# Patient Record
Sex: Male | Born: 1965 | Race: White | Hispanic: No | Smoking: Never smoker
Health system: Southern US, Community
[De-identification: ages and names within clinical notes are randomized; demographics above are authoritative.]

## PROBLEM LIST (undated history)

## (undated) DIAGNOSIS — N189 Chronic kidney disease, unspecified: Secondary | ICD-10-CM

## (undated) DIAGNOSIS — J189 Pneumonia, unspecified organism: Secondary | ICD-10-CM

## (undated) DIAGNOSIS — E119 Type 2 diabetes mellitus without complications: Secondary | ICD-10-CM

## (undated) DIAGNOSIS — E785 Hyperlipidemia, unspecified: Secondary | ICD-10-CM

## (undated) DIAGNOSIS — I10 Essential (primary) hypertension: Secondary | ICD-10-CM

## (undated) DIAGNOSIS — H547 Unspecified visual loss: Secondary | ICD-10-CM

## (undated) DIAGNOSIS — I350 Nonrheumatic aortic (valve) stenosis: Secondary | ICD-10-CM

## (undated) HISTORY — DX: Chronic kidney disease, unspecified: N18.9

## (undated) HISTORY — PX: FRACTURE SURGERY: SHX138

---

## 1985-01-04 HISTORY — PX: ANKLE CLOSED REDUCTION: SHX880

## 2002-05-24 ENCOUNTER — Emergency Department (HOSPITAL_COMMUNITY): Admission: EM | Admit: 2002-05-24 | Discharge: 2002-05-24 | Payer: Self-pay | Admitting: Emergency Medicine

## 2002-05-25 ENCOUNTER — Emergency Department (HOSPITAL_COMMUNITY): Admission: EM | Admit: 2002-05-25 | Discharge: 2002-05-25 | Payer: Self-pay | Admitting: Emergency Medicine

## 2013-01-05 ENCOUNTER — Encounter: Payer: Self-pay | Admitting: Family Medicine

## 2013-01-05 ENCOUNTER — Ambulatory Visit (INDEPENDENT_AMBULATORY_CARE_PROVIDER_SITE_OTHER): Payer: 59 | Admitting: Family Medicine

## 2013-01-05 VITALS — BP 146/90 | Ht 68.5 in

## 2013-01-05 DIAGNOSIS — E785 Hyperlipidemia, unspecified: Secondary | ICD-10-CM | POA: Insufficient documentation

## 2013-01-05 DIAGNOSIS — E118 Type 2 diabetes mellitus with unspecified complications: Secondary | ICD-10-CM | POA: Insufficient documentation

## 2013-01-05 DIAGNOSIS — E782 Mixed hyperlipidemia: Secondary | ICD-10-CM | POA: Insufficient documentation

## 2013-01-05 DIAGNOSIS — IMO0002 Reserved for concepts with insufficient information to code with codable children: Secondary | ICD-10-CM

## 2013-01-05 DIAGNOSIS — IMO0001 Reserved for inherently not codable concepts without codable children: Secondary | ICD-10-CM

## 2013-01-05 DIAGNOSIS — Z0000 Encounter for general adult medical examination without abnormal findings: Secondary | ICD-10-CM

## 2013-01-05 DIAGNOSIS — E119 Type 2 diabetes mellitus without complications: Secondary | ICD-10-CM

## 2013-01-05 DIAGNOSIS — I10 Essential (primary) hypertension: Secondary | ICD-10-CM | POA: Insufficient documentation

## 2013-01-05 DIAGNOSIS — Z23 Encounter for immunization: Secondary | ICD-10-CM

## 2013-01-05 DIAGNOSIS — E1165 Type 2 diabetes mellitus with hyperglycemia: Secondary | ICD-10-CM | POA: Insufficient documentation

## 2013-01-05 MED ORDER — CITALOPRAM HYDROBROMIDE 20 MG PO TABS: 20.0000 mg | ORAL_TABLET | Freq: Every day | ORAL | Status: DC

## 2013-01-05 MED ORDER — LISINOPRIL-HYDROCHLOROTHIAZIDE 20-12.5 MG PO TABS: 1.0000 | ORAL_TABLET | Freq: Every day | ORAL | Status: DC

## 2013-01-05 MED ORDER — METFORMIN HCL 500 MG PO TABS: 500.0000 mg | ORAL_TABLET | Freq: Two times a day (BID) | ORAL | Status: DC

## 2013-01-05 MED ORDER — GLYBURIDE 5 MG PO TABS: 5.0000 mg | ORAL_TABLET | Freq: Two times a day (BID) | ORAL | Status: DC

## 2013-01-05 MED ORDER — CEPHALEXIN 500 MG PO CAPS: 500.0000 mg | ORAL_CAPSULE | Freq: Four times a day (QID) | ORAL | Status: AC

## 2013-01-05 NOTE — Patient Instructions (Signed)
Diabetes Meal Planning Guide The diabetes meal planning guide is a tool to help you plan your meals and snacks. It is important for people with diabetes to manage their blood glucose (sugar) levels. Choosing the right foods and the right amounts throughout your day will help control your blood glucose. Eating right can even help you improve your blood pressure and reach or maintain a healthy weight. CARBOHYDRATE COUNTING MADE EASY When you eat carbohydrates, they turn to sugar. This raises your blood glucose level. Counting carbohydrates can help you control this level so you feel better. When you plan your meals by counting carbohydrates, you can have more flexibility in what you eat and balance your medicine with your food intake. Carbohydrate counting simply means adding up the total amount of carbohydrate grams in your meals and snacks. Try to eat about the same amount at each meal. Foods with carbohydrates are listed below. Each portion below is 1 carbohydrate serving or 15 grams of carbohydrates. Ask your dietician how many grams of carbohydrates you should eat at each meal or snack. Grains and Starches  1 slice bread.   English muffin or hotdog/hamburger bun.   cup cold cereal (unsweetened).   cup cooked pasta or rice.   cup starchy vegetables (corn, potatoes, peas, beans, winter squash).  1 tortilla (6 inches).   bagel.  1 waffle or pancake (size of a CD).   cup cooked cereal.  4 to 6 small crackers. *Whole grain is recommended. Fruit  1 cup fresh unsweetened berries, melon, papaya, pineapple.  1 small fresh fruit.   banana or mango.   cup fruit juice (4 oz unsweetened).   cup canned fruit in natural juice or water.  2 tbs dried fruit.  12 to 15 grapes or cherries. Milk and Yogurt  1 cup fat-free or 1% milk.  1 cup soy milk.  6 oz light yogurt with sugar-free sweetener.  6 oz low-fat soy yogurt.  6 oz plain yogurt. Vegetables  1 cup raw or  cup  cooked is counted as 0 carbohydrates or a "free" food.  If you eat 3 or more servings at 1 meal, count them as 1 carbohydrate serving. Other Carbohydrates   oz chips or pretzels.   cup ice cream or frozen yogurt.   cup sherbet or sorbet.  2 inch square cake, no frosting.  1 tbs honey, sugar, jam, jelly, or syrup.  2 small cookies.  3 squares of graham crackers.  3 cups popcorn.  6 crackers.  1 cup broth-based soup.  Count 1 cup casserole or other mixed foods as 2 carbohydrate servings.  Foods with less than 20 calories in a serving may be counted as 0 carbohydrates or a "free" food. You may want to purchase a book or computer software that lists the carbohydrate gram counts of different foods. In addition, the nutrition facts panel on the labels of the foods you eat are a good source of this information. The label will tell you how big the serving size is and the total number of carbohydrate grams you will be eating per serving. Divide this number by 15 to obtain the number of carbohydrate servings in a portion. Remember, 1 carbohydrate serving equals 15 grams of carbohydrate. SERVING SIZES Measuring foods and serving sizes helps you make sure you are getting the right amount of food. The list below tells how big or small some common serving sizes are.  1 oz.........4 stacked dice.  3 oz.........Deck of cards.  1 tsp........Tip   of little finger.  1 tbs......Marland KitchenMarland KitchenThumb.  2 tbs.......Marland KitchenGolf ball.   cup......Marland KitchenHalf of a fist.  1 cup.......Marland KitchenA fist. SAMPLE DIABETES MEAL PLAN Below is a sample meal plan that includes foods from the grain and starches, dairy, vegetable, fruit, and meat groups. A dietician can individualize a meal plan to fit your calorie needs and tell you the number of servings needed from each food group. However, controlling the total amount of carbohydrates in your meal or snack is more important than making sure you include all of the food groups at every  meal. You may interchange carbohydrate containing foods (dairy, starches, and fruits). The meal plan below is an example of a 2000 calorie diet using carbohydrate counting. This meal plan has 17 carbohydrate servings. Breakfast  1 cup oatmeal (2 carb servings).   cup light yogurt (1 carb serving).  1 cup blueberries (1 carb serving).   cup almonds. Snack  1 large apple (2 carb servings).  1 low-fat string cheese stick. Lunch  Chicken breast salad.  1 cup spinach.   cup chopped tomatoes.  2 oz chicken breast, sliced.  2 tbs low-fat [place] dressing.  12 whole-wheat crackers (2 carb servings).  12 to 15 grapes (1 carb serving).  1 cup low-fat milk (1 carb serving). Snack  1 cup carrots.   cup hummus (1 carb serving). Dinner  3 oz broiled salmon.  1 cup brown rice (3 carb servings). Snack  1  cups steamed broccoli (1 carb serving) drizzled with 1 tsp olive oil and lemon juice.  1 cup light pudding (2 carb servings). DIABETES MEAL PLANNING WORKSHEET Your dietician can use this worksheet to help you decide how many servings of foods and what types of foods are right for you.  BREAKFAST Food Group and Servings / Carb Servings Grain/Starches __________________________________ Dairy __________________________________________ Vegetable ______________________________________ Fruit ___________________________________________ Meat __________________________________________ Fat ____________________________________________ LUNCH Food Group and Servings / Carb Servings Grain/Starches ___________________________________ Dairy ___________________________________________ Fruit ____________________________________________ Meat ___________________________________________ Fat _____________________________________________ Paul Gonzalez Food Group and Servings / Carb Servings Grain/Starches ___________________________________ Dairy  ___________________________________________ Fruit ____________________________________________ Meat ___________________________________________ Fat _____________________________________________ SNACKS Food Group and Servings / Carb Servings Grain/Starches ___________________________________ Dairy ___________________________________________ Vegetable _______________________________________ Fruit ____________________________________________ Meat ___________________________________________ Fat _____________________________________________ DAILY TOTALS Starches _________________________ Vegetable ________________________ Fruit ____________________________ Dairy ____________________________ Meat ____________________________ Fat ______________________________ Document Released: 09/17/2004 Document Revised: 03/15/2011 Document Reviewed: 07/29/2008 [employer] Patient Information 2014 [employer] Diets for Diabetes, Food Labeling Look at food labels to help you decide how much of a product you can eat. You will want to check the amount of total carbohydrate in a serving to see how the food fits into your meal plan. In the list of ingredients, the ingredient present in the largest amount by weight must be listed first, followed by the other ingredients in descending order. STANDARD OF IDENTITY Most products have a list of ingredients. However, foods that the Food and Drug Administration (FDA) has given a standard of identity do not need a list of ingredients. A standard of identity means that a food must contain certain ingredients if it is called a particular name. Examples are mayonnaise, peanut butter, ketchup, jelly, and cheese. LABELING TERMS There are many terms found on food labels. Some of these terms have specific definitions. Some terms are regulated by the FDA, and the FDA has clearly specified how they can be used. Others are not regulated or well-defined and can be misleading and  confusing. SPECIFICALLY DEFINED TERMS Nutritive Sweetener.  A sweetener that contains calories,such as table sugar or  honey. Nonnutritive Sweetener.  A sweetener with few or no calories,such as saccharin, aspartame, sucralose, and cyclamate. LABELING TERMS REGULATED BY THE FDA Free.  The product contains only a tiny or small amount of fat, cholesterol, sodium, sugar, or calories. For example, a "fat-free" product will contain less than 0.5 g of fat per serving. Low.  A food described as "low" in fat, saturated fat, cholesterol, sodium, or calories could be eaten fairly often without exceeding dietary guidelines. For example, "low in fat" means no more than 3 g of fat per serving. Lean.  "Lean" and "extra lean" are U.S. Department of Agriculture Scientist, research (physical sciences)) terms for use on meat and poultry products. "Lean" means the product contains less than 10 g of fat, 4 g of saturated fat, and 95 mg of cholesterol per serving. "Lean" is not as low in fat as a product labeled "low." Extra Lean.  "Extra lean" means the product contains less than 5 g of fat, 2 g of saturated fat, and 95 mg of cholesterol per serving. While "extra lean" has less fat than "lean," it is still higher in fat than a product labeled "low." Reduced, Less, Fewer.  A diet product that contains 25% less of a nutrient or calories than the regular version. For example, hot dogs might be labeled "25% less fat than our regular hot dogs." Light/Lite.  A diet product that contains  fewer calories or  the fat of the original. For example, "light in sodium" means a product with  the usual sodium. More.  One serving contains at least 10% more of the daily value of a vitamin, mineral, or fiber than usual. Good Source Of.  One serving contains 10% to 19% of the daily value for a particular vitamin, mineral, or fiber. Excellent Source Of.  One serving contains 20% or more of the daily value for a particular nutrient. Other terms used might  be "high in" or "rich in." Enriched or Fortified.  The product contains added vitamins, minerals, or protein. Nutrition labeling must be used on enriched or fortified foods. Imitation.  The product has been altered so that it is lower in protein, vitamins, or minerals than the usual food,such as imitation peanut butter. Total Fat.  The number listed is the total of all fat found in a serving of the product. Under total fat, food labels must list saturated fat and trans fat, which are associated with raising bad cholesterol and an increased risk of heart blood vessel disease. Saturated Fat.  Mainly fats from animal-based sources. Some examples are red meat, cheese, cream, whole milk, and coconut oil. Trans Fat.  Found in some fried snack foods, packaged foods, and fried restaurant foods. It is recommended you eat as close to 0 g of trans fat as possible, since it raises bad cholesterol and lowers good cholesterol. Polyunsaturated and Monounsaturated Fats.  More healthful fats. These fats are from plant sources. Total Carbohydrate.  The number of carbohydrate grams in a serving of the product. Under total carbohydrate are listed the other carbohydrate sources, such as dietary fiber and sugars. Dietary Fiber.  A carbohydrate from plant sources. Sugars.  Sugars listed on the label contain all naturally occurring sugars as well as added sugars. LABELING TERMS NOT REGULATED BY THE FDA Sugarless.  Table sugar (sucrose) has not been added. However, the manufacturer may use another form of sugar in place of sucrose to sweeten the product. For example, sugar alcohols are used to sweeten foods. Sugar alcohols are a form  of sugar but are not table sugar. If a product contains sugar alcohols in place of sucrose, it can still be labeled "sugarless." Low Salt, Salt-Free, Unsalted, No Salt, No Salt Added, Without Added Salt.  Food that is usually processed with salt has been made without salt.  However, the food may contain sodium-containing additives, such as preservatives, leavening agents, or flavorings. Natural.  This term has no legal meaning. Organic.  Foods that are certified as organic have been inspected and approved by the USDA to ensure they are produced without pesticides, fertilizers containing synthetic ingredients, bioengineering, or ionizing radiation. Document Released: 12/24/2002 Document Revised: 03/15/2011 Document Reviewed: 07/11/2008 [facility] Patient Information 2014 [place], [place].

## 2013-01-05 NOTE — Progress Notes (Signed)
   Subjective:    Patient ID: Paul Gonzalez, male    DOB: 06-16-1965, 48 y.o.   MRN: [id number]  HPI Patient arrives for an annual physical. The patient comes in today for a wellness visit.  A review of their health history was completed.  A review of medications was also completed. Any necessary refills were discussed. Sensible healthy diet was discussed. Importance of minimizing excessive salt and carbohydrates was also discussed. Safety was stressed including driving, activities at work and at home where applicable. Importance of regular physical activity for overall health was discussed. Preventative measures appropriate for age were discussed. Time was spent with the patient discussing any concerns they have about their well-being. Patient also states he's been off his medicine for several months he relates that his sugars have been running high he does state that he would like to go ahead with getting back on his medicines. He states cost of doctor visits have impeded his ability to come. He denies any other particular troubles currently. He denies any chest tightness pressure pain shortness breath rectal bleeding hematuria.  Review of Systems  Constitutional: Negative for fever, activity change, appetite change and fatigue.  HENT: Negative for congestion and rhinorrhea.   Eyes: Negative for discharge.  Respiratory: Negative for cough and wheezing.   Cardiovascular: Negative for chest pain.  Gastrointestinal: Negative for vomiting, abdominal pain and blood in stool.  Endocrine: Positive for polydipsia. Negative for polyphagia.  Genitourinary: Negative for frequency and difficulty urinating.  Musculoskeletal: Negative for neck pain.  Skin: Negative for rash.  Allergic/Immunologic: Negative for environmental allergies and food allergies.  Neurological: Negative for weakness and headaches.  Psychiatric/Behavioral: Negative for confusion and agitation.       Objective:   Physical Exam  Constitutional: He appears well-developed and well-nourished.  HENT:  Head: Normocephalic and atraumatic.  Right Ear: External ear normal.  Left Ear: External ear normal.  Nose: Nose normal.  Mouth/Throat: Oropharynx is clear and moist.  Eyes: EOM are normal. Pupils are equal, round, and reactive to light.  Neck: Normal range of motion. Neck supple. No thyromegaly present.  Cardiovascular: Normal rate, regular rhythm and normal heart sounds.   No murmur heard. Pulmonary/Chest: Effort normal and breath sounds normal. No respiratory distress. He has no wheezes.  Abdominal: Soft. Bowel sounds are normal. He exhibits no distension and no mass. There is no tenderness.  Genitourinary: Penis normal.  Musculoskeletal: Normal range of motion. He exhibits no edema.  Lymphadenopathy:    He has no cervical adenopathy.  Neurological: He is alert. He exhibits normal muscle tone.  Skin: Skin is warm and dry. No erythema.  Psychiatric: He has a normal mood and affect. His behavior is normal. Judgment normal.          Assessment & Plan:  Wellness-safety measures dietary measures all discussed. The importance of following up for regular health checkups for his diabetes was discussed proper lab work was ordered he was also encouraged to get annual visit with the eye doctor. He was also told that lack of following medications or recommendations plus also lack of followup jeopardizes his long-term health and increases her risk of premature heart disease heart attacks strokes or death. He voiced understanding and agreed to followup in 3 months.

## 2013-01-08 ENCOUNTER — Telehealth: Payer: Self-pay | Admitting: Family Medicine

## 2013-01-08 NOTE — Telephone Encounter (Signed)
Patient needs Rx for glucose meters and strips (Accucheck)-Patient was supposed to have this at last visit.     [facility]

## 2013-01-08 NOTE — Telephone Encounter (Signed)
Med fax and patient notified

## 2013-01-09 ENCOUNTER — Other Ambulatory Visit: Payer: Self-pay

## 2013-01-09 ENCOUNTER — Observation Stay (HOSPITAL_COMMUNITY)
Admission: EM | Admit: 2013-01-09 | Discharge: 2013-01-10 | Disposition: A | Discharge status: Home / Self Care | Payer: 59 | Attending: Internal Medicine | Admitting: Internal Medicine

## 2013-01-09 ENCOUNTER — Encounter (HOSPITAL_COMMUNITY): Payer: Self-pay | Admitting: Emergency Medicine

## 2013-01-09 DIAGNOSIS — I10 Essential (primary) hypertension: Secondary | ICD-10-CM

## 2013-01-09 DIAGNOSIS — E119 Type 2 diabetes mellitus without complications: Secondary | ICD-10-CM

## 2013-01-09 DIAGNOSIS — N179 Acute kidney failure, unspecified: Secondary | ICD-10-CM

## 2013-01-09 DIAGNOSIS — N289 Disorder of kidney and ureter, unspecified: Secondary | ICD-10-CM

## 2013-01-09 DIAGNOSIS — E785 Hyperlipidemia, unspecified: Secondary | ICD-10-CM

## 2013-01-09 DIAGNOSIS — E162 Hypoglycemia, unspecified: Principal | ICD-10-CM

## 2013-01-09 HISTORY — DX: Hyperlipidemia, unspecified: E78.5

## 2013-01-09 HISTORY — DX: Type 2 diabetes mellitus without complications: E11.9

## 2013-01-09 HISTORY — DX: Essential (primary) hypertension: I10

## 2013-01-09 LAB — BASIC METABOLIC PANEL
BUN: 35 mg/dL — AB (ref 6–23)
CO2: 26 meq/L (ref 19–32)
Calcium: 9.4 mg/dL (ref 8.4–10.5)
Chloride: 94 mEq/L — ABNORMAL LOW (ref 96–112)
Creatinine, Ser: 3.02 mg/dL — ABNORMAL HIGH (ref 0.50–1.35)
GFR calc Af Amer: 27 mL/min — ABNORMAL LOW (ref >90)
GFR calc non Af Amer: 23 mL/min — ABNORMAL LOW (ref >90)
GLUCOSE: 91 mg/dL (ref 70–99)
POTASSIUM: 4.2 meq/L (ref 3.7–5.3)
SODIUM: 134 meq/L — AB (ref 137–147)

## 2013-01-09 LAB — CBC WITH DIFFERENTIAL/PLATELET
Basophils Absolute: 0 10*3/uL (ref 0.0–0.1)
Basophils Relative: 0 % (ref 0–1)
Eosinophils Absolute: 0.2 10*3/uL (ref 0.0–0.7)
Eosinophils Relative: 2 % (ref 0–5)
HCT: 31.4 % — ABNORMAL LOW (ref 39.0–52.0)
Hemoglobin: 10.8 g/dL — ABNORMAL LOW (ref 13.0–17.0)
LYMPHS PCT: 8 % — AB (ref 12–46)
Lymphs Abs: 0.7 10*3/uL (ref 0.7–4.0)
MCH: 28.4 pg (ref 26.0–34.0)
MCHC: 34.4 g/dL (ref 30.0–36.0)
MCV: 82.6 fL (ref 78.0–100.0)
MONOS PCT: 9 % (ref 3–12)
Monocytes Absolute: 0.7 10*3/uL (ref 0.1–1.0)
NEUTROS PCT: 81 % — AB (ref 43–77)
Neutro Abs: 6.5 10*3/uL (ref 1.7–7.7)
PLATELETS: 347 10*3/uL (ref 150–400)
RBC: 3.8 MIL/uL — ABNORMAL LOW (ref 4.22–5.81)
RDW: 12.8 % (ref 11.5–15.5)
WBC: 8 10*3/uL (ref 4.0–10.5)

## 2013-01-09 LAB — URINALYSIS W MICROSCOPIC + REFLEX CULTURE
BILIRUBIN URINE: NEGATIVE
Glucose, UA: 100 mg/dL — AB
KETONES UR: NEGATIVE mg/dL
Leukocytes, UA: NEGATIVE
Nitrite: NEGATIVE
PROTEIN: 100 mg/dL — AB
Specific Gravity, Urine: 1.01 (ref 1.005–1.030)
Urobilinogen, UA: 0.2 mg/dL (ref 0.0–1.0)
pH: 6 (ref 5.0–8.0)

## 2013-01-09 LAB — SODIUM, URINE, RANDOM: Sodium, Ur: 30 mEq/L

## 2013-01-09 LAB — GLUCOSE, CAPILLARY
Glucose-Capillary: 62 mg/dL — ABNORMAL LOW (ref 70–99)
Glucose-Capillary: 101 mg/dL — ABNORMAL HIGH (ref 70–99)
Glucose-Capillary: 74 mg/dL (ref 70–99)
Glucose-Capillary: 135 mg/dL — ABNORMAL HIGH (ref 70–99)
Glucose-Capillary: 148 mg/dL — ABNORMAL HIGH (ref 70–99)

## 2013-01-09 LAB — CREATININE, URINE, RANDOM: Creatinine, Urine: 23.19 mg/dL

## 2013-01-09 MED ORDER — ACETAMINOPHEN 325 MG PO TABS: 650.0000 mg | ORAL_TABLET | Freq: Four times a day (QID) | ORAL | Status: DC | PRN

## 2013-01-09 MED ORDER — ACETAMINOPHEN 650 MG RE SUPP: 650.0000 mg | Freq: Four times a day (QID) | RECTAL | Status: DC | PRN

## 2013-01-09 MED ORDER — PNEUMOCOCCAL VAC POLYVALENT 25 MCG/0.5ML IJ INJ
0.5000 mL | INJECTION | INTRAMUSCULAR | Status: AC
  Administered 2013-01-10: 0.5 mL via INTRAMUSCULAR
  Filled 2013-01-09: qty 0.5

## 2013-01-09 MED ORDER — AMLODIPINE BESYLATE 5 MG PO TABS
10.0000 mg | ORAL_TABLET | Freq: Every day | ORAL | Status: DC
  Administered 2013-01-09 – 2013-01-10 (×2): 10 mg via ORAL
  Filled 2013-01-09 (×4): qty 2

## 2013-01-09 MED ORDER — POLYETHYLENE GLYCOL 3350 17 G PO PACK: 17.0000 g | PACK | Freq: Every day | ORAL | Status: DC | PRN

## 2013-01-09 MED ORDER — CLONIDINE HCL 0.1 MG PO TABS
0.1000 mg | ORAL_TABLET | Freq: Four times a day (QID) | ORAL | Status: DC | PRN
  Administered 2013-01-10: 0.1 mg via ORAL
  Filled 2013-01-09: qty 1

## 2013-01-09 MED ORDER — ONDANSETRON HCL 4 MG PO TABS: 4.0000 mg | ORAL_TABLET | Freq: Four times a day (QID) | ORAL | Status: DC | PRN

## 2013-01-09 MED ORDER — INSULIN ASPART 100 UNIT/ML ~~LOC~~ SOLN: 0.0000 [IU] | Freq: Three times a day (TID) | SUBCUTANEOUS | Status: DC

## 2013-01-09 MED ORDER — HYDRALAZINE HCL 25 MG PO TABS
50.0000 mg | ORAL_TABLET | Freq: Three times a day (TID) | ORAL | Status: DC
  Administered 2013-01-09 – 2013-01-10 (×2): 50 mg via ORAL
  Filled 2013-01-09: qty 1
  Filled 2013-01-09 (×2): qty 2
  Filled 2013-01-09 (×5): qty 1

## 2013-01-09 MED ORDER — SODIUM CHLORIDE 0.9 % IV SOLN
INTRAVENOUS | Status: DC
  Administered 2013-01-09: 19:00:00 via INTRAVENOUS

## 2013-01-09 MED ORDER — ONDANSETRON HCL 4 MG/2ML IJ SOLN: 4.0000 mg | Freq: Four times a day (QID) | INTRAMUSCULAR | Status: DC | PRN

## 2013-01-09 MED ORDER — HEPARIN SODIUM (PORCINE) 5000 UNIT/ML IJ SOLN
5000.0000 [IU] | Freq: Three times a day (TID) | INTRAMUSCULAR | Status: DC
  Administered 2013-01-09 – 2013-01-10 (×2): 5000 [IU] via SUBCUTANEOUS
  Filled 2013-01-09 (×2): qty 1

## 2013-01-09 MED ORDER — ATORVASTATIN CALCIUM 10 MG PO TABS
10.0000 mg | ORAL_TABLET | Freq: Every day | ORAL | Status: DC
  Administered 2013-01-09: 10 mg via ORAL
  Filled 2013-01-09 (×3): qty 1

## 2013-01-09 MED ORDER — GUAIFENESIN-DM 100-10 MG/5ML PO SYRP
5.0000 mL | ORAL_SOLUTION | ORAL | Status: DC | PRN
Start: 2013-01-09 — End: 2013-01-10

## 2013-01-09 NOTE — H&P (Signed)
[facility]                                                                                    Patient Demographics  Paul Gonzalez, is a 48 y.o. male  MRN: [id number]   DOB - [birth year]  Admit Date - 01/09/2013  Outpatient Primary MD for the patient is Paul Battiest, MD   With History of -  Past Medical History  Diagnosis Date  . Hypertension   . Diabetes mellitus without complication   . Dyslipidemia 01/09/2013  . DM2 (diabetes mellitus, type 2) 01/09/2013  . HTN (hypertension) 01/09/2013      History reviewed. No pertinent past surgical history.  in for   Chief Complaint  Patient presents with  . Near Syncope  . Hypoglycemia     HPI  Paul Gonzalez  is a 48 y.o. male, with history of type 2 diabetes mellitus, hypertension, dyslipidemia we'll been running low blood sugars for the last few days and saw his PCP a few days ago comes in to the ER after he had an episode of lightheadedness this afternoon, when EMS arrived his sugars were 35, he was brought to the ER where after intervention his sugars improved and is back to his baseline completely symptom-free. He denies any fever chills headache nausea vomiting, no focal weakness, no chest pain palpitations cough phlegm or fever. No body aches. No diarrhea no blood in stool or urine. In the ER his workup was suggested of hypoglycemia initially, poorly controlled blood pressure and acute renal failure. No baseline labs are present in the system. I was called to admit the patient for hyperglycemia, renal insufficiency and poorly controlled blood pressure.    Review of Systems  currently negative review of systems  In addition to the HPI above,   No Fever-chills, No Headache, No changes with Vision or hearing, mild lightheadedness this morning No problems swallowing food or Liquids, No Chest pain, Cough or Shortness of Breath, No Abdominal pain, No Nausea or Vommitting, Bowel movements are regular, No Blood in stool or  Urine, No dysuria, No new skin rashes or bruises, No new joints pains-aches,  No new weakness, tingling, numbness in any extremity, No recent weight gain or loss, No polyuria, polydypsia or polyphagia, No significant Mental Stressors.  A full 10 point Review of Systems was done, except as stated above, all other Review of Systems were negative.   Social History History  Substance Use Topics  . Smoking status: Never Smoker   . Smokeless tobacco: Not on file  . Alcohol Use: No      Family History No history of CAD, possible diabetes mellitus and his parents he's not sure  Prior to Admission medications   Medication Sig Start Date End Date Taking? Authorizing Provider  citalopram (CELEXA) 20 MG tablet Take 20 mg by mouth daily.   Yes Historical Provider, MD  glyBURIDE (DIABETA) 5 MG tablet Take 5 mg by mouth 2 (two) times daily.   Yes Historical Provider, MD  lisinopril-hydrochlorothiazide (PRINZIDE,ZESTORETIC) 20-12.5 MG per tablet Take 1 tablet by mouth daily.   Yes Historical Provider, MD  metFORMIN (GLUCOPHAGE) 500 MG tablet Take by mouth  2 (two) times daily with a meal.   Yes Historical Provider, MD    No Known Allergies  Physical Exam  Vitals  Blood pressure 205/97, pulse 99, temperature 98.4 F (36.9 C), temperature source Oral, resp. rate 18, SpO2 100.00%.   1. General middle-aged white male lying in bed in NAD,    2. Normal affect and insight, Not Suicidal or Homicidal, Awake Alert, Oriented X 3.  3. No F.N deficits, ALL C.Nerves Intact, Strength 5/5 all 4 extremities, Sensation intact all 4 extremities, Plantars down going.  4. Ears and Eyes appear Normal, Conjunctivae clear, PERRLA. Moist Oral Mucosa.  5. Supple Neck, No JVD, No cervical lymphadenopathy appriciated, No Carotid Bruits.  6. Symmetrical Chest wall movement, Good air movement bilaterally, CTAB.  7. RRR, No Gallops, Rubs or Murmurs, No Parasternal Heave.  8. Positive Bowel Sounds, Abdomen  Soft, Non tender, No organomegaly appriciated,No rebound -guarding or rigidity.  9.  No Cyanosis, Normal Skin Turgor, No Skin Rash or Bruise.  10. Good muscle tone,  joints appear normal , no effusions, Normal ROM.  11. No Palpable Lymph Nodes in Neck or Axillae     Data Review  CBC  Recent Labs Lab 01/09/13 1609  WBC 8.0  HGB 10.8*  HCT 31.4*  PLT 347  MCV 82.6  MCH 28.4  MCHC 34.4  RDW 12.8  LYMPHSABS 0.7  MONOABS 0.7  EOSABS 0.2  BASOSABS 0.0   ------------------------------------------------------------------------------------------------------------------  Chemistries   Recent Labs Lab 01/09/13 1609  NA 134*  K 4.2  CL 94*  CO2 26  GLUCOSE 91  BUN 35*  CREATININE 3.02*  CALCIUM 9.4   ------------------------------------------------------------------------------------------------------------------ CrCl is unknown because there is no height on file for the current visit. ------------------------------------------------------------------------------------------------------------------ No results found for this basename: TSH, T4TOTAL, FREET3, T3FREE, THYROIDAB,  in the last 72 hours   Coagulation profile No results found for this basename: INR, PROTIME,  in the last 168 hours ------------------------------------------------------------------------------------------------------------------- No results found for this basename: DDIMER,  in the last 72 hours -------------------------------------------------------------------------------------------------------------------  Cardiac Enzymes No results found for this basename: CK, CKMB, TROPONINI, MYOGLOBIN,  in the last 168 hours ------------------------------------------------------------------------------------------------------------------ No components found with this basename: POCBNP,     ---------------------------------------------------------------------------------------------------------------  Urinalysis No results found for this basename: colorurine, appearanceur, labspec, phurine, glucoseu, hgbur, bilirubinur, ketonesur, proteinur, urobilinogen, nitrite, leukocytesur    ----------------------------------------------------------------------------------------------------------------  Imaging results:   No results found.  My personal review of EKG: Rhythm NSR,  no Acute ST changes    Assessment & Plan    1. Hypoglycemia induced syncopal episode. He takes oral hypoglycemic medications in combination of renal insufficiency likely is acute on chronic he's become hypoglycemic - his oral hypoglycemic medications will be held, will monitor CBGs closely, low-dose sliding scale insulin if needed with meals, check A1c and monitor CBGs.   2. Hypertension. In poor control, mild hypertensive urgency, he currently does not remember his home medications, he potentially could be on an ACE/ARB, will place him on Norvasc, hydralazine along with as needed Catapres and monitor blood pressures.   3. Renal insufficiency. Likely acute on chronic. No baseline labs in the system. Will check UA along with urine electrolytes, IV fluids for hydration repeat BMP in the morning thereafter outpatient followup with PCP. Try to get records from PCPs office.    4. His lipidemia place on Lipitor.     Have requested pharmacy to do med rec now     DVT Prophylaxis Heparin   AM Labs Ordered, also  please review Full Orders  Family Communication: Admission, patients condition and plan of care including tests being ordered have been discussed with the patient  who indicates understanding and agree with the plan and Code Status.  Code Status Full  Likely DC to  Home  Condition Fair  Time spent in minutes : 35    Azlee Monforte K M.D on 01/09/2013 at 6:03 PM  Between 7am to  7pm - Pager - [phone]  After 7pm go to [email or web address] - password [email or web address]  And look for the night coverage person covering me after hours  [facility] Office  [phone]

## 2013-01-09 NOTE — ED Notes (Signed)
Pt comes from work via EMS after near syncopal episode. Pt's blood glucose was 35 upon EMS arrival. Pt was given oral glucose and juice to drink. Pt's glucose was 62 afterward. Pt states he "remembers everything but passed out".

## 2013-01-09 NOTE — ED Notes (Signed)
Pt given grape and orange juice. Dr Rogene Houston notified on CBG.

## 2013-01-09 NOTE — ED Provider Notes (Addendum)
CSN: [id number]     Arrival date & time 01/09/13  1511 History  This chart was scribed for Paul Kung, MD by Donato Schultz, ED Scribe. This patient was seen in room APAH2/APAH2 and the patient's care was started at 3:44 PM.      Chief Complaint  Patient presents with  . Near Syncope  . Hypoglycemia    Patient is a 48 y.o. male presenting with near-syncope and hypoglycemia. The history is provided by the patient. No language interpreter was used.  Near Syncope This is a new problem. The current episode started 1 to 2 hours ago. Pertinent negatives include no chest pain, no abdominal pain, no headaches and no shortness of breath.  Hypoglycemia Duration:  6 hours Timing:  Constant Diabetic status:  Controlled with oral medications Context: not diet changes   Associated symptoms: no dizziness, no shortness of breath, no sweats, no syncope and no vomiting    HPI Comments: Paul Gonzalez is a 48 y.o. male with a history of DM who presents to the Emergency Department complaining of hypoglycemia that started today after the patient was leaving from work.  The patient states that he had a near syncopal episode due to his hypoglycemia which was characterized by a feeling of lightheadedness.  The patient states that he had breakfast and lunch today.  He denies being on any new diet but states that he is trying to modify his eating habits to avoid gaining weight.  The patient denies taking Insulin.  He states that he took his Glucophage yesterday morning because he noticed his blood sugar was low.  He denies taking any Glucophage last night or today.  He denies taking any new medication or OTC herbal supplements.    Past Medical History  Diagnosis Date  . Hypertension   . Diabetes mellitus without complication    History reviewed. No pertinent past surgical history. History reviewed. No pertinent family history. History  Substance Use Topics  . Smoking status: Never Smoker   . Smokeless  tobacco: Not on file  . Alcohol Use: No    Review of Systems  Constitutional: Negative for fever, chills, diaphoresis and fatigue.  HENT: Negative for rhinorrhea.   Eyes: Negative for visual disturbance.  Respiratory: Negative for cough and shortness of breath.   Cardiovascular: Positive for near-syncope. Negative for chest pain, leg swelling and syncope.  Gastrointestinal: Negative for nausea, vomiting, abdominal pain and diarrhea.  Genitourinary: Negative for dysuria and hematuria.  Musculoskeletal: Negative for back pain and neck pain.  Skin: Negative for rash.  Neurological: Positive for light-headedness. Negative for dizziness, syncope, weakness, numbness and headaches.  Hematological: Does not bruise/bleed easily.  Psychiatric/Behavioral: Negative for confusion.  All other systems reviewed and are negative.    Allergies  Review of patient's allergies indicates no known allergies.  Home Medications  No current outpatient prescriptions on file.  Triage Vitals: BP 205/97  Pulse 99  Temp(Src) 98.4 F (36.9 C) (Oral)  Resp 18  SpO2 100%  Physical Exam  Nursing note and vitals reviewed. Constitutional: He is oriented to person, place, and time. He appears well-developed and well-nourished.  HENT:  Head: Normocephalic and atraumatic.  Right Ear: External ear normal.  Left Ear: External ear normal.  Mouth/Throat: Oropharynx is clear and moist.  Eyes: Conjunctivae and EOM are normal. Pupils are equal, round, and reactive to light.  Neck: Normal range of motion and phonation normal. Neck supple.  Cardiovascular: Normal rate, regular rhythm, normal heart sounds and intact  distal pulses.   No murmur heard. Pulmonary/Chest: Effort normal and breath sounds normal. No respiratory distress. He has no wheezes. He has no rales. He exhibits no bony tenderness.  Abdominal: Soft. Normal appearance and bowel sounds are normal. There is no tenderness.  Musculoskeletal: Normal range of  motion. He exhibits no edema.  No pitting edema in ankles.   Neurological: He is alert and oriented to person, place, and time. No cranial nerve deficit or sensory deficit. He exhibits normal muscle tone. Coordination normal.  Skin: Skin is warm, dry and intact.  Psychiatric: He has a normal mood and affect. His behavior is normal. Judgment and thought content normal.    ED Course  Procedures (including critical care time)  DIAGNOSTIC STUDIES: Oxygen Saturation is 100% on room air, normal by my interpretation.    COORDINATION OF CARE: 3:48 PM- Discussed monitoring the patient's blood sugar in the ED.  Discussed possible admission if the patient's blood sugar levels are unchanging or worsening.  The patient agreed to the treatment plan.    Labs Review Labs Reviewed  GLUCOSE, CAPILLARY - Abnormal; Notable for the following:    Glucose-Capillary 62 (*)    All other components within normal limits  CBC WITH DIFFERENTIAL - Abnormal; Notable for the following:    RBC 3.80 (*)    Hemoglobin 10.8 (*)    HCT 31.4 (*)    Neutrophils Relative % 81 (*)    Lymphocytes Relative 8 (*)    All other components within normal limits  BASIC METABOLIC PANEL - Abnormal; Notable for the following:    Sodium 134 (*)    Chloride 94 (*)    BUN 35 (*)    Creatinine, Ser 3.02 (*)    GFR calc non Af Amer 23 (*)    GFR calc Af Amer 27 (*)    All other components within normal limits  GLUCOSE, CAPILLARY - Abnormal; Notable for the following:    Glucose-Capillary 101 (*)    All other components within normal limits  GLUCOSE, CAPILLARY - Abnormal; Notable for the following:    Glucose-Capillary 135 (*)    All other components within normal limits  GLUCOSE, CAPILLARY   Imaging Review No results found.  EKG Interpretation   None      Date: 01/09/2013  Rate: 99  Rhythm: normal sinus rhythm  QRS Axis: normal  Intervals: normal  ST/T Wave abnormalities: nonspecific ST/T changes  Conduction  Disutrbances:none  Narrative Interpretation:   Old EKG Reviewed: none available Supple left atrial enlargement cannot rule out anterior infarct age undetermined. No old EKG for comparison posterior is artifact throughout this EKG.   MDM   1. Hypoglycemia   2. Renal insufficiency    Patient noted that his blood sugars have been running low took his last dose of oral hypoglycemic that yesterday morning today at work got hypoglycemic down to 35 has been eating. Here with some snacks and additional food blood sugar up to 135 and holding in the 100s. Renal function is abnormal creatinine is 3.02 this may explain the prolonged half-life of his oral hypoglycemics and therefore the low blood sugar. Will discuss with the hospitalist whether this requires admission or not. Patient probably could hold his oral hypoglycemics.    Hospitalist team once ago with an observation period we'll also get a pharmacy tech to confirm his home meds.  I personally performed the services described in this documentation, which was scribed in my presence. The recorded information has been  reviewed and is accurate.     Paul Kung, MD 01/09/13 [place], MD 01/09/13 [phone]

## 2013-01-10 ENCOUNTER — Encounter: Payer: Self-pay | Admitting: Family Medicine

## 2013-01-10 LAB — CBC
HCT: 27 % — ABNORMAL LOW (ref 39.0–52.0)
Hemoglobin: 9.5 g/dL — ABNORMAL LOW (ref 13.0–17.0)
MCH: 29.3 pg (ref 26.0–34.0)
MCHC: 35.2 g/dL (ref 30.0–36.0)
MCV: 83.3 fL (ref 78.0–100.0)
PLATELETS: 262 10*3/uL (ref 150–400)
RBC: 3.24 MIL/uL — AB (ref 4.22–5.81)
RDW: 12.8 % (ref 11.5–15.5)
WBC: 6.5 10*3/uL (ref 4.0–10.5)

## 2013-01-10 LAB — GLUCOSE, CAPILLARY
GLUCOSE-CAPILLARY: 84 mg/dL (ref 70–99)
Glucose-Capillary: 88 mg/dL (ref 70–99)
Glucose-Capillary: 120 mg/dL — ABNORMAL HIGH (ref 70–99)

## 2013-01-10 LAB — BASIC METABOLIC PANEL
BUN: 35 mg/dL — ABNORMAL HIGH (ref 6–23)
CALCIUM: 8.8 mg/dL (ref 8.4–10.5)
CO2: 25 mEq/L (ref 19–32)
Chloride: 106 mEq/L (ref 96–112)
Creatinine, Ser: 3 mg/dL — ABNORMAL HIGH (ref 0.50–1.35)
GFR, EST AFRICAN AMERICAN: 27 mL/min — AB (ref >90)
GFR, EST NON AFRICAN AMERICAN: 23 mL/min — AB (ref >90)
GLUCOSE: 86 mg/dL (ref 70–99)
Potassium: 3.8 mEq/L (ref 3.7–5.3)
SODIUM: 142 meq/L (ref 137–147)

## 2013-01-10 LAB — HEMOGLOBIN A1C
Hgb A1c MFr Bld: 9.3 % — ABNORMAL HIGH (ref <5.7)
MEAN PLASMA GLUCOSE: 220 mg/dL — AB (ref <117)

## 2013-01-10 LAB — OSMOLALITY: Osmolality: 286 mOsm/kg (ref 275–300)

## 2013-01-10 LAB — OSMOLALITY, URINE: OSMOLALITY UR: 138 mosm/kg — AB (ref 390–1090)

## 2013-01-10 MED ORDER — CARVEDILOL 3.125 MG PO TABS
6.2500 mg | ORAL_TABLET | Freq: Two times a day (BID) | ORAL | Status: DC
  Administered 2013-01-10: 6.25 mg via ORAL
  Filled 2013-01-10: qty 2

## 2013-01-10 MED ORDER — AMLODIPINE BESYLATE 10 MG PO TABS: 10.0000 mg | ORAL_TABLET | Freq: Every day | ORAL | Status: DC

## 2013-01-10 MED ORDER — HYDRALAZINE HCL 50 MG PO TABS: 50.0000 mg | ORAL_TABLET | Freq: Three times a day (TID) | ORAL | Status: DC

## 2013-01-10 MED ORDER — CARVEDILOL 6.25 MG PO TABS: 6.2500 mg | ORAL_TABLET | Freq: Two times a day (BID) | ORAL | Status: DC

## 2013-01-10 NOTE — Discharge Instructions (Signed)
Follow with Primary MD [name], MD in 2 days   Get CBC, CMP, checked 2 days by Primary MD and again as instructed by your Primary MD.   Accuchecks 4 times/day, Once in AM empty stomach and then before each meal. Log in all results and show them to your Prim.MD in 2 days. If any glucose reading is under 80 or above 300 call your Prim MD immidiately. Follow Low glucose instructions for glucose under 80 as instructed.   Activity: As tolerated with Full fall precautions use walker/cane & assistance as needed   Disposition Home    Diet:  Heart healthy - low carb  Check your Weight same time everyday, if you gain over 2 pounds, or you develop in leg swelling, experience more shortness of breath or chest pain, call your Primary MD immediately. Follow Cardiac Low Salt Diet and 1.8 lit/day fluid restriction.   On your next visit with her primary care physician please Get Medicines reviewed and adjusted.  Please request your Prim.MD to go over all Hospital Tests and Procedure/Radiological results at the follow up, please get all Hospital records sent to your Prim MD by signing hospital release before you go home.   If you experience worsening of your admission symptoms, develop shortness of breath, life threatening emergency, suicidal or homicidal thoughts you must seek medical attention immediately by calling 911 or calling your MD immediately  if symptoms less severe.  You Must read complete instructions/literature along with all the possible adverse reactions/side effects for all the Medicines you take and that have been prescribed to you. Take any new Medicines after you have completely understood and accpet all the possible adverse reactions/side effects.   Do not drive and provide baby sitting services if your were admitted for syncope or siezures until you have seen by Primary MD or a Neurologist and advised to do so again.  Do not drive when taking Pain medications.    Do not take  more than prescribed Pain, Sleep and Anxiety Medications  Special Instructions: If you have smoked or chewed Tobacco  in the last 2 yrs please stop smoking, stop any regular Alcohol  and or any Recreational drug use.  Wear Seat belts while driving.   Please note  You were cared for by a hospitalist during your hospital stay. If you have any questions about your discharge medications or the care you received while you were in the hospital after you are discharged, you can call the unit and asked to speak with the hospitalist on call if the hospitalist that took care of you is not available. Once you are discharged, your primary care physician will handle any further medical issues. Please note that NO REFILLS for any discharge medications will be authorized once you are discharged, as it is imperative that you return to your primary care physician (or establish a relationship with a primary care physician if you do not have one) for your aftercare needs so that they can reassess your need for medications and monitor your lab values.

## 2013-01-10 NOTE — Discharge Summary (Signed)
[facility]                                                                                   Paul Gonzalez, is a 48 y.o. male  DOB [birth year]  MRN [id number].  Admission date:  01/09/2013  Admitting Physician  Thurnell Lose, MD  Discharge Date:  01/10/2013   Primary MD  [name], MD  Recommendations for primary care physician for things to follow:   Monitor blood pressure, glycemic control, BMP closely   Admission Diagnosis  HTN (hypertension) [401.9] ARF (acute renal failure) [584.9] Hypoglycemia [251.2] Renal insufficiency [593.9] DM2 (diabetes mellitus, type 2) [250.00]  Discharge Diagnosis  hypoglycemia, chronic kidney disease stage IV, diabetes mellitus type 2, poorly controlled blood pressure, noncompliance with medications and lab work at PCP office  Principal Problem:   Hypoglycemia Active Problems:   DM2 (diabetes mellitus, type 2)   ARF (acute renal failure)   HTN (hypertension)   Dyslipidemia      Past Medical History  Diagnosis Date  . Hypertension   . Diabetes mellitus without complication   . Dyslipidemia 01/09/2013  . DM2 (diabetes mellitus, type 2) 01/09/2013  . HTN (hypertension) 01/09/2013    History reviewed. No pertinent past surgical history.   Discharge Condition: Stable   Follow-up Information   Follow up with [name], MD. Schedule an appointment as soon as possible for a visit in 2 days.   Specialty:  Family Medicine   Contact information:   [address] [place] [address] [phone]       Follow up with [facility] S, MD. Schedule an appointment as soon as possible for a visit in 1 week.   Specialty:  Nephrology   Contact information:   [address] [place] [address] [phone]         Consults obtained -    Discharge Medications      Medication List    STOP taking these medications       glyBURIDE 5 MG tablet  Commonly known as:  DIABETA      lisinopril-hydrochlorothiazide 20-12.5 MG per tablet  Commonly known as:  PRINZIDE,ZESTORETIC     metFORMIN 500 MG tablet  Commonly known as:  GLUCOPHAGE      TAKE these medications       amLODipine 10 MG tablet  Commonly known as:  NORVASC  Take 1 tablet (10 mg total) by mouth daily.     carvedilol 6.25 MG tablet  Commonly known as:  COREG  Take 1 tablet (6.25 mg total) by mouth 2 (two) times daily with a meal.     citalopram 20 MG tablet  Commonly known as:  CELEXA  Take 20 mg by mouth daily.     hydrALAZINE 50 MG tablet  Commonly known as:  APRESOLINE  Take 1 tablet (50 mg total) by mouth every 8 (eight) hours.         Diet and Activity recommendation: See Discharge Instructions below   Discharge Instructions     Follow with Primary MD [name], MD in 2 days   Get CBC, CMP, checked 2 days by Primary MD and again  as instructed by your Primary MD.   Accuchecks 4 times/day, Once in AM empty stomach and then before each meal. Log in all results and show them to your Prim.MD in 2 days. If any glucose reading is under 80 or above 300 call your Prim MD immidiately. Follow Low glucose instructions for glucose under 80 as instructed.   Activity: As tolerated with Full fall precautions use walker/cane & assistance as needed   Disposition Home    Diet:  Heart healthy - low carb  Check your Weight same time everyday, if you gain over 2 pounds, or you develop in leg swelling, experience more shortness of breath or chest pain, call your Primary MD immediately. Follow Cardiac Low Salt Diet and 1.8 lit/day fluid restriction.   On your next visit with her primary care physician please Get Medicines reviewed and adjusted.  Please request your Prim.MD to go over all Hospital Tests and Procedure/Radiological results at the follow up, please get all Hospital records sent to your Prim MD by signing hospital release before you go home.   If you experience worsening of your  admission symptoms, develop shortness of breath, life threatening emergency, suicidal or homicidal thoughts you must seek medical attention immediately by calling 911 or calling your MD immediately  if symptoms less severe.  You Must read complete instructions/literature along with all the possible adverse reactions/side effects for all the Medicines you take and that have been prescribed to you. Take any new Medicines after you have completely understood and accpet all the possible adverse reactions/side effects.   Do not drive and provide baby sitting services if your were admitted for syncope or siezures until you have seen by Primary MD or a Neurologist and advised to do so again.  Do not drive when taking Pain medications.    Do not take more than prescribed Pain, Sleep and Anxiety Medications  Special Instructions: If you have smoked or chewed Tobacco  in the last 2 yrs please stop smoking, stop any regular Alcohol  and or any Recreational drug use.  Wear Seat belts while driving.   Please note  You were cared for by a hospitalist during your hospital stay. If you have any questions about your discharge medications or the care you received while you were in the hospital after you are discharged, you can call the unit and asked to speak with the hospitalist on call if the hospitalist that took care of you is not available. Once you are discharged, your primary care physician will handle any further medical issues. Please note that NO REFILLS for any discharge medications will be authorized once you are discharged, as it is imperative that you return to your primary care physician (or establish a relationship with a primary care physician if you do not have one) for your aftercare needs so that they can reassess your need for medications and monitor your lab values.     Major procedures and Radiology Reports - PLEASE review detailed and final reports for all details, in brief -        No results found.  Micro Results      No results found for this or any previous visit (from the past 240 hour(s)).   History of present illness and  Hospital Course:     Kindly see H&P for history of present illness and admission details, please review complete Labs, Consult reports and Test reports for all details in brief Kayston Burkart, is a 48 y.o. male,  patient with history of diabetes mellitus type 2, hypertension, chronic kidney disease now stage 4-5, persistently noncompliant with lab work and medications at PCP office this was discussed with his PCP by me in detail today.  Patient with above history was admitted to the hospital after a syncopal episode caused by severe hypoglycemia, off note he is diabetic and is on oral hypoglycemics, his last blood work at PCP office was about 4 years ago showing a creatinine of 1.5, since then according to his PCP he has been very noncompliant with lab work and never got any further lab work done, he was noncompliant with his medications an intermittent basis, he now came to the ER hypoglycemic with a creatinine of 3, I think he has developed gradual renal insufficiency and now with his present renal function his previous hypoglycemic agents caused hypoglycemia.   He was treated here with holding of his oral hypoglycemics with good results, he is normoglycemic now, A1c is pending, he will be discharged home on no diabetic medications, I have adjusted his blood pressure medications for better blood pressure control, I have discontinued his lisinopril HCTZ combination in the light of renal insufficiency, after 2 L of IV fluids for hydration his renal function stays at 3 which I think is probably his new baseline. I have discussed his care with his PCP personally who will see him in the next 1-2 days, he will also follow with nephrologist in the outpatient setting within a week. This was also discussed with patient in detail it was emphasized that he  should stay compliant as he is definitely heading towards dialysis have it does not stay compliant may cause death and disability.         Today   Subjective:   Paul Gonzalez today has no headache,no chest abdominal pain,no new weakness tingling or numbness, feels much better wants to go home today.    Objective:   Blood pressure 188/81, pulse 85, temperature 97.9 F (36.6 C), temperature source Oral, resp. rate 18, height 5\' 10"  (1.778 m), weight 78.971 kg (174 lb 1.6 oz), SpO2 99.00%.   Intake/Output Summary (Last 24 hours) at 01/10/13 0948 Last data filed at 01/10/13 0908  Gross per 24 hour  Intake    240 ml  Output   1100 ml  Net   -860 ml    Exam Awake Alert, Oriented *3, No new F.N deficits, Normal affect [place].AT,PERRAL Supple Neck,No JVD, No cervical lymphadenopathy appriciated.  Symmetrical Chest wall movement, Good air movement bilaterally, CTAB RRR,No Gallops,Rubs or new Murmurs, No Parasternal Heave +ve B.Sounds, Abd Soft, Non tender, No organomegaly appriciated, No rebound -guarding or rigidity. No Cyanosis, Clubbing or edema, No new Rash or bruise  Data Review   CBC w Diff: Lab Results  Component Value Date   WBC 6.5 01/10/2013   HGB 9.5* 01/10/2013   HCT 27.0* 01/10/2013   PLT 262 01/10/2013   LYMPHOPCT 8* 01/09/2013   MONOPCT 9 01/09/2013   EOSPCT 2 01/09/2013   BASOPCT 0 01/09/2013    CMP: Lab Results  Component Value Date   NA 142 01/10/2013   K 3.8 01/10/2013   CL 106 01/10/2013   CO2 25 01/10/2013   BUN 35* 01/10/2013   CREATININE 3.00* 01/10/2013  . No results found for this basename: HGBA1C     Total Time in preparing paper work, data evaluation and todays exam - 35 minutes  Thurnell Lose M.D on 01/10/2013 at 9:48 AM  [place]  [phone]    

## 2013-01-15 ENCOUNTER — Ambulatory Visit (INDEPENDENT_AMBULATORY_CARE_PROVIDER_SITE_OTHER): Payer: 59 | Admitting: Family Medicine

## 2013-01-15 ENCOUNTER — Encounter: Payer: Self-pay | Admitting: Family Medicine

## 2013-01-15 VITALS — BP 122/78 | Ht 68.5 in | Wt 179.8 lb

## 2013-01-15 DIAGNOSIS — IMO0001 Reserved for inherently not codable concepts without codable children: Secondary | ICD-10-CM

## 2013-01-15 DIAGNOSIS — E1165 Type 2 diabetes mellitus with hyperglycemia: Secondary | ICD-10-CM

## 2013-01-15 DIAGNOSIS — IMO0002 Reserved for concepts with insufficient information to code with codable children: Secondary | ICD-10-CM

## 2013-01-15 DIAGNOSIS — E785 Hyperlipidemia, unspecified: Secondary | ICD-10-CM

## 2013-01-15 DIAGNOSIS — N179 Acute kidney failure, unspecified: Secondary | ICD-10-CM

## 2013-01-15 MED ORDER — INSULIN NPH (HUMAN) (ISOPHANE) 100 UNIT/ML ~~LOC~~ SUSP: 10.0000 [IU] | Freq: Every day | SUBCUTANEOUS | Status: DC

## 2013-01-15 NOTE — Progress Notes (Signed)
   Subjective:    Patient ID: Paul Gonzalez, male    DOB: 1965/07/18, 48 y.o.   MRN: [id number]  HPI Patient arrives for a follow up from hospital for syncope related to hypoglycemia.  Patient patient has not checked his sugars since discharge from the hospital. He was advised to check them 4 times per day.   Patient admits to noncompliance with diet and exercise until recently.  Patient had gone quite a few months without taking any diabetes medicine at all.  Patient has a history of profound noncompliance, and has failed on numerous occasions to get blood work is requested since 2010.  Patient states he has no prescription for his machine.  Patient's family notes that that the  $ whatdeductible is 3000 and he cannot afford any other interventions  Review of Systems  no further loss of consciousness no chest pain no headache     Objective:   Physical Exam   alert no apparent distress. Vitals stable. Lungs clear. Heart regular in rhythm. H&T normal.      Assessment & Plan:   Impression #1 type 2 diabetes very poor control. Noncompliance has been tremendous down through the years. #2 renal insufficiency likely a chronic component. Followup blood work still showed a creatinine of 3. Patient needs a nephrologist. Patient was advised he is facing dialysis if the numbers continue to rise. #3 hypertension stable at this time plan initiate NPH insulin 10 units each evening for now. Add 5 units in a week if still poor control. Check sugars each morning. WSL

## 2013-01-15 NOTE — Patient Instructions (Signed)
You desperately need a kidney specialist and a diabetes specialist. Be sure to keep your appointments!!!!!!!!!!!!!!!!!!!!!!!!!!!!!!!!!!!!!!!!!!!!!!!!!  Start nph insulin ten units each eve  If after one week, your fasting sugars are still above 150, add five more units

## 2013-03-02 ENCOUNTER — Inpatient Hospital Stay (HOSPITAL_COMMUNITY)
Admission: EM | Admit: 2013-03-02 | Discharge: 2013-03-07 | DRG: 964 | Disposition: A | Discharge status: Home / Self Care | Payer: 59 | Attending: Surgery | Admitting: Surgery

## 2013-03-02 ENCOUNTER — Emergency Department (HOSPITAL_COMMUNITY): Payer: 59

## 2013-03-02 ENCOUNTER — Encounter (HOSPITAL_COMMUNITY): Payer: Self-pay | Admitting: Emergency Medicine

## 2013-03-02 DIAGNOSIS — D62 Acute posthemorrhagic anemia: Secondary | ICD-10-CM

## 2013-03-02 DIAGNOSIS — S36039A Unspecified laceration of spleen, initial encounter: Secondary | ICD-10-CM

## 2013-03-02 DIAGNOSIS — Z9119 Patient's noncompliance with other medical treatment and regimen: Secondary | ICD-10-CM

## 2013-03-02 DIAGNOSIS — I129 Hypertensive chronic kidney disease with stage 1 through stage 4 chronic kidney disease, or unspecified chronic kidney disease: Secondary | ICD-10-CM | POA: Diagnosis present

## 2013-03-02 DIAGNOSIS — S27329A Contusion of lung, unspecified, initial encounter: Principal | ICD-10-CM

## 2013-03-02 DIAGNOSIS — S2239XA Fracture of one rib, unspecified side, initial encounter for closed fracture: Secondary | ICD-10-CM | POA: Diagnosis present

## 2013-03-02 DIAGNOSIS — S3609XA Other injury of spleen, initial encounter: Secondary | ICD-10-CM | POA: Diagnosis present

## 2013-03-02 DIAGNOSIS — D638 Anemia in other chronic diseases classified elsewhere: Secondary | ICD-10-CM | POA: Diagnosis present

## 2013-03-02 DIAGNOSIS — R0902 Hypoxemia: Secondary | ICD-10-CM | POA: Diagnosis present

## 2013-03-02 DIAGNOSIS — S2220XA Unspecified fracture of sternum, initial encounter for closed fracture: Secondary | ICD-10-CM

## 2013-03-02 DIAGNOSIS — Z794 Long term (current) use of insulin: Secondary | ICD-10-CM

## 2013-03-02 DIAGNOSIS — E785 Hyperlipidemia, unspecified: Secondary | ICD-10-CM | POA: Diagnosis present

## 2013-03-02 DIAGNOSIS — Z79899 Other long term (current) drug therapy: Secondary | ICD-10-CM

## 2013-03-02 DIAGNOSIS — T07XXXA Unspecified multiple injuries, initial encounter: Secondary | ICD-10-CM | POA: Diagnosis present

## 2013-03-02 DIAGNOSIS — E119 Type 2 diabetes mellitus without complications: Secondary | ICD-10-CM | POA: Diagnosis present

## 2013-03-02 DIAGNOSIS — Z91199 Patient's noncompliance with other medical treatment and regimen due to unspecified reason: Secondary | ICD-10-CM

## 2013-03-02 DIAGNOSIS — N184 Chronic kidney disease, stage 4 (severe): Secondary | ICD-10-CM | POA: Diagnosis present

## 2013-03-02 DIAGNOSIS — J90 Pleural effusion, not elsewhere classified: Secondary | ICD-10-CM

## 2013-03-02 LAB — CBC
HEMATOCRIT: 27.8 % — AB (ref 39.0–52.0)
HEMOGLOBIN: 9.6 g/dL — AB (ref 13.0–17.0)
MCH: 28.5 pg (ref 26.0–34.0)
MCHC: 34.5 g/dL (ref 30.0–36.0)
MCV: 82.5 fL (ref 78.0–100.0)
PLATELETS: 272 10*3/uL (ref 150–400)
RBC: 3.37 MIL/uL — ABNORMAL LOW (ref 4.22–5.81)
RDW: 13.6 % (ref 11.5–15.5)
WBC: 10.3 10*3/uL (ref 4.0–10.5)

## 2013-03-02 LAB — COMPREHENSIVE METABOLIC PANEL
ALBUMIN: 3.5 g/dL (ref 3.5–5.2)
ALT: 37 U/L (ref 0–53)
AST: 35 U/L (ref 0–37)
Alkaline Phosphatase: 98 U/L (ref 39–117)
BILIRUBIN TOTAL: 0.3 mg/dL (ref 0.3–1.2)
BUN: 50 mg/dL — ABNORMAL HIGH (ref 6–23)
CHLORIDE: 104 meq/L (ref 96–112)
CO2: 23 mEq/L (ref 19–32)
Calcium: 9.2 mg/dL (ref 8.4–10.5)
Creatinine, Ser: 3.24 mg/dL — ABNORMAL HIGH (ref 0.50–1.35)
GFR calc Af Amer: 25 mL/min — ABNORMAL LOW (ref >90)
GFR calc non Af Amer: 21 mL/min — ABNORMAL LOW (ref >90)
Glucose, Bld: 99 mg/dL (ref 70–99)
Potassium: 4.4 mEq/L (ref 3.7–5.3)
Sodium: 141 mEq/L (ref 137–147)
Total Protein: 6.8 g/dL (ref 6.0–8.3)

## 2013-03-02 LAB — PROTIME-INR
INR: 1.16 (ref 0.00–1.49)
PROTHROMBIN TIME: 14.6 s (ref 11.6–15.2)

## 2013-03-02 LAB — LIPASE, BLOOD: Lipase: 19 U/L (ref 11–59)

## 2013-03-02 LAB — GLUCOSE, CAPILLARY: GLUCOSE-CAPILLARY: 93 mg/dL (ref 70–99)

## 2013-03-02 LAB — CBG MONITORING, ED
GLUCOSE-CAPILLARY: 89 mg/dL (ref 70–99)
GLUCOSE-CAPILLARY: 90 mg/dL (ref 70–99)
Glucose-Capillary: 89 mg/dL (ref 70–99)

## 2013-03-02 LAB — CDS SEROLOGY

## 2013-03-02 MED ORDER — FUROSEMIDE 40 MG PO TABS
40.0000 mg | ORAL_TABLET | Freq: Two times a day (BID) | ORAL | Status: DC
  Administered 2013-03-03 – 2013-03-07 (×9): 40 mg via ORAL
  Filled 2013-03-02 (×13): qty 1

## 2013-03-02 MED ORDER — HYDROMORPHONE HCL PF 1 MG/ML IJ SOLN
1.0000 mg | INTRAMUSCULAR | Status: DC | PRN
  Administered 2013-03-05 (×2): 1 mg via INTRAVENOUS
  Filled 2013-03-02 (×3): qty 1

## 2013-03-02 MED ORDER — INSULIN ASPART 100 UNIT/ML ~~LOC~~ SOLN
0.0000 [IU] | SUBCUTANEOUS | Status: DC
  Administered 2013-03-04: 1 [IU] via SUBCUTANEOUS
  Administered 2013-03-04: 2 [IU] via SUBCUTANEOUS
  Administered 2013-03-04: 1 [IU] via SUBCUTANEOUS
  Administered 2013-03-04: 2 [IU] via SUBCUTANEOUS
  Administered 2013-03-04 – 2013-03-05 (×3): 1 [IU] via SUBCUTANEOUS

## 2013-03-02 MED ORDER — MORPHINE SULFATE 2 MG/ML IJ SOLN: 1.0000 mg | INTRAMUSCULAR | Status: DC | PRN

## 2013-03-02 MED ORDER — CARVEDILOL 6.25 MG PO TABS
6.2500 mg | ORAL_TABLET | Freq: Two times a day (BID) | ORAL | Status: DC
  Administered 2013-03-03 – 2013-03-07 (×9): 6.25 mg via ORAL
  Filled 2013-03-02 (×13): qty 1

## 2013-03-02 MED ORDER — SODIUM CHLORIDE 0.9 % IV SOLN
INTRAVENOUS | Status: DC
  Administered 2013-03-03: 08:00:00 via INTRAVENOUS

## 2013-03-02 MED ORDER — ONDANSETRON HCL 4 MG PO TABS: 4.0000 mg | ORAL_TABLET | Freq: Four times a day (QID) | ORAL | Status: DC | PRN

## 2013-03-02 MED ORDER — ONDANSETRON HCL 4 MG/2ML IJ SOLN
4.0000 mg | Freq: Once | INTRAMUSCULAR | Status: AC
  Administered 2013-03-02: 4 mg via INTRAVENOUS
  Filled 2013-03-02: qty 2

## 2013-03-02 MED ORDER — CITALOPRAM HYDROBROMIDE 20 MG PO TABS
20.0000 mg | ORAL_TABLET | Freq: Every day | ORAL | Status: DC
  Administered 2013-03-03 – 2013-03-07 (×5): 20 mg via ORAL
  Filled 2013-03-02 (×5): qty 1

## 2013-03-02 MED ORDER — HYDROMORPHONE HCL PF 1 MG/ML IJ SOLN
1.0000 mg | INTRAMUSCULAR | Status: DC | PRN
  Administered 2013-03-02 – 2013-03-03 (×7): 1 mg via INTRAVENOUS
  Filled 2013-03-02 (×6): qty 1

## 2013-03-02 MED ORDER — MORPHINE SULFATE 4 MG/ML IJ SOLN: 4.0000 mg | INTRAMUSCULAR | Status: DC | PRN

## 2013-03-02 MED ORDER — PANTOPRAZOLE SODIUM 40 MG IV SOLR
40.0000 mg | Freq: Every day | INTRAVENOUS | Status: DC
  Filled 2013-03-02 (×4): qty 40

## 2013-03-02 MED ORDER — MORPHINE SULFATE 2 MG/ML IJ SOLN
2.0000 mg | INTRAMUSCULAR | Status: DC | PRN
  Administered 2013-03-05: 2 mg via INTRAVENOUS
  Filled 2013-03-02: qty 1

## 2013-03-02 MED ORDER — HYDRALAZINE HCL 50 MG PO TABS
50.0000 mg | ORAL_TABLET | Freq: Three times a day (TID) | ORAL | Status: DC
  Administered 2013-03-02 – 2013-03-07 (×15): 50 mg via ORAL
  Filled 2013-03-02 (×19): qty 1

## 2013-03-02 MED ORDER — SODIUM CHLORIDE 0.9 % IV BOLUS (SEPSIS)
1000.0000 mL | Freq: Once | INTRAVENOUS | Status: AC
  Administered 2013-03-02: 1000 mL via INTRAVENOUS

## 2013-03-02 MED ORDER — AMLODIPINE BESYLATE 10 MG PO TABS
10.0000 mg | ORAL_TABLET | Freq: Every day | ORAL | Status: DC
  Administered 2013-03-03 – 2013-03-07 (×5): 10 mg via ORAL
  Filled 2013-03-02 (×5): qty 1

## 2013-03-02 MED ORDER — PANTOPRAZOLE SODIUM 40 MG PO TBEC
40.0000 mg | DELAYED_RELEASE_TABLET | Freq: Every day | ORAL | Status: DC
  Administered 2013-03-03 – 2013-03-07 (×5): 40 mg via ORAL
  Filled 2013-03-02 (×5): qty 1

## 2013-03-02 MED ORDER — ONDANSETRON HCL 4 MG/2ML IJ SOLN: 4.0000 mg | Freq: Four times a day (QID) | INTRAMUSCULAR | Status: DC | PRN

## 2013-03-02 MED ORDER — MORPHINE SULFATE 4 MG/ML IJ SOLN: 4.0000 mg | Freq: Once | INTRAMUSCULAR | Status: DC
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
## 2013-03-02 NOTE — ED Notes (Signed)
Report given to rn  0n 3100

## 2013-03-02 NOTE — ED Notes (Signed)
Pt alert c/o chest pain still.  c-t scans not completed yet.  Back log there

## 2013-03-02 NOTE — ED Notes (Signed)
The pt just returned from c-t. CBG (last 3)   Recent Labs  03/02/13 1933 03/02/13 1952  GLUCAP 89 89    checked
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
## 2013-03-02 NOTE — ED Notes (Signed)
Pt to ct 

## 2013-03-02 NOTE — ED Notes (Signed)
The pts c-collar had been removed.  The pt reports that the doctor took it off.  The pt is still c/o chest pain
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
## 2013-03-02 NOTE — ED Notes (Signed)
Dr. Jeneen Rinks at bedside doing FAST exam
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
## 2013-03-02 NOTE — ED Notes (Signed)
The pt  Is alert still.  C/o pain

## 2013-03-03 ENCOUNTER — Inpatient Hospital Stay (HOSPITAL_COMMUNITY): Payer: 59

## 2013-03-03 LAB — CBC
HCT: 21.8 % — ABNORMAL LOW (ref 39.0–52.0)
HCT: 21.8 % — ABNORMAL LOW (ref 39.0–52.0)
HEMATOCRIT: 22.4 % — AB (ref 39.0–52.0)
HEMATOCRIT: 23.9 % — AB (ref 39.0–52.0)
HEMOGLOBIN: 7.6 g/dL — AB (ref 13.0–17.0)
HEMOGLOBIN: 7.3 g/dL — AB (ref 13.0–17.0)
Hemoglobin: 7.6 g/dL — ABNORMAL LOW (ref 13.0–17.0)
Hemoglobin: 8 g/dL — ABNORMAL LOW (ref 13.0–17.0)
MCH: 29.2 pg (ref 26.0–34.0)
MCH: 28.7 pg (ref 26.0–34.0)
MCH: 28.4 pg (ref 26.0–34.0)
MCH: 28.3 pg (ref 26.0–34.0)
MCHC: 34.9 g/dL (ref 30.0–36.0)
MCHC: 33.9 g/dL (ref 30.0–36.0)
MCHC: 33.5 g/dL (ref 30.0–36.0)
MCHC: 33.5 g/dL (ref 30.0–36.0)
MCV: 83.8 fL (ref 78.0–100.0)
MCV: 84.5 fL (ref 78.0–100.0)
MCV: 84.8 fL (ref 78.0–100.0)
MCV: 84.5 fL (ref 78.0–100.0)
PLATELETS: 216 10*3/uL (ref 150–400)
PLATELETS: 247 10*3/uL (ref 150–400)
PLATELETS: 210 10*3/uL (ref 150–400)
Platelets: 196 10*3/uL (ref 150–400)
RBC: 2.6 MIL/uL — ABNORMAL LOW (ref 4.22–5.81)
RBC: 2.65 MIL/uL — AB (ref 4.22–5.81)
RBC: 2.82 MIL/uL — ABNORMAL LOW (ref 4.22–5.81)
RBC: 2.58 MIL/uL — ABNORMAL LOW (ref 4.22–5.81)
RDW: 13.8 % (ref 11.5–15.5)
RDW: 13.8 % (ref 11.5–15.5)
RDW: 13.7 % (ref 11.5–15.5)
RDW: 13.7 % (ref 11.5–15.5)
WBC: 6.2 10*3/uL (ref 4.0–10.5)
WBC: 6.5 10*3/uL (ref 4.0–10.5)
WBC: 7.5 10*3/uL (ref 4.0–10.5)
WBC: 7 10*3/uL (ref 4.0–10.5)

## 2013-03-03 LAB — GLUCOSE, CAPILLARY
GLUCOSE-CAPILLARY: 104 mg/dL — AB (ref 70–99)
GLUCOSE-CAPILLARY: 97 mg/dL (ref 70–99)
Glucose-Capillary: 77 mg/dL (ref 70–99)
Glucose-Capillary: 68 mg/dL — ABNORMAL LOW (ref 70–99)
Glucose-Capillary: 93 mg/dL (ref 70–99)
Glucose-Capillary: 103 mg/dL — ABNORMAL HIGH (ref 70–99)

## 2013-03-03 LAB — URINALYSIS, ROUTINE W REFLEX MICROSCOPIC
BILIRUBIN URINE: NEGATIVE
GLUCOSE, UA: NEGATIVE mg/dL
Ketones, ur: 15 mg/dL — AB
Leukocytes, UA: NEGATIVE
Nitrite: NEGATIVE
PH: 5 (ref 5.0–8.0)
Protein, ur: >300 mg/dL — AB
SPECIFIC GRAVITY, URINE: 1.016 (ref 1.005–1.030)
Urobilinogen, UA: 0.2 mg/dL (ref 0.0–1.0)

## 2013-03-03 LAB — URINE MICROSCOPIC-ADD ON

## 2013-03-03 LAB — BASIC METABOLIC PANEL
BUN: 46 mg/dL — AB (ref 6–23)
CHLORIDE: 111 meq/L (ref 96–112)
CO2: 22 meq/L (ref 19–32)
Calcium: 8.4 mg/dL (ref 8.4–10.5)
Creatinine, Ser: 3.08 mg/dL — ABNORMAL HIGH (ref 0.50–1.35)
GFR calc Af Amer: 26 mL/min — ABNORMAL LOW (ref >90)
GFR calc non Af Amer: 23 mL/min — ABNORMAL LOW (ref >90)
GLUCOSE: 99 mg/dL (ref 70–99)
Potassium: 4.1 mEq/L (ref 3.7–5.3)
Sodium: 146 mEq/L (ref 137–147)

## 2013-03-03 LAB — MRSA PCR SCREENING: MRSA BY PCR: NEGATIVE

## 2013-03-03 MED ORDER — DEXTROSE 50 % IV SOLN
25.0000 mL | Freq: Once | INTRAVENOUS | Status: AC | PRN
  Administered 2013-03-03: 25 mL via INTRAVENOUS

## 2013-03-03 MED ORDER — DEXTROSE 50 % IV SOLN
INTRAVENOUS | Status: AC
  Administered 2013-03-03: 25 mL via INTRAVENOUS
  Filled 2013-03-03: qty 50

## 2013-03-03 MED ORDER — DEXTROSE-NACL 5-0.9 % IV SOLN
INTRAVENOUS | Status: DC
  Administered 2013-03-03: 1000 mL via INTRAVENOUS
  Administered 2013-03-03 – 2013-03-04 (×3): via INTRAVENOUS
  Administered 2013-03-05: 100 mL/h via INTRAVENOUS
  Filled 2013-03-03: qty 1000

## 2013-03-03 NOTE — Progress Notes (Signed)
Patient sats dropped down to 87% on 4L [place]. Lungs auscultated- clear bilaterally. Oxygen increased to 6L, humidity added. Coughing and deep breathing exercises, along with IS, done with patient. 750 achieved on IS. Pain medicine given at this time as well for sternal pain.  Will continue to monitor.
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
## 2013-03-03 NOTE — Progress Notes (Signed)
RN had assessed pt's need to void throughout the day. He stated it was normal for him to go extended length of time d/t CKD. Upon assessment, mbr's abdomen firm and distended. RN asked mbr to attempt voiding, he did without success. Pt states he does not feel urge to void. RN performed bladder scan, results >999 ml. Dr Georgette Dover notified, order received to place Foley Catheter and leave in place overnight. Placement uneventful w/ immediate urine return and emptied 1L urine within 20 minutes. VSS- RN will continue to monitor.

## 2013-03-03 NOTE — Progress Notes (Signed)
Pt hypoglycemic w/ CBG 68. Pt has been NPO and receiving NS @ 100 ml/hr. Per hypoglycemia protocol, RN administered 25 ml D50 IV. Pt also desaturating to 84%. O2 increased to 6L per [place]. Lungs auscultated, clear bilaterally. Pt c/o pain rated 4/10- RN administered pain medication- Dilaudid 1 mg IV. Dr Georgette Dover notified of change in condition, received orders to change IVF to D5NS @ current rate and to continue to monitor respiratory status. CBG recheck 93. RN will continue to monitor.

## 2013-03-04 LAB — GLUCOSE, CAPILLARY
Glucose-Capillary: 115 mg/dL — ABNORMAL HIGH (ref 70–99)
Glucose-Capillary: 116 mg/dL — ABNORMAL HIGH (ref 70–99)
Glucose-Capillary: 121 mg/dL — ABNORMAL HIGH (ref 70–99)
Glucose-Capillary: 128 mg/dL — ABNORMAL HIGH (ref 70–99)
Glucose-Capillary: 129 mg/dL — ABNORMAL HIGH (ref 70–99)
Glucose-Capillary: 137 mg/dL — ABNORMAL HIGH (ref 70–99)
Glucose-Capillary: 151 mg/dL — ABNORMAL HIGH (ref 70–99)
Glucose-Capillary: 163 mg/dL — ABNORMAL HIGH (ref 70–99)

## 2013-03-04 LAB — CBC
HCT: 21.1 % — ABNORMAL LOW (ref 39.0–52.0)
HCT: 20.4 % — ABNORMAL LOW (ref 39.0–52.0)
HEMOGLOBIN: 7.1 g/dL — AB (ref 13.0–17.0)
Hemoglobin: 7 g/dL — ABNORMAL LOW (ref 13.0–17.0)
MCH: 28.5 pg (ref 26.0–34.0)
MCH: 28.8 pg (ref 26.0–34.0)
MCHC: 33.6 g/dL (ref 30.0–36.0)
MCHC: 34.3 g/dL (ref 30.0–36.0)
MCV: 84.7 fL (ref 78.0–100.0)
MCV: 84 fL (ref 78.0–100.0)
PLATELETS: 178 10*3/uL (ref 150–400)
Platelets: 189 10*3/uL (ref 150–400)
RBC: 2.49 MIL/uL — ABNORMAL LOW (ref 4.22–5.81)
RBC: 2.43 MIL/uL — AB (ref 4.22–5.81)
RDW: 13.7 % (ref 11.5–15.5)
RDW: 13.7 % (ref 11.5–15.5)
WBC: 5.9 10*3/uL (ref 4.0–10.5)
WBC: 6.2 10*3/uL (ref 4.0–10.5)

## 2013-03-04 LAB — ABO/RH: ABO/RH(D): O POS

## 2013-03-04 LAB — TYPE AND SCREEN
ABO/RH(D): O POS
ANTIBODY SCREEN: NEGATIVE
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
## 2013-03-04 NOTE — Plan of Care (Signed)
Problem: Discharge Progression Outcomes Goal: Hemodynamically stable Continues to be hypertensive-receiving scheduled BP meds. No PRN meds ordered or given.
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
## 2013-03-05 ENCOUNTER — Inpatient Hospital Stay (HOSPITAL_COMMUNITY): Payer: 59

## 2013-03-05 LAB — GLUCOSE, CAPILLARY
Glucose-Capillary: 106 mg/dL — ABNORMAL HIGH (ref 70–99)
Glucose-Capillary: 135 mg/dL — ABNORMAL HIGH (ref 70–99)
Glucose-Capillary: 145 mg/dL — ABNORMAL HIGH (ref 70–99)
Glucose-Capillary: 142 mg/dL — ABNORMAL HIGH (ref 70–99)
Glucose-Capillary: 151 mg/dL — ABNORMAL HIGH (ref 70–99)

## 2013-03-05 LAB — BASIC METABOLIC PANEL
BUN: 36 mg/dL — ABNORMAL HIGH (ref 6–23)
CO2: 23 mEq/L (ref 19–32)
Calcium: 8.4 mg/dL (ref 8.4–10.5)
Chloride: 109 mEq/L (ref 96–112)
Creatinine, Ser: 2.7 mg/dL — ABNORMAL HIGH (ref 0.50–1.35)
GFR calc Af Amer: 31 mL/min — ABNORMAL LOW (ref >90)
GFR, EST NON AFRICAN AMERICAN: 26 mL/min — AB (ref >90)
Glucose, Bld: 103 mg/dL — ABNORMAL HIGH (ref 70–99)
POTASSIUM: 3.7 meq/L (ref 3.7–5.3)
SODIUM: 143 meq/L (ref 137–147)

## 2013-03-05 LAB — CBC
HCT: 22.8 % — ABNORMAL LOW (ref 39.0–52.0)
HEMATOCRIT: 22.1 % — AB (ref 39.0–52.0)
HEMOGLOBIN: 7.7 g/dL — AB (ref 13.0–17.0)
Hemoglobin: 7.5 g/dL — ABNORMAL LOW (ref 13.0–17.0)
MCH: 28.6 pg (ref 26.0–34.0)
MCH: 28.6 pg (ref 26.0–34.0)
MCHC: 33.8 g/dL (ref 30.0–36.0)
MCHC: 33.9 g/dL (ref 30.0–36.0)
MCV: 84.8 fL (ref 78.0–100.0)
MCV: 84.4 fL (ref 78.0–100.0)
PLATELETS: 209 10*3/uL (ref 150–400)
Platelets: 210 10*3/uL (ref 150–400)
RBC: 2.69 MIL/uL — ABNORMAL LOW (ref 4.22–5.81)
RBC: 2.62 MIL/uL — ABNORMAL LOW (ref 4.22–5.81)
RDW: 13.8 % (ref 11.5–15.5)
RDW: 13.8 % (ref 11.5–15.5)
WBC: 8.8 10*3/uL (ref 4.0–10.5)
WBC: 9.5 10*3/uL (ref 4.0–10.5)

## 2013-03-05 MED ORDER — HYDRALAZINE HCL 20 MG/ML IJ SOLN
10.0000 mg | INTRAMUSCULAR | Status: DC | PRN
  Administered 2013-03-06: 10 mg via INTRAVENOUS
  Filled 2013-03-05: qty 1

## 2013-03-05 MED ORDER — INSULIN ASPART 100 UNIT/ML ~~LOC~~ SOLN
0.0000 [IU] | Freq: Three times a day (TID) | SUBCUTANEOUS | Status: DC
  Administered 2013-03-06: 1 [IU] via SUBCUTANEOUS
  Administered 2013-03-06 – 2013-03-07 (×2): 2 [IU] via SUBCUTANEOUS
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
## 2013-03-05 NOTE — Progress Notes (Signed)
Pt arrived from 22M, VSS, no c/o pain or SOB, will continue to monitor.

## 2013-03-05 NOTE — Progress Notes (Signed)
Patient ID: Paul Gonzalez, male   DOB: [birth year], 48 y.o.   MRN: [id number] Sats dropped to low 80s on RA. Will ask IR to tap B pleural effusions. Georganna Skeans, MD, MPH, FACS Trauma: [phone] General Surgery: [phone]
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
## 2013-03-05 NOTE — Progress Notes (Signed)
UR completed.  Loana Salvaggio, RN BSN MHA CCM Trauma/Neuro ICU Case Manager [phone]  

## 2013-03-05 NOTE — Procedures (Signed)
Successful US guided right thoracentesis. Yielded 1.8L of blood tinged fluid. Pt tolerated procedure well. No immediate complications.  Specimen was not sent for labs. CXR ordered.  Ascencion Dike PA-C 03/05/2013 10:25 AM

## 2013-03-05 NOTE — Clinical Documentation Improvement (Signed)
  Possible Clinical Conditions?      Acute Respiratory Failure     Other Condition  Supporting Information:  Per the ED Physician, "O2 sats 80% on RA, placed on 4L [place] with improvement to 90%.  He has fluid in both hemithoraces on CXR.  The patient remains hypoxemic."   Per Dr. Grandville Silos on 03/05/2013, "Sats dropped to low 80s on RA. Will ask IR to tap B pleural effusions."   Treatment:  In the ED, the patient was placed on oxygen at 4L/[place] with increase to 6L/[place] as needed for low O2 sats.    Thank You,  Posey Pronto, RN, BSN, [place] Documentation Improvement Specialist HIM department--[place] Office [phone]

## 2013-03-06 ENCOUNTER — Inpatient Hospital Stay (HOSPITAL_COMMUNITY): Payer: 59

## 2013-03-06 LAB — CBC
HEMATOCRIT: 20.9 % — AB (ref 39.0–52.0)
HEMATOCRIT: 21.2 % — AB (ref 39.0–52.0)
Hemoglobin: 7.1 g/dL — ABNORMAL LOW (ref 13.0–17.0)
Hemoglobin: 7.3 g/dL — ABNORMAL LOW (ref 13.0–17.0)
MCH: 28.4 pg (ref 26.0–34.0)
MCH: 28.9 pg (ref 26.0–34.0)
MCHC: 34 g/dL (ref 30.0–36.0)
MCHC: 34.4 g/dL (ref 30.0–36.0)
MCV: 83.6 fL (ref 78.0–100.0)
MCV: 83.8 fL (ref 78.0–100.0)
Platelets: 190 10*3/uL (ref 150–400)
Platelets: 201 10*3/uL (ref 150–400)
RBC: 2.5 MIL/uL — ABNORMAL LOW (ref 4.22–5.81)
RBC: 2.53 MIL/uL — ABNORMAL LOW (ref 4.22–5.81)
RDW: 13.5 % (ref 11.5–15.5)
RDW: 13.6 % (ref 11.5–15.5)
WBC: 6.9 10*3/uL (ref 4.0–10.5)
WBC: 5.6 10*3/uL (ref 4.0–10.5)

## 2013-03-06 LAB — BASIC METABOLIC PANEL
BUN: 36 mg/dL — AB (ref 6–23)
CO2: 23 mEq/L (ref 19–32)
CREATININE: 2.89 mg/dL — AB (ref 0.50–1.35)
Calcium: 8.1 mg/dL — ABNORMAL LOW (ref 8.4–10.5)
Chloride: 105 mEq/L (ref 96–112)
GFR, EST AFRICAN AMERICAN: 28 mL/min — AB (ref >90)
GFR, EST NON AFRICAN AMERICAN: 24 mL/min — AB (ref >90)
Glucose, Bld: 123 mg/dL — ABNORMAL HIGH (ref 70–99)
Potassium: 3.7 mEq/L (ref 3.7–5.3)
Sodium: 141 mEq/L (ref 137–147)

## 2013-03-06 LAB — GLUCOSE, CAPILLARY
Glucose-Capillary: 142 mg/dL — ABNORMAL HIGH (ref 70–99)
Glucose-Capillary: 79 mg/dL (ref 70–99)
Glucose-Capillary: 159 mg/dL — ABNORMAL HIGH (ref 70–99)
Glucose-Capillary: 115 mg/dL — ABNORMAL HIGH (ref 70–99)

## 2013-03-06 MED ORDER — OXYCODONE HCL 5 MG PO TABS
5.0000 mg | ORAL_TABLET | ORAL | Status: DC | PRN
  Administered 2013-03-06 – 2013-03-07 (×3): 10 mg via ORAL
  Filled 2013-03-06 (×3): qty 2

## 2013-03-06 NOTE — Progress Notes (Signed)
Patient transferred to [facility] room 19. Oriented to room. Call bell in reach. Wife at bedside.
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
## 2013-03-06 NOTE — Procedures (Signed)
Successful US guided left thoracentesis. Yielded 1 liter of blood tinged fluid fluid. Pt tolerated procedure well. No immediate complications.  Specimen was not sent for labs. CXR ordered.  Tsosie Billing D PA-C 03/06/2013 11:28 AM

## 2013-03-06 NOTE — Progress Notes (Signed)
Patient returned from IR where he had a thorcentis done, tolerated well . Patient will transfer to [place] per MD order. Report called to Blanch Media, RN, will transfer in wheel chair. Alert and oriented, all VSS.
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
## 2013-03-07 ENCOUNTER — Other Ambulatory Visit (INDEPENDENT_AMBULATORY_CARE_PROVIDER_SITE_OTHER): Payer: Self-pay

## 2013-03-07 DIAGNOSIS — J90 Pleural effusion, not elsewhere classified: Secondary | ICD-10-CM

## 2013-03-07 DIAGNOSIS — S36039A Unspecified laceration of spleen, initial encounter: Secondary | ICD-10-CM

## 2013-03-07 DIAGNOSIS — D62 Acute posthemorrhagic anemia: Secondary | ICD-10-CM

## 2013-03-07 DIAGNOSIS — S2220XA Unspecified fracture of sternum, initial encounter for closed fracture: Secondary | ICD-10-CM

## 2013-03-07 DIAGNOSIS — S27329A Contusion of lung, unspecified, initial encounter: Secondary | ICD-10-CM

## 2013-03-07 LAB — BASIC METABOLIC PANEL
BUN: 39 mg/dL — AB (ref 6–23)
CO2: 25 mEq/L (ref 19–32)
Calcium: 8.3 mg/dL — ABNORMAL LOW (ref 8.4–10.5)
Chloride: 100 mEq/L (ref 96–112)
Creatinine, Ser: 2.97 mg/dL — ABNORMAL HIGH (ref 0.50–1.35)
GFR calc Af Amer: 27 mL/min — ABNORMAL LOW (ref >90)
GFR calc non Af Amer: 24 mL/min — ABNORMAL LOW (ref >90)
GLUCOSE: 170 mg/dL — AB (ref 70–99)
POTASSIUM: 4 meq/L (ref 3.7–5.3)
Sodium: 138 mEq/L (ref 137–147)

## 2013-03-07 LAB — CBC
HEMATOCRIT: 23.8 % — AB (ref 39.0–52.0)
Hemoglobin: 8.2 g/dL — ABNORMAL LOW (ref 13.0–17.0)
MCH: 28.7 pg (ref 26.0–34.0)
MCHC: 34.5 g/dL (ref 30.0–36.0)
MCV: 83.2 fL (ref 78.0–100.0)
Platelets: 242 10*3/uL (ref 150–400)
RBC: 2.86 MIL/uL — ABNORMAL LOW (ref 4.22–5.81)
RDW: 13.2 % (ref 11.5–15.5)
WBC: 6.8 10*3/uL (ref 4.0–10.5)

## 2013-03-07 LAB — GLUCOSE, CAPILLARY
Glucose-Capillary: 117 mg/dL — ABNORMAL HIGH (ref 70–99)
Glucose-Capillary: 167 mg/dL — ABNORMAL HIGH (ref 70–99)

## 2013-03-07 MED ORDER — IPRATROPIUM-ALBUTEROL 20-100 MCG/ACT IN AERS: 1.0000 | INHALATION_SPRAY | Freq: Four times a day (QID) | RESPIRATORY_TRACT | Status: DC

## 2013-03-07 MED ORDER — IPRATROPIUM-ALBUTEROL 0.5-2.5 (3) MG/3ML IN SOLN
3.0000 mL | RESPIRATORY_TRACT | Status: DC
  Administered 2013-03-07: 3 mL via RESPIRATORY_TRACT
  Filled 2013-03-07: qty 3

## 2013-03-07 MED ORDER — IPRATROPIUM-ALBUTEROL 0.5-2.5 (3) MG/3ML IN SOLN: 3.0000 mL | RESPIRATORY_TRACT | Status: DC | PRN

## 2013-03-07 MED ORDER — OXYCODONE HCL 5 MG PO TABS: 5.0000 mg | ORAL_TABLET | Freq: Four times a day (QID) | ORAL | Status: DC | PRN

## 2013-03-07 NOTE — Progress Notes (Signed)
Pt. Ambulated in hallway and O2 sats remained between 85-86%.  Pt. O2 sats at rest is between 93-94%.  PA ordered some breathing treatments and wants pulse ox to be checked while pt. Is ambulating.  Will continue to monitor. Syliva Overman
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
## 2013-03-07 NOTE — Progress Notes (Signed)
UR completed.  Adriahna Shearman, RN BSN M[facility] CCM Trauma/Neuro ICU Case Manager [phone]  

## 2013-03-07 NOTE — Discharge Instructions (Signed)
Pulmonary Contusion A pulmonary contusion is a deep bruise to the lung. The lungs bring oxygen into the bloodstream and remove carbon dioxide that the body cannot use. A pulmonary contusion causes the lung tissue to swell and bleed into the surrounding area. This interferes with the ability of the lungs to function. You may feel short of breath because you are not getting enough oxygen.  CAUSES  Pulmonary contusions usually happen when there is a chest injury, such as from:  Car crashes.  Severe falls, especially from a high height.  Being near an explosion.  Sports injuries.  Job injuries.  Crush injuries, such as from industrial or farming machinery. SYMPTOMS   Shortness of breath.  Fast breathing.  Fast heart rate.  Bruising of the chest.  Chest pain.  Coughing.  Spitting blood. When the pulmonary contusion is severe, the symptoms may get worse over the first 24 to 48 hours. More serious symptoms may include:  Blueness of the lips or nail beds.  Sweating.  Feeling faint or dizzy.  Feeling confused. DIAGNOSIS  A pulmonary contusion is suspected when there is any kind of severe blow to the chest, especially when it is followed by difficulty breathing. The caregiver usually observes your breathing and checks your chest for bruised or swollen areas. Other tests may include:  Imaging tests such as:  Chest X-rays. An X-ray might not show evidence of a pulmonary contusion for 1 to 2 days after the injury, so chest X-rays may be repeated several times.  A computed tomography (CT) scan.  Magnetic resonance imaging (MRI).  Pulse oximetry. For this test, a sensor is put on the finger to measure heart rate and the amount of oxygen in the bloodstream.  Arterial blood gases. This is a blood test that measures the amount of oxygen, carbon dioxide, and acid in the blood. TREATMENT  Treatment is often given in an emergency department. Most people with a pulmonary contusion  need to be carefully checked because there may be other injuries. Treatment may include:  Taking pain medicine. People with a pulmonary contusion usually have chest pain.  Fluids given through an intravenous line (IV).  Performing breathing exercises to help with deep breathing and to avoid pneumonia. You may be asked to use a device called an incentive spirometer. This can help with deep breathing exercises.  Oxygen if there is difficulty breathing and low blood oxygen. In severe cases, you may need to have a tube placed in your throat and a machine (ventilator) to help with breathing.  Surgery if a blood vessel continues to bleed uncontrollably or if the lung has been punctured. HOME CARE INSTRUCTIONS   Only take over-the-counter or prescription medicines for pain, discomfort, or fever as directed by your caregiver. Do not take aspirin for the first few days. This may increase bruising.  Continue to do deep breathing exercises. Use the incentive spirometer if your caregiver tells you to.  Return to normal activities only when your caregiver approves. This includes driving, work, school, and sports. SEEK IMMEDIATE MEDICAL CARE IF:   You have difficulty breathing and it is getting worse.  Your chest pain gets worse.  You cough up blood.  You start to wheeze.  Your lips or nail beds appear blue.  You have a fever.  You feel dizzy, confused, or like you will faint. MAKE SURE YOU:   Understand these instructions.  Will watch your condition.  Will get help right away if you are not doing well  or get worse. Document Released: 09/30/2007 Document Revised: 03/15/2011 Document Reviewed: 11/06/2010 [facility]  Patient Information 2014 [place], [place].  Sternal Fracture The sternum is the bone in the center of the front of your chest which your ribs attach to. It is also called the breastbone. The most common cause of a sternal fracture (break in the bone) is an injury. The most common  injury is from a motor vehicle accident. The fracture often comes from the seatbelt or hitting the chest on the steering wheel or being forcibly bent forward (shoulders towards your knees) during an accident. It is more common in females and the elderly. The fracture of the sternum is usually not a problem if there are no other injuries. Other injuries that may happen are to the ribs, heart, lungs, and abdominal organs. SYMPTOMS  Common complaints from a fracture of the sternum include:  Shortness of breath.  Pain with breathing or difficulty breathing.  Bruises about the chest.  Tenderness or a cracking sound at the breastbone. DIAGNOSIS  Your caregiver may be able to tell if the sternum is broken by examining you. Other times studies such as X-ray, CAT scan, ultrasound, and nuclear medicine are used to detect a fracture.  TREATMENT   Sternal fractures usually are not serious and if displacement is minimal, no treatment is necessary.  The main concern is with damage to the surrounding structures: ribs, heart, great vessels coming from the heart, and the back bone in the chest area.  Multiple rib fractures may cause breathing difficulties.  Injury to one of the large vessels in the chest may be a threat to life and require immediate surgery.  If injury to the heart or lungs is suspected it may be necessary to stay in the hospital and be monitored.  Other injuries will be treated as needed.  If the pieces of the breastbone are out of normal position, they may need to be reduced (put back in position) and then wired in place or fixed with a plate and screws during an operation. HOME CARE INSTRUCTIONS   Avoid strenuous activity. Be careful during activities and avoid bumping or re-injuring the injured sternum. Activities that cause pain pull on the fracture site(s) and are best avoided if possible.  Eat a normal, well-balanced diet. Drink plenty of fluids to avoid constipation, a common  side effect of pain medications.  Take deep breaths and cough several times a day, splinting the injured area with a pillow. This will help prevent pneumonia.  Do not wear a rib belt or binder for the chest unless instructed otherwise. These restrict breathing and can lead to pneumonia.  Only take over-the-counter or prescription medicines for pain, discomfort, or fever as directed by your caregiver. SEEK MEDICAL CARE IF:  You develop a continual cough, associated with thick or bloody mucus or phlegm (sputum). SEEK IMMEDIATE MEDICAL CARE IF:   You have a fever.  You have increasing difficulty breathing.  You feel sick to your stomach (nausea), vomit, or have abdominal pain.  You have worsening pain, not controlled with medications.  You develop pain in the tops of your shoulders (in the shoulder strap area).  You feel lightheaded or faint.  You develop chest pain or an abnormal heart beat (palpitations).  You develop pain radiating into the jaw, teeth or down the arms. Document Released: 08/05/2003 Document Revised: 03/15/2011 Document Reviewed: 03/25/2008 [facility] Patient Information 2014 [place], [place].  Splenic Injury A splenic injury is an injury to the spleen. The  spleen is an organ located in the upper left side of the abdomen, just under the ribs. The spleen filters and cleans the blood. It also stores blood cells and destroys cells that are worn out. The spleen is involved in fighting disease. However, when the spleen is removed, your body can still fight most diseases without it.  CAUSES  A blow to the abdomen can injure the spleen. In some cases, the spleen may only be bruised with some bleeding inside the covering and around the spleen. However, sometimes an injury to the spleen causes it to break (rupture). Illness can also cause the spleen to become enlarged and rupture. Because the spleen has a lot of blood going to it, a rupture is very serious and can be  life-threatening. SYMPTOMS  Often, a minor splenic injury causes no symptoms or only minor abdominal pain. When bleeding from the injury is severe, the patient's blood pressure may rapidly decrease. This may cause some of the following problems:  Dizziness or lightheadedness.  Rapid heart rate.  Abdominal tenderness and swelling.  Fainting.  Sweating with clammy skin. DIAGNOSIS  Your caregiver may immediately know what is wrong by taking a history and doing a physical exam. If there is time, the diagnosis is often confirmed by a CT scan. Other imaging tests may also be done, such as an ultrasound exam or sometimes an MRI scan. Lab tests may be done to check your blood and may be needed frequently for a couple days after the injury.  TREATMENT   If the blood pressure is too low, emergency surgery may be needed.  When injuries are less severe, your surgeon may decide to observe the injury, or to treat the injury with interventional radiology.Interventional radiology uses flexible tubes (catheters) to stop the bleeding from inside the blood vessel. It can only be used under certain circumstances. Your caregiver will tell you if this is an option.  One to several days in an intensive care unit (ICU) may be required. Fluid and blood levels will be monitored closely.Intravenous (IV) fluids and blood transfusions are sometimes needed. Follow-up scans may be used to check how the spleen is healing. The spleen may be able to heal itself. However, if the problems are getting worse, surgery may still be needed. HOME CARE INSTRUCTIONS  Keep all follow-up appointments as instructed by your caregiver. Even when the spleen appears to have healed well, your surgeon may want to continue following up on the injury.  Ask your caregiver if you need any immunizations. You may need certain immunizations depending on whether you had surgery, interventional radiology, or some other treatment for your spleen  injury. SEEK IMMEDIATE MEDICAL CARE IF:  You have a fever.  Your abdominal pain gets worse.  You develop signs of infection, such as chills and feeling unwell. MAKE SURE YOU:  Understand these instructions.  Will watch your condition.  Will get help right away if you are not doing well or get worse. Document Released: 10/12/2005 Document Revised: 03/15/2011 Document Reviewed: 06/26/2010 [facility] Patient Information 2014 [place], [place].

## 2013-03-07 NOTE — Progress Notes (Signed)
May be able to go home later today.  Much betted after large volume thoracentesis.  This patient has been seen and I agree with the findings and treatment plan.  Kathryne Eriksson. Dahlia Bailiff, MD, [place] [phone] (pager) [phone] (direct pager) Trauma Surgeon
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
## 2013-03-14 ENCOUNTER — Ambulatory Visit
Admission: RE | Admit: 2013-03-14 | Discharge: 2013-03-14 | Disposition: A | Discharge status: Home / Self Care | Payer: 59 | Source: Ambulatory Visit | Attending: General Surgery | Admitting: General Surgery

## 2013-03-14 ENCOUNTER — Ambulatory Visit (INDEPENDENT_AMBULATORY_CARE_PROVIDER_SITE_OTHER): Payer: 59 | Admitting: Orthopedic Surgery

## 2013-03-14 ENCOUNTER — Encounter (INDEPENDENT_AMBULATORY_CARE_PROVIDER_SITE_OTHER): Payer: Self-pay

## 2013-03-14 VITALS — BP 148/80 | HR 76 | Temp 99.0°F | Resp 14 | Ht 70.0 in | Wt 167.0 lb

## 2013-03-14 DIAGNOSIS — J90 Pleural effusion, not elsewhere classified: Secondary | ICD-10-CM

## 2013-03-14 DIAGNOSIS — S2220XA Unspecified fracture of sternum, initial encounter for closed fracture: Secondary | ICD-10-CM

## 2013-03-14 MED ORDER — OXYCODONE-ACETAMINOPHEN 10-325 MG PO TABS: 1.0000 | ORAL_TABLET | ORAL | Status: DC | PRN

## 2013-03-14 MED ORDER — NAPROXEN 500 MG PO TABS: 500.0000 mg | ORAL_TABLET | Freq: Two times a day (BID) | ORAL | Status: DC
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
## 2013-03-28 ENCOUNTER — Telehealth: Payer: Self-pay | Admitting: Family Medicine

## 2013-03-28 NOTE — Telephone Encounter (Signed)
Pt calling to see if he needs BW before his next appt?

## 2013-03-28 NOTE — Telephone Encounter (Signed)
Only A1C when he gets here

## 2013-03-28 NOTE — Telephone Encounter (Signed)
Discussed with patient

## 2013-03-28 NOTE — Telephone Encounter (Signed)
Last bloodwork 01/05/2013 lipid, liver, bmp, a1c, microalb urine.

## 2013-04-10 ENCOUNTER — Encounter: Payer: Self-pay | Admitting: Family Medicine

## 2013-04-10 ENCOUNTER — Ambulatory Visit (INDEPENDENT_AMBULATORY_CARE_PROVIDER_SITE_OTHER): Payer: PRIVATE HEALTH INSURANCE | Admitting: Family Medicine

## 2013-04-10 VITALS — BP 118/62 | Ht 68.5 in | Wt 155.6 lb

## 2013-04-10 DIAGNOSIS — E785 Hyperlipidemia, unspecified: Secondary | ICD-10-CM

## 2013-04-10 DIAGNOSIS — E119 Type 2 diabetes mellitus without complications: Secondary | ICD-10-CM

## 2013-04-10 DIAGNOSIS — E1149 Type 2 diabetes mellitus with other diabetic neurological complication: Secondary | ICD-10-CM

## 2013-04-10 DIAGNOSIS — I10 Essential (primary) hypertension: Secondary | ICD-10-CM

## 2013-04-10 DIAGNOSIS — N058 Unspecified nephritic syndrome with other morphologic changes: Secondary | ICD-10-CM

## 2013-04-10 DIAGNOSIS — E1121 Type 2 diabetes mellitus with diabetic nephropathy: Secondary | ICD-10-CM | POA: Insufficient documentation

## 2013-04-10 DIAGNOSIS — E1129 Type 2 diabetes mellitus with other diabetic kidney complication: Secondary | ICD-10-CM

## 2013-04-10 DIAGNOSIS — E1140 Type 2 diabetes mellitus with diabetic neuropathy, unspecified: Secondary | ICD-10-CM | POA: Insufficient documentation

## 2013-04-10 LAB — POCT GLYCOSYLATED HEMOGLOBIN (HGB A1C): Hemoglobin A1C: 6.1

## 2013-04-10 MED ORDER — HYDRALAZINE HCL 50 MG PO TABS: 50.0000 mg | ORAL_TABLET | Freq: Two times a day (BID) | ORAL | Status: DC

## 2013-04-10 MED ORDER — IPRATROPIUM-ALBUTEROL 20-100 MCG/ACT IN AERS: 1.0000 | INHALATION_SPRAY | Freq: Four times a day (QID) | RESPIRATORY_TRACT | Status: DC

## 2013-04-10 MED ORDER — INSULIN NPH (HUMAN) (ISOPHANE) 100 UNIT/ML ~~LOC~~ SUSP: 10.0000 [IU] | Freq: Every day | SUBCUTANEOUS | Status: DC

## 2013-04-10 MED ORDER — NAPROXEN 500 MG PO TABS: 500.0000 mg | ORAL_TABLET | Freq: Two times a day (BID) | ORAL | Status: DC

## 2013-04-10 MED ORDER — CITALOPRAM HYDROBROMIDE 20 MG PO TABS: 20.0000 mg | ORAL_TABLET | Freq: Every day | ORAL | Status: DC

## 2013-04-10 MED ORDER — AMLODIPINE BESYLATE 10 MG PO TABS: 10.0000 mg | ORAL_TABLET | Freq: Every day | ORAL | Status: DC

## 2013-04-10 MED ORDER — FUROSEMIDE 40 MG PO TABS: 40.0000 mg | ORAL_TABLET | Freq: Two times a day (BID) | ORAL | Status: DC
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
## 2013-05-11 DIAGNOSIS — Z0289 Encounter for other administrative examinations: Secondary | ICD-10-CM

## 2013-05-15 ENCOUNTER — Other Ambulatory Visit (HOSPITAL_COMMUNITY): Payer: Self-pay | Admitting: Nephrology

## 2013-05-15 DIAGNOSIS — N289 Disorder of kidney and ureter, unspecified: Secondary | ICD-10-CM

## 2013-06-15 ENCOUNTER — Ambulatory Visit (HOSPITAL_COMMUNITY)
Admission: RE | Admit: 2013-06-15 | Discharge: 2013-06-15 | Disposition: A | Discharge status: Home / Self Care | Payer: 59 | Source: Ambulatory Visit | Attending: Nephrology | Admitting: Nephrology

## 2013-06-15 DIAGNOSIS — I10 Essential (primary) hypertension: Secondary | ICD-10-CM | POA: Insufficient documentation

## 2013-06-15 DIAGNOSIS — N289 Disorder of kidney and ureter, unspecified: Secondary | ICD-10-CM | POA: Insufficient documentation

## 2013-06-15 DIAGNOSIS — E119 Type 2 diabetes mellitus without complications: Secondary | ICD-10-CM | POA: Insufficient documentation

## 2013-07-04 ENCOUNTER — Telehealth: Payer: Self-pay | Admitting: Family Medicine

## 2013-07-04 NOTE — Telephone Encounter (Signed)
Not surprised, profound noncompliance

## 2013-07-04 NOTE — Telephone Encounter (Signed)
FYI - pt was referred to Dr. Dorris Fetch (referral was faxed 02/22/13) Per Dr. Liliane Channel office, pt never returned their call to schedule his appointment, will cancel for now and process later if pt desires

## 2013-07-10 ENCOUNTER — Other Ambulatory Visit: Payer: Self-pay | Admitting: Family Medicine

## 2013-07-27 ENCOUNTER — Ambulatory Visit (INDEPENDENT_AMBULATORY_CARE_PROVIDER_SITE_OTHER): Payer: PRIVATE HEALTH INSURANCE | Admitting: Family Medicine

## 2013-07-27 ENCOUNTER — Encounter: Payer: Self-pay | Admitting: Family Medicine

## 2013-07-27 VITALS — BP 130/88 | Resp 20 | Ht 68.5 in | Wt 173.0 lb

## 2013-07-27 DIAGNOSIS — E1142 Type 2 diabetes mellitus with diabetic polyneuropathy: Secondary | ICD-10-CM

## 2013-07-27 DIAGNOSIS — I10 Essential (primary) hypertension: Secondary | ICD-10-CM

## 2013-07-27 DIAGNOSIS — E1140 Type 2 diabetes mellitus with diabetic neuropathy, unspecified: Secondary | ICD-10-CM

## 2013-07-27 DIAGNOSIS — E1149 Type 2 diabetes mellitus with other diabetic neurological complication: Secondary | ICD-10-CM

## 2013-07-27 DIAGNOSIS — E785 Hyperlipidemia, unspecified: Secondary | ICD-10-CM

## 2013-07-27 LAB — POCT GLYCOSYLATED HEMOGLOBIN (HGB A1C): HEMOGLOBIN A1C: 5.2

## 2013-07-27 MED ORDER — CITALOPRAM HYDROBROMIDE 20 MG PO TABS: ORAL_TABLET | ORAL | Status: DC

## 2013-07-27 MED ORDER — LORATADINE 10 MG PO TABS: 10.0000 mg | ORAL_TABLET | Freq: Every day | ORAL | Status: DC
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
## 2013-10-16 ENCOUNTER — Encounter (HOSPITAL_COMMUNITY): Payer: Self-pay | Admitting: Lab

## 2013-10-25 ENCOUNTER — Ambulatory Visit (HOSPITAL_COMMUNITY): Payer: 59

## 2013-10-26 NOTE — Progress Notes (Signed)
This encounter was created in error - please disregard.

## 2013-11-06 ENCOUNTER — Ambulatory Visit (HOSPITAL_COMMUNITY): Payer: 59

## 2013-11-07 NOTE — Progress Notes (Signed)
This encounter was created in error - please disregard.

## 2013-11-20 ENCOUNTER — Ambulatory Visit (HOSPITAL_COMMUNITY): Payer: 59

## 2013-11-21 NOTE — Progress Notes (Signed)
This encounter was created in error - please disregard.

## 2013-12-07 ENCOUNTER — Encounter (HOSPITAL_COMMUNITY): Payer: Self-pay | Admitting: *Deleted

## 2013-12-07 NOTE — Progress Notes (Signed)
Telephone call to patient and meds and health history reviewed with wife.

## 2013-12-10 ENCOUNTER — Encounter (HOSPITAL_COMMUNITY): Payer: Self-pay

## 2013-12-10 ENCOUNTER — Encounter (HOSPITAL_COMMUNITY): Payer: Managed Care, Other (non HMO) | Attending: Hematology and Oncology

## 2013-12-10 VITALS — BP 160/74 | HR 74 | Temp 97.9°F | Resp 20 | Ht 70.0 in | Wt 163.3 lb

## 2013-12-10 DIAGNOSIS — D631 Anemia in chronic kidney disease: Secondary | ICD-10-CM | POA: Diagnosis present

## 2013-12-10 DIAGNOSIS — I10 Essential (primary) hypertension: Secondary | ICD-10-CM

## 2013-12-10 DIAGNOSIS — Z794 Long term (current) use of insulin: Secondary | ICD-10-CM | POA: Insufficient documentation

## 2013-12-10 DIAGNOSIS — E119 Type 2 diabetes mellitus without complications: Secondary | ICD-10-CM | POA: Insufficient documentation

## 2013-12-10 DIAGNOSIS — E785 Hyperlipidemia, unspecified: Secondary | ICD-10-CM | POA: Insufficient documentation

## 2013-12-10 DIAGNOSIS — N184 Chronic kidney disease, stage 4 (severe): Secondary | ICD-10-CM | POA: Diagnosis not present

## 2013-12-10 DIAGNOSIS — D649 Anemia, unspecified: Secondary | ICD-10-CM

## 2013-12-10 DIAGNOSIS — I129 Hypertensive chronic kidney disease with stage 1 through stage 4 chronic kidney disease, or unspecified chronic kidney disease: Secondary | ICD-10-CM | POA: Diagnosis not present

## 2013-12-10 LAB — COMPREHENSIVE METABOLIC PANEL
ALT: 28 U/L (ref 0–53)
ANION GAP: 17 — AB (ref 5–15)
AST: 23 U/L (ref 0–37)
Albumin: 4 g/dL (ref 3.5–5.2)
Alkaline Phosphatase: 89 U/L (ref 39–117)
BILIRUBIN TOTAL: 0.2 mg/dL — AB (ref 0.3–1.2)
BUN: 95 mg/dL — ABNORMAL HIGH (ref 6–23)
CHLORIDE: 101 meq/L (ref 96–112)
CO2: 24 meq/L (ref 19–32)
CREATININE: 4.31 mg/dL — AB (ref 0.50–1.35)
Calcium: 9.3 mg/dL (ref 8.4–10.5)
GFR, EST AFRICAN AMERICAN: 17 mL/min — AB (ref >90)
GFR, EST NON AFRICAN AMERICAN: 15 mL/min — AB (ref >90)
GLUCOSE: 128 mg/dL — AB (ref 70–99)
Potassium: 4.1 mEq/L (ref 3.7–5.3)
Sodium: 142 mEq/L (ref 137–147)
Total Protein: 7.3 g/dL (ref 6.0–8.3)

## 2013-12-10 LAB — CBC WITH DIFFERENTIAL/PLATELET
BASOS PCT: 1 % (ref 0–1)
Basophils Absolute: 0 10*3/uL (ref 0.0–0.1)
Eosinophils Absolute: 0.3 10*3/uL (ref 0.0–0.7)
Eosinophils Relative: 7 % — ABNORMAL HIGH (ref 0–5)
HEMATOCRIT: 19.7 % — AB (ref 39.0–52.0)
HEMOGLOBIN: 6.8 g/dL — AB (ref 13.0–17.0)
Lymphocytes Relative: 19 % (ref 12–46)
Lymphs Abs: 0.9 10*3/uL (ref 0.7–4.0)
MCH: 30.1 pg (ref 26.0–34.0)
MCHC: 34.5 g/dL (ref 30.0–36.0)
MCV: 87.2 fL (ref 78.0–100.0)
Monocytes Absolute: 0.3 10*3/uL (ref 0.1–1.0)
Monocytes Relative: 6 % (ref 3–12)
NEUTROS ABS: 3.3 10*3/uL (ref 1.7–7.7)
Neutrophils Relative %: 67 % (ref 43–77)
Platelets: 202 10*3/uL (ref 150–400)
RBC: 2.26 MIL/uL — ABNORMAL LOW (ref 4.22–5.81)
RDW: 13 % (ref 11.5–15.5)
WBC: 4.8 10*3/uL (ref 4.0–10.5)

## 2013-12-10 LAB — RETICULOCYTES
RBC.: 2.26 MIL/uL — ABNORMAL LOW (ref 4.22–5.81)
Retic Count, Absolute: 15.8 10*3/uL — ABNORMAL LOW (ref 19.0–186.0)
Retic Ct Pct: 0.7 % (ref 0.4–3.1)

## 2013-12-10 LAB — LACTATE DEHYDROGENASE: LDH: 267 U/L — AB (ref 94–250)

## 2013-12-10 NOTE — Progress Notes (Signed)
CRITICAL VALUE ALERT Critical value received:  Hgb 6.9 Date of notification:  12/10/2013  Time of notification: [id number] Critical value read back:  Yes.   Nurse who received alert:  Jereld Presti, Hermenia Fiscal, RN MD notified (1st page):  T. Sheldon Silvan, PA-C
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
## 2013-12-10 NOTE — Progress Notes (Signed)
Paul Gonzalez presented for labwork. Labs per MD order drawn via Peripheral Line 23 gauge needle inserted in left antecubital.  Good blood return present. Procedure without incident.  Needle removed intact. Patient tolerated procedure well.

## 2013-12-10 NOTE — Patient Instructions (Signed)
[facility] Discharge Instructions  RECOMMENDATIONS MADE BY THE CONSULTANT AND ANY TEST RESULTS WILL BE SENT TO YOUR REFERRING PHYSICIAN.  EXAM FINDINGS BY THE PHYSICIAN TODAY AND SIGNS OR SYMPTOMS TO REPORT TO CLINIC OR PRIMARY PHYSICIAN: Exam and findings as discussed by Dr.Formanek.  MEDICATIONS PRESCRIBED:  Continue as prescribed.  INSTRUCTIONS/FOLLOW-UP: Lab work today. We will call you if there are any unexpected results. Return to clinic in 3 weeks for MD appointment. Report any issues/concerns to clinic as needed prior to appointment.  Thank you for choosing [facility] to provide your oncology and hematology care.  To afford each patient quality time with our providers, please arrive at least 15 minutes before your scheduled appointment time.  With your help, our goal is to use those 15 minutes to complete the necessary work-up to ensure our physicians have the information they need to help with your evaluation and healthcare recommendations.    Effective January 1st, 2014, we ask that you re-schedule your appointment with our physicians should you arrive 10 or more minutes late for your appointment.  We strive to give you quality time with our providers, and arriving late affects you and other patients whose appointments are after yours.    Again, thank you for choosing [facility].  Our hope is that these requests will decrease the amount of time that you wait before being seen by our physicians.       _____________________________________________________________  Should you have questions after your visit to [facility], please contact our office at [phone] between the hours of 8:30 a.m. and 4:30 p.m.  Voicemails left after 4:30 p.m. will not be returned until the following business day.  For prescription refill requests, have your pharmacy contact our office with your prescription refill request.     _______________________________________________________________  We hope that we have given you very good care.  You may receive a patient satisfaction survey in the mail, please complete it and return it as soon as possible.  We value your feedback!  _______________________________________________________________  Have you asked about our STAR program?  STAR stands for Survivorship Training and Rehabilitation, and this is a nationally recognized cancer care program that focuses on survivorship and rehabilitation.  Cancer and cancer treatments may cause problems, such as, pain, making you feel tired and keeping you from doing the things that you need or want to do. Cancer rehabilitation can help. Our goal is to reduce these troubling effects and help you have the best quality of life possible.  You may receive a survey from a nurse that asks questions about your current state of health.  Based on the survey results, all eligible patients will be referred to the [facility] program for an evaluation so we can better serve you!  A frequently asked questions sheet is available upon request.

## 2013-12-11 ENCOUNTER — Telehealth (HOSPITAL_COMMUNITY): Payer: Self-pay | Admitting: Hematology and Oncology

## 2013-12-11 ENCOUNTER — Other Ambulatory Visit: Payer: Self-pay | Admitting: Family Medicine

## 2013-12-11 DIAGNOSIS — N184 Chronic kidney disease, stage 4 (severe): Secondary | ICD-10-CM

## 2013-12-11 DIAGNOSIS — D631 Anemia in chronic kidney disease: Secondary | ICD-10-CM | POA: Insufficient documentation

## 2013-12-11 LAB — FERRITIN: FERRITIN: 75 ng/mL (ref 22–322)

## 2013-12-11 LAB — IRON AND TIBC
Iron: 61 ug/dL (ref 42–135)
Saturation Ratios: 25 % (ref 20–55)
TIBC: 242 ug/dL (ref 215–435)
UIBC: 181 ug/dL (ref 125–400)

## 2013-12-11 LAB — DIRECT ANTIGLOBULIN TEST (NOT AT ARMC)
DAT, IgG: NEGATIVE
DAT, complement: NEGATIVE

## 2013-12-11 LAB — FOLATE: Folate: >20 ng/mL

## 2013-12-11 LAB — VITAMIN B12: Vitamin B-12: 586 pg/mL (ref 211–911)

## 2013-12-11 NOTE — Telephone Encounter (Signed)
[employer] [phone] PER YELLE [id number] REQUIRES AN AUTH. PAYS @ 100% OF ALLOWABLE. NO DED/COIN/COPAY  MUST CONTACT PHARMACY DEPT FOR AUTH  [phone]  [employer] AUTH [phone] [place] CSR WILL FAX FORM TO MY ATTENTION

## 2013-12-12 ENCOUNTER — Other Ambulatory Visit (HOSPITAL_COMMUNITY): Payer: Self-pay | Admitting: Hematology and Oncology

## 2013-12-12 LAB — ERYTHROPOIETIN: Erythropoietin: 5.7 m[IU]/mL (ref 2.6–18.5)

## 2013-12-12 LAB — HEMOGLOBINOPATHY EVALUATION
HGB A2 QUANT: 2.1 % — AB (ref 2.2–3.2)
HGB A: 97.9 % — AB (ref 96.8–97.8)
HGB F QUANT: 0 % (ref 0.0–2.0)
HGB S QUANTITAION: 0 %
Hemoglobin Other: 0 %

## 2013-12-22 HISTORY — PX: EYE SURGERY: SHX253
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
## 2014-01-01 ENCOUNTER — Encounter (HOSPITAL_BASED_OUTPATIENT_CLINIC_OR_DEPARTMENT_OTHER): Payer: Managed Care, Other (non HMO) | Admitting: Hematology & Oncology

## 2014-01-01 ENCOUNTER — Other Ambulatory Visit (HOSPITAL_COMMUNITY): Payer: 59

## 2014-01-01 ENCOUNTER — Ambulatory Visit (HOSPITAL_COMMUNITY): Payer: 59 | Admitting: Hematology & Oncology

## 2014-01-01 ENCOUNTER — Encounter (HOSPITAL_COMMUNITY): Payer: Self-pay | Admitting: Hematology & Oncology

## 2014-01-01 ENCOUNTER — Encounter (HOSPITAL_BASED_OUTPATIENT_CLINIC_OR_DEPARTMENT_OTHER): Payer: Managed Care, Other (non HMO)

## 2014-01-01 VITALS — BP 116/62 | HR 71 | Temp 97.8°F | Resp 16 | Wt 150.8 lb

## 2014-01-01 DIAGNOSIS — D649 Anemia, unspecified: Secondary | ICD-10-CM

## 2014-01-01 DIAGNOSIS — D631 Anemia in chronic kidney disease: Secondary | ICD-10-CM

## 2014-01-01 DIAGNOSIS — E119 Type 2 diabetes mellitus without complications: Secondary | ICD-10-CM

## 2014-01-01 DIAGNOSIS — N184 Chronic kidney disease, stage 4 (severe): Secondary | ICD-10-CM

## 2014-01-01 LAB — CBC WITH DIFFERENTIAL/PLATELET
Basophils Absolute: 0.1 10*3/uL (ref 0.0–0.1)
Basophils Relative: 1 % (ref 0–1)
EOS PCT: 9 % — AB (ref 0–5)
Eosinophils Absolute: 0.6 10*3/uL (ref 0.0–0.7)
HCT: 25 % — ABNORMAL LOW (ref 39.0–52.0)
Hemoglobin: 8.5 g/dL — ABNORMAL LOW (ref 13.0–17.0)
LYMPHS ABS: 0.9 10*3/uL (ref 0.7–4.0)
LYMPHS PCT: 16 % (ref 12–46)
MCH: 29.9 pg (ref 26.0–34.0)
MCHC: 34 g/dL (ref 30.0–36.0)
MCV: 88 fL (ref 78.0–100.0)
Monocytes Absolute: 0.4 10*3/uL (ref 0.1–1.0)
Monocytes Relative: 7 % (ref 3–12)
NEUTROS PCT: 67 % (ref 43–77)
Neutro Abs: 4 10*3/uL (ref 1.7–7.7)
Platelets: 239 10*3/uL (ref 150–400)
RBC: 2.84 MIL/uL — ABNORMAL LOW (ref 4.22–5.81)
RDW: 12.8 % (ref 11.5–15.5)
WBC: 5.9 10*3/uL (ref 4.0–10.5)

## 2014-01-01 LAB — COMPREHENSIVE METABOLIC PANEL
ALK PHOS: 91 U/L (ref 39–117)
ALT: 26 U/L (ref 0–53)
AST: 17 U/L (ref 0–37)
Albumin: 4.1 g/dL (ref 3.5–5.2)
Anion gap: 10 (ref 5–15)
BILIRUBIN TOTAL: 0.3 mg/dL (ref 0.3–1.2)
BUN: 100 mg/dL — ABNORMAL HIGH (ref 6–23)
CO2: 24 mmol/L (ref 19–32)
CREATININE: 4.83 mg/dL — AB (ref 0.50–1.35)
Calcium: 9.3 mg/dL (ref 8.4–10.5)
Chloride: 107 mEq/L (ref 96–112)
GFR calc Af Amer: 15 mL/min — ABNORMAL LOW (ref >90)
GFR, EST NON AFRICAN AMERICAN: 13 mL/min — AB (ref >90)
Glucose, Bld: 91 mg/dL (ref 70–99)
POTASSIUM: 4.4 mmol/L (ref 3.5–5.1)
Sodium: 141 mmol/L (ref 135–145)
Total Protein: 7.2 g/dL (ref 6.0–8.3)

## 2014-01-01 NOTE — Progress Notes (Signed)
Paul Gonzalez presented for labwork. Labs per MD order drawn via Peripheral Line 23 gauge needle inserted in right AC  Good blood return present. Procedure without incident.  Needle removed intact. Patient tolerated procedure well.

## 2014-01-02 ENCOUNTER — Telehealth (HOSPITAL_COMMUNITY): Payer: Self-pay | Admitting: Hematology & Oncology

## 2014-01-02 LAB — IGG, IGA, IGM
IGA: 185 mg/dL (ref 68–379)
IgG (Immunoglobin G), Serum: 1080 mg/dL (ref 650–1600)
IgM, Serum: 25 mg/dL — ABNORMAL LOW (ref 41–251)

## 2014-01-02 NOTE — Telephone Encounter (Signed)
Called [employer] to correct dosage of [name] from 529mcg to 30 mcg. I  Also ?'ed if cpt [id number] required auth. It does not. Auth# is still [id number] 12/23-06/11/14 for [facility] [phone]

## 2014-01-03 ENCOUNTER — Encounter (HOSPITAL_COMMUNITY): Payer: Self-pay

## 2014-01-03 ENCOUNTER — Encounter (HOSPITAL_BASED_OUTPATIENT_CLINIC_OR_DEPARTMENT_OTHER): Payer: Managed Care, Other (non HMO)

## 2014-01-03 DIAGNOSIS — N184 Chronic kidney disease, stage 4 (severe): Secondary | ICD-10-CM

## 2014-01-03 DIAGNOSIS — D631 Anemia in chronic kidney disease: Secondary | ICD-10-CM

## 2014-01-03 LAB — PROTEIN ELECTROPHORESIS, SERUM
ALPHA-2-GLOBULIN: 10.6 % (ref 7.1–11.8)
Albumin ELP: 59 % (ref 55.8–66.1)
Alpha-1-Globulin: 4.6 % (ref 2.9–4.9)
Beta 2: 4.6 % (ref 3.2–6.5)
Beta Globulin: 5 % (ref 4.7–7.2)
GAMMA GLOBULIN: 16.2 % (ref 11.1–18.8)
M-SPIKE, %: NOT DETECTED g/dL
Total Protein ELP: 6.9 g/dL (ref 6.0–8.3)

## 2014-01-03 LAB — IMMUNOFIXATION ELECTROPHORESIS
IGM, SERUM: 25 mg/dL — AB (ref 41–251)
IgA: 189 mg/dL (ref 68–379)
IgG (Immunoglobin G), Serum: 1060 mg/dL (ref 650–1600)
TOTAL PROTEIN ELP: 6.8 g/dL (ref 6.0–8.3)

## 2014-01-03 LAB — HM DIABETES EYE EXAM

## 2014-01-03 MED ORDER — DARBEPOETIN ALFA 40 MCG/0.4ML IJ SOSY
30.0000 ug | PREFILLED_SYRINGE | Freq: Once | INTRAMUSCULAR | Status: AC
  Administered 2014-01-03: 30 ug via SUBCUTANEOUS

## 2014-01-03 MED ORDER — DARBEPOETIN ALFA 40 MCG/0.4ML IJ SOSY
PREFILLED_SYRINGE | INTRAMUSCULAR | Status: AC
  Filled 2014-01-03: qty 0.4

## 2014-01-03 MED ORDER — DARBEPOETIN ALFA 100 MCG/0.5ML IJ SOSY: 30.0000 ug | PREFILLED_SYRINGE | Freq: Once | INTRAMUSCULAR | Status: DC

## 2014-01-03 NOTE — Progress Notes (Signed)
Clare Charon presents today for injection per MD orders. [name] 53mcg administered SQ in left Abdomen. Administration without incident. Patient tolerated well.

## 2014-01-07 ENCOUNTER — Encounter (HOSPITAL_COMMUNITY): Payer: Managed Care, Other (non HMO) | Attending: Hematology & Oncology

## 2014-01-07 ENCOUNTER — Encounter (HOSPITAL_COMMUNITY): Payer: Self-pay

## 2014-01-07 DIAGNOSIS — D631 Anemia in chronic kidney disease: Secondary | ICD-10-CM | POA: Insufficient documentation

## 2014-01-07 DIAGNOSIS — I129 Hypertensive chronic kidney disease with stage 1 through stage 4 chronic kidney disease, or unspecified chronic kidney disease: Secondary | ICD-10-CM | POA: Insufficient documentation

## 2014-01-07 DIAGNOSIS — E785 Hyperlipidemia, unspecified: Secondary | ICD-10-CM | POA: Insufficient documentation

## 2014-01-07 DIAGNOSIS — Z794 Long term (current) use of insulin: Secondary | ICD-10-CM | POA: Insufficient documentation

## 2014-01-07 DIAGNOSIS — N184 Chronic kidney disease, stage 4 (severe): Secondary | ICD-10-CM | POA: Insufficient documentation

## 2014-01-07 DIAGNOSIS — D649 Anemia, unspecified: Secondary | ICD-10-CM

## 2014-01-07 DIAGNOSIS — E119 Type 2 diabetes mellitus without complications: Secondary | ICD-10-CM | POA: Insufficient documentation

## 2014-01-07 MED ORDER — SODIUM CHLORIDE 0.9 % IV SOLN
125.0000 mg | Freq: Once | INTRAVENOUS | Status: AC
  Administered 2014-01-07: 125 mg via INTRAVENOUS
  Filled 2014-01-07: qty 10

## 2014-01-07 MED ORDER — SODIUM CHLORIDE 0.9 % IV SOLN
INTRAVENOUS | Status: DC
  Administered 2014-01-07: 14:00:00 via INTRAVENOUS

## 2014-01-07 NOTE — Patient Instructions (Signed)
[facility] Discharge Instructions  RECOMMENDATIONS MADE BY THE CONSULTANT AND ANY TEST RESULTS WILL BE SENT TO YOUR REFERRING PHYSICIAN.  You received an iron transfusion today.  Please follow up as scheduled.  Please call the clinic if you have any questions or concerns  Thank you for choosing [facility] to provide your oncology and hematology care.  To afford each patient quality time with our providers, please arrive at least 15 minutes before your scheduled appointment time.  With your help, our goal is to use those 15 minutes to complete the necessary work-up to ensure our physicians have the information they need to help with your evaluation and healthcare recommendations.    Effective January 1st, 2014, we ask that you re-schedule your appointment with our physicians should you arrive 10 or more minutes late for your appointment.  We strive to give you quality time with our providers, and arriving late affects you and other patients whose appointments are after yours.    Again, thank you for choosing [facility].  Our hope is that these requests will decrease the amount of time that you wait before being seen by our physicians.       _____________________________________________________________  Should you have questions after your visit to [facility], please contact our office at [phone] between the hours of 8:30 a.m. and 5:00 p.m.  Voicemails left after 4:30 p.m. will not be returned until the following business day.  For prescription refill requests, have your pharmacy contact our office with your prescription refill request.

## 2014-01-07 NOTE — Progress Notes (Signed)
Paul Gonzalez Tolerated iron transfusion today

## 2014-01-13 NOTE — Assessment & Plan Note (Addendum)
Pleasant 49 year old male with stage IV chronic kidney disease. He has underlying severe anemia and additional peripheral evaluation appears to be consistent with anemia secondary to chronic kidney disease. He has not had a bone marrow biopsy to date. Currently I do not feel he needs a bone marrow. Based upon available laboratory studies he will need a dose of IV iron to ensure his ferritin levels maintained above 100. We will dose his [name] at renal dosing starting at 0.45 mcg/kg monthly. We will follow his counts moving forward and should he not have appropriate response to ESA therapy we will consider bone marrow biopsy at that time.  He has not had a GI evaluation secondary to being below the age for recommended screening currently I think that is lower on our list of evaluating his anemia. I do feel the most likely cause is his kidney disease. We will see him back again in the next month or 2 we will keep a close watch on his counts moving forward and iron levels.

## 2014-01-13 NOTE — Addendum Note (Signed)
Addended by: Patrici Ranks on: 01/13/2014 08:41 AM   Modules accepted: Level of Service

## 2014-01-14 ENCOUNTER — Other Ambulatory Visit: Payer: Self-pay | Admitting: Family Medicine

## 2014-01-21 ENCOUNTER — Encounter: Payer: Self-pay | Admitting: Family Medicine

## 2014-01-21 ENCOUNTER — Ambulatory Visit (INDEPENDENT_AMBULATORY_CARE_PROVIDER_SITE_OTHER): Payer: PRIVATE HEALTH INSURANCE | Admitting: Family Medicine

## 2014-01-21 VITALS — BP 100/60 | Ht 68.5 in | Wt 156.0 lb

## 2014-01-21 DIAGNOSIS — I10 Essential (primary) hypertension: Secondary | ICD-10-CM

## 2014-01-21 DIAGNOSIS — E1140 Type 2 diabetes mellitus with diabetic neuropathy, unspecified: Secondary | ICD-10-CM

## 2014-01-21 DIAGNOSIS — F329 Major depressive disorder, single episode, unspecified: Secondary | ICD-10-CM | POA: Insufficient documentation

## 2014-01-21 DIAGNOSIS — D631 Anemia in chronic kidney disease: Secondary | ICD-10-CM

## 2014-01-21 DIAGNOSIS — E785 Hyperlipidemia, unspecified: Secondary | ICD-10-CM

## 2014-01-21 DIAGNOSIS — E119 Type 2 diabetes mellitus without complications: Secondary | ICD-10-CM

## 2014-01-21 DIAGNOSIS — N184 Chronic kidney disease, stage 4 (severe): Secondary | ICD-10-CM

## 2014-01-21 DIAGNOSIS — F32A Depression, unspecified: Secondary | ICD-10-CM

## 2014-01-21 LAB — POCT GLYCOSYLATED HEMOGLOBIN (HGB A1C): Hemoglobin A1C: 5.7

## 2014-01-21 MED ORDER — CITALOPRAM HYDROBROMIDE 20 MG PO TABS: 20.0000 mg | ORAL_TABLET | Freq: Every day | ORAL | Status: DC

## 2014-01-21 MED ORDER — INSULIN NPH (HUMAN) (ISOPHANE) 100 UNIT/ML ~~LOC~~ SUSP: 10.0000 [IU] | Freq: Every day | SUBCUTANEOUS | Status: DC

## 2014-01-21 NOTE — Progress Notes (Signed)
   Subjective:    Patient ID: Paul Gonzalez, male    DOB: 1965/07/27, 49 y.o.   MRN: [id number]  Diabetes He presents for his follow-up diabetic visit. He has type 2 diabetes mellitus. His disease course has been stable. There are no hypoglycemic associated symptoms. There are no diabetic associated symptoms. There are no hypoglycemic complications. Symptoms are stable. There are no diabetic complications. There are no known risk factors for coronary artery disease. Current diabetic treatment includes insulin injections. He is compliant with treatment all of the time.   Patient states that he has been feeling dizzy and nauseated for about 2 weeks now. No other symptoms. Treatments tried: none. Every time he tries to do something feels wek and dizzy  History of progressive renal failure. Currently followed by Dr. Nelia Shi. Claims compliance with blood pressure medication.  Also experiencing substantial anemia. See hematologist note. Felt to be due to renal failure.  Recently had operation for diabetic retinopathy.  Feels down at times not suicidal  infusio of iron  Results for orders placed or performed in visit on 01/21/14  POCT glycosylated hemoglobin (Hb A1C)  Result Value Ref Range   Hemoglobin A1C 5.7   no low sugar spells .  Review of Systems no headache no chest pain no shortness of breath no abdominal pain no change in bowel habits no blood in stool    Objective:   Physical Exam   alert no acute distress. Vitals stable blood pressure good 1:30 to over 78 HEENT in T normal. Lungs clear. Heart regular in rhythm. Ankles without edema. Feet sensation intact today despite history of neuropathy      Assessment & Plan:   impression 1 type 2 diabetes good control #2 profound noncompliance history with many years of poor control #3 progressive renal failure stage IV #4 diabetic retinopathy discussed #5 and sensory neuropathy has calm down #6 hypertension good control #7 depression  present discussed. Fair control with current meds. #8 hyperlipidemia status uncertain. Patient has not gone and gotten blood work is requested. Plan diet discussed. Exercise discussed compliance discussed. Recheck as scheduled. WSL

## 2014-01-22 ENCOUNTER — Other Ambulatory Visit: Payer: Self-pay

## 2014-01-22 DIAGNOSIS — N185 Chronic kidney disease, stage 5: Secondary | ICD-10-CM

## 2014-01-22 DIAGNOSIS — Z01810 Encounter for preprocedural cardiovascular examination: Secondary | ICD-10-CM

## 2014-01-31 ENCOUNTER — Other Ambulatory Visit (HOSPITAL_COMMUNITY): Payer: Self-pay

## 2014-02-01 ENCOUNTER — Encounter (HOSPITAL_BASED_OUTPATIENT_CLINIC_OR_DEPARTMENT_OTHER): Payer: Managed Care, Other (non HMO)

## 2014-02-01 ENCOUNTER — Encounter (HOSPITAL_COMMUNITY): Payer: Managed Care, Other (non HMO)

## 2014-02-01 ENCOUNTER — Encounter (HOSPITAL_COMMUNITY): Payer: Self-pay | Admitting: Hematology & Oncology

## 2014-02-01 ENCOUNTER — Encounter (HOSPITAL_BASED_OUTPATIENT_CLINIC_OR_DEPARTMENT_OTHER): Payer: Managed Care, Other (non HMO) | Admitting: Hematology & Oncology

## 2014-02-01 DIAGNOSIS — D649 Anemia, unspecified: Secondary | ICD-10-CM

## 2014-02-01 DIAGNOSIS — N184 Chronic kidney disease, stage 4 (severe): Secondary | ICD-10-CM

## 2014-02-01 DIAGNOSIS — I129 Hypertensive chronic kidney disease with stage 1 through stage 4 chronic kidney disease, or unspecified chronic kidney disease: Secondary | ICD-10-CM | POA: Diagnosis not present

## 2014-02-01 DIAGNOSIS — D631 Anemia in chronic kidney disease: Secondary | ICD-10-CM | POA: Diagnosis present

## 2014-02-01 DIAGNOSIS — E119 Type 2 diabetes mellitus without complications: Secondary | ICD-10-CM

## 2014-02-01 DIAGNOSIS — Z794 Long term (current) use of insulin: Secondary | ICD-10-CM | POA: Diagnosis not present

## 2014-02-01 DIAGNOSIS — E785 Hyperlipidemia, unspecified: Secondary | ICD-10-CM | POA: Diagnosis not present

## 2014-02-01 DIAGNOSIS — I10 Essential (primary) hypertension: Secondary | ICD-10-CM

## 2014-02-01 LAB — CBC WITH DIFFERENTIAL/PLATELET
Basophils Absolute: 0.1 10*3/uL (ref 0.0–0.1)
Basophils Relative: 1 % (ref 0–1)
Eosinophils Absolute: 0.5 10*3/uL (ref 0.0–0.7)
Eosinophils Relative: 8 % — ABNORMAL HIGH (ref 0–5)
HEMATOCRIT: 24.6 % — AB (ref 39.0–52.0)
HEMOGLOBIN: 8.3 g/dL — AB (ref 13.0–17.0)
LYMPHS ABS: 0.8 10*3/uL (ref 0.7–4.0)
LYMPHS PCT: 12 % (ref 12–46)
MCH: 30.5 pg (ref 26.0–34.0)
MCHC: 33.7 g/dL (ref 30.0–36.0)
MCV: 90.4 fL (ref 78.0–100.0)
MONO ABS: 0.4 10*3/uL (ref 0.1–1.0)
Monocytes Relative: 6 % (ref 3–12)
NEUTROS PCT: 73 % (ref 43–77)
Neutro Abs: 4.9 10*3/uL (ref 1.7–7.7)
Platelets: 225 10*3/uL (ref 150–400)
RBC: 2.72 MIL/uL — ABNORMAL LOW (ref 4.22–5.81)
RDW: 12.5 % (ref 11.5–15.5)
WBC: 6.7 10*3/uL (ref 4.0–10.5)

## 2014-02-01 MED ORDER — DARBEPOETIN ALFA 40 MCG/0.4ML IJ SOSY
PREFILLED_SYRINGE | INTRAMUSCULAR | Status: AC
  Filled 2014-02-01: qty 0.4

## 2014-02-01 MED ORDER — DARBEPOETIN ALFA 40 MCG/0.4ML IJ SOSY
30.0000 ug | PREFILLED_SYRINGE | Freq: Once | INTRAMUSCULAR | Status: AC
  Administered 2014-02-01: 30 ug via SUBCUTANEOUS

## 2014-02-01 NOTE — Progress Notes (Signed)
LABS FOR CBCD 

## 2014-02-01 NOTE — Progress Notes (Signed)
Paul Gonzalez's reason for visit today is for an injection and labs as scheduled per MD orders.  Labs were drawn prior to administration of ordered medication.   Paul Gonzalez also received aranesp 30 mcg per MD orders; see [facility] for administration details.  Paul Gonzalez tolerated all procedures well and without incident; questions were answered and patient was discharged.
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
## 2014-02-01 NOTE — Progress Notes (Signed)
Please see doctors encounter for more information 

## 2014-02-01 NOTE — Patient Instructions (Signed)
[place] at [facility]  Discharge Instructions:  Please call with any problems prior to your next visit WE will continue your shots as ordered I will see you back in 3 months but we will continue monthly lab and aranesp. We will replace your iron as needed to keep your ferritin greater than 100 _______________________________________________________________  Thank you for choosing [place] at [facility] to provide your oncology and hematology care.  To afford each patient quality time with our providers, please arrive at least 15 minutes before your scheduled appointment.  You need to re-schedule your appointment if you arrive 10 or more minutes late.  We strive to give you quality time with our providers, and arriving late affects you and other patients whose appointments are after yours.  Also, if you no show three or more times for appointments you may be dismissed from the clinic.  Again, thank you for choosing [place] [facility] hope is that these requests will allow you access to exceptional care and in a timely manner. _______________________________________________________________  If you have questions after your visit, please contact our office at [phone] between the hours of 8:30 a.m. and 5:00 p.m. Voicemails left after 4:30 p.m. will not be returned until the following business day. _______________________________________________________________  For prescription refill requests, have your pharmacy contact our office. _______________________________________________________________  Recommendations made by the consultant and any test results will be sent to your referring physician. _______________________________________________________________

## 2014-02-08 ENCOUNTER — Encounter: Payer: Self-pay | Admitting: Surgery

## 2014-02-11 ENCOUNTER — Encounter: Payer: Self-pay | Admitting: Surgery

## 2014-02-11 ENCOUNTER — Ambulatory Visit (HOSPITAL_COMMUNITY)
Admission: RE | Admit: 2014-02-11 | Discharge: 2014-02-11 | Disposition: A | Discharge status: Home / Self Care | Payer: Managed Care, Other (non HMO) | Source: Ambulatory Visit | Attending: Surgery | Admitting: Surgery

## 2014-02-11 ENCOUNTER — Ambulatory Visit (INDEPENDENT_AMBULATORY_CARE_PROVIDER_SITE_OTHER): Payer: Managed Care, Other (non HMO) | Admitting: Surgery

## 2014-02-11 ENCOUNTER — Ambulatory Visit (INDEPENDENT_AMBULATORY_CARE_PROVIDER_SITE_OTHER)
Admission: RE | Admit: 2014-02-11 | Discharge: 2014-02-11 | Disposition: A | Discharge status: Home / Self Care | Payer: Managed Care, Other (non HMO) | Source: Ambulatory Visit | Attending: Surgery | Admitting: Surgery

## 2014-02-11 VITALS — BP 112/96 | HR 63 | Resp 16 | Ht 70.0 in | Wt 160.0 lb

## 2014-02-11 DIAGNOSIS — N185 Chronic kidney disease, stage 5: Secondary | ICD-10-CM

## 2014-02-11 DIAGNOSIS — Z01810 Encounter for preprocedural cardiovascular examination: Secondary | ICD-10-CM | POA: Diagnosis not present
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
## 2014-02-14 LAB — LIPID PANEL
CHOL/HDL RATIO: 3.8 ratio
Cholesterol: 161 mg/dL (ref 0–200)
HDL: 42 mg/dL (ref >39)
LDL Cholesterol: 102 mg/dL — ABNORMAL HIGH (ref 0–99)
Triglycerides: 84 mg/dL (ref <150)
VLDL: 17 mg/dL (ref 0–40)

## 2014-02-16 NOTE — Assessment & Plan Note (Signed)
This is a pleasant 49 year old male with stage IV chronic kidney disease and severe anemia. We have just started him on [name] in accordance with renal dosing. I discussed with him the guidelines for following his blood counts and making changes in his [name] dosing. I will continue to follow his counts closely and if we do not see an improvement in his hemoglobin by at least 1 g/dL at his next visit I will increase his [name] accordingly. We will continue with ferritin levels on a 6 week basis and replace him with IV iron to keep his ferritin level greater than or equal to 100. Advised him to let us know if he develops any problems prior to his next established appointment with me but otherwise we will continue forward with the plan as outlined.

## 2014-02-18 ENCOUNTER — Other Ambulatory Visit: Payer: Self-pay

## 2014-02-18 ENCOUNTER — Encounter: Payer: Self-pay | Admitting: Family Medicine

## 2014-02-22 ENCOUNTER — Encounter: Payer: Self-pay | Admitting: Nephrology

## 2014-02-27 ENCOUNTER — Encounter (HOSPITAL_COMMUNITY): Payer: Self-pay | Admitting: *Deleted

## 2014-02-27 MED ORDER — CHLORHEXIDINE GLUCONATE CLOTH 2 % EX PADS: 6.0000 | MEDICATED_PAD | Freq: Once | CUTANEOUS | Status: DC

## 2014-02-27 MED ORDER — SODIUM CHLORIDE 0.9 % IV SOLN
INTRAVENOUS | Status: DC
  Administered 2014-02-28: 07:00:00 via INTRAVENOUS

## 2014-02-27 MED ORDER — DEXTROSE 5 % IV SOLN
1.5000 g | INTRAVENOUS | Status: AC
  Administered 2014-02-28: 1.5 g via INTRAVENOUS
  Filled 2014-02-27: qty 1.5

## 2014-02-28 ENCOUNTER — Other Ambulatory Visit: Payer: Self-pay | Admitting: *Deleted

## 2014-02-28 ENCOUNTER — Telehealth: Payer: Self-pay | Admitting: Surgery

## 2014-02-28 ENCOUNTER — Ambulatory Visit (HOSPITAL_COMMUNITY)
Admission: RE | Admit: 2014-02-28 | Discharge: 2014-02-28 | Disposition: A | Discharge status: Home / Self Care | Payer: Managed Care, Other (non HMO) | Source: Ambulatory Visit | Attending: Surgery | Admitting: Surgery

## 2014-02-28 ENCOUNTER — Encounter (HOSPITAL_COMMUNITY)
Admission: RE | Disposition: A | Discharge status: Home / Self Care | Payer: Self-pay | Source: Ambulatory Visit | Attending: Surgery

## 2014-02-28 ENCOUNTER — Ambulatory Visit (HOSPITAL_COMMUNITY): Payer: Managed Care, Other (non HMO) | Admitting: Vascular Surgery

## 2014-02-28 ENCOUNTER — Encounter (HOSPITAL_COMMUNITY): Payer: Self-pay | Admitting: *Deleted

## 2014-02-28 DIAGNOSIS — I70208 Unspecified atherosclerosis of native arteries of extremities, other extremity: Secondary | ICD-10-CM | POA: Diagnosis not present

## 2014-02-28 DIAGNOSIS — N186 End stage renal disease: Secondary | ICD-10-CM

## 2014-02-28 DIAGNOSIS — I120 Hypertensive chronic kidney disease with stage 5 chronic kidney disease or end stage renal disease: Secondary | ICD-10-CM | POA: Insufficient documentation

## 2014-02-28 DIAGNOSIS — Z4931 Encounter for adequacy testing for hemodialysis: Secondary | ICD-10-CM

## 2014-02-28 DIAGNOSIS — E1122 Type 2 diabetes mellitus with diabetic chronic kidney disease: Secondary | ICD-10-CM | POA: Insufficient documentation

## 2014-02-28 DIAGNOSIS — N185 Chronic kidney disease, stage 5: Secondary | ICD-10-CM

## 2014-02-28 DIAGNOSIS — Z79899 Other long term (current) drug therapy: Secondary | ICD-10-CM | POA: Insufficient documentation

## 2014-02-28 DIAGNOSIS — E785 Hyperlipidemia, unspecified: Secondary | ICD-10-CM | POA: Insufficient documentation

## 2014-02-28 DIAGNOSIS — Z794 Long term (current) use of insulin: Secondary | ICD-10-CM | POA: Diagnosis not present

## 2014-02-28 DIAGNOSIS — F329 Major depressive disorder, single episode, unspecified: Secondary | ICD-10-CM | POA: Diagnosis not present

## 2014-02-28 HISTORY — DX: Pneumonia, unspecified organism: J18.9

## 2014-02-28 HISTORY — PX: AV FISTULA PLACEMENT: SHX1204

## 2014-02-28 LAB — POCT I-STAT 4, (NA,K, GLUC, HGB,HCT)
GLUCOSE: 71 mg/dL (ref 70–99)
HEMATOCRIT: 26 % — AB (ref 39.0–52.0)
HEMOGLOBIN: 8.8 g/dL — AB (ref 13.0–17.0)
POTASSIUM: 4.6 mmol/L (ref 3.5–5.1)
SODIUM: 140 mmol/L (ref 135–145)

## 2014-02-28 LAB — GLUCOSE, CAPILLARY
GLUCOSE-CAPILLARY: 83 mg/dL (ref 70–99)
Glucose-Capillary: 85 mg/dL (ref 70–99)
Glucose-Capillary: 88 mg/dL (ref 70–99)

## 2014-02-28 SURGERY — ARTERIOVENOUS (AV) FISTULA CREATION
Anesthesia: Monitor Anesthesia Care | Site: Arm Lower | Laterality: Left

## 2014-02-28 MED ORDER — FENTANYL CITRATE 0.05 MG/ML IJ SOLN
INTRAMUSCULAR | Status: DC | PRN
  Administered 2014-02-28 (×2): 25 ug via INTRAVENOUS
  Administered 2014-02-28: 50 ug via INTRAVENOUS
  Administered 2014-02-28: 25 ug via INTRAVENOUS

## 2014-02-28 MED ORDER — ONDANSETRON HCL 4 MG/2ML IJ SOLN
INTRAMUSCULAR | Status: DC | PRN
Start: 2014-02-28 — End: 2014-02-28
  Administered 2014-02-28: 4 mg via INTRAVENOUS

## 2014-02-28 MED ORDER — PROTAMINE SULFATE 10 MG/ML IV SOLN
INTRAVENOUS | Status: DC | PRN
  Administered 2014-02-28: 20 mg via INTRAVENOUS
  Administered 2014-02-28: 5 mg via INTRAVENOUS

## 2014-02-28 MED ORDER — PROTAMINE SULFATE 10 MG/ML IV SOLN
INTRAVENOUS | Status: AC
  Filled 2014-02-28: qty 5

## 2014-02-28 MED ORDER — LIDOCAINE-EPINEPHRINE (PF) 1 %-1:200000 IJ SOLN
INTRAMUSCULAR | Status: AC
  Filled 2014-02-28: qty 10

## 2014-02-28 MED ORDER — LIDOCAINE-EPINEPHRINE (PF) 1 %-1:200000 IJ SOLN
INTRAMUSCULAR | Status: DC | PRN
Start: 2014-02-28 — End: 2014-02-28
  Administered 2014-02-28: 8 mL

## 2014-02-28 MED ORDER — 0.9 % SODIUM CHLORIDE (POUR BTL) OPTIME
TOPICAL | Status: DC | PRN
  Administered 2014-02-28: 1000 mL

## 2014-02-28 MED ORDER — HEPARIN SODIUM (PORCINE) 1000 UNIT/ML IJ SOLN
INTRAMUSCULAR | Status: AC
  Filled 2014-02-28: qty 1

## 2014-02-28 MED ORDER — MIDAZOLAM HCL 2 MG/2ML IJ SOLN
INTRAMUSCULAR | Status: AC
  Filled 2014-02-28: qty 2

## 2014-02-28 MED ORDER — MIDAZOLAM HCL 5 MG/5ML IJ SOLN
INTRAMUSCULAR | Status: DC | PRN
  Administered 2014-02-28: 2 mg via INTRAVENOUS

## 2014-02-28 MED ORDER — PROPOFOL 10 MG/ML IV BOLUS
INTRAVENOUS | Status: DC | PRN
  Administered 2014-02-28: 20 mg via INTRAVENOUS

## 2014-02-28 MED ORDER — LIDOCAINE HCL (CARDIAC) 20 MG/ML IV SOLN
INTRAVENOUS | Status: DC | PRN
  Administered 2014-02-28: 20 mg via INTRAVENOUS

## 2014-02-28 MED ORDER — PROPOFOL INFUSION 10 MG/ML OPTIME
INTRAVENOUS | Status: DC | PRN
  Administered 2014-02-28: 50 ug/kg/min via INTRAVENOUS

## 2014-02-28 MED ORDER — OXYCODONE HCL 5 MG PO TABS: 5.0000 mg | ORAL_TABLET | Freq: Four times a day (QID) | ORAL | Status: DC | PRN

## 2014-02-28 MED ORDER — HEPARIN SODIUM (PORCINE) 5000 UNIT/ML IJ SOLN
INTRAMUSCULAR | Status: DC | PRN
  Administered 2014-02-28: 500 mL

## 2014-02-28 MED ORDER — FENTANYL CITRATE 0.05 MG/ML IJ SOLN
INTRAMUSCULAR | Status: AC
  Filled 2014-02-28: qty 5

## 2014-02-28 MED ORDER — HEPARIN SODIUM (PORCINE) 1000 UNIT/ML IJ SOLN
INTRAMUSCULAR | Status: DC | PRN
  Administered 2014-02-28: 3000 [IU] via INTRAVENOUS

## 2014-02-28 SURGICAL SUPPLY — 35 items
ARMBAND PINK RESTRICT EXTREMIT (MISCELLANEOUS) ×3 IMPLANT
BLADE SURG 10 STRL SS (BLADE) IMPLANT
CANISTER SUCTION 2500CC (MISCELLANEOUS) ×3 IMPLANT
CLIP TI MEDIUM 6 (CLIP) ×3 IMPLANT
CLIP TI WIDE RED SMALL 6 (CLIP) ×3 IMPLANT
COVER PROBE W GEL 5X96 (DRAPES) ×3 IMPLANT
COVER SURGICAL LIGHT HANDLE (MISCELLANEOUS) IMPLANT
ELECT REM PT RETURN 9FT ADLT (ELECTROSURGICAL) ×3
ELECTRODE REM PT RTRN 9FT ADLT (ELECTROSURGICAL) ×1 IMPLANT
GLOVE BIO SURGEON STRL SZ 6.5 (GLOVE) ×4 IMPLANT
GLOVE BIO SURGEONS STRL SZ 6.5 (GLOVE) ×2
GLOVE BIOGEL PI IND STRL 6.5 (GLOVE) ×2 IMPLANT
GLOVE BIOGEL PI IND STRL 7.5 (GLOVE) ×1 IMPLANT
GLOVE BIOGEL PI INDICATOR 6.5 (GLOVE) ×4
GLOVE BIOGEL PI INDICATOR 7.5 (GLOVE) ×2
GLOVE ECLIPSE 7.0 STRL STRAW (GLOVE) ×6 IMPLANT
GLOVE SURG SS PI 7.5 STRL IVOR (GLOVE) ×3 IMPLANT
GOWN STRL REUS W/ TWL LRG LVL3 (GOWN DISPOSABLE) ×1 IMPLANT
GOWN STRL REUS W/ TWL XL LVL3 (GOWN DISPOSABLE) ×3 IMPLANT
GOWN STRL REUS W/TWL LRG LVL3 (GOWN DISPOSABLE) ×2
GOWN STRL REUS W/TWL XL LVL3 (GOWN DISPOSABLE) ×6
HEMOSTAT SNOW SURGICEL 2X4 (HEMOSTASIS) IMPLANT
KIT BASIN OR (CUSTOM PROCEDURE TRAY) ×3 IMPLANT
KIT ROOM TURNOVER OR (KITS) ×3 IMPLANT
LIQUID BAND (GAUZE/BANDAGES/DRESSINGS) ×3 IMPLANT
NS IRRIG 1000ML POUR BTL (IV SOLUTION) ×3 IMPLANT
PACK CV ACCESS (CUSTOM PROCEDURE TRAY) ×3 IMPLANT
PAD ARMBOARD 7.5X6 YLW CONV (MISCELLANEOUS) ×6 IMPLANT
PROBE PENCIL 8 MHZ STRL DISP (MISCELLANEOUS) ×3 IMPLANT
SUT PROLENE 6 0 CC (SUTURE) ×6 IMPLANT
SUT VIC AB 3-0 SH 27 (SUTURE) ×2
SUT VIC AB 3-0 SH 27X BRD (SUTURE) ×1 IMPLANT
SUT VICRYL 4-0 PS2 18IN ABS (SUTURE) ×3 IMPLANT
UNDERPAD 30X30 INCONTINENT (UNDERPADS AND DIAPERS) ×3 IMPLANT
WATER STERILE IRR 1000ML POUR (IV SOLUTION) ×3 IMPLANT

## 2014-02-28 NOTE — Telephone Encounter (Signed)
LM for pt re appt, dpm °

## 2014-02-28 NOTE — Progress Notes (Signed)
Report given to angel britt rn

## 2014-02-28 NOTE — Op Note (Signed)
    Patient name: Paul Gonzalez MRN: [id number] DOB: Oct 05, 1965 Sex: male  02/28/2014 Pre-operative Diagnosis: End stage renal disease Post-operative diagnosis:  Same Surgeon:  Eldridge Abrahams Assistants:  Leontine Locket Procedure:   Left radiocephalic fistula Anesthesia:  Mac Blood Loss:  See anesthesia record Specimens:  None   Findings:  calcified 2 mm radial artery.  Excellent vein, dilating to 4 mm  Indications:  The patient is here for dialysis access.  He is not yet on dialysis.  Preoperative vein mapping showed an adequate left cephalic vein  Procedure:  The patient was identified in the holding area and taken to [place] 12  The patient was then placed supine on the table. MAC anesthesia was administered.  The patient was prepped and draped in the usual sterile fashion.  A time out was called and antibiotics were administered.  Ultrasound was used to evaluate the left cephalic vein.  It appeared to be adequate.  One percent lidocaine was used for local anesthesia.  A longitudinal incision was made in the left wrist area.  I sharply dissected out the cephalic vein.  This was a healthy appearing 3 mm vein.  It was fully mobilized and marked with an ink pen for orientation.  Next I dissected out the radial artery.  This was moderately calcified and approximately 2 mm.  3000 units of heparin were given.  The vein was then ligated distally.  It distended nicely with heparinized saline to approximate 4 mm.  Next, the radial artery was occluded with vascular clamps and #11 blade was used to make an arteriotomy which was extended with Potts scissors.  A running end-to-side anastomosis was created with 6-0 Prolene.  Prior to completion, the appropriate flushing maneuvers were performed and the anastomosis was completed.  I inspected the course of the vein to make sure there.  There was a palpable thrill within the fistula.  Multiphasic signals in the radial and ulnar artery that did not  change significantly with fistula compression.  25 mg of protamine was administered.  After hemostasis was satisfactory the incision was closed with 2 layers of Vicryl followed by Dermabond.   Disposition:  To PACU in stable condition.   Theotis Burrow, M.D. [facility] Office: [phone] Pager:  [phone]

## 2014-02-28 NOTE — Anesthesia Postprocedure Evaluation (Signed)
Anesthesia Post Note  Patient: Paul Gonzalez  Procedure(s) Performed: Procedure(s) (LRB): CREATION LEFT RADIO-CEPHALIC ARTERIOVENOUS (AV) FISTULA  (Left)  Anesthesia type: MAC  Patient location: PACU  Post pain: Pain level controlled  Post assessment: Patient's Cardiovascular Status Stable  Last Vitals:  Filed Vitals:   02/28/14 1242  BP:   Pulse: 73  Temp:   Resp:     Post vital signs: Reviewed and stable  Level of consciousness: sedated  Complications: No apparent anesthesia complications

## 2014-02-28 NOTE — Anesthesia Preprocedure Evaluation (Addendum)
Anesthesia Evaluation  Patient identified by MRN, date of birth, ID band Patient awake    Reviewed: Allergy & Precautions, NPO status , Patient's Chart, lab work & pertinent test results  History of Anesthesia Complications Negative for: history of anesthetic complications  Airway Mallampati: II  TM Distance: >3 FB Neck ROM: Full    Dental  (+) Poor Dentition, Dental Advisory Given   Pulmonary neg pulmonary ROS,    Pulmonary exam normal       Cardiovascular hypertension, Pt. on medications     Neuro/Psych PSYCHIATRIC DISORDERS Depression negative neurological ROS     GI/Hepatic negative GI ROS, Neg liver ROS,   Endo/Other  diabetes, Insulin Dependent  Renal/GU ESRFRenal disease     Musculoskeletal   Abdominal   Peds  Hematology   Anesthesia Other Findings   Reproductive/Obstetrics                            Anesthesia Physical Anesthesia Plan  ASA: III  Anesthesia Plan: MAC   Post-op Pain Management:    Induction: Intravenous  Airway Management Planned: Simple Face Mask  Additional Equipment:   Intra-op Plan:   Post-operative Plan: Extubation in OR  Informed Consent: I have reviewed the patients History and Physical, chart, labs and discussed the procedure including the risks, benefits and alternatives for the proposed anesthesia with the patient or authorized representative who has indicated his/her understanding and acceptance.   Dental advisory given  Plan Discussed with: CRNA, Anesthesiologist and Surgeon  Anesthesia Plan Comments:        Anesthesia Quick Evaluation
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
## 2014-02-28 NOTE — Interval H&P Note (Signed)
History and Physical Interval Note:  02/28/2014 9:38 AM  Paul Gonzalez  has presented today for surgery, with the diagnosis of End Stage Renal Disease N18.6  The various methods of treatment have been discussed with the patient and family. After consideration of risks, benefits and other options for treatment, the patient has consented to  Procedure(s): ARTERIOVENOUS (AV) FISTULA CREATION (Left) as a surgical intervention .  The patient's history has been reviewed, patient examined, no change in status, stable for surgery.  I have reviewed the patient's chart and labs.  Questions were answered to the patient's satisfaction.     [name] IV, V. WELLS

## 2014-02-28 NOTE — Transfer of Care (Signed)
Immediate Anesthesia Transfer of Care Note  Patient: Paul Gonzalez  Procedure(s) Performed: Procedure(s): CREATION LEFT RADIO-CEPHALIC ARTERIOVENOUS (AV) FISTULA  (Left)  Patient Location: PACU  Anesthesia Type:MAC  Level of Consciousness: awake, alert  and oriented  Airway & Oxygen Therapy: Patient Spontanous Breathing  Post-op Assessment: Report given to RN and Post -op Vital signs reviewed and stable  Post vital signs: Reviewed and stable  Last Vitals:  Filed Vitals:   02/28/14 0717  BP: 118/92  Pulse: 62  Temp: 36.4 C  Resp: 18    Complications: No apparent anesthesia complications

## 2014-02-28 NOTE — Telephone Encounter (Signed)
-----   Message from Gabriel Earing, [place] sent at 02/28/2014 11:08 AM EST ----- S/p left RC AVF 02/28/14.  F/u with Dr. Trula Slade in 6 weeks with duplex.  Thanks, Aldona Bar

## 2014-02-28 NOTE — Discharge Instructions (Signed)
02/28/2014 Paul Gonzalez [id number] [birth year]  Surgeon(s): Serafina Mitchell, MD  Procedure(s): CREATION LEFT RADIO-CEPHALIC ARTERIOVENOUS (AV) FISTULA    Do not stick fistula for 12 weeks   General Anesthesia, Adult, Care After  Refer to this sheet in the next few weeks. These instructions provide you with information on caring for yourself after your procedure. Your health care provider may also give you more specific instructions. Your treatment has been planned according to current medical practices, but problems sometimes occur. Call your health care provider if you have any problems or questions after your procedure.  WHAT TO EXPECT AFTER THE PROCEDURE  After the procedure, it is typical to experience:  Sleepiness.  Nausea and vomiting. HOME CARE INSTRUCTIONS  For the first 24 hours after general anesthesia:  Have a responsible person with you.  Do not drive a car. If you are alone, do not take public transportation.  Do not drink alcohol.  Do not take medicine that has not been prescribed by your health care provider.  Do not sign important papers or make important decisions.  You may resume a normal diet and activities as directed by your health care provider.  Change bandages (dressings) as directed.  If you have questions or problems that seem related to general anesthesia, call the hospital and ask for the anesthetist or anesthesiologist on call. SEEK MEDICAL CARE IF:  You have nausea and vomiting that continue the day after anesthesia.  You develop a rash. SEEK IMMEDIATE MEDICAL CARE IF:  You have difficulty breathing.  You have chest pain.  You have any allergic problems. Document Released: 03/29/2000 Document Revised: 08/23/2012 Document Reviewed: 07/06/2012  [facility] Patient Information 2014 [place], [place].   Laceration Care, Adult    A laceration is a cut that goes through all layers of the skin. The cut goes into the tissue beneath the skin.  HOME CARE   For stitches (sutures) or staples:  Keep the cut clean and dry.  If you have a bandage (dressing), change it at least once a day. Change the bandage if it gets wet or dirty, or as told by your doctor.  Wash the cut with soap and water 2 times a day. Rinse the cut with water. Pat it dry with a clean towel.  Put a thin layer of medicated cream on the cut as told by your doctor.  You may shower after the first 24 hours. Do not soak the cut in water until the stitches are removed.  Only take medicines as told by your doctor.  Have your stitches or staples removed as told by your doctor. For skin adhesive strips:  Keep the cut clean and dry.  Do not get the strips wet. You may take a bath, but be careful to keep the cut dry.  If the cut gets wet, pat it dry with a clean towel.  The strips will fall off on their own. Do not remove the strips that are still stuck to the cut. For wound glue:  You may shower or take baths. Do not soak or scrub the cut. Do not swim. Avoid heavy sweating until the glue falls off on its own. After a shower or bath, pat the cut dry with a clean towel.  Do not put medicine on your cut until the glue falls off.  If you have a bandage, do not put tape over the glue.  Avoid lots of sunlight or tanning lamps until the glue falls off. Put sunscreen  on the cut for the first year to reduce your scar.  The glue will fall off on its own. Do not pick at the glue. You may need a tetanus shot if:  You cannot remember when you had your last tetanus shot.  You have never had a tetanus shot. If you need a tetanus shot and you choose not to have one, you may get tetanus. Sickness from tetanus can be serious.  GET HELP RIGHT AWAY IF:  Your pain does not get better with medicine.  Your arm, hand, leg, or foot loses feeling (numbness) or changes color.  Your cut is bleeding.  Your joint feels weak, or you cannot use your joint.  You have painful lumps on your body.  Your cut is red,  puffy (swollen), or painful.  You have a red line on the skin near the cut.  You have yellowish-white fluid (pus) coming from the cut.  You have a fever.  You have a bad smell coming from the cut or bandage.  Your cut breaks open before or after stitches are removed.  You notice something coming out of the cut, such as wood or glass.  You cannot move a finger or toe. MAKE SURE YOU:  Understand these instructions.  Will watch your condition.  Will get help right away if you are not doing well or get worse. Document Released: 06/09/2007 Document Revised: 03/15/2011 Document Reviewed: 06/16/2010  [facility] Patient Information 2014 [place].

## 2014-02-28 NOTE — Progress Notes (Signed)
Called Dr. Tobias Alexander for sign out

## 2014-03-01 ENCOUNTER — Encounter (HOSPITAL_COMMUNITY): Payer: Managed Care, Other (non HMO)

## 2014-03-01 ENCOUNTER — Other Ambulatory Visit (HOSPITAL_COMMUNITY): Payer: Managed Care, Other (non HMO)

## 2014-03-01 ENCOUNTER — Encounter (HOSPITAL_COMMUNITY): Payer: Self-pay | Admitting: Surgery

## 2014-03-04 ENCOUNTER — Other Ambulatory Visit (HOSPITAL_COMMUNITY): Payer: Self-pay

## 2014-03-04 DIAGNOSIS — D631 Anemia in chronic kidney disease: Secondary | ICD-10-CM

## 2014-03-04 DIAGNOSIS — N184 Chronic kidney disease, stage 4 (severe): Principal | ICD-10-CM

## 2014-03-05 ENCOUNTER — Encounter (HOSPITAL_COMMUNITY): Payer: Managed Care, Other (non HMO) | Attending: Hematology & Oncology

## 2014-03-05 ENCOUNTER — Encounter (HOSPITAL_COMMUNITY): Payer: Self-pay

## 2014-03-05 ENCOUNTER — Encounter (HOSPITAL_COMMUNITY): Payer: Managed Care, Other (non HMO)

## 2014-03-05 VITALS — BP 97/48 | HR 70 | Temp 98.1°F | Resp 18

## 2014-03-05 DIAGNOSIS — N184 Chronic kidney disease, stage 4 (severe): Secondary | ICD-10-CM | POA: Insufficient documentation

## 2014-03-05 DIAGNOSIS — D62 Acute posthemorrhagic anemia: Secondary | ICD-10-CM

## 2014-03-05 DIAGNOSIS — I129 Hypertensive chronic kidney disease with stage 1 through stage 4 chronic kidney disease, or unspecified chronic kidney disease: Secondary | ICD-10-CM | POA: Diagnosis not present

## 2014-03-05 DIAGNOSIS — Z794 Long term (current) use of insulin: Secondary | ICD-10-CM | POA: Diagnosis not present

## 2014-03-05 DIAGNOSIS — D631 Anemia in chronic kidney disease: Secondary | ICD-10-CM | POA: Diagnosis not present

## 2014-03-05 DIAGNOSIS — E785 Hyperlipidemia, unspecified: Secondary | ICD-10-CM | POA: Insufficient documentation

## 2014-03-05 DIAGNOSIS — E119 Type 2 diabetes mellitus without complications: Secondary | ICD-10-CM | POA: Insufficient documentation

## 2014-03-05 LAB — CBC WITH DIFFERENTIAL/PLATELET
Basophils Absolute: 0.1 10*3/uL (ref 0.0–0.1)
Basophils Relative: 1 % (ref 0–1)
EOS ABS: 0.3 10*3/uL (ref 0.0–0.7)
Eosinophils Relative: 5 % (ref 0–5)
HEMATOCRIT: 23.3 % — AB (ref 39.0–52.0)
HEMOGLOBIN: 7.7 g/dL — AB (ref 13.0–17.0)
Lymphocytes Relative: 15 % (ref 12–46)
Lymphs Abs: 0.8 10*3/uL (ref 0.7–4.0)
MCH: 30 pg (ref 26.0–34.0)
MCHC: 33 g/dL (ref 30.0–36.0)
MCV: 90.7 fL (ref 78.0–100.0)
MONO ABS: 0.5 10*3/uL (ref 0.1–1.0)
MONOS PCT: 9 % (ref 3–12)
Neutro Abs: 3.8 10*3/uL (ref 1.7–7.7)
Neutrophils Relative %: 70 % (ref 43–77)
Platelets: 211 10*3/uL (ref 150–400)
RBC: 2.57 MIL/uL — ABNORMAL LOW (ref 4.22–5.81)
RDW: 11.5 % (ref 11.5–15.5)
WBC: 5.4 10*3/uL (ref 4.0–10.5)

## 2014-03-05 LAB — ABO/RH: ABO/RH(D): O POS

## 2014-03-05 LAB — PREPARE RBC (CROSSMATCH)

## 2014-03-05 MED ORDER — DARBEPOETIN ALFA 40 MCG/0.4ML IJ SOSY
PREFILLED_SYRINGE | INTRAMUSCULAR | Status: AC
  Filled 2014-03-05: qty 0.4

## 2014-03-05 MED ORDER — DARBEPOETIN ALFA 40 MCG/0.4ML IJ SOSY
30.0000 ug | PREFILLED_SYRINGE | Freq: Once | INTRAMUSCULAR | Status: AC
  Administered 2014-03-05: 30 ug via SUBCUTANEOUS

## 2014-03-05 NOTE — Patient Instructions (Signed)
[place] at [facility]  Discharge Instructions:  [name] 30 mcg today.  Return Friday for 1 unit of blood at 9:15am.  Hemoglobin 7.7.  Please call the clinic if you have any questions or concerns _______________________________________________________________  Thank you for choosing [place] at [facility] to provide your oncology and hematology care.  To afford each patient quality time with our providers, please arrive at least 15 minutes before your scheduled appointment.  You need to re-schedule your appointment if you arrive 10 or more minutes late.  We strive to give you quality time with our providers, and arriving late affects you and other patients whose appointments are after yours.  Also, if you no show three or more times for appointments you may be dismissed from the clinic.  Again, thank you for choosing [place] at [place] hope is that these requests will allow you access to exceptional care and in a timely manner. _______________________________________________________________  If you have questions after your visit, please contact our office at [phone] between the hours of 8:30 a.m. and 5:00 p.m. Voicemails left after 4:30 p.m. will not be returned until the following business day. _______________________________________________________________  For prescription refill requests, have your pharmacy contact our office. _______________________________________________________________  Recommendations made by the consultant and any test results will be sent to your referring physician. _______________________________________________________________

## 2014-03-05 NOTE — Progress Notes (Signed)
Paul Gonzalez's reason for visit today is for an injection and labs as scheduled per MD orders.  Labs were drawn prior to administration of ordered medication.Paul Gonzalez also received aranesp 49mcg in right abdomen sq per MD orders; see [facility] for administration details.  Paul Gonzalez tolerated all procedures well and without incident; questions were answered and patient was discharged.  Paul Gonzalez's reason for visit today is for labs as scheduled per MD orders.  Venipuncture performed with a 23 gauge butterfly needle to R Antecubital.  Paul Gonzalez tolerated procedure well and without incident; questions were answered and patient was discharged.   Blood transfusion set up for Friday March 07, 2014 at 9:15 am.  Type and screen drawn today and blood bracelet applied to pt.

## 2014-03-06 LAB — FERRITIN: FERRITIN: 112 ng/mL (ref 22–322)

## 2014-03-06 NOTE — Progress Notes (Signed)
Labs for ferr,cbcd 

## 2014-03-07 ENCOUNTER — Other Ambulatory Visit (HOSPITAL_COMMUNITY): Payer: Self-pay | Admitting: Hematology & Oncology

## 2014-03-07 DIAGNOSIS — N184 Chronic kidney disease, stage 4 (severe): Principal | ICD-10-CM

## 2014-03-07 DIAGNOSIS — D631 Anemia in chronic kidney disease: Secondary | ICD-10-CM

## 2014-03-08 ENCOUNTER — Encounter (HOSPITAL_BASED_OUTPATIENT_CLINIC_OR_DEPARTMENT_OTHER): Payer: Managed Care, Other (non HMO)

## 2014-03-08 ENCOUNTER — Encounter (HOSPITAL_COMMUNITY): Payer: Self-pay

## 2014-03-08 DIAGNOSIS — D62 Acute posthemorrhagic anemia: Secondary | ICD-10-CM

## 2014-03-08 DIAGNOSIS — D631 Anemia in chronic kidney disease: Secondary | ICD-10-CM

## 2014-03-08 DIAGNOSIS — N184 Chronic kidney disease, stage 4 (severe): Secondary | ICD-10-CM

## 2014-03-08 MED ORDER — DIPHENHYDRAMINE HCL 25 MG PO CAPS
25.0000 mg | ORAL_CAPSULE | Freq: Once | ORAL | Status: AC
  Administered 2014-03-08: 25 mg via ORAL
  Filled 2014-03-08: qty 1

## 2014-03-08 MED ORDER — SODIUM CHLORIDE 0.9 % IV SOLN
250.0000 mL | Freq: Once | INTRAVENOUS | Status: AC
  Administered 2014-03-08: 250 mL via INTRAVENOUS

## 2014-03-08 MED ORDER — SODIUM CHLORIDE 0.9 % IJ SOLN: 10.0000 mL | INTRAMUSCULAR | Status: DC | PRN

## 2014-03-08 MED ORDER — ACETAMINOPHEN 325 MG PO TABS
650.0000 mg | ORAL_TABLET | Freq: Once | ORAL | Status: AC
  Administered 2014-03-08: 650 mg via ORAL
  Filled 2014-03-08: qty 2

## 2014-03-08 NOTE — Progress Notes (Signed)
Clare Charon Tolerated 1 unit of blood today.  Discharged ambulatory

## 2014-03-08 NOTE — Patient Instructions (Addendum)
[place] at [facility] Discharge Instructions  RECOMMENDATIONS MADE BY THE CONSULTANT AND ANY TEST RESULTS WILL BE SENT TO YOUR REFERRING PHYSICIAN.  You received one unit of blood today. Call if you have any questions.  Get your stool samples, and bring back when you can.    Thank you for choosing [place] at [facility] to provide your oncology and hematology care.  To afford each patient quality time with our provider, please arrive at least 15 minutes before your scheduled appointment time.    You need to re-schedule your appointment should you arrive 10 or more minutes late.  We strive to give you quality time with our providers, and arriving late affects you and other patients whose appointments are after yours.  Also, if you no show three or more times for appointments you may be dismissed from the clinic at the providers discretion.     Again, thank you for choosing [facility].  Our hope is that these requests will decrease the amount of time that you wait before being seen by our physicians.       _____________________________________________________________  Should you have questions after your visit to [facility], please contact our office at [phone] between the hours of 8:30 a.m. and 4:30 p.m.  Voicemails left after 4:30 p.m. will not be returned until the following business day.  For prescription refill requests, have your pharmacy contact our office.

## 2014-03-09 LAB — TYPE AND SCREEN
ABO/RH(D): O POS
Antibody Screen: NEGATIVE
Unit division: 0

## 2014-03-28 ENCOUNTER — Encounter (HOSPITAL_BASED_OUTPATIENT_CLINIC_OR_DEPARTMENT_OTHER): Payer: Managed Care, Other (non HMO)

## 2014-03-28 ENCOUNTER — Encounter (HOSPITAL_COMMUNITY): Payer: Self-pay

## 2014-03-28 ENCOUNTER — Other Ambulatory Visit (HOSPITAL_COMMUNITY): Payer: Self-pay | Admitting: Hematology & Oncology

## 2014-03-28 DIAGNOSIS — D631 Anemia in chronic kidney disease: Secondary | ICD-10-CM

## 2014-03-28 DIAGNOSIS — N184 Chronic kidney disease, stage 4 (severe): Secondary | ICD-10-CM

## 2014-03-28 LAB — CBC WITH DIFFERENTIAL/PLATELET
Basophils Absolute: 0.1 10*3/uL (ref 0.0–0.1)
Basophils Relative: 1 % (ref 0–1)
Eosinophils Absolute: 0.2 10*3/uL (ref 0.0–0.7)
Eosinophils Relative: 5 % (ref 0–5)
HCT: 25.2 % — ABNORMAL LOW (ref 39.0–52.0)
Hemoglobin: 8.4 g/dL — ABNORMAL LOW (ref 13.0–17.0)
Lymphocytes Relative: 18 % (ref 12–46)
Lymphs Abs: 0.9 10*3/uL (ref 0.7–4.0)
MCH: 30.1 pg (ref 26.0–34.0)
MCHC: 33.3 g/dL (ref 30.0–36.0)
MCV: 90.3 fL (ref 78.0–100.0)
MONO ABS: 0.4 10*3/uL (ref 0.1–1.0)
Monocytes Relative: 9 % (ref 3–12)
Neutro Abs: 3.3 10*3/uL (ref 1.7–7.7)
Neutrophils Relative %: 67 % (ref 43–77)
PLATELETS: 190 10*3/uL (ref 150–400)
RBC: 2.79 MIL/uL — AB (ref 4.22–5.81)
RDW: 12.5 % (ref 11.5–15.5)
WBC: 4.9 10*3/uL (ref 4.0–10.5)

## 2014-03-28 LAB — OCCULT BLOOD X 1 CARD TO LAB, STOOL
FECAL OCCULT BLD: NEGATIVE
Fecal Occult Bld: NEGATIVE
Fecal Occult Bld: NEGATIVE

## 2014-03-28 MED ORDER — DARBEPOETIN ALFA 40 MCG/0.4ML IJ SOSY
40.0000 ug | PREFILLED_SYRINGE | Freq: Once | INTRAMUSCULAR | Status: AC
  Administered 2014-03-28: 40 ug via SUBCUTANEOUS

## 2014-03-28 MED ORDER — DARBEPOETIN ALFA 40 MCG/0.4ML IJ SOSY
PREFILLED_SYRINGE | INTRAMUSCULAR | Status: AC
  Filled 2014-03-28: qty 0.4

## 2014-03-28 NOTE — Progress Notes (Signed)
LABS DRAWN, OCCULT CARDS DROPPED OFF

## 2014-03-28 NOTE — Progress Notes (Signed)
..  Paul Gonzalez presents today for injection per the provider's orders.  aranesp administration without incident; see MAR for injection details.  Patient tolerated procedure well and without incident.  No questions or complaints noted at this time.

## 2014-04-11 ENCOUNTER — Encounter (HOSPITAL_COMMUNITY): Payer: Managed Care, Other (non HMO) | Attending: Hematology & Oncology

## 2014-04-11 ENCOUNTER — Encounter (HOSPITAL_COMMUNITY): Payer: Managed Care, Other (non HMO)

## 2014-04-11 ENCOUNTER — Other Ambulatory Visit: Payer: Self-pay | Admitting: Family Medicine

## 2014-04-11 DIAGNOSIS — Z794 Long term (current) use of insulin: Secondary | ICD-10-CM | POA: Insufficient documentation

## 2014-04-11 DIAGNOSIS — D631 Anemia in chronic kidney disease: Secondary | ICD-10-CM

## 2014-04-11 DIAGNOSIS — N184 Chronic kidney disease, stage 4 (severe): Secondary | ICD-10-CM | POA: Diagnosis not present

## 2014-04-11 DIAGNOSIS — E119 Type 2 diabetes mellitus without complications: Secondary | ICD-10-CM | POA: Insufficient documentation

## 2014-04-11 DIAGNOSIS — I129 Hypertensive chronic kidney disease with stage 1 through stage 4 chronic kidney disease, or unspecified chronic kidney disease: Secondary | ICD-10-CM | POA: Insufficient documentation

## 2014-04-11 DIAGNOSIS — E785 Hyperlipidemia, unspecified: Secondary | ICD-10-CM | POA: Insufficient documentation

## 2014-04-11 LAB — CBC WITH DIFFERENTIAL/PLATELET
Basophils Absolute: 0.1 10*3/uL (ref 0.0–0.1)
Basophils Relative: 1 % (ref 0–1)
Eosinophils Absolute: 0.3 10*3/uL (ref 0.0–0.7)
Eosinophils Relative: 5 % (ref 0–5)
HCT: 25.2 % — ABNORMAL LOW (ref 39.0–52.0)
Hemoglobin: 8.6 g/dL — ABNORMAL LOW (ref 13.0–17.0)
LYMPHS ABS: 0.8 10*3/uL (ref 0.7–4.0)
Lymphocytes Relative: 13 % (ref 12–46)
MCH: 30.5 pg (ref 26.0–34.0)
MCHC: 34.1 g/dL (ref 30.0–36.0)
MCV: 89.4 fL (ref 78.0–100.0)
MONOS PCT: 9 % (ref 3–12)
Monocytes Absolute: 0.5 10*3/uL (ref 0.1–1.0)
NEUTROS PCT: 72 % (ref 43–77)
Neutro Abs: 4.2 10*3/uL (ref 1.7–7.7)
Platelets: 206 10*3/uL (ref 150–400)
RBC: 2.82 MIL/uL — ABNORMAL LOW (ref 4.22–5.81)
RDW: 12.7 % (ref 11.5–15.5)
WBC: 5.8 10*3/uL (ref 4.0–10.5)

## 2014-04-11 MED ORDER — DARBEPOETIN ALFA 40 MCG/0.4ML IJ SOSY
PREFILLED_SYRINGE | INTRAMUSCULAR | Status: AC
  Filled 2014-04-11: qty 0.4

## 2014-04-11 MED ORDER — DARBEPOETIN ALFA 40 MCG/0.4ML IJ SOSY
40.0000 ug | PREFILLED_SYRINGE | Freq: Once | INTRAMUSCULAR | Status: AC
  Administered 2014-04-11: 40 ug via SUBCUTANEOUS

## 2014-04-11 NOTE — Progress Notes (Signed)
Clare Charon presents today for injection per MD orders. [name] 40 mcg administered SQ in right Abdomen. Administration without incident. Patient tolerated well.

## 2014-04-11 NOTE — Patient Instructions (Signed)
[place] at [facility] Discharge Instructions  RECOMMENDATIONS MADE BY THE CONSULTANT AND ANY TEST RESULTS WILL BE SENT TO YOUR REFERRING PHYSICIAN.  Hemoglobin 8.6 today. [name] 40 mcg injection as ordered. Continue lab work and aranesp  Injection every 2 weeks. Return as scheduled.  Thank you for choosing [place] at [facility] to provide your oncology and hematology care.  To afford each patient quality time with our provider, please arrive at least 15 minutes before your scheduled appointment time.    You need to re-schedule your appointment should you arrive 10 or more minutes late.  We strive to give you quality time with our providers, and arriving late affects you and other patients whose appointments are after yours.  Also, if you no show three or more times for appointments you may be dismissed from the clinic at the providers discretion.     Again, thank you for choosing [facility].  Our hope is that these requests will decrease the amount of time that you wait before being seen by our physicians.       _____________________________________________________________  Should you have questions after your visit to [facility], please contact our office at [phone] between the hours of 8:30 a.m. and 4:30 p.m.  Voicemails left after 4:30 p.m. will not be returned until the following business day.  For prescription refill requests, have your pharmacy contact our office.

## 2014-04-12 ENCOUNTER — Encounter: Payer: Self-pay | Admitting: Surgery

## 2014-04-15 ENCOUNTER — Telehealth: Payer: Self-pay | Admitting: *Deleted

## 2014-04-15 ENCOUNTER — Encounter: Payer: Self-pay | Admitting: Surgery

## 2014-04-15 ENCOUNTER — Ambulatory Visit (HOSPITAL_COMMUNITY)
Admit: 2014-04-15 | Discharge: 2014-04-15 | Disposition: A | Discharge status: Home / Self Care | Payer: Managed Care, Other (non HMO) | Source: Ambulatory Visit | Attending: Surgery | Admitting: Surgery

## 2014-04-15 ENCOUNTER — Other Ambulatory Visit: Payer: Self-pay

## 2014-04-15 ENCOUNTER — Ambulatory Visit (INDEPENDENT_AMBULATORY_CARE_PROVIDER_SITE_OTHER): Payer: Self-pay | Admitting: Surgery

## 2014-04-15 VITALS — BP 137/57 | HR 65 | Ht 70.0 in | Wt 162.8 lb

## 2014-04-15 DIAGNOSIS — N186 End stage renal disease: Secondary | ICD-10-CM | POA: Diagnosis not present

## 2014-04-15 DIAGNOSIS — N185 Chronic kidney disease, stage 5: Secondary | ICD-10-CM

## 2014-04-15 DIAGNOSIS — Z4931 Encounter for adequacy testing for hemodialysis: Secondary | ICD-10-CM | POA: Diagnosis not present
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
## 2014-04-15 NOTE — Telephone Encounter (Signed)
No precert reqd

## 2014-04-16 ENCOUNTER — Other Ambulatory Visit: Payer: Managed Care, Other (non HMO)

## 2014-04-16 ENCOUNTER — Encounter (HOSPITAL_COMMUNITY)
Admission: RE | Disposition: A | Discharge status: Home / Self Care | Payer: Self-pay | Source: Ambulatory Visit | Attending: Surgery

## 2014-04-16 ENCOUNTER — Encounter (HOSPITAL_COMMUNITY): Payer: Self-pay | Admitting: Anesthesiology

## 2014-04-16 ENCOUNTER — Ambulatory Visit (HOSPITAL_COMMUNITY): Payer: PRIVATE HEALTH INSURANCE | Admitting: Anesthesiology

## 2014-04-16 ENCOUNTER — Ambulatory Visit (HOSPITAL_COMMUNITY)
Admission: RE | Admit: 2014-04-16 | Discharge: 2014-04-16 | Disposition: A | Discharge status: Home / Self Care | Payer: PRIVATE HEALTH INSURANCE | Source: Ambulatory Visit | Attending: Surgery | Admitting: Surgery

## 2014-04-16 DIAGNOSIS — Z7952 Long term (current) use of systemic steroids: Secondary | ICD-10-CM | POA: Diagnosis not present

## 2014-04-16 DIAGNOSIS — Z79899 Other long term (current) drug therapy: Secondary | ICD-10-CM | POA: Diagnosis not present

## 2014-04-16 DIAGNOSIS — D649 Anemia, unspecified: Secondary | ICD-10-CM | POA: Diagnosis not present

## 2014-04-16 DIAGNOSIS — Z794 Long term (current) use of insulin: Secondary | ICD-10-CM | POA: Insufficient documentation

## 2014-04-16 DIAGNOSIS — F329 Major depressive disorder, single episode, unspecified: Secondary | ICD-10-CM | POA: Diagnosis not present

## 2014-04-16 DIAGNOSIS — I120 Hypertensive chronic kidney disease with stage 5 chronic kidney disease or end stage renal disease: Secondary | ICD-10-CM | POA: Insufficient documentation

## 2014-04-16 DIAGNOSIS — I739 Peripheral vascular disease, unspecified: Secondary | ICD-10-CM | POA: Insufficient documentation

## 2014-04-16 DIAGNOSIS — N179 Acute kidney failure, unspecified: Secondary | ICD-10-CM | POA: Diagnosis present

## 2014-04-16 DIAGNOSIS — E119 Type 2 diabetes mellitus without complications: Secondary | ICD-10-CM | POA: Diagnosis not present

## 2014-04-16 DIAGNOSIS — N186 End stage renal disease: Secondary | ICD-10-CM

## 2014-04-16 DIAGNOSIS — Y929 Unspecified place or not applicable: Secondary | ICD-10-CM | POA: Diagnosis not present

## 2014-04-16 DIAGNOSIS — Y832 Surgical operation with anastomosis, bypass or graft as the cause of abnormal reaction of the patient, or of later complication, without mention of misadventure at the time of the procedure: Secondary | ICD-10-CM | POA: Diagnosis not present

## 2014-04-16 DIAGNOSIS — Z48812 Encounter for surgical aftercare following surgery on the circulatory system: Secondary | ICD-10-CM

## 2014-04-16 DIAGNOSIS — T82590A Other mechanical complication of surgically created arteriovenous fistula, initial encounter: Secondary | ICD-10-CM | POA: Diagnosis not present

## 2014-04-16 DIAGNOSIS — Z79891 Long term (current) use of opiate analgesic: Secondary | ICD-10-CM | POA: Insufficient documentation

## 2014-04-16 DIAGNOSIS — Z792 Long term (current) use of antibiotics: Secondary | ICD-10-CM | POA: Diagnosis not present

## 2014-04-16 DIAGNOSIS — T82898A Other specified complication of vascular prosthetic devices, implants and grafts, initial encounter: Secondary | ICD-10-CM | POA: Diagnosis not present

## 2014-04-16 HISTORY — PX: LIGATION OF ARTERIOVENOUS  FISTULA: SHX5948

## 2014-04-16 HISTORY — PX: SHUNTOGRAM: SHX5491

## 2014-04-16 LAB — POCT I-STAT, CHEM 8
BUN: 90 mg/dL — ABNORMAL HIGH (ref 6–23)
CHLORIDE: 111 mmol/L (ref 96–112)
Calcium, Ion: 1.19 mmol/L (ref 1.12–1.23)
Creatinine, Ser: 4.6 mg/dL — ABNORMAL HIGH (ref 0.50–1.35)
Glucose, Bld: 99 mg/dL (ref 70–99)
HEMATOCRIT: 26 % — AB (ref 39.0–52.0)
Hemoglobin: 8.8 g/dL — ABNORMAL LOW (ref 13.0–17.0)
POTASSIUM: 4.5 mmol/L (ref 3.5–5.1)
Sodium: 144 mmol/L (ref 135–145)
TCO2: 21 mmol/L (ref 0–100)

## 2014-04-16 LAB — GLUCOSE, CAPILLARY
GLUCOSE-CAPILLARY: 84 mg/dL (ref 70–99)
Glucose-Capillary: 87 mg/dL (ref 70–99)
Glucose-Capillary: 82 mg/dL (ref 70–99)

## 2014-04-16 SURGERY — ASSESSMENT, SHUNT FUNCTION, WITH CONTRAST RADIOGRAPHIC STUDY
Anesthesia: LOCAL

## 2014-04-16 SURGERY — LIGATION OF ARTERIOVENOUS  FISTULA
Anesthesia: General | Site: Arm Lower | Laterality: Left

## 2014-04-16 MED ORDER — OXYCODONE-ACETAMINOPHEN 5-325 MG PO TABS: 1.0000 | ORAL_TABLET | ORAL | Status: DC | PRN

## 2014-04-16 MED ORDER — HEPARIN SODIUM (PORCINE) 1000 UNIT/ML IJ SOLN
INTRAMUSCULAR | Status: AC
  Filled 2014-04-16: qty 1

## 2014-04-16 MED ORDER — FENTANYL CITRATE 0.05 MG/ML IJ SOLN
INTRAMUSCULAR | Status: AC
  Filled 2014-04-16: qty 5

## 2014-04-16 MED ORDER — HEPARIN (PORCINE) IN NACL 2-0.9 UNIT/ML-% IJ SOLN
INTRAMUSCULAR | Status: AC
  Filled 2014-04-16: qty 500

## 2014-04-16 MED ORDER — LIDOCAINE HCL (CARDIAC) 20 MG/ML IV SOLN
INTRAVENOUS | Status: AC
  Filled 2014-04-16: qty 5

## 2014-04-16 MED ORDER — SODIUM CHLORIDE 0.9 % IV SOLN
INTRAVENOUS | Status: DC
  Administered 2014-04-16: 13:00:00 via INTRAVENOUS

## 2014-04-16 MED ORDER — LIDOCAINE-EPINEPHRINE (PF) 1 %-1:200000 IJ SOLN
INTRAMUSCULAR | Status: DC | PRN
  Administered 2014-04-16: 2 mL via INTRADERMAL

## 2014-04-16 MED ORDER — LIDOCAINE HCL (PF) 1 % IJ SOLN
INTRAMUSCULAR | Status: AC
  Filled 2014-04-16: qty 30

## 2014-04-16 MED ORDER — PROTAMINE SULFATE 10 MG/ML IV SOLN
INTRAVENOUS | Status: AC
  Filled 2014-04-16: qty 5

## 2014-04-16 MED ORDER — FENTANYL CITRATE 0.05 MG/ML IJ SOLN
INTRAMUSCULAR | Status: DC | PRN
Start: 2014-04-16 — End: 2014-04-16
  Administered 2014-04-16: 100 ug via INTRAVENOUS

## 2014-04-16 MED ORDER — LIDOCAINE HCL (CARDIAC) 20 MG/ML IV SOLN
INTRAVENOUS | Status: DC | PRN
  Administered 2014-04-16: 50 mg via INTRAVENOUS

## 2014-04-16 MED ORDER — PROPOFOL 10 MG/ML IV BOLUS
INTRAVENOUS | Status: AC
  Filled 2014-04-16: qty 20

## 2014-04-16 MED ORDER — PROPOFOL 10 MG/ML IV BOLUS
INTRAVENOUS | Status: DC | PRN
  Administered 2014-04-16 (×2): 20 mg via INTRAVENOUS

## 2014-04-16 MED ORDER — 0.9 % SODIUM CHLORIDE (POUR BTL) OPTIME
TOPICAL | Status: DC | PRN
  Administered 2014-04-16: 1000 mL

## 2014-04-16 SURGICAL SUPPLY — 27 items
CANISTER SUCTION 2500CC (MISCELLANEOUS) ×3 IMPLANT
ELECT REM PT RETURN 9FT ADLT (ELECTROSURGICAL) ×3
ELECTRODE REM PT RTRN 9FT ADLT (ELECTROSURGICAL) ×1 IMPLANT
GAUZE SPONGE 4X4 12PLY STRL (GAUZE/BANDAGES/DRESSINGS) ×3 IMPLANT
GEL ULTRASOUND 20GR AQUASONIC (MISCELLANEOUS) IMPLANT
GLOVE BIO SURGEON STRL SZ7.5 (GLOVE) ×3 IMPLANT
GLOVE BIOGEL PI IND STRL 8 (GLOVE) ×1 IMPLANT
GLOVE BIOGEL PI INDICATOR 8 (GLOVE) ×2
GOWN STRL REUS W/ TWL LRG LVL3 (GOWN DISPOSABLE) ×2 IMPLANT
GOWN STRL REUS W/TWL LRG LVL3 (GOWN DISPOSABLE) ×4
KIT BASIN OR (CUSTOM PROCEDURE TRAY) ×3 IMPLANT
KIT ROOM TURNOVER OR (KITS) ×3 IMPLANT
LIQUID BAND (GAUZE/BANDAGES/DRESSINGS) ×3 IMPLANT
NS IRRIG 1000ML POUR BTL (IV SOLUTION) ×3 IMPLANT
PACK CV ACCESS (CUSTOM PROCEDURE TRAY) ×3 IMPLANT
PAD ARMBOARD 7.5X6 YLW CONV (MISCELLANEOUS) ×6 IMPLANT
SPONGE SURGIFOAM ABS GEL 100 (HEMOSTASIS) IMPLANT
SUT ETHILON 3 0 PS 1 (SUTURE) IMPLANT
SUT PROLENE 6 0 BV (SUTURE) ×3 IMPLANT
SUT SILK 0 TIES 10X30 (SUTURE) ×3 IMPLANT
SUT VIC AB 3-0 SH 27 (SUTURE) ×2
SUT VIC AB 3-0 SH 27X BRD (SUTURE) ×1 IMPLANT
SUT VICRYL 4-0 PS2 18IN ABS (SUTURE) ×3 IMPLANT
SWAB COLLECTION DEVICE MRSA (MISCELLANEOUS) IMPLANT
TUBE ANAEROBIC SPECIMEN COL (MISCELLANEOUS) IMPLANT
UNDERPAD 30X30 INCONTINENT (UNDERPADS AND DIAPERS) ×3 IMPLANT
WATER STERILE IRR 1000ML POUR (IV SOLUTION) IMPLANT

## 2014-04-16 NOTE — Anesthesia Postprocedure Evaluation (Signed)
  Anesthesia Post-op Note  Patient: Paul Gonzalez  Procedure(s) Performed: Procedure(s): BRANCH LIGATION LEFT ARM FISTULA (Left)  Patient Location: PACU  Anesthesia Type:MAC  Level of Consciousness: awake, alert , oriented and patient cooperative  Airway and Oxygen Therapy: Patient Spontanous Breathing  Post-op Pain: mild  Post-op Assessment: Post-op Vital signs reviewed, Patient's Cardiovascular Status Stable, Respiratory Function Stable, Patent Airway, No signs of Nausea or vomiting and Pain level controlled  Post-op Vital Signs: stable  Last Vitals:  Filed Vitals:   04/16/14 1419  BP: 180/66  Pulse: 75  Temp: 36.7 C  Resp: 14    Complications: No apparent anesthesia complications

## 2014-04-16 NOTE — Progress Notes (Signed)
Anesthesia here. Patient going to OR.

## 2014-04-16 NOTE — Interval H&P Note (Signed)
History and Physical Interval Note:  04/16/2014 12:43 PM  Paul Gonzalez  has presented today for surgery, with the diagnosis of ESRD  The various methods of treatment have been discussed with the patient and family. After consideration of risks, benefits and other options for treatment, the patient has consented to  Procedure(s): BRANCH LIGATION LEFT ARM FISTULA (Left) as a surgical intervention .  The patient's history has been reviewed, patient examined, no change in status, stable for surgery.  I have reviewed the patient's chart and labs.  Questions were answered to the patient's satisfaction.     Lamekia Nolden S

## 2014-04-16 NOTE — Progress Notes (Signed)
Pt waiting in [facility] for OR for ligation of vessel that is stealing flow from the lt AFV.  Pt resting comfortably

## 2014-04-16 NOTE — Op Note (Signed)
    NAME: Paul Gonzalez   MRN: [id number] DOB: Jan 22, 1965    DATE OF OPERATION: 04/16/2014  PREOP DIAGNOSIS: competing branch of left radial cephalic AV fistula  POSTOP DIAGNOSIS: same  PROCEDURE: ligation of competing branch of left radial cephalic AV fistula  SURGEON: Judeth Cornfield. Scot Dock, MD, FACS  ASSIST: none  ANESTHESIA: local with sedation   EBL: minimal  INDICATIONS: Paul Gonzalez is a 49 y.o. male underwent a fistulogram today which showed no significant problems with the fistula except for a competing branch in the mid forearm. He presents for ligation.  FINDINGS: Competing branch successfully identified and ligated.  TECHNIQUE: The patient was taken to the operating room and sedated by anesthesia. Left upper extremity was prepped and draped in the usual sterile fashion. The fistula was identified with the duplex scanner. After the skin was anesthetized. Lidocaine, a small transverse incision was made over the branch. The branch was a defined and ligated with a 2-0 silk tie. There was an excellent thrill in the fistula. The incision was closed with a 4-0 Vicryl. Dermabond was applied. The patient tolerated the procedure well and transferred to the recovery room in stable condition. All needle and sponge counts were correct.  Deitra Mayo, MD, FACS [facility]  DATE OF DICTATION:   04/16/2014

## 2014-04-16 NOTE — Anesthesia Procedure Notes (Signed)
Date/Time: 04/16/2014 1:00 PM Performed by: Eligha Bridegroom Patient Re-evaluated:Patient Re-evaluated prior to inductionOxygen Delivery Method: Nasal cannula Preoxygenation: Pre-oxygenation with 100% oxygen

## 2014-04-16 NOTE — Transfer of Care (Signed)
Immediate Anesthesia Transfer of Care Note  Patient: Paul Gonzalez  Procedure(s) Performed: Procedure(s): BRANCH LIGATION LEFT ARM FISTULA (Left)  Patient Location: PACU  Anesthesia Type:MAC  Level of Consciousness: awake, alert  and oriented  Airway & Oxygen Therapy: Patient Spontanous Breathing and Patient connected to nasal cannula oxygen  Post-op Assessment: Report given to RN and Post -op Vital signs reviewed and stable  Post vital signs: Reviewed and stable  Last Vitals:  Filed Vitals:   04/16/14 1305  BP: 205/78  Pulse: 79  Temp:   Resp: 22    Complications: No apparent anesthesia complications

## 2014-04-16 NOTE — Interval H&P Note (Signed)
History and Physical Interval Note:  04/16/2014 9:28 AM  Paul Gonzalez  has presented today for surgery, with the diagnosis of PVD  The various methods of treatment have been discussed with the patient and family. After consideration of risks, benefits and other options for treatment, the patient has consented to  Procedure(s): SHUNTOGRAM (N/A) as a surgical intervention .  The patient's history has been reviewed, patient examined, no change in status, stable for surgery.  I have reviewed the patient's chart and labs.  Questions were answered to the patient's satisfaction.     BRABHAM IV, V. WELLS

## 2014-04-16 NOTE — Op Note (Signed)
    Patient name: Issacc Talk MRN: [id number] DOB: 1965/03/25 Sex: male  04/16/2014 Pre-operative Diagnosis: Chronic renal insufficiency Post-operative diagnosis:  Same Surgeon:  Eldridge Abrahams Procedure Performed:  1.  Ultrasound-guided access, left cephalic vein  2.  Fistulogram   Indications:  The patient is not yet on dialysis.  Fistula ultrasound revealed possible anastomotic stenosis.  He is here for further evaluation  Procedure:  The patient was identified in the holding area and taken to room 8.  The patient was then placed supine on the table and prepped and draped in the usual sterile fashion.  A time out was called.  Ultrasound was used to evaluate the fistula.  The vein was patent and compressible.  A digital ultrasound image was acquired.  The fistula was then accessed under ultrasound guidance using a micropuncture needle.  An 018 wire was then asvanced without resistance and a micropuncture sheath was placed.  Contrast injections were then performed through the sheath.  Findings:  A total of 10 cc of contrast was utilized.  The arterial venous anastomosis is widely patent.  The fistula appears widely patent in the forearm.  There is one branch   Intervention:  None  Impression:  #1  widely patent fistula within the forearm.  #2  no arterial venous anastomotic stenosis identified  #3  single branch in the forearm  V. Annamarie Major, M.D. [facility] [place] Office: [phone] Pager:  [phone]

## 2014-04-16 NOTE — Anesthesia Preprocedure Evaluation (Addendum)
Anesthesia Evaluation  Patient identified by MRN, date of birth, ID band Patient awake    Reviewed: Allergy & Precautions, NPO status , Patient's Chart, lab work & pertinent test results  Airway Mallampati: I  TM Distance: >3 FB     Dental   Pulmonary    Pulmonary exam normal       Cardiovascular hypertension, + Peripheral Vascular Disease Rhythm:Regular Rate:Normal     Neuro/Psych Depression    GI/Hepatic   Endo/Other  diabetes, Type 2, Insulin Dependent, Oral Hypoglycemic Agents  Renal/GU ARF and Renal InsufficiencyRenal disease     Musculoskeletal   Abdominal   Peds  Hematology  (+) anemia ,   Anesthesia Other Findings   Reproductive/Obstetrics                            Anesthesia Physical Anesthesia Plan  ASA: III  Anesthesia Plan: General   Post-op Pain Management:    Induction: Intravenous  Airway Management Planned: Oral ETT and LMA  Additional Equipment:   Intra-op Plan:   Post-operative Plan: Extubation in OR  Informed Consent: I have reviewed the patients History and Physical, chart, labs and discussed the procedure including the risks, benefits and alternatives for the proposed anesthesia with the patient or authorized representative who has indicated his/her understanding and acceptance.     Plan Discussed with: CRNA, Anesthesiologist and Surgeon  Anesthesia Plan Comments:         Anesthesia Quick Evaluation
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
## 2014-04-17 ENCOUNTER — Telehealth: Payer: Self-pay | Admitting: Surgery

## 2014-04-17 NOTE — Telephone Encounter (Signed)
-----   Message from Denman George, RN sent at 04/16/2014  5:28 PM EDT ----- Regarding: Zigmund Daniel log; also needs 3-4 wk f/u with duplex of left arm AVF and OV w/ VWB   ----- Message -----    From: Angelia Mould, MD    Sent: 04/16/2014   2:14 PM      To: [facility] Charge Pool Subject: charge and f/u                                 PROCEDURE: ligation of competing branch of left radial cephalic AV fistula  SURGEON: Judeth Cornfield. Scot Dock, MD, FACS  ASSIST: none  He will need a follow up visit in 3-4 weeks with Dr. Trula Slade with a duplex of the fistula at that time. Thank you. CD

## 2014-04-17 NOTE — Telephone Encounter (Signed)
LM for pt to call, dpm

## 2014-04-18 ENCOUNTER — Telehealth (HOSPITAL_COMMUNITY): Payer: Self-pay | Admitting: Hematology & Oncology

## 2014-04-18 ENCOUNTER — Encounter (HOSPITAL_COMMUNITY): Payer: Self-pay | Admitting: Vascular Surgery

## 2014-04-18 NOTE — Telephone Encounter (Signed)
[place] # [phone]

## 2014-04-24 ENCOUNTER — Encounter (HOSPITAL_COMMUNITY): Payer: Self-pay | Admitting: Hematology & Oncology

## 2014-04-24 ENCOUNTER — Encounter (HOSPITAL_BASED_OUTPATIENT_CLINIC_OR_DEPARTMENT_OTHER): Payer: Managed Care, Other (non HMO) | Admitting: Hematology & Oncology

## 2014-04-24 ENCOUNTER — Encounter (HOSPITAL_COMMUNITY): Payer: Managed Care, Other (non HMO) | Attending: Hematology & Oncology

## 2014-04-24 ENCOUNTER — Ambulatory Visit (INDEPENDENT_AMBULATORY_CARE_PROVIDER_SITE_OTHER): Payer: PRIVATE HEALTH INSURANCE | Admitting: *Deleted

## 2014-04-24 ENCOUNTER — Ambulatory Visit (HOSPITAL_COMMUNITY): Payer: Managed Care, Other (non HMO) | Admitting: Hematology & Oncology

## 2014-04-24 ENCOUNTER — Encounter (HOSPITAL_BASED_OUTPATIENT_CLINIC_OR_DEPARTMENT_OTHER): Payer: Managed Care, Other (non HMO)

## 2014-04-24 VITALS — BP 165/70 | HR 61 | Temp 97.9°F | Resp 16 | Wt 161.6 lb

## 2014-04-24 DIAGNOSIS — I10 Essential (primary) hypertension: Secondary | ICD-10-CM

## 2014-04-24 DIAGNOSIS — E119 Type 2 diabetes mellitus without complications: Secondary | ICD-10-CM

## 2014-04-24 DIAGNOSIS — N184 Chronic kidney disease, stage 4 (severe): Secondary | ICD-10-CM

## 2014-04-24 DIAGNOSIS — D631 Anemia in chronic kidney disease: Secondary | ICD-10-CM | POA: Diagnosis not present

## 2014-04-24 DIAGNOSIS — Z111 Encounter for screening for respiratory tuberculosis: Secondary | ICD-10-CM | POA: Diagnosis not present

## 2014-04-24 LAB — CBC WITH DIFFERENTIAL/PLATELET
Basophils Absolute: 0.1 10*3/uL (ref 0.0–0.1)
Basophils Relative: 1 % (ref 0–1)
EOS ABS: 0.3 10*3/uL (ref 0.0–0.7)
Eosinophils Relative: 5 % (ref 0–5)
HEMATOCRIT: 26.7 % — AB (ref 39.0–52.0)
Hemoglobin: 9 g/dL — ABNORMAL LOW (ref 13.0–17.0)
Lymphocytes Relative: 15 % (ref 12–46)
Lymphs Abs: 0.9 10*3/uL (ref 0.7–4.0)
MCH: 30.7 pg (ref 26.0–34.0)
MCHC: 33.7 g/dL (ref 30.0–36.0)
MCV: 91.1 fL (ref 78.0–100.0)
MONO ABS: 0.4 10*3/uL (ref 0.1–1.0)
MONOS PCT: 6 % (ref 3–12)
NEUTROS ABS: 4.4 10*3/uL (ref 1.7–7.7)
Neutrophils Relative %: 73 % (ref 43–77)
Platelets: 186 10*3/uL (ref 150–400)
RBC: 2.93 MIL/uL — ABNORMAL LOW (ref 4.22–5.81)
RDW: 13.5 % (ref 11.5–15.5)
WBC: 6.1 10*3/uL (ref 4.0–10.5)

## 2014-04-24 MED ORDER — DARBEPOETIN ALFA 40 MCG/0.4ML IJ SOSY
40.0000 ug | PREFILLED_SYRINGE | Freq: Once | INTRAMUSCULAR | Status: AC
  Administered 2014-04-24: 40 ug via SUBCUTANEOUS
  Filled 2014-04-24: qty 0.4

## 2014-04-24 NOTE — Progress Notes (Signed)
Please see doctors encounter for more information 

## 2014-04-24 NOTE — Progress Notes (Signed)
Paul Gonzalez presents today for injection per MD orders. [name] 40 mcg administered SQ in right Abdomen. Administration without incident. Patient tolerated well.

## 2014-04-24 NOTE — Patient Instructions (Signed)
[place] at [facility] Discharge Instructions  RECOMMENDATIONS MADE BY THE CONSULTANT AND ANY TEST RESULTS WILL BE SENT TO YOUR REFERRING PHYSICIAN.  Exam and discussion by Dr. Whitney Muse. Will give you [name] today and will see you back as needed.  Thank you for choosing [place] at [facility] to provide your oncology and hematology care.  To afford each patient quality time with our provider, please arrive at least 15 minutes before your scheduled appointment time.    You need to re-schedule your appointment should you arrive 10 or more minutes late.  We strive to give you quality time with our providers, and arriving late affects you and other patients whose appointments are after yours.  Also, if you no show three or more times for appointments you may be dismissed from the clinic at the providers discretion.     Again, thank you for choosing [facility].  Our hope is that these requests will decrease the amount of time that you wait before being seen by our physicians.       _____________________________________________________________  Should you have questions after your visit to [facility], please contact our office at [phone] between the hours of 8:30 a.m. and 4:30 p.m.  Voicemails left after 4:30 p.m. will not be returned until the following business day.  For prescription refill requests, have your pharmacy contact our office.
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
## 2014-04-25 NOTE — Progress Notes (Signed)
Labs drawn

## 2014-04-26 LAB — TB SKIN TEST
Induration: 0 mm
TB Skin Test: NEGATIVE

## 2014-05-13 ENCOUNTER — Encounter: Payer: Managed Care, Other (non HMO) | Admitting: Surgery

## 2014-05-15 ENCOUNTER — Encounter (HOSPITAL_COMMUNITY): Payer: Managed Care, Other (non HMO)

## 2014-05-17 ENCOUNTER — Encounter: Payer: Self-pay | Admitting: Surgery

## 2014-05-20 ENCOUNTER — Encounter: Payer: Self-pay | Admitting: Surgery

## 2014-05-20 ENCOUNTER — Ambulatory Visit (HOSPITAL_COMMUNITY)
Admission: RE | Admit: 2014-05-20 | Discharge: 2014-05-20 | Disposition: A | Discharge status: Home / Self Care | Payer: Managed Care, Other (non HMO) | Source: Ambulatory Visit | Attending: Surgery | Admitting: Surgery

## 2014-05-20 ENCOUNTER — Ambulatory Visit (INDEPENDENT_AMBULATORY_CARE_PROVIDER_SITE_OTHER): Payer: Self-pay | Admitting: Surgery

## 2014-05-20 VITALS — BP 132/60 | HR 66 | Ht 70.0 in | Wt 171.0 lb

## 2014-05-20 DIAGNOSIS — Z48812 Encounter for surgical aftercare following surgery on the circulatory system: Secondary | ICD-10-CM | POA: Diagnosis not present

## 2014-05-20 DIAGNOSIS — N186 End stage renal disease: Secondary | ICD-10-CM

## 2014-05-20 DIAGNOSIS — N185 Chronic kidney disease, stage 5: Secondary | ICD-10-CM
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
## 2014-05-27 DIAGNOSIS — Z0279 Encounter for issue of other medical certificate: Secondary | ICD-10-CM | POA: Diagnosis not present

## 2014-06-23 DIAGNOSIS — N189 Chronic kidney disease, unspecified: Secondary | ICD-10-CM | POA: Insufficient documentation

## 2014-07-01 DIAGNOSIS — Z029 Encounter for administrative examinations, unspecified: Secondary | ICD-10-CM

## 2014-07-22 ENCOUNTER — Ambulatory Visit: Payer: PRIVATE HEALTH INSURANCE | Admitting: Family Medicine

## 2014-08-27 ENCOUNTER — Other Ambulatory Visit: Payer: Self-pay | Admitting: Family Medicine

## 2014-09-18 ENCOUNTER — Other Ambulatory Visit: Payer: Self-pay

## 2014-09-23 ENCOUNTER — Encounter (HOSPITAL_COMMUNITY)
Admission: RE | Disposition: A | Discharge status: Home / Self Care | Payer: Self-pay | Source: Ambulatory Visit | Attending: Vascular Surgery

## 2014-09-23 ENCOUNTER — Ambulatory Visit (HOSPITAL_COMMUNITY)
Admission: RE | Admit: 2014-09-23 | Discharge: 2014-09-23 | Disposition: A | Discharge status: Home / Self Care | Payer: PRIVATE HEALTH INSURANCE | Source: Ambulatory Visit | Attending: Vascular Surgery | Admitting: Vascular Surgery

## 2014-09-23 DIAGNOSIS — T82510A Breakdown (mechanical) of surgically created arteriovenous fistula, initial encounter: Secondary | ICD-10-CM | POA: Insufficient documentation

## 2014-09-23 DIAGNOSIS — I120 Hypertensive chronic kidney disease with stage 5 chronic kidney disease or end stage renal disease: Secondary | ICD-10-CM | POA: Diagnosis not present

## 2014-09-23 DIAGNOSIS — E1122 Type 2 diabetes mellitus with diabetic chronic kidney disease: Secondary | ICD-10-CM | POA: Insufficient documentation

## 2014-09-23 DIAGNOSIS — T82898A Other specified complication of vascular prosthetic devices, implants and grafts, initial encounter: Secondary | ICD-10-CM | POA: Diagnosis not present

## 2014-09-23 DIAGNOSIS — N179 Acute kidney failure, unspecified: Secondary | ICD-10-CM | POA: Diagnosis present

## 2014-09-23 DIAGNOSIS — Z992 Dependence on renal dialysis: Secondary | ICD-10-CM | POA: Diagnosis not present

## 2014-09-23 DIAGNOSIS — N186 End stage renal disease: Secondary | ICD-10-CM | POA: Insufficient documentation

## 2014-09-23 DIAGNOSIS — E785 Hyperlipidemia, unspecified: Secondary | ICD-10-CM | POA: Insufficient documentation

## 2014-09-23 DIAGNOSIS — Z794 Long term (current) use of insulin: Secondary | ICD-10-CM | POA: Diagnosis not present

## 2014-09-23 HISTORY — PX: PERIPHERAL VASCULAR CATHETERIZATION: SHX172C

## 2014-09-23 LAB — GLUCOSE, CAPILLARY
Glucose-Capillary: 70 mg/dL (ref 65–99)
Glucose-Capillary: 67 mg/dL (ref 65–99)

## 2014-09-23 LAB — POCT I-STAT, CHEM 8
BUN: 56 mg/dL — AB (ref 6–20)
CALCIUM ION: 1.13 mmol/L (ref 1.12–1.23)
CREATININE: 5.8 mg/dL — AB (ref 0.61–1.24)
Chloride: 103 mmol/L (ref 101–111)
Glucose, Bld: 71 mg/dL (ref 65–99)
HEMATOCRIT: 37 % — AB (ref 39.0–52.0)
Hemoglobin: 12.6 g/dL — ABNORMAL LOW (ref 13.0–17.0)
Potassium: 4.3 mmol/L (ref 3.5–5.1)
SODIUM: 141 mmol/L (ref 135–145)
TCO2: 25 mmol/L (ref 0–100)

## 2014-09-23 SURGERY — A/V SHUNTOGRAM/FISTULAGRAM

## 2014-09-23 MED ORDER — HEPARIN (PORCINE) IN NACL 2-0.9 UNIT/ML-% IJ SOLN
INTRAMUSCULAR | Status: AC
  Filled 2014-09-23: qty 500

## 2014-09-23 MED ORDER — DEXTROSE 50 % IV SOLN: 25.0000 mL | Freq: Once | INTRAVENOUS | Status: DC

## 2014-09-23 MED ORDER — SODIUM CHLORIDE 0.9 % IJ SOLN
3.0000 mL | Freq: Two times a day (BID) | INTRAMUSCULAR | Status: DC
Start: 2014-09-23 — End: 2014-09-23

## 2014-09-23 MED ORDER — SODIUM CHLORIDE 0.9 % IV SOLN: 250.0000 mL | INTRAVENOUS | Status: DC | PRN

## 2014-09-23 MED ORDER — HYDRALAZINE HCL 20 MG/ML IJ SOLN
INTRAMUSCULAR | Status: AC
  Filled 2014-09-23: qty 1

## 2014-09-23 MED ORDER — HYDRALAZINE HCL 20 MG/ML IJ SOLN
INTRAMUSCULAR | Status: DC | PRN
  Administered 2014-09-23 (×2): 10 mg via INTRAVENOUS

## 2014-09-23 MED ORDER — LIDOCAINE HCL (PF) 1 % IJ SOLN
INTRAMUSCULAR | Status: AC
  Filled 2014-09-23: qty 30

## 2014-09-23 MED ORDER — HYDRALAZINE HCL 20 MG/ML IJ SOLN: 10.0000 mg | INTRAMUSCULAR | Status: DC | PRN

## 2014-09-23 MED ORDER — ACETAMINOPHEN 325 MG PO TABS: 650.0000 mg | ORAL_TABLET | ORAL | Status: DC | PRN

## 2014-09-23 MED ORDER — SODIUM CHLORIDE 0.9 % IJ SOLN: 3.0000 mL | INTRAMUSCULAR | Status: DC | PRN

## 2014-09-23 SURGICAL SUPPLY — 10 items
BAG SNAP BAND KOVER 36X36 (MISCELLANEOUS) ×3 IMPLANT
COVER DOME SNAP 22 D (MISCELLANEOUS) ×6 IMPLANT
COVER PRB 48X5XTLSCP FOLD TPE (BAG) ×2 IMPLANT
COVER PROBE 5X48 (BAG) ×4
KIT MICROINTRODUCER STIFF 5F (SHEATH) ×3 IMPLANT
PROTECTION STATION PRESSURIZED (MISCELLANEOUS) ×3
STATION PROTECTION PRESSURIZED (MISCELLANEOUS) ×1 IMPLANT
STOPCOCK MORSE 400PSI 3WAY (MISCELLANEOUS) ×3 IMPLANT
TRAY PV CATH (CUSTOM PROCEDURE TRAY) ×3 IMPLANT
TUBING CIL FLEX 10 FLL-RA (TUBING) ×3 IMPLANT
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
## 2014-09-23 NOTE — Discharge Instructions (Signed)
Fistulogram, Care After °Refer to this sheet in the next few weeks. These instructions provide you with information on caring for yourself after your procedure. Your health care provider may also give you more specific instructions. Your treatment has been planned according to current medical practices, but problems sometimes occur. Call your health care provider if you have any problems or questions after your procedure. °WHAT TO EXPECT AFTER THE PROCEDURE °After your procedure, it is typical to have the following: °· A small amount of discomfort in the area where the catheters were placed. °· A small amount of bruising around the fistula. °· Sleepiness and fatigue. °HOME CARE INSTRUCTIONS °· Rest at home for the day following your procedure. °· Do not drive or operate heavy machinery while taking pain medicine. °· Take medicines only as directed by your health care provider. °· Do not take baths, swim, or use a hot tub until your health care provider approves. You may shower 24 hours after the procedure or as directed by your health care provider. °· There are many different ways to close and cover an incision, including stitches, skin glue, and adhesive strips. Follow your health care provider's instructions on: °¨ Incision care. °¨ Bandage (dressing) changes and removal. °¨ Incision closure removal. °· Monitor your dialysis fistula carefully. °SEEK MEDICAL CARE IF: °· You have drainage, redness, swelling, or pain at your catheter site. °· You have a fever. °· You have chills. °SEEK IMMEDIATE MEDICAL CARE IF: °· You feel weak. °· You have trouble balancing. °· You have trouble moving your arms or legs. °· You have problems with your speech or vision. °· You can no longer feel a vibration or buzz when you put your fingers over your dialysis fistula. °· The limb that was used for the procedure: °¨ Swells. °¨ Is painful. °¨ Is cold. °¨ Is discolored, such as blue or pale white. °Document Released: 05/07/2013  Document Reviewed: 02/09/2013 °ExitCare® Patient Information ©2015 [employer]. This information is not intended to replace advice given to you by your health care provider. Make sure you discuss any questions you have with your health care provider. ° °

## 2014-09-24 ENCOUNTER — Encounter (HOSPITAL_COMMUNITY): Payer: Self-pay | Admitting: Vascular Surgery

## 2014-09-24 MED ORDER — LIDOCAINE HCL (PF) 1 % IJ SOLN
INTRAMUSCULAR | Status: DC | PRN
  Administered 2014-09-23: 16:00:00

## 2014-10-15 LAB — HEMOGLOBIN A1C: HEMOGLOBIN A1C: 5.2 % (ref 4.0–6.0)

## 2014-11-04 ENCOUNTER — Other Ambulatory Visit: Payer: Self-pay | Admitting: *Deleted

## 2014-11-04 ENCOUNTER — Telehealth: Payer: Self-pay | Admitting: Family Medicine

## 2014-11-04 MED ORDER — CITALOPRAM HYDROBROMIDE 20 MG PO TABS: 20.0000 mg | ORAL_TABLET | Freq: Every day | ORAL | Status: DC

## 2014-11-04 NOTE — Telephone Encounter (Signed)
30 day supply sent to pharm. Pt needs ov. Transferred to front to schedule ov.

## 2014-11-04 NOTE — Telephone Encounter (Signed)
Pt is needing his celexa sent into [facility] [place] pt took his last one today

## 2014-11-05 ENCOUNTER — Other Ambulatory Visit: Payer: Self-pay | Admitting: *Deleted

## 2014-11-05 MED ORDER — INSULIN NPH (HUMAN) (ISOPHANE) 100 UNIT/ML ~~LOC~~ SUSP: SUBCUTANEOUS | Status: DC

## 2014-11-18 LAB — HM DIABETES EYE EXAM

## 2014-11-19 DIAGNOSIS — M9080 Osteopathy in diseases classified elsewhere, unspecified site: Secondary | ICD-10-CM | POA: Insufficient documentation

## 2014-11-19 DIAGNOSIS — E889 Metabolic disorder, unspecified: Secondary | ICD-10-CM | POA: Insufficient documentation

## 2014-11-22 ENCOUNTER — Ambulatory Visit (INDEPENDENT_AMBULATORY_CARE_PROVIDER_SITE_OTHER): Payer: PRIVATE HEALTH INSURANCE | Admitting: Family Medicine

## 2014-11-22 ENCOUNTER — Encounter: Payer: Self-pay | Admitting: Family Medicine

## 2014-11-22 ENCOUNTER — Other Ambulatory Visit: Payer: Self-pay | Admitting: *Deleted

## 2014-11-22 VITALS — BP 124/76 | Ht 70.0 in | Wt 167.0 lb

## 2014-11-22 DIAGNOSIS — I10 Essential (primary) hypertension: Secondary | ICD-10-CM

## 2014-11-22 DIAGNOSIS — Z794 Long term (current) use of insulin: Secondary | ICD-10-CM

## 2014-11-22 DIAGNOSIS — E1140 Type 2 diabetes mellitus with diabetic neuropathy, unspecified: Secondary | ICD-10-CM | POA: Diagnosis not present

## 2014-11-22 DIAGNOSIS — E1121 Type 2 diabetes mellitus with diabetic nephropathy: Secondary | ICD-10-CM | POA: Diagnosis not present

## 2014-11-22 MED ORDER — CITALOPRAM HYDROBROMIDE 20 MG PO TABS: 20.0000 mg | ORAL_TABLET | Freq: Every day | ORAL | Status: DC

## 2014-11-22 MED ORDER — INSULIN NPH (HUMAN) (ISOPHANE) 100 UNIT/ML ~~LOC~~ SUSP: SUBCUTANEOUS | Status: DC

## 2014-11-22 MED ORDER — LORATADINE 10 MG PO TABS: 10.0000 mg | ORAL_TABLET | Freq: Every day | ORAL | Status: DC

## 2014-11-22 NOTE — Progress Notes (Signed)
   Subjective:    Patient ID: Paul Gonzalez, male    DOB: 1965/08/14, 48 y.o.   MRN: [id number]  Diabetes He presents for his follow-up diabetic visit. He has type 2 diabetes mellitus. Current diabetic treatment includes insulin injections. He is compliant with treatment all of the time. He is following a diabetic diet. He participates in exercise daily. His breakfast blood glucose range is generally 140-180 mg/dl. He does not see a podiatrist.Eye exam is current.  A1C on bw on oct 11. 5.2  Compliant with diet. No low sugar spells. Patient's spouse helps with insulin administration.  Patient currently on dialysis. Handling it reasonably well.  Patient reports depression has improved considerably on Celexa. Medications reviewed today. No obvious side effects. Once to stay on it.   Patient requests refill for allergies. Medications reviewed today. Compliant with medications. No obvious side effects.  Does have complications of diabetes including retinopathy. Sees an eye doctor on a regular basis.   No concerns or problems. Needs refill on celexa, insulin and loratadine.  Morn sugars good control  utd on vaccines, got a flu shot and a pneum shot   Pt has allergies worse int the winter and spring     Exercise regular,   celexa definitely helping ,    Review of Systems No headache no chest pain no back pain no abdominal pain ROS otherwise negative    Objective:   Physical Exam  Alert vitals stable HEENT normal mild nasal congestion lungs clear heart regular in rhythm ankles without edema      Assessment & Plan:  Impression 1 depression clinically stable compliant with medications meds reviewed to maintain same #2 type 2 diabetes excellent A1c no low sugar spells will maintain same dose because of this #3 dialysis discussed #4 diabetic retinopathy discussed #5 chronic allergies ongoing meds reviewed will continue same plan all medications refilled. Diet exercise discussed.  Vaccines given elsewhere. Follow-up in 6 months. WSL

## 2015-02-20 ENCOUNTER — Other Ambulatory Visit: Payer: Self-pay | Admitting: Family Medicine

## 2015-02-27 ENCOUNTER — Other Ambulatory Visit: Payer: Self-pay | Admitting: Family Medicine

## 2015-05-21 ENCOUNTER — Ambulatory Visit: Payer: Self-pay | Admitting: Family Medicine

## 2015-05-22 ENCOUNTER — Ambulatory Visit (INDEPENDENT_AMBULATORY_CARE_PROVIDER_SITE_OTHER): Payer: Managed Care, Other (non HMO) | Admitting: Family Medicine

## 2015-05-22 ENCOUNTER — Encounter: Payer: Self-pay | Admitting: Family Medicine

## 2015-05-22 VITALS — BP 112/70 | Ht 70.0 in | Wt 176.5 lb

## 2015-05-22 DIAGNOSIS — J301 Allergic rhinitis due to pollen: Secondary | ICD-10-CM

## 2015-05-22 DIAGNOSIS — F329 Major depressive disorder, single episode, unspecified: Secondary | ICD-10-CM | POA: Diagnosis not present

## 2015-05-22 DIAGNOSIS — I10 Essential (primary) hypertension: Secondary | ICD-10-CM | POA: Diagnosis not present

## 2015-05-22 DIAGNOSIS — Z794 Long term (current) use of insulin: Secondary | ICD-10-CM

## 2015-05-22 DIAGNOSIS — F32A Depression, unspecified: Secondary | ICD-10-CM

## 2015-05-22 DIAGNOSIS — E119 Type 2 diabetes mellitus without complications: Secondary | ICD-10-CM

## 2015-05-22 DIAGNOSIS — J309 Allergic rhinitis, unspecified: Secondary | ICD-10-CM | POA: Insufficient documentation

## 2015-05-22 MED ORDER — INSULIN NPH (HUMAN) (ISOPHANE) 100 UNIT/ML ~~LOC~~ SUSP: SUBCUTANEOUS | Status: DC

## 2015-05-22 MED ORDER — CITALOPRAM HYDROBROMIDE 20 MG PO TABS: ORAL_TABLET | ORAL | Status: DC

## 2015-05-22 NOTE — Progress Notes (Signed)
   Subjective:    Patient ID: Unnamed Paul Gonzalez, male    DOB: 03-Jun-1965, 50 y.o.   MRN: [id number] Patient arrives office with multiple concerns Diabetes He presents for his follow-up diabetic visit. He has type 2 diabetes mellitus. There are no hypoglycemic associated symptoms. There are no diabetic associated symptoms. There are no hypoglycemic complications. There are no diabetic complications. There are no known risk factors for coronary artery disease. Current diabetic treatment includes insulin injections. He is compliant with treatment all of the time.   Last A1C was 6.2 and was done on 04/14/15.   Patient needs his handicap form completed.    Staying active with exercise  Pos retinopathy sees reg  States sugars generally good when he checks it. Claims complete compliance with his insulin.  Notes overall his mood is stable. Continues to take the antidepressant. No major symptoms with anxiety or depression.  Having fairly rough time with allergies this spring. Claims compliance of Claritin. Not interested in further prescription medications.  Continues to receive dialysis without any significant concerns in this regard  Review of Systems No headache, no major weight loss or weight gain, no chest pain no back pain abdominal pain no change in bowel habits complete ROS otherwise negative     Objective:   Physical Exam  Alert vitals stable. HEENT mild nasal congestion pharynx normal neck supple. Lungs clear. Heart rare rhythm. Ankles without edema.      Assessment & Plan:  Impression 1 type 2 diabetes control now good #2 chronic renal failure discussed #3 depression/anxiety clinically stable discussed #4 retinopathy discussed last visit 6 months ago patient receiving eye interventions via the ophthalmologist #5 allergic rhinitis flare. Advised add over-the-counter steroid nasal spray when necessary. Plan all meds refilled diet exercise discussed further recommendations as noted  above WSL

## 2015-11-06 ENCOUNTER — Other Ambulatory Visit: Payer: Self-pay | Admitting: Family Medicine

## 2015-11-06 NOTE — Telephone Encounter (Signed)
Ok times one 

## 2015-11-18 LAB — HM DIABETES EYE EXAM

## 2015-11-25 ENCOUNTER — Encounter: Payer: Self-pay | Admitting: Family Medicine

## 2015-11-25 ENCOUNTER — Ambulatory Visit (INDEPENDENT_AMBULATORY_CARE_PROVIDER_SITE_OTHER): Payer: Managed Care, Other (non HMO) | Admitting: Family Medicine

## 2015-11-25 VITALS — BP 112/80 | Ht 70.0 in | Wt 180.0 lb

## 2015-11-25 DIAGNOSIS — F321 Major depressive disorder, single episode, moderate: Secondary | ICD-10-CM

## 2015-11-25 DIAGNOSIS — E119 Type 2 diabetes mellitus without complications: Secondary | ICD-10-CM | POA: Diagnosis not present

## 2015-11-25 DIAGNOSIS — E1140 Type 2 diabetes mellitus with diabetic neuropathy, unspecified: Secondary | ICD-10-CM

## 2015-11-25 DIAGNOSIS — E1121 Type 2 diabetes mellitus with diabetic nephropathy: Secondary | ICD-10-CM | POA: Diagnosis not present

## 2015-11-25 DIAGNOSIS — Z794 Long term (current) use of insulin: Secondary | ICD-10-CM

## 2015-11-25 DIAGNOSIS — I10 Essential (primary) hypertension: Secondary | ICD-10-CM

## 2015-11-25 MED ORDER — CITALOPRAM HYDROBROMIDE 20 MG PO TABS: ORAL_TABLET | ORAL | 1 refills | Status: DC

## 2015-11-25 MED ORDER — INSULIN NPH (HUMAN) (ISOPHANE) 100 UNIT/ML ~~LOC~~ SUSP: SUBCUTANEOUS | 1 refills | Status: DC

## 2015-11-25 NOTE — Progress Notes (Signed)
   Subjective:    Patient ID: Paul Gonzalez, male    DOB: [birth year], 50 y.o.   MRN: [id number]  Diabetes  He presents for his follow-up diabetic visit. He has type 2 diabetes mellitus. He is compliant with treatment all of the time. He is following a diabetic diet. He participates in exercise three times a week. He sees a podiatrist.Eye exam is current.   Pt had A1C done on 10/13/15 6.4.  140 ans 160  Three days er wk and styay  Active    Pt needs PA done on Novolin N.Or wonders if he can use a Humulin N. Next  History of hypertension. On no medications currently for. Trying to watch his salt intake. Exercising some.  Most morning sugars running in the low 100s.  History of diabetic neuropathy. Now sees a podiatrist for regular care.  Results for orders placed or performed in visit on 11/25/15  HM DIABETES EYE EXAM  Result Value Ref Range   HM Diabetic Eye Exam Retinopathy (A) No Retinopathy      Review of Systems No headache, no major weight loss or weight gain, no chest pain no back pain abdominal pain no change in bowel habits complete ROS otherwise negative     Objective:   Physical Exam  Alert vitals stable, NAD. Blood pressure good on repeat. HEENT normal. Lungs clear. Heart regular rate and rhythm.   Bilateral feet exam pulses good sensation diminished distally. Associated with callus formation plantar surface.      Assessment & Plan:  Impression 1 type 2 diabetes decent control long discussion held will change to with a Humulin n insulin. #2 hypertension controlled by diet and exercise blood pressure decent at this time. #3 sensory neuropathy discussed #4 depression element of anxiety stable on meds would like to stay on will maintain. Flu shot [place] given. Medications refilled. Diet exercise discussed. Recheck in 6 months for chronic visit plus wellness BUS

## 2016-04-22 ENCOUNTER — Other Ambulatory Visit: Payer: Self-pay | Admitting: Family Medicine

## 2016-05-25 ENCOUNTER — Ambulatory Visit (INDEPENDENT_AMBULATORY_CARE_PROVIDER_SITE_OTHER): Payer: Managed Care, Other (non HMO) | Admitting: Family Medicine

## 2016-05-25 ENCOUNTER — Ambulatory Visit (HOSPITAL_COMMUNITY)
Admission: RE | Admit: 2016-05-25 | Discharge: 2016-05-25 | Disposition: A | Discharge status: Home / Self Care | Payer: Managed Care, Other (non HMO) | Source: Ambulatory Visit | Attending: Family Medicine | Admitting: Family Medicine

## 2016-05-25 ENCOUNTER — Encounter: Payer: Self-pay | Admitting: Family Medicine

## 2016-05-25 VITALS — BP 112/70 | Ht 68.1 in | Wt 178.4 lb

## 2016-05-25 DIAGNOSIS — Z125 Encounter for screening for malignant neoplasm of prostate: Secondary | ICD-10-CM

## 2016-05-25 DIAGNOSIS — I10 Essential (primary) hypertension: Secondary | ICD-10-CM

## 2016-05-25 DIAGNOSIS — E119 Type 2 diabetes mellitus without complications: Secondary | ICD-10-CM | POA: Diagnosis not present

## 2016-05-25 DIAGNOSIS — Z0000 Encounter for general adult medical examination without abnormal findings: Secondary | ICD-10-CM | POA: Diagnosis not present

## 2016-05-25 DIAGNOSIS — L97519 Non-pressure chronic ulcer of other part of right foot with unspecified severity: Secondary | ICD-10-CM | POA: Diagnosis not present

## 2016-05-25 DIAGNOSIS — E785 Hyperlipidemia, unspecified: Secondary | ICD-10-CM | POA: Diagnosis not present

## 2016-05-25 DIAGNOSIS — Z794 Long term (current) use of insulin: Secondary | ICD-10-CM | POA: Diagnosis not present

## 2016-05-25 DIAGNOSIS — F321 Major depressive disorder, single episode, moderate: Secondary | ICD-10-CM | POA: Diagnosis not present

## 2016-05-25 DIAGNOSIS — Z1211 Encounter for screening for malignant neoplasm of colon: Secondary | ICD-10-CM

## 2016-05-25 DIAGNOSIS — E1140 Type 2 diabetes mellitus with diabetic neuropathy, unspecified: Secondary | ICD-10-CM | POA: Diagnosis not present

## 2016-05-25 MED ORDER — CIPROFLOXACIN HCL 500 MG PO TABS: ORAL_TABLET | ORAL | 0 refills | Status: DC

## 2016-05-25 MED ORDER — INSULIN NPH (HUMAN) (ISOPHANE) 100 UNIT/ML ~~LOC~~ SUSP: SUBCUTANEOUS | 5 refills | Status: DC

## 2016-05-25 MED ORDER — CITALOPRAM HYDROBROMIDE 20 MG PO TABS: ORAL_TABLET | ORAL | 1 refills | Status: DC

## 2016-05-25 MED ORDER — AMOXICILLIN-POT CLAVULANATE 875-125 MG PO TABS: ORAL_TABLET | ORAL | 0 refills | Status: DC

## 2016-05-25 NOTE — Progress Notes (Signed)
Subjective:    Patient ID: Paul Gonzalez, male    DOB: 1965-07-04, 51 y.o.   MRN: [id number]  HPI The patient comes in today for a wellness visit.    A review of their health history was completed.  A review of medications was also completed.  Any needed refills; None   Eating habits: Patient states eating habits are good  Falls/  MVA accidents in past few months: None   Regular exercise: Patient states walks daily.   Specialist pt sees on regular basis: Nephrologist  Preventative health issues were discussed.   Additional concerns:  Has concerns of sore to right foot. Patient claims this came up just within the last week. He wore sandals while working in garden. Developed a subsequent ulcer. It's been draining somewhat some pain. Notes diminished sensation in his feet  Blood pressure medicine and blood pressure levels reviewed today with patient. Compliant with blood pressure medicine. States does not miss a dose. No obvious side effects. Blood pressure generally good when checked elsewhere. Watching salt intake.  Morn numbers 140  6.9% A1c  >htn  Patient claims compliance with diabetes medication. No obvious side effects. Reports no substantial low sugar spells. Most numbers are generally in good range when checked fasting. Generally does not miss a dose of medication. Watching diabetic diet closely  No fam of colon ca or prostate ca  Sore blister on the foot,  No fevr       Review of Systems  Constitutional: Negative for activity change, appetite change and fever.  HENT: Negative for congestion and rhinorrhea.   Eyes: Negative for discharge.  Respiratory: Negative for cough and wheezing.   Cardiovascular: Negative for chest pain.  Gastrointestinal: Negative for abdominal pain, blood in stool and vomiting.  Genitourinary: Negative for difficulty urinating and frequency.  Musculoskeletal: Negative for neck pain.  Skin: Negative for rash.    Allergic/Immunologic: Negative for environmental allergies and food allergies.  Neurological: Negative for weakness and headaches.  Psychiatric/Behavioral: Negative for agitation.       Objective:   Physical Exam  Constitutional: He appears well-developed and well-nourished.  HENT:  Head: Normocephalic and atraumatic.  Right Ear: External ear normal.  Left Ear: External ear normal.  Nose: Nose normal.  Mouth/Throat: Oropharynx is clear and moist.  Eyes: EOM are normal. Pupils are equal, round, and reactive to light.  Neck: Normal range of motion. Neck supple. No thyromegaly present.  Cardiovascular: Normal rate, regular rhythm and normal heart sounds.   No murmur heard. Pulmonary/Chest: Effort normal and breath sounds normal. No respiratory distress. He has no wheezes.  Abdominal: Soft. Bowel sounds are normal. He exhibits no distension and no mass. There is no tenderness.  Genitourinary: Penis normal.  Musculoskeletal: Normal range of motion. He exhibits no edema.  Lymphadenopathy:    He has no cervical adenopathy.  Neurological: He is alert. He exhibits normal muscle tone.  Skin: Skin is warm and dry. No erythema.  Right lateral foot ulceration evident with surrounding erythema tender some active drainage  Psychiatric: He has a normal mood and affect. His behavior is normal. Judgment normal.  Vitals reviewed.         Assessment & Plan:  Impression 1 wellness exam. Patient needs colonoscopy. Diet exercise discussed anticipatory guidance given #2 type 2 diabetes discussed A1c good recently at nephrologist. 6.9%. #3 hypertension good control discussed compliance discussed maintain same meds #4 diabetic foot ulcer new problem. Concerning discussed at length. Initiate antibiotics. X-ray foot. [place]  [place]. Close follow-up in one week

## 2016-06-01 ENCOUNTER — Encounter: Payer: Self-pay | Admitting: Family Medicine

## 2016-06-01 ENCOUNTER — Ambulatory Visit (INDEPENDENT_AMBULATORY_CARE_PROVIDER_SITE_OTHER): Payer: Managed Care, Other (non HMO) | Admitting: Family Medicine

## 2016-06-01 VITALS — BP 128/82 | Ht 68.0 in | Wt 179.4 lb

## 2016-06-01 DIAGNOSIS — L97519 Non-pressure chronic ulcer of other part of right foot with unspecified severity: Secondary | ICD-10-CM

## 2016-06-01 NOTE — Progress Notes (Signed)
   Subjective:    Patient ID: Paul Gonzalez, male    DOB: May 30, 1965, 51 y.o.   MRN: [id number]  HPI Patient arrives for a follow up on right foot ulcer.  Did not get those [facility] filled out due to cost.  Handling the antibiotic well.  Had foot x-rays which were negative  Notes no major pain or tenderness. Was discharge. Applying dressing every day.     Review of Systems No headache, no major weight loss or weight gain, no chest pain no back pain abdominal pain no change in bowel habits complete ROS otherwise negative     Objective:   Physical Exam  Alert vitals stable, NAD. Blood pressure good on repeat. HEENT normal. Lungs clear. Heart regular rate and rhythm. Right lateral foot ulceration less inflamed no active discharge appears to have started to heal and the edges. Slight eschar formation      Assessment & Plan:  Impression diabetic foot ulcers see as noted above plan encouraged daily dressing. Have the folks at dialysis evaluate weekly. Recheck in 3 weeks. Wound care discussed. Finish antibiotics warning signs discussed

## 2016-06-11 LAB — BASIC METABOLIC PANEL
BUN/Creatinine Ratio: 6 — ABNORMAL LOW (ref 9–20)
BUN: 20 mg/dL (ref 6–24)
CO2: 28 mmol/L (ref 18–29)
Calcium: 8.6 mg/dL — ABNORMAL LOW (ref 8.7–10.2)
Chloride: 103 mmol/L (ref 96–106)
Creatinine, Ser: 3.22 mg/dL — ABNORMAL HIGH (ref 0.76–1.27)
GFR calc Af Amer: 25 mL/min/{1.73_m2} — ABNORMAL LOW (ref >59)
GFR, EST NON AFRICAN AMERICAN: 21 mL/min/{1.73_m2} — AB (ref >59)
GLUCOSE: 101 mg/dL — AB (ref 65–99)
POTASSIUM: 4 mmol/L (ref 3.5–5.2)
Sodium: 147 mmol/L — ABNORMAL HIGH (ref 134–144)

## 2016-06-11 LAB — HEPATIC FUNCTION PANEL
ALBUMIN: 4.3 g/dL (ref 3.5–5.5)
ALT: 21 IU/L (ref 0–44)
AST: 19 IU/L (ref 0–40)
Alkaline Phosphatase: 94 IU/L (ref 39–117)
Bilirubin Total: 0.3 mg/dL (ref 0.0–1.2)
Bilirubin, Direct: 0.06 mg/dL (ref 0.00–0.40)
TOTAL PROTEIN: 6.7 g/dL (ref 6.0–8.5)

## 2016-06-11 LAB — LIPID PANEL
Chol/HDL Ratio: 6.2 ratio — ABNORMAL HIGH (ref 0.0–5.0)
Cholesterol, Total: 187 mg/dL (ref 100–199)
HDL: 30 mg/dL — ABNORMAL LOW (ref >39)
LDL CALC: 104 mg/dL — AB (ref 0–99)
Triglycerides: 264 mg/dL — ABNORMAL HIGH (ref 0–149)
VLDL Cholesterol Cal: 53 mg/dL — ABNORMAL HIGH (ref 5–40)

## 2016-06-11 LAB — PSA: PROSTATE SPECIFIC AG, SERUM: 0.2 ng/mL (ref 0.0–4.0)

## 2016-06-22 ENCOUNTER — Ambulatory Visit: Payer: Medicare Other | Admitting: Family Medicine

## 2016-06-24 ENCOUNTER — Other Ambulatory Visit: Payer: Self-pay

## 2016-06-24 ENCOUNTER — Telehealth: Payer: Self-pay

## 2016-06-24 DIAGNOSIS — Z1211 Encounter for screening for malignant neoplasm of colon: Secondary | ICD-10-CM

## 2016-06-24 MED ORDER — PEG 3350-KCL-NA BICARB-NACL 420 G PO SOLR: 4000.0000 mL | ORAL | 0 refills | Status: DC

## 2016-06-24 NOTE — Telephone Encounter (Signed)
Rx sent to the pharmacy and instructions mailed to pt.  

## 2016-06-24 NOTE — Telephone Encounter (Addendum)
Gastroenterology Pre-Procedure Review  Request Date: 06/24/2016 Requesting Physician: Dr. 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 Diabetes Melitis: YES 2. Joint replacements in the past 12 months: no 3. Major health problems in the past 3 months: no 4. Has an artificial valve or MVP: no 5. Has a defibrillator: no 6. Has been advised in past to take antibiotics in advance of a procedure like teeth cleaning: no 7. Family history of colon cancer: no  8. Alcohol Use: no 9. History of sleep apnea: no  10. History of coronary artery or other vascular stents placed within the last 12 months: no    MEDICATIONS & ALLERGIES:    Patient reports the following regarding taking any blood thinners:   Plavix? no Aspirin? no Coumadin? no Brilinta? no Xarelto? no Eliquis? no Pradaxa? no Savaysa? no Effient? no  Patient confirms/reports the following medications:  Current Outpatient Prescriptions  Medication Sig Dispense Refill  . citalopram (CELEXA) 20 MG tablet TAKE 1 BY MOUTH DAILY 90 tablet 1  . insulin NPH Human (HUMULIN N) 100 UNIT/ML injection 10 units qhs 10 mL 5  . sevelamer carbonate (RENVELA) 800 MG tablet Take 800 mg by mouth daily.     No current facility-administered medications for this visit.     Patient confirms/reports the following allergies:  No Known Allergies  No orders of the defined types were placed in this encounter.   AUTHORIZATION INFORMATION Primary Insurance:   ID #:  Group #:  Pre-Cert / Auth required Pre-Cert / Auth #:   Secondary Insurance:   ID #:  Group #: Pre-Cert / Auth required:  Pre-Cert / Auth #:   SCHEDULE INFORMATION: Procedure has been scheduled as follows:  Date:                  Time:   Location:  This  Gastroenterology Pre-Precedure Review Form is being routed to the following provider(s): R. Garfield Cornea, MD

## 2016-06-24 NOTE — Telephone Encounter (Signed)
OK to schedule on a T/Th. Trilyte prep.  Day before procedure: Humulin 5 units at bedtime (1/2 dose). AM of procedure: No diabetes meds.

## 2016-06-29 NOTE — Telephone Encounter (Signed)
NO PA is needed for TCS 

## 2016-08-12 ENCOUNTER — Ambulatory Visit (HOSPITAL_COMMUNITY)
Admission: RE | Admit: 2016-08-12 | Discharge: 2016-08-12 | Disposition: A | Discharge status: Home / Self Care | Payer: Managed Care, Other (non HMO) | Source: Ambulatory Visit | Attending: Internal Medicine | Admitting: Internal Medicine

## 2016-08-12 ENCOUNTER — Encounter (HOSPITAL_COMMUNITY): Payer: Self-pay | Admitting: *Deleted

## 2016-08-12 ENCOUNTER — Encounter (HOSPITAL_COMMUNITY)
Admission: RE | Disposition: A | Discharge status: Home / Self Care | Payer: Self-pay | Source: Ambulatory Visit | Attending: Internal Medicine

## 2016-08-12 DIAGNOSIS — D124 Benign neoplasm of descending colon: Secondary | ICD-10-CM | POA: Diagnosis not present

## 2016-08-12 DIAGNOSIS — Z79899 Other long term (current) drug therapy: Secondary | ICD-10-CM | POA: Diagnosis not present

## 2016-08-12 DIAGNOSIS — Z1212 Encounter for screening for malignant neoplasm of rectum: Secondary | ICD-10-CM | POA: Diagnosis not present

## 2016-08-12 DIAGNOSIS — Z1211 Encounter for screening for malignant neoplasm of colon: Secondary | ICD-10-CM

## 2016-08-12 DIAGNOSIS — E785 Hyperlipidemia, unspecified: Secondary | ICD-10-CM | POA: Diagnosis not present

## 2016-08-12 DIAGNOSIS — Z794 Long term (current) use of insulin: Secondary | ICD-10-CM | POA: Diagnosis not present

## 2016-08-12 DIAGNOSIS — E1122 Type 2 diabetes mellitus with diabetic chronic kidney disease: Secondary | ICD-10-CM | POA: Diagnosis not present

## 2016-08-12 DIAGNOSIS — I129 Hypertensive chronic kidney disease with stage 1 through stage 4 chronic kidney disease, or unspecified chronic kidney disease: Secondary | ICD-10-CM | POA: Diagnosis not present

## 2016-08-12 DIAGNOSIS — Z800 Family history of malignant neoplasm of digestive organs: Secondary | ICD-10-CM | POA: Insufficient documentation

## 2016-08-12 DIAGNOSIS — Z9889 Other specified postprocedural states: Secondary | ICD-10-CM | POA: Insufficient documentation

## 2016-08-12 DIAGNOSIS — D122 Benign neoplasm of ascending colon: Secondary | ICD-10-CM | POA: Diagnosis not present

## 2016-08-12 DIAGNOSIS — H547 Unspecified visual loss: Secondary | ICD-10-CM | POA: Diagnosis not present

## 2016-08-12 DIAGNOSIS — N185 Chronic kidney disease, stage 5: Secondary | ICD-10-CM | POA: Diagnosis not present

## 2016-08-12 HISTORY — PX: POLYPECTOMY: SHX5525

## 2016-08-12 HISTORY — PX: COLONOSCOPY: SHX5424

## 2016-08-12 HISTORY — DX: Unspecified visual loss: H54.7

## 2016-08-12 LAB — GLUCOSE, CAPILLARY: Glucose-Capillary: 95 mg/dL (ref 65–99)

## 2016-08-12 SURGERY — COLONOSCOPY
Anesthesia: Moderate Sedation

## 2016-08-12 MED ORDER — MEPERIDINE HCL 100 MG/ML IJ SOLN
INTRAMUSCULAR | Status: DC | PRN
  Administered 2016-08-12: 25 mg via INTRAVENOUS
  Administered 2016-08-12: 50 mg via INTRAVENOUS

## 2016-08-12 MED ORDER — ONDANSETRON HCL 4 MG/2ML IJ SOLN
INTRAMUSCULAR | Status: DC | PRN
  Administered 2016-08-12: 4 mg via INTRAVENOUS

## 2016-08-12 MED ORDER — MIDAZOLAM HCL 5 MG/5ML IJ SOLN
INTRAMUSCULAR | Status: AC
  Filled 2016-08-12: qty 10

## 2016-08-12 MED ORDER — ONDANSETRON HCL 4 MG/2ML IJ SOLN
INTRAMUSCULAR | Status: AC
  Filled 2016-08-12: qty 2

## 2016-08-12 MED ORDER — MIDAZOLAM HCL 5 MG/5ML IJ SOLN
INTRAMUSCULAR | Status: DC | PRN
  Administered 2016-08-12 (×2): 2 mg via INTRAVENOUS

## 2016-08-12 MED ORDER — MIDAZOLAM HCL 5 MG/5ML IJ SOLN
INTRAMUSCULAR | Status: DC | PRN
  Administered 2016-08-12: 1 mg via INTRAVENOUS

## 2016-08-12 MED ORDER — STERILE WATER FOR IRRIGATION IR SOLN
Status: DC | PRN
  Administered 2016-08-12: 2.5 mL

## 2016-08-12 MED ORDER — SODIUM CHLORIDE 0.9 % IV SOLN
INTRAVENOUS | Status: DC
  Administered 2016-08-12: 09:00:00 via INTRAVENOUS

## 2016-08-12 MED ORDER — MEPERIDINE HCL 100 MG/ML IJ SOLN
INTRAMUSCULAR | Status: AC
  Filled 2016-08-12: qty 2

## 2016-08-12 NOTE — Discharge Instructions (Addendum)
°Colonoscopy °Discharge Instructions ° °Read the instructions outlined below and refer to this sheet in the next few weeks. These discharge instructions provide you with general information on caring for yourself after you leave the hospital. Your doctor may also give you specific instructions. While your treatment has been planned according to the most current medical practices available, unavoidable complications occasionally occur. If you have any problems or questions after discharge, call Dr. Rourk at [phone]. °ACTIVITY °· You may resume your regular activity, but move at a slower pace for the next 24 hours.  °· Take frequent rest periods for the next 24 hours.  °· Walking will help get rid of the air and reduce the bloated feeling in your belly (abdomen).  °· No driving for 24 hours (because of the medicine (anesthesia) used during the test).   °· Do not sign any important legal documents or operate any machinery for 24 hours (because of the anesthesia used during the test).  °NUTRITION °· Drink plenty of fluids.  °· You may resume your normal diet as instructed by your doctor.  °· Begin with a light meal and progress to your normal diet. Heavy or fried foods are harder to digest and may make you feel sick to your stomach (nauseated).  °· Avoid alcoholic beverages for 24 hours or as instructed.  °MEDICATIONS °· You may resume your normal medications unless your doctor tells you otherwise.  °WHAT YOU CAN EXPECT TODAY °· Some feelings of bloating in the abdomen.  °· Passage of more gas than usual.  °· Spotting of blood in your stool or on the toilet paper.  °IF YOU HAD POLYPS REMOVED DURING THE COLONOSCOPY: °· No aspirin products for 7 days or as instructed.  °· No alcohol for 7 days or as instructed.  °· Eat a soft diet for the next 24 hours.  °FINDING OUT THE RESULTS OF YOUR TEST °Not all test results are available during your visit. If your test results are not back during the visit, make an appointment  with your caregiver to find out the results. Do not assume everything is normal if you have not heard from your caregiver or the medical facility. It is important for you to follow up on all of your test results.  °SEEK IMMEDIATE MEDICAL ATTENTION IF: °· You have more than a spotting of blood in your stool.  °· Your belly is swollen (abdominal distention).  °· You are nauseated or vomiting.  °· You have a temperature over 101.  °· You have abdominal pain or discomfort that is severe or gets worse throughout the day.  ° ° °Colon polyp information provided ° °Further recommendations to follow pending review of pathology report. ° ° °Colon Polyps °Polyps are tissue growths inside the body. Polyps can grow in many places, including the large intestine (colon). A polyp may be a round bump or a mushroom-shaped growth. You could have one polyp or several. °Most colon polyps are noncancerous (benign). However, some colon polyps can become cancerous over time. °What are the causes? °The exact cause of colon polyps is not known. °What increases the risk? °This condition is more likely to develop in people who: °· Have a family history of colon cancer or colon polyps. °· Are older than 50 or older than 45 if they are African American. °· Have inflammatory bowel disease, such as ulcerative colitis or Crohn disease. °· Are overweight. °· Smoke cigarettes. °· Do not get enough exercise. °· Drink too much alcohol. °·   Eat a diet that is: °? High in fat and red meat. °? Low in fiber. °· Had childhood cancer that was treated with abdominal radiation. ° °What are the signs or symptoms? °Most polyps do not cause symptoms. If you have symptoms, they may include: °· Blood coming from your rectum when having a bowel movement. °· Blood in your stool. The stool may look dark red or black. °· A change in bowel habits, such as constipation or diarrhea. ° °How is this diagnosed? °This condition is diagnosed with a colonoscopy. This is a  procedure that uses a lighted, flexible scope to look at the inside of your colon. °How is this treated? °Treatment for this condition involves removing any polyps that are found. Those polyps will then be tested for cancer. If cancer is found, your health care provider will talk to you about options for colon cancer treatment. °Follow these instructions at home: °Diet °· Eat plenty of fiber, such as fruits, vegetables, and whole grains. °· Eat foods that are high in calcium and vitamin D, such as milk, cheese, yogurt, eggs, liver, fish, and broccoli. °· Limit foods high in fat, red meats, and processed meats, such as hot dogs, sausage, bacon, and lunch meats. °· Maintain a healthy weight, or lose weight if recommended by your health care provider. °General instructions °· Do not smoke cigarettes. °· Do not drink alcohol excessively. °· Keep all follow-up visits as told by your health care provider. This is important. This includes keeping regularly scheduled colonoscopies. Talk to your health care provider about when you need a colonoscopy. °· Exercise every day or as told by your health care provider. °Contact a health care provider if: °· You have new or worsening bleeding during a bowel movement. °· You have new or increased blood in your stool. °· You have a change in bowel habits. °· You unexpectedly lose weight. °This information is not intended to replace advice given to you by your health care provider. Make sure you discuss any questions you have with your health care provider. °Document Released: 09/17/2003 Document Revised: 05/29/2015 Document Reviewed: 11/11/2014 °Elsevier Interactive Patient Education © 2018 [employer]vier Inc. ° °
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
## 2016-08-12 NOTE — Op Note (Signed)
[facility] Patient Name: Paul Gonzalez Procedure Date: 08/12/2016 9:09 AM MRN: [id number] Date of Birth: 06/29/1965 Attending MD: Norvel Richards , MD CSN: [id number] Age: 52 Admit Type: Outpatient Procedure:                Colonoscopy Indications:              Screening for colorectal malignant neoplasm Providers:                Norvel Richards, MD, Lurline Del, RN, Randa Spike, Technician Referring MD:              Medicines:                Midazolam 5 mg IV, Meperidine 75 mg IV, Ondansetron                            4 mg IV Complications:            No immediate complications. Estimated Blood Loss:     Estimated blood loss was minimal. Procedure:                Pre-Anesthesia Assessment:                           - Prior to the procedure, a History and Physical                            was performed, and patient medications and                            allergies were reviewed. The patient's tolerance of                            previous anesthesia was also reviewed. The risks                            and benefits of the procedure and the sedation                            options and risks were discussed with the patient.                            All questions were answered, and informed consent                            was obtained. Prior Anticoagulants: The patient has                            taken no previous anticoagulant or antiplatelet                            agents. ASA Grade Assessment: III - A patient with  severe systemic disease. After reviewing the risks                            and benefits, the patient was deemed in                            satisfactory condition to undergo the procedure.                           After obtaining informed consent, the colonoscope                            was passed under direct vision. Throughout the                            procedure,  the patient's blood pressure, pulse, and                            oxygen saturations were monitored continuously. The                            Colonoscope was introduced through the anus and                            advanced to the the cecum, identified by                            appendiceal orifice and ileocecal valve. The                            colonoscopy was performed without difficulty. The                            patient tolerated the procedure well. The quality                            of the bowel preparation was adequate. The                            ileocecal valve, appendiceal orifice, and rectum                            were photographed. The colonoscopy was performed                            without difficulty. The patient tolerated the                            procedure well. Scope In: 9:45:25 AM Scope Out: 10:05:12 AM Scope Withdrawal Time: 0 hours 12 minutes 40 seconds  Total Procedure Duration: 0 hours 19 minutes 47 seconds  Findings:      The perianal and digital rectal examinations were normal.      A 4 mm polyp was found in the ascending colon. The polyp was  semi-pedunculated. The polyp was removed with a cold snare. Resection       and retrieval were complete. Estimated blood loss was minimal.      A 8 mm polyp was found in the descending colon. The polyp was       pedunculated. The polyp was removed with a hot snare. Resection and       retrieval were complete. Estimated blood loss: none.      The exam was otherwise without abnormality on direct and retroflexion       views. Impression:               - One 4 mm polyp in the ascending colon, removed                            with a cold snare. Resected and retrieved.                           - One 8 mm polyp in the descending colon, removed                            with a hot snare. Resected and retrieved.                           - The examination was otherwise normal on direct                             and retroflexion views. Moderate Sedation:      Moderate (conscious) sedation was administered by the endoscopy nurse       and supervised by the endoscopist. The following parameters were       monitored: oxygen saturation, heart rate, blood pressure, respiratory       rate, EKG, adequacy of pulmonary ventilation, and response to care.       Total physician intraservice time was 30 minutes. Recommendation:           - Patient has a contact number available for                            emergencies. The signs and symptoms of potential                            delayed complications were discussed with the                            patient. Return to normal activities tomorrow.                            Written discharge instructions were provided to the                            patient.                           - Resume previous diet.                           -  Patient has a contact number available for                            emergencies. The signs and symptoms of potential                            delayed complications were discussed with the                            patient. Return to normal activities tomorrow.                            Written discharge instructions were provided to the                            patient.                           - Resume previous diet.                           - Repeat colonoscopy date to be determined after                            pending pathology results are reviewed for                            surveillance.                           - Return to GI office (date not yet determined). Procedure Code(s):        --- Professional ---                           [phone], Colonoscopy, flexible; with removal of                            tumor(s), polyp(s), or other lesion(s) by snare                            technique                           99152, Moderate sedation services provided by the                             same physician or other qualified health care                            professional performing the diagnostic or                            therapeutic service that the sedation supports,                            requiring the presence of an  independent trained                            observer to assist in the monitoring of the                            patient's level of consciousness and physiological                            status; initial 15 minutes of intraservice time,                            patient age 80 years or older                           [phone], Moderate sedation services; each additional                            15 minutes intraservice time Diagnosis Code(s):        --- Professional ---                           Z12.11, Encounter for screening for malignant                            neoplasm of colon                           D12.2, Benign neoplasm of ascending colon                           D12.4, Benign neoplasm of descending colon CPT copyright 2016 American Medical Association. All rights reserved. The codes documented in this report are preliminary and upon coder review may  be revised to meet current compliance requirements. Paul Gonzalez. Paul Mainwaring, MD Norvel Richards, MD 08/12/2016 40:10:27 AM This report has been signed electronically. Number of Addenda: 0

## 2016-08-16 ENCOUNTER — Encounter: Payer: Self-pay | Admitting: Internal Medicine

## 2016-08-17 ENCOUNTER — Encounter (HOSPITAL_COMMUNITY): Payer: Self-pay | Admitting: Internal Medicine

## 2016-10-04 ENCOUNTER — Telehealth: Payer: Self-pay | Admitting: Family Medicine

## 2016-10-04 NOTE — Telephone Encounter (Signed)
Pt dropped off a prescription assistance form to be filled out and signed for his humalin. Form is in nurse box.

## 2016-10-04 NOTE — Telephone Encounter (Signed)
Last seen 05/25/16. Form in dr steve's folder

## 2016-10-07 ENCOUNTER — Telehealth: Payer: Self-pay | Admitting: Family Medicine

## 2016-10-07 MED ORDER — LORATADINE 10 MG PO TABS: 10.0000 mg | ORAL_TABLET | Freq: Every day | ORAL | 12 refills | Status: DC

## 2016-10-07 NOTE — Telephone Encounter (Signed)
This would be fine to send them into the pharmacy-I'm not quite sure why he needs a prescription for this it is available over-the-counter [facility] it 30 pills for 4 dollars-if he needs a prescription send in 30 days with 12 refills

## 2016-10-07 NOTE — Telephone Encounter (Signed)
Spoke with patient and informed him that medication was sent into pharmacy. Patient verbalized understanding.

## 2016-10-07 NOTE — Telephone Encounter (Signed)
Pt is needing a prescription for loratadine (CLARITIN) 10 MG tablet   [facility] [place]

## 2016-10-07 NOTE — Telephone Encounter (Signed)
Left message return call (medication sent into pharmacy) 

## 2016-10-07 NOTE — Telephone Encounter (Signed)
Medication is not currently on patient's medication list. Please advise?

## 2017-01-14 ENCOUNTER — Other Ambulatory Visit: Payer: Self-pay | Admitting: Family Medicine

## 2017-01-14 NOTE — Telephone Encounter (Signed)
1 refill needs follow-up office visit by spring

## 2017-02-04 DIAGNOSIS — D509 Iron deficiency anemia, unspecified: Secondary | ICD-10-CM | POA: Diagnosis not present

## 2017-02-04 DIAGNOSIS — N2581 Secondary hyperparathyroidism of renal origin: Secondary | ICD-10-CM | POA: Diagnosis not present

## 2017-02-04 DIAGNOSIS — Z992 Dependence on renal dialysis: Secondary | ICD-10-CM | POA: Diagnosis not present

## 2017-02-04 DIAGNOSIS — D631 Anemia in chronic kidney disease: Secondary | ICD-10-CM | POA: Diagnosis not present

## 2017-02-04 DIAGNOSIS — N186 End stage renal disease: Secondary | ICD-10-CM | POA: Diagnosis not present

## 2017-02-07 DIAGNOSIS — N2581 Secondary hyperparathyroidism of renal origin: Secondary | ICD-10-CM | POA: Diagnosis not present

## 2017-02-07 DIAGNOSIS — N186 End stage renal disease: Secondary | ICD-10-CM | POA: Diagnosis not present

## 2017-02-07 DIAGNOSIS — D509 Iron deficiency anemia, unspecified: Secondary | ICD-10-CM | POA: Diagnosis not present

## 2017-02-07 DIAGNOSIS — D631 Anemia in chronic kidney disease: Secondary | ICD-10-CM | POA: Diagnosis not present

## 2017-02-07 DIAGNOSIS — Z992 Dependence on renal dialysis: Secondary | ICD-10-CM | POA: Diagnosis not present

## 2017-02-09 DIAGNOSIS — Z992 Dependence on renal dialysis: Secondary | ICD-10-CM | POA: Diagnosis not present

## 2017-02-09 DIAGNOSIS — N2581 Secondary hyperparathyroidism of renal origin: Secondary | ICD-10-CM | POA: Diagnosis not present

## 2017-02-09 DIAGNOSIS — D631 Anemia in chronic kidney disease: Secondary | ICD-10-CM | POA: Diagnosis not present

## 2017-02-09 DIAGNOSIS — N186 End stage renal disease: Secondary | ICD-10-CM | POA: Diagnosis not present

## 2017-02-09 DIAGNOSIS — D509 Iron deficiency anemia, unspecified: Secondary | ICD-10-CM | POA: Diagnosis not present

## 2017-02-11 DIAGNOSIS — D509 Iron deficiency anemia, unspecified: Secondary | ICD-10-CM | POA: Diagnosis not present

## 2017-02-11 DIAGNOSIS — N186 End stage renal disease: Secondary | ICD-10-CM | POA: Diagnosis not present

## 2017-02-11 DIAGNOSIS — Z992 Dependence on renal dialysis: Secondary | ICD-10-CM | POA: Diagnosis not present

## 2017-02-11 DIAGNOSIS — D631 Anemia in chronic kidney disease: Secondary | ICD-10-CM | POA: Diagnosis not present

## 2017-02-11 DIAGNOSIS — N2581 Secondary hyperparathyroidism of renal origin: Secondary | ICD-10-CM | POA: Diagnosis not present

## 2017-02-14 DIAGNOSIS — D631 Anemia in chronic kidney disease: Secondary | ICD-10-CM | POA: Diagnosis not present

## 2017-02-14 DIAGNOSIS — N2581 Secondary hyperparathyroidism of renal origin: Secondary | ICD-10-CM | POA: Diagnosis not present

## 2017-02-14 DIAGNOSIS — D509 Iron deficiency anemia, unspecified: Secondary | ICD-10-CM | POA: Diagnosis not present

## 2017-02-14 DIAGNOSIS — N186 End stage renal disease: Secondary | ICD-10-CM | POA: Diagnosis not present

## 2017-02-14 DIAGNOSIS — Z992 Dependence on renal dialysis: Secondary | ICD-10-CM | POA: Diagnosis not present

## 2017-02-16 DIAGNOSIS — D631 Anemia in chronic kidney disease: Secondary | ICD-10-CM | POA: Diagnosis not present

## 2017-02-16 DIAGNOSIS — N186 End stage renal disease: Secondary | ICD-10-CM | POA: Diagnosis not present

## 2017-02-16 DIAGNOSIS — Z992 Dependence on renal dialysis: Secondary | ICD-10-CM | POA: Diagnosis not present

## 2017-02-16 DIAGNOSIS — D509 Iron deficiency anemia, unspecified: Secondary | ICD-10-CM | POA: Diagnosis not present

## 2017-02-16 DIAGNOSIS — N2581 Secondary hyperparathyroidism of renal origin: Secondary | ICD-10-CM | POA: Diagnosis not present

## 2017-02-18 DIAGNOSIS — D509 Iron deficiency anemia, unspecified: Secondary | ICD-10-CM | POA: Diagnosis not present

## 2017-02-18 DIAGNOSIS — N186 End stage renal disease: Secondary | ICD-10-CM | POA: Diagnosis not present

## 2017-02-18 DIAGNOSIS — D631 Anemia in chronic kidney disease: Secondary | ICD-10-CM | POA: Diagnosis not present

## 2017-02-18 DIAGNOSIS — N2581 Secondary hyperparathyroidism of renal origin: Secondary | ICD-10-CM | POA: Diagnosis not present

## 2017-02-18 DIAGNOSIS — Z992 Dependence on renal dialysis: Secondary | ICD-10-CM | POA: Diagnosis not present

## 2017-02-21 DIAGNOSIS — N2581 Secondary hyperparathyroidism of renal origin: Secondary | ICD-10-CM | POA: Diagnosis not present

## 2017-02-21 DIAGNOSIS — N186 End stage renal disease: Secondary | ICD-10-CM | POA: Diagnosis not present

## 2017-02-21 DIAGNOSIS — D509 Iron deficiency anemia, unspecified: Secondary | ICD-10-CM | POA: Diagnosis not present

## 2017-02-21 DIAGNOSIS — D631 Anemia in chronic kidney disease: Secondary | ICD-10-CM | POA: Diagnosis not present

## 2017-02-21 DIAGNOSIS — Z992 Dependence on renal dialysis: Secondary | ICD-10-CM | POA: Diagnosis not present

## 2017-02-23 DIAGNOSIS — N186 End stage renal disease: Secondary | ICD-10-CM | POA: Diagnosis not present

## 2017-02-23 DIAGNOSIS — D631 Anemia in chronic kidney disease: Secondary | ICD-10-CM | POA: Diagnosis not present

## 2017-02-23 DIAGNOSIS — Z992 Dependence on renal dialysis: Secondary | ICD-10-CM | POA: Diagnosis not present

## 2017-02-23 DIAGNOSIS — D509 Iron deficiency anemia, unspecified: Secondary | ICD-10-CM | POA: Diagnosis not present

## 2017-02-23 DIAGNOSIS — N2581 Secondary hyperparathyroidism of renal origin: Secondary | ICD-10-CM | POA: Diagnosis not present

## 2017-02-25 DIAGNOSIS — N2581 Secondary hyperparathyroidism of renal origin: Secondary | ICD-10-CM | POA: Diagnosis not present

## 2017-02-25 DIAGNOSIS — D631 Anemia in chronic kidney disease: Secondary | ICD-10-CM | POA: Diagnosis not present

## 2017-02-25 DIAGNOSIS — N186 End stage renal disease: Secondary | ICD-10-CM | POA: Diagnosis not present

## 2017-02-25 DIAGNOSIS — D509 Iron deficiency anemia, unspecified: Secondary | ICD-10-CM | POA: Diagnosis not present

## 2017-02-25 DIAGNOSIS — Z992 Dependence on renal dialysis: Secondary | ICD-10-CM | POA: Diagnosis not present

## 2017-02-28 DIAGNOSIS — N2581 Secondary hyperparathyroidism of renal origin: Secondary | ICD-10-CM | POA: Diagnosis not present

## 2017-02-28 DIAGNOSIS — D509 Iron deficiency anemia, unspecified: Secondary | ICD-10-CM | POA: Diagnosis not present

## 2017-02-28 DIAGNOSIS — N186 End stage renal disease: Secondary | ICD-10-CM | POA: Diagnosis not present

## 2017-02-28 DIAGNOSIS — Z992 Dependence on renal dialysis: Secondary | ICD-10-CM | POA: Diagnosis not present

## 2017-02-28 DIAGNOSIS — D631 Anemia in chronic kidney disease: Secondary | ICD-10-CM | POA: Diagnosis not present

## 2017-03-02 DIAGNOSIS — N2581 Secondary hyperparathyroidism of renal origin: Secondary | ICD-10-CM | POA: Diagnosis not present

## 2017-03-02 DIAGNOSIS — D631 Anemia in chronic kidney disease: Secondary | ICD-10-CM | POA: Diagnosis not present

## 2017-03-02 DIAGNOSIS — Z992 Dependence on renal dialysis: Secondary | ICD-10-CM | POA: Diagnosis not present

## 2017-03-02 DIAGNOSIS — D509 Iron deficiency anemia, unspecified: Secondary | ICD-10-CM | POA: Diagnosis not present

## 2017-03-02 DIAGNOSIS — N186 End stage renal disease: Secondary | ICD-10-CM | POA: Diagnosis not present

## 2017-03-03 DIAGNOSIS — N186 End stage renal disease: Secondary | ICD-10-CM | POA: Diagnosis not present

## 2017-03-03 DIAGNOSIS — Z992 Dependence on renal dialysis: Secondary | ICD-10-CM | POA: Diagnosis not present

## 2017-03-04 DIAGNOSIS — D631 Anemia in chronic kidney disease: Secondary | ICD-10-CM | POA: Diagnosis not present

## 2017-03-04 DIAGNOSIS — D509 Iron deficiency anemia, unspecified: Secondary | ICD-10-CM | POA: Diagnosis not present

## 2017-03-04 DIAGNOSIS — N2581 Secondary hyperparathyroidism of renal origin: Secondary | ICD-10-CM | POA: Diagnosis not present

## 2017-03-04 DIAGNOSIS — Z992 Dependence on renal dialysis: Secondary | ICD-10-CM | POA: Diagnosis not present

## 2017-03-04 DIAGNOSIS — N186 End stage renal disease: Secondary | ICD-10-CM | POA: Diagnosis not present

## 2017-03-07 DIAGNOSIS — N2581 Secondary hyperparathyroidism of renal origin: Secondary | ICD-10-CM | POA: Diagnosis not present

## 2017-03-07 DIAGNOSIS — Z992 Dependence on renal dialysis: Secondary | ICD-10-CM | POA: Diagnosis not present

## 2017-03-07 DIAGNOSIS — D631 Anemia in chronic kidney disease: Secondary | ICD-10-CM | POA: Diagnosis not present

## 2017-03-07 DIAGNOSIS — D509 Iron deficiency anemia, unspecified: Secondary | ICD-10-CM | POA: Diagnosis not present

## 2017-03-07 DIAGNOSIS — N186 End stage renal disease: Secondary | ICD-10-CM | POA: Diagnosis not present

## 2017-03-09 DIAGNOSIS — N186 End stage renal disease: Secondary | ICD-10-CM | POA: Diagnosis not present

## 2017-03-09 DIAGNOSIS — N2581 Secondary hyperparathyroidism of renal origin: Secondary | ICD-10-CM | POA: Diagnosis not present

## 2017-03-09 DIAGNOSIS — D509 Iron deficiency anemia, unspecified: Secondary | ICD-10-CM | POA: Diagnosis not present

## 2017-03-09 DIAGNOSIS — Z992 Dependence on renal dialysis: Secondary | ICD-10-CM | POA: Diagnosis not present

## 2017-03-09 DIAGNOSIS — D631 Anemia in chronic kidney disease: Secondary | ICD-10-CM | POA: Diagnosis not present

## 2017-03-11 DIAGNOSIS — Z992 Dependence on renal dialysis: Secondary | ICD-10-CM | POA: Diagnosis not present

## 2017-03-11 DIAGNOSIS — N186 End stage renal disease: Secondary | ICD-10-CM | POA: Diagnosis not present

## 2017-03-11 DIAGNOSIS — N2581 Secondary hyperparathyroidism of renal origin: Secondary | ICD-10-CM | POA: Diagnosis not present

## 2017-03-11 DIAGNOSIS — D509 Iron deficiency anemia, unspecified: Secondary | ICD-10-CM | POA: Diagnosis not present

## 2017-03-11 DIAGNOSIS — D631 Anemia in chronic kidney disease: Secondary | ICD-10-CM | POA: Diagnosis not present

## 2017-03-14 DIAGNOSIS — D509 Iron deficiency anemia, unspecified: Secondary | ICD-10-CM | POA: Diagnosis not present

## 2017-03-14 DIAGNOSIS — N2581 Secondary hyperparathyroidism of renal origin: Secondary | ICD-10-CM | POA: Diagnosis not present

## 2017-03-14 DIAGNOSIS — Z992 Dependence on renal dialysis: Secondary | ICD-10-CM | POA: Diagnosis not present

## 2017-03-14 DIAGNOSIS — N186 End stage renal disease: Secondary | ICD-10-CM | POA: Diagnosis not present

## 2017-03-14 DIAGNOSIS — D631 Anemia in chronic kidney disease: Secondary | ICD-10-CM | POA: Diagnosis not present

## 2017-03-16 DIAGNOSIS — N186 End stage renal disease: Secondary | ICD-10-CM | POA: Diagnosis not present

## 2017-03-16 DIAGNOSIS — N2581 Secondary hyperparathyroidism of renal origin: Secondary | ICD-10-CM | POA: Diagnosis not present

## 2017-03-16 DIAGNOSIS — Z992 Dependence on renal dialysis: Secondary | ICD-10-CM | POA: Diagnosis not present

## 2017-03-16 DIAGNOSIS — D509 Iron deficiency anemia, unspecified: Secondary | ICD-10-CM | POA: Diagnosis not present

## 2017-03-16 DIAGNOSIS — D631 Anemia in chronic kidney disease: Secondary | ICD-10-CM | POA: Diagnosis not present

## 2017-03-17 DIAGNOSIS — Z992 Dependence on renal dialysis: Secondary | ICD-10-CM | POA: Diagnosis not present

## 2017-03-17 DIAGNOSIS — D509 Iron deficiency anemia, unspecified: Secondary | ICD-10-CM | POA: Diagnosis not present

## 2017-03-17 DIAGNOSIS — N186 End stage renal disease: Secondary | ICD-10-CM | POA: Diagnosis not present

## 2017-03-17 DIAGNOSIS — N2581 Secondary hyperparathyroidism of renal origin: Secondary | ICD-10-CM | POA: Diagnosis not present

## 2017-03-17 DIAGNOSIS — D631 Anemia in chronic kidney disease: Secondary | ICD-10-CM | POA: Diagnosis not present

## 2017-03-18 ENCOUNTER — Other Ambulatory Visit: Payer: Self-pay | Admitting: Family Medicine

## 2017-03-21 DIAGNOSIS — D509 Iron deficiency anemia, unspecified: Secondary | ICD-10-CM | POA: Diagnosis not present

## 2017-03-21 DIAGNOSIS — N2581 Secondary hyperparathyroidism of renal origin: Secondary | ICD-10-CM | POA: Diagnosis not present

## 2017-03-21 DIAGNOSIS — N186 End stage renal disease: Secondary | ICD-10-CM | POA: Diagnosis not present

## 2017-03-21 DIAGNOSIS — D631 Anemia in chronic kidney disease: Secondary | ICD-10-CM | POA: Diagnosis not present

## 2017-03-21 DIAGNOSIS — Z992 Dependence on renal dialysis: Secondary | ICD-10-CM | POA: Diagnosis not present

## 2017-03-21 NOTE — Telephone Encounter (Signed)
Way overdue chronic o v, lets schedule, looks like these were ref few days ago? If not may do one ref

## 2017-03-22 NOTE — Telephone Encounter (Signed)
Called pt and transferred him to the front to schedule a check up in the next 30 days with dr Richardson Landry. Med was sent to pharm a few days ago.

## 2017-03-23 DIAGNOSIS — D631 Anemia in chronic kidney disease: Secondary | ICD-10-CM | POA: Diagnosis not present

## 2017-03-23 DIAGNOSIS — Z992 Dependence on renal dialysis: Secondary | ICD-10-CM | POA: Diagnosis not present

## 2017-03-23 DIAGNOSIS — D509 Iron deficiency anemia, unspecified: Secondary | ICD-10-CM | POA: Diagnosis not present

## 2017-03-23 DIAGNOSIS — N2581 Secondary hyperparathyroidism of renal origin: Secondary | ICD-10-CM | POA: Diagnosis not present

## 2017-03-23 DIAGNOSIS — N186 End stage renal disease: Secondary | ICD-10-CM | POA: Diagnosis not present

## 2017-03-25 DIAGNOSIS — D631 Anemia in chronic kidney disease: Secondary | ICD-10-CM | POA: Diagnosis not present

## 2017-03-25 DIAGNOSIS — Z992 Dependence on renal dialysis: Secondary | ICD-10-CM | POA: Diagnosis not present

## 2017-03-25 DIAGNOSIS — D509 Iron deficiency anemia, unspecified: Secondary | ICD-10-CM | POA: Diagnosis not present

## 2017-03-25 DIAGNOSIS — N186 End stage renal disease: Secondary | ICD-10-CM | POA: Diagnosis not present

## 2017-03-25 DIAGNOSIS — N2581 Secondary hyperparathyroidism of renal origin: Secondary | ICD-10-CM | POA: Diagnosis not present

## 2017-03-28 DIAGNOSIS — N186 End stage renal disease: Secondary | ICD-10-CM | POA: Diagnosis not present

## 2017-03-28 DIAGNOSIS — N2581 Secondary hyperparathyroidism of renal origin: Secondary | ICD-10-CM | POA: Diagnosis not present

## 2017-03-28 DIAGNOSIS — D631 Anemia in chronic kidney disease: Secondary | ICD-10-CM | POA: Diagnosis not present

## 2017-03-28 DIAGNOSIS — Z992 Dependence on renal dialysis: Secondary | ICD-10-CM | POA: Diagnosis not present

## 2017-03-28 DIAGNOSIS — D509 Iron deficiency anemia, unspecified: Secondary | ICD-10-CM | POA: Diagnosis not present

## 2017-03-30 DIAGNOSIS — D631 Anemia in chronic kidney disease: Secondary | ICD-10-CM | POA: Diagnosis not present

## 2017-03-30 DIAGNOSIS — N186 End stage renal disease: Secondary | ICD-10-CM | POA: Diagnosis not present

## 2017-03-30 DIAGNOSIS — Z992 Dependence on renal dialysis: Secondary | ICD-10-CM | POA: Diagnosis not present

## 2017-03-30 DIAGNOSIS — N2581 Secondary hyperparathyroidism of renal origin: Secondary | ICD-10-CM | POA: Diagnosis not present

## 2017-03-30 DIAGNOSIS — D509 Iron deficiency anemia, unspecified: Secondary | ICD-10-CM | POA: Diagnosis not present

## 2017-04-01 DIAGNOSIS — N2581 Secondary hyperparathyroidism of renal origin: Secondary | ICD-10-CM | POA: Diagnosis not present

## 2017-04-01 DIAGNOSIS — Z992 Dependence on renal dialysis: Secondary | ICD-10-CM | POA: Diagnosis not present

## 2017-04-01 DIAGNOSIS — N186 End stage renal disease: Secondary | ICD-10-CM | POA: Diagnosis not present

## 2017-04-01 DIAGNOSIS — D509 Iron deficiency anemia, unspecified: Secondary | ICD-10-CM | POA: Diagnosis not present

## 2017-04-01 DIAGNOSIS — D631 Anemia in chronic kidney disease: Secondary | ICD-10-CM | POA: Diagnosis not present

## 2017-04-03 DIAGNOSIS — Z992 Dependence on renal dialysis: Secondary | ICD-10-CM | POA: Diagnosis not present

## 2017-04-03 DIAGNOSIS — N186 End stage renal disease: Secondary | ICD-10-CM | POA: Diagnosis not present

## 2017-04-04 DIAGNOSIS — D631 Anemia in chronic kidney disease: Secondary | ICD-10-CM | POA: Diagnosis not present

## 2017-04-04 DIAGNOSIS — Z992 Dependence on renal dialysis: Secondary | ICD-10-CM | POA: Diagnosis not present

## 2017-04-04 DIAGNOSIS — D509 Iron deficiency anemia, unspecified: Secondary | ICD-10-CM | POA: Diagnosis not present

## 2017-04-04 DIAGNOSIS — N186 End stage renal disease: Secondary | ICD-10-CM | POA: Diagnosis not present

## 2017-04-04 DIAGNOSIS — N2581 Secondary hyperparathyroidism of renal origin: Secondary | ICD-10-CM | POA: Diagnosis not present

## 2017-04-06 DIAGNOSIS — N2581 Secondary hyperparathyroidism of renal origin: Secondary | ICD-10-CM | POA: Diagnosis not present

## 2017-04-06 DIAGNOSIS — D509 Iron deficiency anemia, unspecified: Secondary | ICD-10-CM | POA: Diagnosis not present

## 2017-04-06 DIAGNOSIS — D631 Anemia in chronic kidney disease: Secondary | ICD-10-CM | POA: Diagnosis not present

## 2017-04-06 DIAGNOSIS — N186 End stage renal disease: Secondary | ICD-10-CM | POA: Diagnosis not present

## 2017-04-06 DIAGNOSIS — Z992 Dependence on renal dialysis: Secondary | ICD-10-CM | POA: Diagnosis not present

## 2017-04-08 DIAGNOSIS — D509 Iron deficiency anemia, unspecified: Secondary | ICD-10-CM | POA: Diagnosis not present

## 2017-04-08 DIAGNOSIS — N186 End stage renal disease: Secondary | ICD-10-CM | POA: Diagnosis not present

## 2017-04-08 DIAGNOSIS — N2581 Secondary hyperparathyroidism of renal origin: Secondary | ICD-10-CM | POA: Diagnosis not present

## 2017-04-08 DIAGNOSIS — Z992 Dependence on renal dialysis: Secondary | ICD-10-CM | POA: Diagnosis not present

## 2017-04-08 DIAGNOSIS — D631 Anemia in chronic kidney disease: Secondary | ICD-10-CM | POA: Diagnosis not present

## 2017-04-11 DIAGNOSIS — N2581 Secondary hyperparathyroidism of renal origin: Secondary | ICD-10-CM | POA: Diagnosis not present

## 2017-04-11 DIAGNOSIS — D509 Iron deficiency anemia, unspecified: Secondary | ICD-10-CM | POA: Diagnosis not present

## 2017-04-11 DIAGNOSIS — D631 Anemia in chronic kidney disease: Secondary | ICD-10-CM | POA: Diagnosis not present

## 2017-04-11 DIAGNOSIS — E119 Type 2 diabetes mellitus without complications: Secondary | ICD-10-CM | POA: Diagnosis not present

## 2017-04-11 DIAGNOSIS — N186 End stage renal disease: Secondary | ICD-10-CM | POA: Diagnosis not present

## 2017-04-11 DIAGNOSIS — Z992 Dependence on renal dialysis: Secondary | ICD-10-CM | POA: Diagnosis not present

## 2017-04-11 LAB — HEMOGLOBIN A1C: Hemoglobin A1C: 7

## 2017-04-13 DIAGNOSIS — N2581 Secondary hyperparathyroidism of renal origin: Secondary | ICD-10-CM | POA: Diagnosis not present

## 2017-04-13 DIAGNOSIS — N186 End stage renal disease: Secondary | ICD-10-CM | POA: Diagnosis not present

## 2017-04-13 DIAGNOSIS — D509 Iron deficiency anemia, unspecified: Secondary | ICD-10-CM | POA: Diagnosis not present

## 2017-04-13 DIAGNOSIS — Z992 Dependence on renal dialysis: Secondary | ICD-10-CM | POA: Diagnosis not present

## 2017-04-13 DIAGNOSIS — D631 Anemia in chronic kidney disease: Secondary | ICD-10-CM | POA: Diagnosis not present

## 2017-04-14 ENCOUNTER — Ambulatory Visit (INDEPENDENT_AMBULATORY_CARE_PROVIDER_SITE_OTHER): Payer: Medicare Other | Admitting: Family Medicine

## 2017-04-14 VITALS — BP 110/72 | Ht 68.0 in | Wt 176.0 lb

## 2017-04-14 DIAGNOSIS — Z794 Long term (current) use of insulin: Secondary | ICD-10-CM | POA: Diagnosis not present

## 2017-04-14 DIAGNOSIS — E785 Hyperlipidemia, unspecified: Secondary | ICD-10-CM | POA: Diagnosis not present

## 2017-04-14 DIAGNOSIS — F3341 Major depressive disorder, recurrent, in partial remission: Secondary | ICD-10-CM | POA: Diagnosis not present

## 2017-04-14 DIAGNOSIS — I10 Essential (primary) hypertension: Secondary | ICD-10-CM | POA: Diagnosis not present

## 2017-04-14 DIAGNOSIS — E119 Type 2 diabetes mellitus without complications: Secondary | ICD-10-CM | POA: Diagnosis not present

## 2017-04-14 MED ORDER — INSULIN NPH (HUMAN) (ISOPHANE) 100 UNIT/ML ~~LOC~~ SUSP: SUBCUTANEOUS | 5 refills | Status: DC

## 2017-04-14 MED ORDER — LORATADINE 10 MG PO TABS: 10.0000 mg | ORAL_TABLET | Freq: Every day | ORAL | 1 refills | Status: DC

## 2017-04-14 MED ORDER — CITALOPRAM HYDROBROMIDE 20 MG PO TABS: ORAL_TABLET | ORAL | 1 refills | Status: DC

## 2017-04-14 NOTE — Progress Notes (Signed)
   Subjective:    Patient ID: Paul Gonzalez, male    DOB: 1965/10/21, 52 y.o.   MRN: [id number]   Diabetes   He presents for his follow-up diabetic visit. He has type 2 diabetes mellitus. He is compliant with treatment all of the time. Home blood sugar record trend: 200 - 300.  He does not see a podiatrist. Eye exam is not current.   A1C done this week at dialysis center. 7.07. Pt brought in copy of bloodwork results.   Pt states no concerns today.   Patient claims compliance with diabetes medication. No obvious side effects. Reports no substantial low sugar spells. Most numbers are generally in good range when checked fasting. Generally does not miss a dose of medication. Watching diabetic diet closely  Blood pressure medicine and blood pressure levels reviewed today with patient. Compliant with blood pressure medicine. States does not miss a dose. No obvious side effects. Blood pressure generally good when checked elsewhere. Watching salt intake. Currently off medication for blood pressure level still.good  Patient notes ongoing compliance with antidepressant medication. No obvious side effects. Reports does not miss a dose. Overall continues to help depression substantially. No thoughts of homicide or suicide. Would like to maintain medication.       Review of Systems No headache, no major weight loss or weight gain, no chest pain no back pain abdominal pain no change in bowel habits complete ROS otherwise negative     Objective:   Physical Exam  Alert and oriented, vitals reviewed and stable, NAD ENT-TM's and ext canals WNL bilat via otoscopic exam Soft palate, tonsils and post pharynx WNL via oropharyngeal exam Neck-symmetric, no masses; thyroid nonpalpable and nontender Pulmonary-no tachypnea or accessory muscle use; Clear without wheezes via auscultation Card--no abnrml murmurs, rhythm reg and rate WNL Carotid pulses symmetric, without bruits       Assessment & Plan:   Impression #1 type 2 diabetes.  A1c 7.0 via blood work from nephrologist.  Numbers overall good will maintain same therapy  2.  Hypertension controlled with diet and exercise 1 no medications numbers good  3.  Depression clinically stable discussed to maintain same medicine.  4.  Ophthalmology visit.  Patient's wife states since he is legally blind they have stopped going to Dr., I told him this was a he needs he can still recognize shapes and figures and needs to retain what of her vision and possibly

## 2017-04-15 DIAGNOSIS — D631 Anemia in chronic kidney disease: Secondary | ICD-10-CM | POA: Diagnosis not present

## 2017-04-15 DIAGNOSIS — N2581 Secondary hyperparathyroidism of renal origin: Secondary | ICD-10-CM | POA: Diagnosis not present

## 2017-04-15 DIAGNOSIS — Z992 Dependence on renal dialysis: Secondary | ICD-10-CM | POA: Diagnosis not present

## 2017-04-15 DIAGNOSIS — N186 End stage renal disease: Secondary | ICD-10-CM | POA: Diagnosis not present

## 2017-04-15 DIAGNOSIS — D509 Iron deficiency anemia, unspecified: Secondary | ICD-10-CM | POA: Diagnosis not present

## 2017-04-18 DIAGNOSIS — N186 End stage renal disease: Secondary | ICD-10-CM | POA: Diagnosis not present

## 2017-04-18 DIAGNOSIS — D509 Iron deficiency anemia, unspecified: Secondary | ICD-10-CM | POA: Diagnosis not present

## 2017-04-18 DIAGNOSIS — Z992 Dependence on renal dialysis: Secondary | ICD-10-CM | POA: Diagnosis not present

## 2017-04-18 DIAGNOSIS — D631 Anemia in chronic kidney disease: Secondary | ICD-10-CM | POA: Diagnosis not present

## 2017-04-18 DIAGNOSIS — N2581 Secondary hyperparathyroidism of renal origin: Secondary | ICD-10-CM | POA: Diagnosis not present

## 2017-04-20 DIAGNOSIS — D631 Anemia in chronic kidney disease: Secondary | ICD-10-CM | POA: Diagnosis not present

## 2017-04-20 DIAGNOSIS — Z992 Dependence on renal dialysis: Secondary | ICD-10-CM | POA: Diagnosis not present

## 2017-04-20 DIAGNOSIS — N186 End stage renal disease: Secondary | ICD-10-CM | POA: Diagnosis not present

## 2017-04-20 DIAGNOSIS — D509 Iron deficiency anemia, unspecified: Secondary | ICD-10-CM | POA: Diagnosis not present

## 2017-04-20 DIAGNOSIS — N2581 Secondary hyperparathyroidism of renal origin: Secondary | ICD-10-CM | POA: Diagnosis not present

## 2017-04-22 DIAGNOSIS — Z992 Dependence on renal dialysis: Secondary | ICD-10-CM | POA: Diagnosis not present

## 2017-04-22 DIAGNOSIS — D509 Iron deficiency anemia, unspecified: Secondary | ICD-10-CM | POA: Diagnosis not present

## 2017-04-22 DIAGNOSIS — N186 End stage renal disease: Secondary | ICD-10-CM | POA: Diagnosis not present

## 2017-04-22 DIAGNOSIS — N2581 Secondary hyperparathyroidism of renal origin: Secondary | ICD-10-CM | POA: Diagnosis not present

## 2017-04-22 DIAGNOSIS — D631 Anemia in chronic kidney disease: Secondary | ICD-10-CM | POA: Diagnosis not present

## 2017-04-25 DIAGNOSIS — Z992 Dependence on renal dialysis: Secondary | ICD-10-CM | POA: Diagnosis not present

## 2017-04-25 DIAGNOSIS — N186 End stage renal disease: Secondary | ICD-10-CM | POA: Diagnosis not present

## 2017-04-25 DIAGNOSIS — N2581 Secondary hyperparathyroidism of renal origin: Secondary | ICD-10-CM | POA: Diagnosis not present

## 2017-04-25 DIAGNOSIS — D509 Iron deficiency anemia, unspecified: Secondary | ICD-10-CM | POA: Diagnosis not present

## 2017-04-25 DIAGNOSIS — D631 Anemia in chronic kidney disease: Secondary | ICD-10-CM | POA: Diagnosis not present

## 2017-04-27 DIAGNOSIS — D631 Anemia in chronic kidney disease: Secondary | ICD-10-CM | POA: Diagnosis not present

## 2017-04-27 DIAGNOSIS — Z992 Dependence on renal dialysis: Secondary | ICD-10-CM | POA: Diagnosis not present

## 2017-04-27 DIAGNOSIS — N186 End stage renal disease: Secondary | ICD-10-CM | POA: Diagnosis not present

## 2017-04-27 DIAGNOSIS — D509 Iron deficiency anemia, unspecified: Secondary | ICD-10-CM | POA: Diagnosis not present

## 2017-04-27 DIAGNOSIS — N2581 Secondary hyperparathyroidism of renal origin: Secondary | ICD-10-CM | POA: Diagnosis not present

## 2017-04-29 DIAGNOSIS — N2581 Secondary hyperparathyroidism of renal origin: Secondary | ICD-10-CM | POA: Diagnosis not present

## 2017-04-29 DIAGNOSIS — Z992 Dependence on renal dialysis: Secondary | ICD-10-CM | POA: Diagnosis not present

## 2017-04-29 DIAGNOSIS — D631 Anemia in chronic kidney disease: Secondary | ICD-10-CM | POA: Diagnosis not present

## 2017-04-29 DIAGNOSIS — D509 Iron deficiency anemia, unspecified: Secondary | ICD-10-CM | POA: Diagnosis not present

## 2017-04-29 DIAGNOSIS — N186 End stage renal disease: Secondary | ICD-10-CM | POA: Diagnosis not present

## 2017-05-02 DIAGNOSIS — D631 Anemia in chronic kidney disease: Secondary | ICD-10-CM | POA: Diagnosis not present

## 2017-05-02 DIAGNOSIS — D509 Iron deficiency anemia, unspecified: Secondary | ICD-10-CM | POA: Diagnosis not present

## 2017-05-02 DIAGNOSIS — N2581 Secondary hyperparathyroidism of renal origin: Secondary | ICD-10-CM | POA: Diagnosis not present

## 2017-05-02 DIAGNOSIS — N186 End stage renal disease: Secondary | ICD-10-CM | POA: Diagnosis not present

## 2017-05-02 DIAGNOSIS — Z992 Dependence on renal dialysis: Secondary | ICD-10-CM | POA: Diagnosis not present

## 2017-05-03 DIAGNOSIS — Z992 Dependence on renal dialysis: Secondary | ICD-10-CM | POA: Diagnosis not present

## 2017-05-03 DIAGNOSIS — N186 End stage renal disease: Secondary | ICD-10-CM | POA: Diagnosis not present

## 2017-05-04 DIAGNOSIS — Z992 Dependence on renal dialysis: Secondary | ICD-10-CM | POA: Diagnosis not present

## 2017-05-04 DIAGNOSIS — N186 End stage renal disease: Secondary | ICD-10-CM | POA: Diagnosis not present

## 2017-05-04 DIAGNOSIS — N2581 Secondary hyperparathyroidism of renal origin: Secondary | ICD-10-CM | POA: Diagnosis not present

## 2017-05-04 DIAGNOSIS — D631 Anemia in chronic kidney disease: Secondary | ICD-10-CM | POA: Diagnosis not present

## 2017-05-04 DIAGNOSIS — D509 Iron deficiency anemia, unspecified: Secondary | ICD-10-CM | POA: Diagnosis not present

## 2017-05-06 DIAGNOSIS — D509 Iron deficiency anemia, unspecified: Secondary | ICD-10-CM | POA: Diagnosis not present

## 2017-05-06 DIAGNOSIS — D631 Anemia in chronic kidney disease: Secondary | ICD-10-CM | POA: Diagnosis not present

## 2017-05-06 DIAGNOSIS — N2581 Secondary hyperparathyroidism of renal origin: Secondary | ICD-10-CM | POA: Diagnosis not present

## 2017-05-06 DIAGNOSIS — N186 End stage renal disease: Secondary | ICD-10-CM | POA: Diagnosis not present

## 2017-05-06 DIAGNOSIS — Z992 Dependence on renal dialysis: Secondary | ICD-10-CM | POA: Diagnosis not present

## 2017-05-09 DIAGNOSIS — D631 Anemia in chronic kidney disease: Secondary | ICD-10-CM | POA: Diagnosis not present

## 2017-05-09 DIAGNOSIS — Z992 Dependence on renal dialysis: Secondary | ICD-10-CM | POA: Diagnosis not present

## 2017-05-09 DIAGNOSIS — N186 End stage renal disease: Secondary | ICD-10-CM | POA: Diagnosis not present

## 2017-05-09 DIAGNOSIS — D509 Iron deficiency anemia, unspecified: Secondary | ICD-10-CM | POA: Diagnosis not present

## 2017-05-09 DIAGNOSIS — N2581 Secondary hyperparathyroidism of renal origin: Secondary | ICD-10-CM | POA: Diagnosis not present

## 2017-05-11 DIAGNOSIS — N2581 Secondary hyperparathyroidism of renal origin: Secondary | ICD-10-CM | POA: Diagnosis not present

## 2017-05-11 DIAGNOSIS — Z992 Dependence on renal dialysis: Secondary | ICD-10-CM | POA: Diagnosis not present

## 2017-05-11 DIAGNOSIS — N186 End stage renal disease: Secondary | ICD-10-CM | POA: Diagnosis not present

## 2017-05-11 DIAGNOSIS — D631 Anemia in chronic kidney disease: Secondary | ICD-10-CM | POA: Diagnosis not present

## 2017-05-11 DIAGNOSIS — D509 Iron deficiency anemia, unspecified: Secondary | ICD-10-CM | POA: Diagnosis not present

## 2017-05-13 DIAGNOSIS — Z992 Dependence on renal dialysis: Secondary | ICD-10-CM | POA: Diagnosis not present

## 2017-05-13 DIAGNOSIS — N2581 Secondary hyperparathyroidism of renal origin: Secondary | ICD-10-CM | POA: Diagnosis not present

## 2017-05-13 DIAGNOSIS — D509 Iron deficiency anemia, unspecified: Secondary | ICD-10-CM | POA: Diagnosis not present

## 2017-05-13 DIAGNOSIS — N186 End stage renal disease: Secondary | ICD-10-CM | POA: Diagnosis not present

## 2017-05-13 DIAGNOSIS — D631 Anemia in chronic kidney disease: Secondary | ICD-10-CM | POA: Diagnosis not present

## 2017-05-16 DIAGNOSIS — N2581 Secondary hyperparathyroidism of renal origin: Secondary | ICD-10-CM | POA: Diagnosis not present

## 2017-05-16 DIAGNOSIS — D509 Iron deficiency anemia, unspecified: Secondary | ICD-10-CM | POA: Diagnosis not present

## 2017-05-16 DIAGNOSIS — N186 End stage renal disease: Secondary | ICD-10-CM | POA: Diagnosis not present

## 2017-05-16 DIAGNOSIS — D631 Anemia in chronic kidney disease: Secondary | ICD-10-CM | POA: Diagnosis not present

## 2017-05-16 DIAGNOSIS — Z992 Dependence on renal dialysis: Secondary | ICD-10-CM | POA: Diagnosis not present

## 2017-05-18 DIAGNOSIS — Z992 Dependence on renal dialysis: Secondary | ICD-10-CM | POA: Diagnosis not present

## 2017-05-18 DIAGNOSIS — D509 Iron deficiency anemia, unspecified: Secondary | ICD-10-CM | POA: Diagnosis not present

## 2017-05-18 DIAGNOSIS — D631 Anemia in chronic kidney disease: Secondary | ICD-10-CM | POA: Diagnosis not present

## 2017-05-18 DIAGNOSIS — N186 End stage renal disease: Secondary | ICD-10-CM | POA: Diagnosis not present

## 2017-05-18 DIAGNOSIS — N2581 Secondary hyperparathyroidism of renal origin: Secondary | ICD-10-CM | POA: Diagnosis not present

## 2017-05-20 DIAGNOSIS — N2581 Secondary hyperparathyroidism of renal origin: Secondary | ICD-10-CM | POA: Diagnosis not present

## 2017-05-20 DIAGNOSIS — N186 End stage renal disease: Secondary | ICD-10-CM | POA: Diagnosis not present

## 2017-05-20 DIAGNOSIS — D631 Anemia in chronic kidney disease: Secondary | ICD-10-CM | POA: Diagnosis not present

## 2017-05-20 DIAGNOSIS — Z992 Dependence on renal dialysis: Secondary | ICD-10-CM | POA: Diagnosis not present

## 2017-05-20 DIAGNOSIS — D509 Iron deficiency anemia, unspecified: Secondary | ICD-10-CM | POA: Diagnosis not present

## 2017-05-23 DIAGNOSIS — N2581 Secondary hyperparathyroidism of renal origin: Secondary | ICD-10-CM | POA: Diagnosis not present

## 2017-05-23 DIAGNOSIS — D509 Iron deficiency anemia, unspecified: Secondary | ICD-10-CM | POA: Diagnosis not present

## 2017-05-23 DIAGNOSIS — N186 End stage renal disease: Secondary | ICD-10-CM | POA: Diagnosis not present

## 2017-05-23 DIAGNOSIS — D631 Anemia in chronic kidney disease: Secondary | ICD-10-CM | POA: Diagnosis not present

## 2017-05-23 DIAGNOSIS — Z992 Dependence on renal dialysis: Secondary | ICD-10-CM | POA: Diagnosis not present

## 2017-05-25 DIAGNOSIS — N186 End stage renal disease: Secondary | ICD-10-CM | POA: Diagnosis not present

## 2017-05-25 DIAGNOSIS — D509 Iron deficiency anemia, unspecified: Secondary | ICD-10-CM | POA: Diagnosis not present

## 2017-05-25 DIAGNOSIS — Z992 Dependence on renal dialysis: Secondary | ICD-10-CM | POA: Diagnosis not present

## 2017-05-25 DIAGNOSIS — N2581 Secondary hyperparathyroidism of renal origin: Secondary | ICD-10-CM | POA: Diagnosis not present

## 2017-05-25 DIAGNOSIS — D631 Anemia in chronic kidney disease: Secondary | ICD-10-CM | POA: Diagnosis not present

## 2017-05-27 DIAGNOSIS — N186 End stage renal disease: Secondary | ICD-10-CM | POA: Diagnosis not present

## 2017-05-27 DIAGNOSIS — D509 Iron deficiency anemia, unspecified: Secondary | ICD-10-CM | POA: Diagnosis not present

## 2017-05-27 DIAGNOSIS — N2581 Secondary hyperparathyroidism of renal origin: Secondary | ICD-10-CM | POA: Diagnosis not present

## 2017-05-27 DIAGNOSIS — Z992 Dependence on renal dialysis: Secondary | ICD-10-CM | POA: Diagnosis not present

## 2017-05-27 DIAGNOSIS — D631 Anemia in chronic kidney disease: Secondary | ICD-10-CM | POA: Diagnosis not present

## 2017-05-30 DIAGNOSIS — N2581 Secondary hyperparathyroidism of renal origin: Secondary | ICD-10-CM | POA: Diagnosis not present

## 2017-05-30 DIAGNOSIS — D509 Iron deficiency anemia, unspecified: Secondary | ICD-10-CM | POA: Diagnosis not present

## 2017-05-30 DIAGNOSIS — Z992 Dependence on renal dialysis: Secondary | ICD-10-CM | POA: Diagnosis not present

## 2017-05-30 DIAGNOSIS — N186 End stage renal disease: Secondary | ICD-10-CM | POA: Diagnosis not present

## 2017-05-30 DIAGNOSIS — D631 Anemia in chronic kidney disease: Secondary | ICD-10-CM | POA: Diagnosis not present

## 2017-06-01 DIAGNOSIS — Z992 Dependence on renal dialysis: Secondary | ICD-10-CM | POA: Diagnosis not present

## 2017-06-01 DIAGNOSIS — N2581 Secondary hyperparathyroidism of renal origin: Secondary | ICD-10-CM | POA: Diagnosis not present

## 2017-06-01 DIAGNOSIS — D509 Iron deficiency anemia, unspecified: Secondary | ICD-10-CM | POA: Diagnosis not present

## 2017-06-01 DIAGNOSIS — N186 End stage renal disease: Secondary | ICD-10-CM | POA: Diagnosis not present

## 2017-06-01 DIAGNOSIS — D631 Anemia in chronic kidney disease: Secondary | ICD-10-CM | POA: Diagnosis not present

## 2017-06-03 DIAGNOSIS — Z992 Dependence on renal dialysis: Secondary | ICD-10-CM | POA: Diagnosis not present

## 2017-06-03 DIAGNOSIS — N186 End stage renal disease: Secondary | ICD-10-CM | POA: Diagnosis not present

## 2017-06-03 DIAGNOSIS — N2581 Secondary hyperparathyroidism of renal origin: Secondary | ICD-10-CM | POA: Diagnosis not present

## 2017-06-03 DIAGNOSIS — D631 Anemia in chronic kidney disease: Secondary | ICD-10-CM | POA: Diagnosis not present

## 2017-06-03 DIAGNOSIS — D509 Iron deficiency anemia, unspecified: Secondary | ICD-10-CM | POA: Diagnosis not present

## 2017-06-06 DIAGNOSIS — D509 Iron deficiency anemia, unspecified: Secondary | ICD-10-CM | POA: Diagnosis not present

## 2017-06-06 DIAGNOSIS — Z992 Dependence on renal dialysis: Secondary | ICD-10-CM | POA: Diagnosis not present

## 2017-06-06 DIAGNOSIS — N186 End stage renal disease: Secondary | ICD-10-CM | POA: Diagnosis not present

## 2017-06-06 DIAGNOSIS — D631 Anemia in chronic kidney disease: Secondary | ICD-10-CM | POA: Diagnosis not present

## 2017-06-06 DIAGNOSIS — N2581 Secondary hyperparathyroidism of renal origin: Secondary | ICD-10-CM | POA: Diagnosis not present

## 2017-06-08 DIAGNOSIS — N186 End stage renal disease: Secondary | ICD-10-CM | POA: Diagnosis not present

## 2017-06-08 DIAGNOSIS — N2581 Secondary hyperparathyroidism of renal origin: Secondary | ICD-10-CM | POA: Diagnosis not present

## 2017-06-08 DIAGNOSIS — Z992 Dependence on renal dialysis: Secondary | ICD-10-CM | POA: Diagnosis not present

## 2017-06-08 DIAGNOSIS — D631 Anemia in chronic kidney disease: Secondary | ICD-10-CM | POA: Diagnosis not present

## 2017-06-08 DIAGNOSIS — D509 Iron deficiency anemia, unspecified: Secondary | ICD-10-CM | POA: Diagnosis not present

## 2017-06-10 DIAGNOSIS — Z992 Dependence on renal dialysis: Secondary | ICD-10-CM | POA: Diagnosis not present

## 2017-06-10 DIAGNOSIS — N2581 Secondary hyperparathyroidism of renal origin: Secondary | ICD-10-CM | POA: Diagnosis not present

## 2017-06-10 DIAGNOSIS — D509 Iron deficiency anemia, unspecified: Secondary | ICD-10-CM | POA: Diagnosis not present

## 2017-06-10 DIAGNOSIS — N186 End stage renal disease: Secondary | ICD-10-CM | POA: Diagnosis not present

## 2017-06-10 DIAGNOSIS — D631 Anemia in chronic kidney disease: Secondary | ICD-10-CM | POA: Diagnosis not present

## 2017-06-13 DIAGNOSIS — D631 Anemia in chronic kidney disease: Secondary | ICD-10-CM | POA: Diagnosis not present

## 2017-06-13 DIAGNOSIS — Z992 Dependence on renal dialysis: Secondary | ICD-10-CM | POA: Diagnosis not present

## 2017-06-13 DIAGNOSIS — N2581 Secondary hyperparathyroidism of renal origin: Secondary | ICD-10-CM | POA: Diagnosis not present

## 2017-06-13 DIAGNOSIS — N186 End stage renal disease: Secondary | ICD-10-CM | POA: Diagnosis not present

## 2017-06-13 DIAGNOSIS — D509 Iron deficiency anemia, unspecified: Secondary | ICD-10-CM | POA: Diagnosis not present

## 2017-06-15 DIAGNOSIS — N2581 Secondary hyperparathyroidism of renal origin: Secondary | ICD-10-CM | POA: Diagnosis not present

## 2017-06-15 DIAGNOSIS — D509 Iron deficiency anemia, unspecified: Secondary | ICD-10-CM | POA: Diagnosis not present

## 2017-06-15 DIAGNOSIS — Z992 Dependence on renal dialysis: Secondary | ICD-10-CM | POA: Diagnosis not present

## 2017-06-15 DIAGNOSIS — N186 End stage renal disease: Secondary | ICD-10-CM | POA: Diagnosis not present

## 2017-06-15 DIAGNOSIS — D631 Anemia in chronic kidney disease: Secondary | ICD-10-CM | POA: Diagnosis not present

## 2017-06-17 DIAGNOSIS — D631 Anemia in chronic kidney disease: Secondary | ICD-10-CM | POA: Diagnosis not present

## 2017-06-17 DIAGNOSIS — Z992 Dependence on renal dialysis: Secondary | ICD-10-CM | POA: Diagnosis not present

## 2017-06-17 DIAGNOSIS — D509 Iron deficiency anemia, unspecified: Secondary | ICD-10-CM | POA: Diagnosis not present

## 2017-06-17 DIAGNOSIS — N186 End stage renal disease: Secondary | ICD-10-CM | POA: Diagnosis not present

## 2017-06-17 DIAGNOSIS — N2581 Secondary hyperparathyroidism of renal origin: Secondary | ICD-10-CM | POA: Diagnosis not present

## 2017-06-20 ENCOUNTER — Encounter: Payer: Self-pay | Admitting: *Deleted

## 2017-06-20 ENCOUNTER — Other Ambulatory Visit: Payer: Self-pay | Admitting: *Deleted

## 2017-06-20 ENCOUNTER — Telehealth: Payer: Self-pay | Admitting: *Deleted

## 2017-06-20 DIAGNOSIS — Z992 Dependence on renal dialysis: Secondary | ICD-10-CM | POA: Diagnosis not present

## 2017-06-20 DIAGNOSIS — D631 Anemia in chronic kidney disease: Secondary | ICD-10-CM | POA: Diagnosis not present

## 2017-06-20 DIAGNOSIS — N2581 Secondary hyperparathyroidism of renal origin: Secondary | ICD-10-CM | POA: Diagnosis not present

## 2017-06-20 DIAGNOSIS — D509 Iron deficiency anemia, unspecified: Secondary | ICD-10-CM | POA: Diagnosis not present

## 2017-06-20 DIAGNOSIS — N186 End stage renal disease: Secondary | ICD-10-CM | POA: Diagnosis not present

## 2017-06-20 NOTE — Telephone Encounter (Signed)
Call to [facility] at [facility] to review pre-procedure instructions and insulin adjustment guidelines with patient for procedure on 06/28/17 to be at [facility] admitting at [place]. All instructions faxed to [facility].

## 2017-06-20 NOTE — Progress Notes (Signed)
Request for fistulogram per Dr. Lowanda Foster due to "difficult cannulation and swelling after treatment" Left arm Fistula.

## 2017-06-22 DIAGNOSIS — N2581 Secondary hyperparathyroidism of renal origin: Secondary | ICD-10-CM | POA: Diagnosis not present

## 2017-06-22 DIAGNOSIS — D509 Iron deficiency anemia, unspecified: Secondary | ICD-10-CM | POA: Diagnosis not present

## 2017-06-22 DIAGNOSIS — N186 End stage renal disease: Secondary | ICD-10-CM | POA: Diagnosis not present

## 2017-06-22 DIAGNOSIS — D631 Anemia in chronic kidney disease: Secondary | ICD-10-CM | POA: Diagnosis not present

## 2017-06-22 DIAGNOSIS — Z992 Dependence on renal dialysis: Secondary | ICD-10-CM | POA: Diagnosis not present

## 2017-06-24 DIAGNOSIS — Z992 Dependence on renal dialysis: Secondary | ICD-10-CM | POA: Diagnosis not present

## 2017-06-24 DIAGNOSIS — N186 End stage renal disease: Secondary | ICD-10-CM | POA: Diagnosis not present

## 2017-06-24 DIAGNOSIS — D509 Iron deficiency anemia, unspecified: Secondary | ICD-10-CM | POA: Diagnosis not present

## 2017-06-24 DIAGNOSIS — D631 Anemia in chronic kidney disease: Secondary | ICD-10-CM | POA: Diagnosis not present

## 2017-06-24 DIAGNOSIS — N2581 Secondary hyperparathyroidism of renal origin: Secondary | ICD-10-CM | POA: Diagnosis not present

## 2017-06-27 DIAGNOSIS — D509 Iron deficiency anemia, unspecified: Secondary | ICD-10-CM | POA: Diagnosis not present

## 2017-06-27 DIAGNOSIS — D631 Anemia in chronic kidney disease: Secondary | ICD-10-CM | POA: Diagnosis not present

## 2017-06-27 DIAGNOSIS — N186 End stage renal disease: Secondary | ICD-10-CM | POA: Diagnosis not present

## 2017-06-27 DIAGNOSIS — N2581 Secondary hyperparathyroidism of renal origin: Secondary | ICD-10-CM | POA: Diagnosis not present

## 2017-06-27 DIAGNOSIS — Z992 Dependence on renal dialysis: Secondary | ICD-10-CM | POA: Diagnosis not present

## 2017-06-28 ENCOUNTER — Ambulatory Visit (HOSPITAL_COMMUNITY)
Admission: RE | Admit: 2017-06-28 | Discharge: 2017-06-28 | Disposition: A | Discharge status: Home / Self Care | Payer: Medicare Other | Source: Ambulatory Visit | Attending: Surgery | Admitting: Surgery

## 2017-06-28 ENCOUNTER — Encounter (HOSPITAL_COMMUNITY)
Admission: RE | Disposition: A | Discharge status: Home / Self Care | Payer: Self-pay | Source: Ambulatory Visit | Attending: Surgery

## 2017-06-28 DIAGNOSIS — N186 End stage renal disease: Secondary | ICD-10-CM | POA: Diagnosis not present

## 2017-06-28 DIAGNOSIS — Z4901 Encounter for fitting and adjustment of extracorporeal dialysis catheter: Secondary | ICD-10-CM | POA: Insufficient documentation

## 2017-06-28 DIAGNOSIS — T82898A Other specified complication of vascular prosthetic devices, implants and grafts, initial encounter: Secondary | ICD-10-CM | POA: Diagnosis not present

## 2017-06-28 DIAGNOSIS — N185 Chronic kidney disease, stage 5: Secondary | ICD-10-CM | POA: Diagnosis not present

## 2017-06-28 HISTORY — PX: A/V FISTULAGRAM: CATH118298

## 2017-06-28 LAB — POCT I-STAT, CHEM 8
BUN: 35 mg/dL — AB (ref 6–20)
CALCIUM ION: 1.11 mmol/L — AB (ref 1.15–1.40)
Chloride: 101 mmol/L (ref 98–111)
Creatinine, Ser: 4.6 mg/dL — ABNORMAL HIGH (ref 0.61–1.24)
Glucose, Bld: 104 mg/dL — ABNORMAL HIGH (ref 70–99)
HEMATOCRIT: 35 % — AB (ref 39.0–52.0)
HEMOGLOBIN: 11.9 g/dL — AB (ref 13.0–17.0)
Potassium: 4.4 mmol/L (ref 3.5–5.1)
SODIUM: 141 mmol/L (ref 135–145)
TCO2: 33 mmol/L — AB (ref 22–32)

## 2017-06-28 LAB — GLUCOSE, CAPILLARY: GLUCOSE-CAPILLARY: 104 mg/dL — AB (ref 70–99)

## 2017-06-28 SURGERY — A/V FISTULAGRAM
Anesthesia: LOCAL

## 2017-06-28 MED ORDER — IODIXANOL 320 MG/ML IV SOLN
INTRAVENOUS | Status: DC | PRN
  Administered 2017-06-28: 20 mL via INTRAVENOUS

## 2017-06-28 MED ORDER — LIDOCAINE HCL (PF) 1 % IJ SOLN
INTRAMUSCULAR | Status: DC | PRN
  Administered 2017-06-28: 5 mL

## 2017-06-28 MED ORDER — MIDAZOLAM HCL 2 MG/2ML IJ SOLN
INTRAMUSCULAR | Status: DC | PRN
  Administered 2017-06-28: 1 mg via INTRAVENOUS

## 2017-06-28 MED ORDER — FENTANYL CITRATE (PF) 100 MCG/2ML IJ SOLN
INTRAMUSCULAR | Status: AC
  Filled 2017-06-28: qty 2

## 2017-06-28 MED ORDER — MIDAZOLAM HCL 2 MG/2ML IJ SOLN
INTRAMUSCULAR | Status: AC
  Filled 2017-06-28: qty 2

## 2017-06-28 MED ORDER — HEPARIN (PORCINE) IN NACL 2-0.9 UNITS/ML
INTRAMUSCULAR | Status: AC | PRN
  Administered 2017-06-28: 500 mL

## 2017-06-28 MED ORDER — HEPARIN (PORCINE) IN NACL 1000-0.9 UT/500ML-% IV SOLN
INTRAVENOUS | Status: AC
  Filled 2017-06-28: qty 1000

## 2017-06-28 MED ORDER — SODIUM CHLORIDE 0.9% FLUSH: 3.0000 mL | INTRAVENOUS | Status: DC | PRN

## 2017-06-28 MED ORDER — LIDOCAINE HCL (PF) 1 % IJ SOLN
INTRAMUSCULAR | Status: AC
  Filled 2017-06-28: qty 30

## 2017-06-28 MED ORDER — FENTANYL CITRATE (PF) 100 MCG/2ML IJ SOLN
INTRAMUSCULAR | Status: DC | PRN
  Administered 2017-06-28: 25 ug via INTRAVENOUS

## 2017-06-28 SURGICAL SUPPLY — 10 items
BAG SNAP BAND KOVER 36X36 (MISCELLANEOUS) ×2 IMPLANT
COVER DOME SNAP 22 D (MISCELLANEOUS) ×2 IMPLANT
COVER PRB 48X5XTLSCP FOLD TPE (BAG) ×1 IMPLANT
COVER PROBE 5X48 (BAG) ×1
KIT MICROPUNCTURE NIT STIFF (SHEATH) ×2 IMPLANT
PROTECTION STATION PRESSURIZED (MISCELLANEOUS) ×2
STATION PROTECTION PRESSURIZED (MISCELLANEOUS) ×1 IMPLANT
STOPCOCK MORSE 400PSI 3WAY (MISCELLANEOUS) ×2 IMPLANT
TRAY PV CATH (CUSTOM PROCEDURE TRAY) ×2 IMPLANT
TUBING CIL FLEX 10 FLL-RA (TUBING) ×2 IMPLANT

## 2017-06-28 NOTE — Discharge Instructions (Signed)

## 2017-06-28 NOTE — H&P (Signed)
   Patient name: Paul Gonzalez MRN: [id number] DOB: 1965/08/10 Sex: male    HISTORY OF PRESENT ILLNESS:   Paul Gonzalez is a 52 y.o. male with difficulty accessing his Left RCF  CURRENT MEDICATIONS:    Current Facility-Administered Medications  Medication Dose Route Frequency Provider Last Rate Last Dose  . sodium chloride flush (NS) 0.9 % injection 3 mL  3 mL Intravenous PRN Serafina Mitchell, MD        REVIEW OF SYSTEMS:   [X]  denotes positive finding, [ ]  denotes negative finding Cardiac  Comments:  Chest pain or chest pressure:    Shortness of breath upon exertion:    Short of breath when lying flat:    Irregular heart rhythm:    Constitutional    Fever or chills:      PHYSICAL EXAM:   Vitals:   06/28/17 0652  BP: (!) 143/77  Pulse: 67  Resp: 18  Temp: 98.2 F (36.8 C)  TempSrc: Oral  SpO2: 100%  Weight: 176 lb (79.8 kg)  Height: 5\' 10"  (1.778 m)    GENERAL: The patient is a well-nourished male, in no acute distress. The vital signs are documented above. CARDIOVASCULAR: There is a regular rate and rhythm. PULMONARY: Non-labored respiration Good thrill in avf  STUDIES:   none   MEDICAL ISSUES:   Needs fistulaogram for further evaluation and possible intervention  Annamarie Major, MD [facility] [phone] Pager [phone]

## 2017-06-28 NOTE — Op Note (Signed)
    Patient name: Paul Gonzalez MRN: [id number] DOB: [birth year] Sex: male  06/28/2017 Pre-operative Diagnosis: ESRD Post-operative diagnosis:  Same Surgeon:  Annamarie Major Procedure Performed:  1.  U/s guided access, left arm fistula  2.  fistulogram  3.  Sedation (34minutes)   Indications:  Patient is having trouble with access.  Procedure:  The patient was identified in the holding area and taken to room 8.  The patient was then placed supine on the table and prepped and draped in the usual sterile fashion.  A time out was called.  Conscious sedation was given with Fentanyl and versed.  HR, BP, and O2 satds were continuously monitored by my self an the circulating nurse.Ultrasound was used to evaluate the fistula.  The vein was patent and compressible.  A digital ultrasound image was acquired.  The fistula was then accessed under ultrasound guidance using a micropuncture needle.  An 018 wire was then asvanced without resistance and a micropuncture sheath was placed.  Contrast injections were then performed through the sheath.  Findings:  Widely patent central venous system.  Arterial anastamosis is widely patent.  No stneosis within the fistula   Intervention:  none  Impression:  #1  Normal fistulogram    V. Annamarie Major, M.D. [facility] Office: [phone] Pager:  [phone]

## 2017-06-29 ENCOUNTER — Encounter (HOSPITAL_COMMUNITY): Payer: Self-pay | Admitting: Surgery

## 2017-06-29 DIAGNOSIS — D509 Iron deficiency anemia, unspecified: Secondary | ICD-10-CM | POA: Diagnosis not present

## 2017-06-29 DIAGNOSIS — D631 Anemia in chronic kidney disease: Secondary | ICD-10-CM | POA: Diagnosis not present

## 2017-06-29 DIAGNOSIS — N186 End stage renal disease: Secondary | ICD-10-CM | POA: Diagnosis not present

## 2017-06-29 DIAGNOSIS — N2581 Secondary hyperparathyroidism of renal origin: Secondary | ICD-10-CM | POA: Diagnosis not present

## 2017-06-29 DIAGNOSIS — Z992 Dependence on renal dialysis: Secondary | ICD-10-CM | POA: Diagnosis not present

## 2017-07-01 DIAGNOSIS — D509 Iron deficiency anemia, unspecified: Secondary | ICD-10-CM | POA: Diagnosis not present

## 2017-07-01 DIAGNOSIS — D631 Anemia in chronic kidney disease: Secondary | ICD-10-CM | POA: Diagnosis not present

## 2017-07-01 DIAGNOSIS — N186 End stage renal disease: Secondary | ICD-10-CM | POA: Diagnosis not present

## 2017-07-01 DIAGNOSIS — Z992 Dependence on renal dialysis: Secondary | ICD-10-CM | POA: Diagnosis not present

## 2017-07-01 DIAGNOSIS — N2581 Secondary hyperparathyroidism of renal origin: Secondary | ICD-10-CM | POA: Diagnosis not present

## 2017-07-03 DIAGNOSIS — Z992 Dependence on renal dialysis: Secondary | ICD-10-CM | POA: Diagnosis not present

## 2017-07-03 DIAGNOSIS — N186 End stage renal disease: Secondary | ICD-10-CM | POA: Diagnosis not present

## 2017-07-04 DIAGNOSIS — D631 Anemia in chronic kidney disease: Secondary | ICD-10-CM | POA: Diagnosis not present

## 2017-07-04 DIAGNOSIS — N186 End stage renal disease: Secondary | ICD-10-CM | POA: Diagnosis not present

## 2017-07-04 DIAGNOSIS — D509 Iron deficiency anemia, unspecified: Secondary | ICD-10-CM | POA: Diagnosis not present

## 2017-07-04 DIAGNOSIS — Z992 Dependence on renal dialysis: Secondary | ICD-10-CM | POA: Diagnosis not present

## 2017-07-04 DIAGNOSIS — N2581 Secondary hyperparathyroidism of renal origin: Secondary | ICD-10-CM | POA: Diagnosis not present

## 2017-07-06 DIAGNOSIS — D631 Anemia in chronic kidney disease: Secondary | ICD-10-CM | POA: Diagnosis not present

## 2017-07-06 DIAGNOSIS — Z992 Dependence on renal dialysis: Secondary | ICD-10-CM | POA: Diagnosis not present

## 2017-07-06 DIAGNOSIS — D509 Iron deficiency anemia, unspecified: Secondary | ICD-10-CM | POA: Diagnosis not present

## 2017-07-06 DIAGNOSIS — N2581 Secondary hyperparathyroidism of renal origin: Secondary | ICD-10-CM | POA: Diagnosis not present

## 2017-07-06 DIAGNOSIS — N186 End stage renal disease: Secondary | ICD-10-CM | POA: Diagnosis not present

## 2017-07-08 DIAGNOSIS — D631 Anemia in chronic kidney disease: Secondary | ICD-10-CM | POA: Diagnosis not present

## 2017-07-08 DIAGNOSIS — N2581 Secondary hyperparathyroidism of renal origin: Secondary | ICD-10-CM | POA: Diagnosis not present

## 2017-07-08 DIAGNOSIS — N186 End stage renal disease: Secondary | ICD-10-CM | POA: Diagnosis not present

## 2017-07-08 DIAGNOSIS — Z992 Dependence on renal dialysis: Secondary | ICD-10-CM | POA: Diagnosis not present

## 2017-07-08 DIAGNOSIS — D509 Iron deficiency anemia, unspecified: Secondary | ICD-10-CM | POA: Diagnosis not present

## 2017-07-11 DIAGNOSIS — Z992 Dependence on renal dialysis: Secondary | ICD-10-CM | POA: Diagnosis not present

## 2017-07-11 DIAGNOSIS — D509 Iron deficiency anemia, unspecified: Secondary | ICD-10-CM | POA: Diagnosis not present

## 2017-07-11 DIAGNOSIS — N186 End stage renal disease: Secondary | ICD-10-CM | POA: Diagnosis not present

## 2017-07-11 DIAGNOSIS — N2581 Secondary hyperparathyroidism of renal origin: Secondary | ICD-10-CM | POA: Diagnosis not present

## 2017-07-11 DIAGNOSIS — D631 Anemia in chronic kidney disease: Secondary | ICD-10-CM | POA: Diagnosis not present

## 2017-07-11 DIAGNOSIS — E119 Type 2 diabetes mellitus without complications: Secondary | ICD-10-CM | POA: Diagnosis not present

## 2017-07-13 DIAGNOSIS — D509 Iron deficiency anemia, unspecified: Secondary | ICD-10-CM | POA: Diagnosis not present

## 2017-07-13 DIAGNOSIS — N186 End stage renal disease: Secondary | ICD-10-CM | POA: Diagnosis not present

## 2017-07-13 DIAGNOSIS — D631 Anemia in chronic kidney disease: Secondary | ICD-10-CM | POA: Diagnosis not present

## 2017-07-13 DIAGNOSIS — Z992 Dependence on renal dialysis: Secondary | ICD-10-CM | POA: Diagnosis not present

## 2017-07-13 DIAGNOSIS — N2581 Secondary hyperparathyroidism of renal origin: Secondary | ICD-10-CM | POA: Diagnosis not present

## 2017-07-18 DIAGNOSIS — N2581 Secondary hyperparathyroidism of renal origin: Secondary | ICD-10-CM | POA: Diagnosis not present

## 2017-07-18 DIAGNOSIS — D509 Iron deficiency anemia, unspecified: Secondary | ICD-10-CM | POA: Diagnosis not present

## 2017-07-18 DIAGNOSIS — D631 Anemia in chronic kidney disease: Secondary | ICD-10-CM | POA: Diagnosis not present

## 2017-07-18 DIAGNOSIS — N186 End stage renal disease: Secondary | ICD-10-CM | POA: Diagnosis not present

## 2017-07-18 DIAGNOSIS — Z992 Dependence on renal dialysis: Secondary | ICD-10-CM | POA: Diagnosis not present

## 2017-07-20 DIAGNOSIS — D509 Iron deficiency anemia, unspecified: Secondary | ICD-10-CM | POA: Diagnosis not present

## 2017-07-20 DIAGNOSIS — D631 Anemia in chronic kidney disease: Secondary | ICD-10-CM | POA: Diagnosis not present

## 2017-07-20 DIAGNOSIS — N2581 Secondary hyperparathyroidism of renal origin: Secondary | ICD-10-CM | POA: Diagnosis not present

## 2017-07-20 DIAGNOSIS — Z992 Dependence on renal dialysis: Secondary | ICD-10-CM | POA: Diagnosis not present

## 2017-07-20 DIAGNOSIS — N186 End stage renal disease: Secondary | ICD-10-CM | POA: Diagnosis not present

## 2017-07-22 DIAGNOSIS — N186 End stage renal disease: Secondary | ICD-10-CM | POA: Diagnosis not present

## 2017-07-22 DIAGNOSIS — N2581 Secondary hyperparathyroidism of renal origin: Secondary | ICD-10-CM | POA: Diagnosis not present

## 2017-07-22 DIAGNOSIS — D631 Anemia in chronic kidney disease: Secondary | ICD-10-CM | POA: Diagnosis not present

## 2017-07-22 DIAGNOSIS — Z992 Dependence on renal dialysis: Secondary | ICD-10-CM | POA: Diagnosis not present

## 2017-07-22 DIAGNOSIS — D509 Iron deficiency anemia, unspecified: Secondary | ICD-10-CM | POA: Diagnosis not present

## 2017-07-25 DIAGNOSIS — D509 Iron deficiency anemia, unspecified: Secondary | ICD-10-CM | POA: Diagnosis not present

## 2017-07-25 DIAGNOSIS — Z992 Dependence on renal dialysis: Secondary | ICD-10-CM | POA: Diagnosis not present

## 2017-07-25 DIAGNOSIS — N186 End stage renal disease: Secondary | ICD-10-CM | POA: Diagnosis not present

## 2017-07-25 DIAGNOSIS — D631 Anemia in chronic kidney disease: Secondary | ICD-10-CM | POA: Diagnosis not present

## 2017-07-25 DIAGNOSIS — N2581 Secondary hyperparathyroidism of renal origin: Secondary | ICD-10-CM | POA: Diagnosis not present

## 2017-07-27 DIAGNOSIS — D631 Anemia in chronic kidney disease: Secondary | ICD-10-CM | POA: Diagnosis not present

## 2017-07-27 DIAGNOSIS — D509 Iron deficiency anemia, unspecified: Secondary | ICD-10-CM | POA: Diagnosis not present

## 2017-07-27 DIAGNOSIS — N186 End stage renal disease: Secondary | ICD-10-CM | POA: Diagnosis not present

## 2017-07-27 DIAGNOSIS — Z992 Dependence on renal dialysis: Secondary | ICD-10-CM | POA: Diagnosis not present

## 2017-07-27 DIAGNOSIS — N2581 Secondary hyperparathyroidism of renal origin: Secondary | ICD-10-CM | POA: Diagnosis not present

## 2017-07-29 DIAGNOSIS — Z992 Dependence on renal dialysis: Secondary | ICD-10-CM | POA: Diagnosis not present

## 2017-07-29 DIAGNOSIS — N186 End stage renal disease: Secondary | ICD-10-CM | POA: Diagnosis not present

## 2017-07-29 DIAGNOSIS — D509 Iron deficiency anemia, unspecified: Secondary | ICD-10-CM | POA: Diagnosis not present

## 2017-07-29 DIAGNOSIS — D631 Anemia in chronic kidney disease: Secondary | ICD-10-CM | POA: Diagnosis not present

## 2017-07-29 DIAGNOSIS — N2581 Secondary hyperparathyroidism of renal origin: Secondary | ICD-10-CM | POA: Diagnosis not present

## 2017-08-01 DIAGNOSIS — D509 Iron deficiency anemia, unspecified: Secondary | ICD-10-CM | POA: Diagnosis not present

## 2017-08-01 DIAGNOSIS — D631 Anemia in chronic kidney disease: Secondary | ICD-10-CM | POA: Diagnosis not present

## 2017-08-01 DIAGNOSIS — N2581 Secondary hyperparathyroidism of renal origin: Secondary | ICD-10-CM | POA: Diagnosis not present

## 2017-08-01 DIAGNOSIS — Z992 Dependence on renal dialysis: Secondary | ICD-10-CM | POA: Diagnosis not present

## 2017-08-01 DIAGNOSIS — N186 End stage renal disease: Secondary | ICD-10-CM | POA: Diagnosis not present

## 2017-08-03 DIAGNOSIS — N2581 Secondary hyperparathyroidism of renal origin: Secondary | ICD-10-CM | POA: Diagnosis not present

## 2017-08-03 DIAGNOSIS — D509 Iron deficiency anemia, unspecified: Secondary | ICD-10-CM | POA: Diagnosis not present

## 2017-08-03 DIAGNOSIS — N186 End stage renal disease: Secondary | ICD-10-CM | POA: Diagnosis not present

## 2017-08-03 DIAGNOSIS — Z992 Dependence on renal dialysis: Secondary | ICD-10-CM | POA: Diagnosis not present

## 2017-08-03 DIAGNOSIS — D631 Anemia in chronic kidney disease: Secondary | ICD-10-CM | POA: Diagnosis not present

## 2017-08-08 DIAGNOSIS — N2581 Secondary hyperparathyroidism of renal origin: Secondary | ICD-10-CM | POA: Diagnosis not present

## 2017-08-08 DIAGNOSIS — N186 End stage renal disease: Secondary | ICD-10-CM | POA: Diagnosis not present

## 2017-08-08 DIAGNOSIS — Z992 Dependence on renal dialysis: Secondary | ICD-10-CM | POA: Diagnosis not present

## 2017-08-08 DIAGNOSIS — D509 Iron deficiency anemia, unspecified: Secondary | ICD-10-CM | POA: Diagnosis not present

## 2017-08-08 DIAGNOSIS — D631 Anemia in chronic kidney disease: Secondary | ICD-10-CM | POA: Diagnosis not present

## 2017-08-10 DIAGNOSIS — D631 Anemia in chronic kidney disease: Secondary | ICD-10-CM | POA: Diagnosis not present

## 2017-08-10 DIAGNOSIS — N2581 Secondary hyperparathyroidism of renal origin: Secondary | ICD-10-CM | POA: Diagnosis not present

## 2017-08-10 DIAGNOSIS — D509 Iron deficiency anemia, unspecified: Secondary | ICD-10-CM | POA: Diagnosis not present

## 2017-08-10 DIAGNOSIS — N186 End stage renal disease: Secondary | ICD-10-CM | POA: Diagnosis not present

## 2017-08-10 DIAGNOSIS — Z992 Dependence on renal dialysis: Secondary | ICD-10-CM | POA: Diagnosis not present

## 2017-08-12 DIAGNOSIS — Z992 Dependence on renal dialysis: Secondary | ICD-10-CM | POA: Diagnosis not present

## 2017-08-12 DIAGNOSIS — N186 End stage renal disease: Secondary | ICD-10-CM | POA: Diagnosis not present

## 2017-08-12 DIAGNOSIS — D631 Anemia in chronic kidney disease: Secondary | ICD-10-CM | POA: Diagnosis not present

## 2017-08-12 DIAGNOSIS — D509 Iron deficiency anemia, unspecified: Secondary | ICD-10-CM | POA: Diagnosis not present

## 2017-08-12 DIAGNOSIS — N2581 Secondary hyperparathyroidism of renal origin: Secondary | ICD-10-CM | POA: Diagnosis not present

## 2017-08-15 DIAGNOSIS — D509 Iron deficiency anemia, unspecified: Secondary | ICD-10-CM | POA: Diagnosis not present

## 2017-08-15 DIAGNOSIS — Z992 Dependence on renal dialysis: Secondary | ICD-10-CM | POA: Diagnosis not present

## 2017-08-15 DIAGNOSIS — N186 End stage renal disease: Secondary | ICD-10-CM | POA: Diagnosis not present

## 2017-08-15 DIAGNOSIS — N2581 Secondary hyperparathyroidism of renal origin: Secondary | ICD-10-CM | POA: Diagnosis not present

## 2017-08-15 DIAGNOSIS — D631 Anemia in chronic kidney disease: Secondary | ICD-10-CM | POA: Diagnosis not present

## 2017-08-17 DIAGNOSIS — N186 End stage renal disease: Secondary | ICD-10-CM | POA: Diagnosis not present

## 2017-08-17 DIAGNOSIS — N2581 Secondary hyperparathyroidism of renal origin: Secondary | ICD-10-CM | POA: Diagnosis not present

## 2017-08-17 DIAGNOSIS — D509 Iron deficiency anemia, unspecified: Secondary | ICD-10-CM | POA: Diagnosis not present

## 2017-08-17 DIAGNOSIS — Z992 Dependence on renal dialysis: Secondary | ICD-10-CM | POA: Diagnosis not present

## 2017-08-17 DIAGNOSIS — D631 Anemia in chronic kidney disease: Secondary | ICD-10-CM | POA: Diagnosis not present

## 2017-08-19 DIAGNOSIS — D509 Iron deficiency anemia, unspecified: Secondary | ICD-10-CM | POA: Diagnosis not present

## 2017-08-19 DIAGNOSIS — D631 Anemia in chronic kidney disease: Secondary | ICD-10-CM | POA: Diagnosis not present

## 2017-08-19 DIAGNOSIS — N2581 Secondary hyperparathyroidism of renal origin: Secondary | ICD-10-CM | POA: Diagnosis not present

## 2017-08-19 DIAGNOSIS — N186 End stage renal disease: Secondary | ICD-10-CM | POA: Diagnosis not present

## 2017-08-19 DIAGNOSIS — Z992 Dependence on renal dialysis: Secondary | ICD-10-CM | POA: Diagnosis not present

## 2017-08-22 DIAGNOSIS — D631 Anemia in chronic kidney disease: Secondary | ICD-10-CM | POA: Diagnosis not present

## 2017-08-22 DIAGNOSIS — N186 End stage renal disease: Secondary | ICD-10-CM | POA: Diagnosis not present

## 2017-08-22 DIAGNOSIS — D509 Iron deficiency anemia, unspecified: Secondary | ICD-10-CM | POA: Diagnosis not present

## 2017-08-22 DIAGNOSIS — N2581 Secondary hyperparathyroidism of renal origin: Secondary | ICD-10-CM | POA: Diagnosis not present

## 2017-08-22 DIAGNOSIS — Z992 Dependence on renal dialysis: Secondary | ICD-10-CM | POA: Diagnosis not present

## 2017-08-24 DIAGNOSIS — N2581 Secondary hyperparathyroidism of renal origin: Secondary | ICD-10-CM | POA: Diagnosis not present

## 2017-08-24 DIAGNOSIS — N186 End stage renal disease: Secondary | ICD-10-CM | POA: Diagnosis not present

## 2017-08-24 DIAGNOSIS — D631 Anemia in chronic kidney disease: Secondary | ICD-10-CM | POA: Diagnosis not present

## 2017-08-24 DIAGNOSIS — Z992 Dependence on renal dialysis: Secondary | ICD-10-CM | POA: Diagnosis not present

## 2017-08-24 DIAGNOSIS — D509 Iron deficiency anemia, unspecified: Secondary | ICD-10-CM | POA: Diagnosis not present

## 2017-08-26 DIAGNOSIS — N186 End stage renal disease: Secondary | ICD-10-CM | POA: Diagnosis not present

## 2017-08-26 DIAGNOSIS — N2581 Secondary hyperparathyroidism of renal origin: Secondary | ICD-10-CM | POA: Diagnosis not present

## 2017-08-26 DIAGNOSIS — Z992 Dependence on renal dialysis: Secondary | ICD-10-CM | POA: Diagnosis not present

## 2017-08-26 DIAGNOSIS — D509 Iron deficiency anemia, unspecified: Secondary | ICD-10-CM | POA: Diagnosis not present

## 2017-08-26 DIAGNOSIS — D631 Anemia in chronic kidney disease: Secondary | ICD-10-CM | POA: Diagnosis not present

## 2017-08-29 DIAGNOSIS — N186 End stage renal disease: Secondary | ICD-10-CM | POA: Diagnosis not present

## 2017-08-29 DIAGNOSIS — N2581 Secondary hyperparathyroidism of renal origin: Secondary | ICD-10-CM | POA: Diagnosis not present

## 2017-08-29 DIAGNOSIS — D631 Anemia in chronic kidney disease: Secondary | ICD-10-CM | POA: Diagnosis not present

## 2017-08-29 DIAGNOSIS — D509 Iron deficiency anemia, unspecified: Secondary | ICD-10-CM | POA: Diagnosis not present

## 2017-08-29 DIAGNOSIS — Z992 Dependence on renal dialysis: Secondary | ICD-10-CM | POA: Diagnosis not present

## 2017-08-31 DIAGNOSIS — D509 Iron deficiency anemia, unspecified: Secondary | ICD-10-CM | POA: Diagnosis not present

## 2017-08-31 DIAGNOSIS — D631 Anemia in chronic kidney disease: Secondary | ICD-10-CM | POA: Diagnosis not present

## 2017-08-31 DIAGNOSIS — N2581 Secondary hyperparathyroidism of renal origin: Secondary | ICD-10-CM | POA: Diagnosis not present

## 2017-08-31 DIAGNOSIS — N186 End stage renal disease: Secondary | ICD-10-CM | POA: Diagnosis not present

## 2017-08-31 DIAGNOSIS — Z992 Dependence on renal dialysis: Secondary | ICD-10-CM | POA: Diagnosis not present

## 2017-09-02 DIAGNOSIS — N186 End stage renal disease: Secondary | ICD-10-CM | POA: Diagnosis not present

## 2017-09-02 DIAGNOSIS — Z992 Dependence on renal dialysis: Secondary | ICD-10-CM | POA: Diagnosis not present

## 2017-09-02 DIAGNOSIS — D509 Iron deficiency anemia, unspecified: Secondary | ICD-10-CM | POA: Diagnosis not present

## 2017-09-02 DIAGNOSIS — N2581 Secondary hyperparathyroidism of renal origin: Secondary | ICD-10-CM | POA: Diagnosis not present

## 2017-09-02 DIAGNOSIS — D631 Anemia in chronic kidney disease: Secondary | ICD-10-CM | POA: Diagnosis not present

## 2017-09-03 DIAGNOSIS — Z992 Dependence on renal dialysis: Secondary | ICD-10-CM | POA: Diagnosis not present

## 2017-09-03 DIAGNOSIS — N186 End stage renal disease: Secondary | ICD-10-CM | POA: Diagnosis not present

## 2017-09-05 DIAGNOSIS — N2581 Secondary hyperparathyroidism of renal origin: Secondary | ICD-10-CM | POA: Diagnosis not present

## 2017-09-05 DIAGNOSIS — N186 End stage renal disease: Secondary | ICD-10-CM | POA: Diagnosis not present

## 2017-09-05 DIAGNOSIS — D509 Iron deficiency anemia, unspecified: Secondary | ICD-10-CM | POA: Diagnosis not present

## 2017-09-05 DIAGNOSIS — Z992 Dependence on renal dialysis: Secondary | ICD-10-CM | POA: Diagnosis not present

## 2017-09-05 DIAGNOSIS — D631 Anemia in chronic kidney disease: Secondary | ICD-10-CM | POA: Diagnosis not present

## 2017-09-07 DIAGNOSIS — N2581 Secondary hyperparathyroidism of renal origin: Secondary | ICD-10-CM | POA: Diagnosis not present

## 2017-09-07 DIAGNOSIS — N186 End stage renal disease: Secondary | ICD-10-CM | POA: Diagnosis not present

## 2017-09-07 DIAGNOSIS — D509 Iron deficiency anemia, unspecified: Secondary | ICD-10-CM | POA: Diagnosis not present

## 2017-09-07 DIAGNOSIS — Z992 Dependence on renal dialysis: Secondary | ICD-10-CM | POA: Diagnosis not present

## 2017-09-07 DIAGNOSIS — D631 Anemia in chronic kidney disease: Secondary | ICD-10-CM | POA: Diagnosis not present

## 2017-09-09 DIAGNOSIS — Z992 Dependence on renal dialysis: Secondary | ICD-10-CM | POA: Diagnosis not present

## 2017-09-09 DIAGNOSIS — D509 Iron deficiency anemia, unspecified: Secondary | ICD-10-CM | POA: Diagnosis not present

## 2017-09-09 DIAGNOSIS — N2581 Secondary hyperparathyroidism of renal origin: Secondary | ICD-10-CM | POA: Diagnosis not present

## 2017-09-09 DIAGNOSIS — N186 End stage renal disease: Secondary | ICD-10-CM | POA: Diagnosis not present

## 2017-09-09 DIAGNOSIS — D631 Anemia in chronic kidney disease: Secondary | ICD-10-CM | POA: Diagnosis not present

## 2017-09-12 DIAGNOSIS — N2581 Secondary hyperparathyroidism of renal origin: Secondary | ICD-10-CM | POA: Diagnosis not present

## 2017-09-12 DIAGNOSIS — Z992 Dependence on renal dialysis: Secondary | ICD-10-CM | POA: Diagnosis not present

## 2017-09-12 DIAGNOSIS — N186 End stage renal disease: Secondary | ICD-10-CM | POA: Diagnosis not present

## 2017-09-12 DIAGNOSIS — D631 Anemia in chronic kidney disease: Secondary | ICD-10-CM | POA: Diagnosis not present

## 2017-09-12 DIAGNOSIS — D509 Iron deficiency anemia, unspecified: Secondary | ICD-10-CM | POA: Diagnosis not present

## 2017-09-14 DIAGNOSIS — N186 End stage renal disease: Secondary | ICD-10-CM | POA: Diagnosis not present

## 2017-09-14 DIAGNOSIS — N2581 Secondary hyperparathyroidism of renal origin: Secondary | ICD-10-CM | POA: Diagnosis not present

## 2017-09-14 DIAGNOSIS — D631 Anemia in chronic kidney disease: Secondary | ICD-10-CM | POA: Diagnosis not present

## 2017-09-14 DIAGNOSIS — D509 Iron deficiency anemia, unspecified: Secondary | ICD-10-CM | POA: Diagnosis not present

## 2017-09-14 DIAGNOSIS — Z992 Dependence on renal dialysis: Secondary | ICD-10-CM | POA: Diagnosis not present

## 2017-09-16 DIAGNOSIS — Z992 Dependence on renal dialysis: Secondary | ICD-10-CM | POA: Diagnosis not present

## 2017-09-16 DIAGNOSIS — D509 Iron deficiency anemia, unspecified: Secondary | ICD-10-CM | POA: Diagnosis not present

## 2017-09-16 DIAGNOSIS — N2581 Secondary hyperparathyroidism of renal origin: Secondary | ICD-10-CM | POA: Diagnosis not present

## 2017-09-16 DIAGNOSIS — N186 End stage renal disease: Secondary | ICD-10-CM | POA: Diagnosis not present

## 2017-09-16 DIAGNOSIS — D631 Anemia in chronic kidney disease: Secondary | ICD-10-CM | POA: Diagnosis not present

## 2017-09-19 DIAGNOSIS — N186 End stage renal disease: Secondary | ICD-10-CM | POA: Diagnosis not present

## 2017-09-19 DIAGNOSIS — D631 Anemia in chronic kidney disease: Secondary | ICD-10-CM | POA: Diagnosis not present

## 2017-09-19 DIAGNOSIS — D509 Iron deficiency anemia, unspecified: Secondary | ICD-10-CM | POA: Diagnosis not present

## 2017-09-19 DIAGNOSIS — Z992 Dependence on renal dialysis: Secondary | ICD-10-CM | POA: Diagnosis not present

## 2017-09-19 DIAGNOSIS — N2581 Secondary hyperparathyroidism of renal origin: Secondary | ICD-10-CM | POA: Diagnosis not present

## 2017-09-21 DIAGNOSIS — N2581 Secondary hyperparathyroidism of renal origin: Secondary | ICD-10-CM | POA: Diagnosis not present

## 2017-09-21 DIAGNOSIS — D509 Iron deficiency anemia, unspecified: Secondary | ICD-10-CM | POA: Diagnosis not present

## 2017-09-21 DIAGNOSIS — D631 Anemia in chronic kidney disease: Secondary | ICD-10-CM | POA: Diagnosis not present

## 2017-09-21 DIAGNOSIS — Z992 Dependence on renal dialysis: Secondary | ICD-10-CM | POA: Diagnosis not present

## 2017-09-21 DIAGNOSIS — N186 End stage renal disease: Secondary | ICD-10-CM | POA: Diagnosis not present

## 2017-09-23 DIAGNOSIS — N2581 Secondary hyperparathyroidism of renal origin: Secondary | ICD-10-CM | POA: Diagnosis not present

## 2017-09-23 DIAGNOSIS — Z992 Dependence on renal dialysis: Secondary | ICD-10-CM | POA: Diagnosis not present

## 2017-09-23 DIAGNOSIS — D509 Iron deficiency anemia, unspecified: Secondary | ICD-10-CM | POA: Diagnosis not present

## 2017-09-23 DIAGNOSIS — N186 End stage renal disease: Secondary | ICD-10-CM | POA: Diagnosis not present

## 2017-09-23 DIAGNOSIS — D631 Anemia in chronic kidney disease: Secondary | ICD-10-CM | POA: Diagnosis not present

## 2017-09-26 DIAGNOSIS — N186 End stage renal disease: Secondary | ICD-10-CM | POA: Diagnosis not present

## 2017-09-26 DIAGNOSIS — D631 Anemia in chronic kidney disease: Secondary | ICD-10-CM | POA: Diagnosis not present

## 2017-09-26 DIAGNOSIS — D509 Iron deficiency anemia, unspecified: Secondary | ICD-10-CM | POA: Diagnosis not present

## 2017-09-26 DIAGNOSIS — N2581 Secondary hyperparathyroidism of renal origin: Secondary | ICD-10-CM | POA: Diagnosis not present

## 2017-09-26 DIAGNOSIS — Z992 Dependence on renal dialysis: Secondary | ICD-10-CM | POA: Diagnosis not present

## 2017-09-28 DIAGNOSIS — N186 End stage renal disease: Secondary | ICD-10-CM | POA: Diagnosis not present

## 2017-09-28 DIAGNOSIS — N2581 Secondary hyperparathyroidism of renal origin: Secondary | ICD-10-CM | POA: Diagnosis not present

## 2017-09-28 DIAGNOSIS — D509 Iron deficiency anemia, unspecified: Secondary | ICD-10-CM | POA: Diagnosis not present

## 2017-09-28 DIAGNOSIS — Z992 Dependence on renal dialysis: Secondary | ICD-10-CM | POA: Diagnosis not present

## 2017-09-28 DIAGNOSIS — D631 Anemia in chronic kidney disease: Secondary | ICD-10-CM | POA: Diagnosis not present

## 2017-09-30 DIAGNOSIS — D509 Iron deficiency anemia, unspecified: Secondary | ICD-10-CM | POA: Diagnosis not present

## 2017-09-30 DIAGNOSIS — Z992 Dependence on renal dialysis: Secondary | ICD-10-CM | POA: Diagnosis not present

## 2017-09-30 DIAGNOSIS — N2581 Secondary hyperparathyroidism of renal origin: Secondary | ICD-10-CM | POA: Diagnosis not present

## 2017-09-30 DIAGNOSIS — D631 Anemia in chronic kidney disease: Secondary | ICD-10-CM | POA: Diagnosis not present

## 2017-09-30 DIAGNOSIS — N186 End stage renal disease: Secondary | ICD-10-CM | POA: Diagnosis not present

## 2017-10-03 DIAGNOSIS — Z992 Dependence on renal dialysis: Secondary | ICD-10-CM | POA: Diagnosis not present

## 2017-10-03 DIAGNOSIS — N2581 Secondary hyperparathyroidism of renal origin: Secondary | ICD-10-CM | POA: Diagnosis not present

## 2017-10-03 DIAGNOSIS — N186 End stage renal disease: Secondary | ICD-10-CM | POA: Diagnosis not present

## 2017-10-03 DIAGNOSIS — D631 Anemia in chronic kidney disease: Secondary | ICD-10-CM | POA: Diagnosis not present

## 2017-10-03 DIAGNOSIS — D509 Iron deficiency anemia, unspecified: Secondary | ICD-10-CM | POA: Diagnosis not present

## 2017-10-05 DIAGNOSIS — D631 Anemia in chronic kidney disease: Secondary | ICD-10-CM | POA: Diagnosis not present

## 2017-10-05 DIAGNOSIS — N186 End stage renal disease: Secondary | ICD-10-CM | POA: Diagnosis not present

## 2017-10-05 DIAGNOSIS — D509 Iron deficiency anemia, unspecified: Secondary | ICD-10-CM | POA: Diagnosis not present

## 2017-10-05 DIAGNOSIS — Z992 Dependence on renal dialysis: Secondary | ICD-10-CM | POA: Diagnosis not present

## 2017-10-05 DIAGNOSIS — N2581 Secondary hyperparathyroidism of renal origin: Secondary | ICD-10-CM | POA: Diagnosis not present

## 2017-10-05 DIAGNOSIS — Z23 Encounter for immunization: Secondary | ICD-10-CM | POA: Diagnosis not present

## 2017-10-07 DIAGNOSIS — N186 End stage renal disease: Secondary | ICD-10-CM | POA: Diagnosis not present

## 2017-10-07 DIAGNOSIS — Z992 Dependence on renal dialysis: Secondary | ICD-10-CM | POA: Diagnosis not present

## 2017-10-07 DIAGNOSIS — N2581 Secondary hyperparathyroidism of renal origin: Secondary | ICD-10-CM | POA: Diagnosis not present

## 2017-10-07 DIAGNOSIS — Z23 Encounter for immunization: Secondary | ICD-10-CM | POA: Diagnosis not present

## 2017-10-07 DIAGNOSIS — D631 Anemia in chronic kidney disease: Secondary | ICD-10-CM | POA: Diagnosis not present

## 2017-10-07 DIAGNOSIS — D509 Iron deficiency anemia, unspecified: Secondary | ICD-10-CM | POA: Diagnosis not present

## 2017-10-10 DIAGNOSIS — D631 Anemia in chronic kidney disease: Secondary | ICD-10-CM | POA: Diagnosis not present

## 2017-10-10 DIAGNOSIS — Z992 Dependence on renal dialysis: Secondary | ICD-10-CM | POA: Diagnosis not present

## 2017-10-10 DIAGNOSIS — N2581 Secondary hyperparathyroidism of renal origin: Secondary | ICD-10-CM | POA: Diagnosis not present

## 2017-10-10 DIAGNOSIS — D509 Iron deficiency anemia, unspecified: Secondary | ICD-10-CM | POA: Diagnosis not present

## 2017-10-10 DIAGNOSIS — N186 End stage renal disease: Secondary | ICD-10-CM | POA: Diagnosis not present

## 2017-10-10 DIAGNOSIS — Z23 Encounter for immunization: Secondary | ICD-10-CM | POA: Diagnosis not present

## 2017-10-12 DIAGNOSIS — D509 Iron deficiency anemia, unspecified: Secondary | ICD-10-CM | POA: Diagnosis not present

## 2017-10-12 DIAGNOSIS — N2581 Secondary hyperparathyroidism of renal origin: Secondary | ICD-10-CM | POA: Diagnosis not present

## 2017-10-12 DIAGNOSIS — Z992 Dependence on renal dialysis: Secondary | ICD-10-CM | POA: Diagnosis not present

## 2017-10-12 DIAGNOSIS — D631 Anemia in chronic kidney disease: Secondary | ICD-10-CM | POA: Diagnosis not present

## 2017-10-12 DIAGNOSIS — Z23 Encounter for immunization: Secondary | ICD-10-CM | POA: Diagnosis not present

## 2017-10-12 DIAGNOSIS — N186 End stage renal disease: Secondary | ICD-10-CM | POA: Diagnosis not present

## 2017-10-13 ENCOUNTER — Encounter: Payer: Medicare Other | Admitting: Family Medicine

## 2017-10-14 DIAGNOSIS — N2581 Secondary hyperparathyroidism of renal origin: Secondary | ICD-10-CM | POA: Diagnosis not present

## 2017-10-14 DIAGNOSIS — D509 Iron deficiency anemia, unspecified: Secondary | ICD-10-CM | POA: Diagnosis not present

## 2017-10-14 DIAGNOSIS — D631 Anemia in chronic kidney disease: Secondary | ICD-10-CM | POA: Diagnosis not present

## 2017-10-14 DIAGNOSIS — Z992 Dependence on renal dialysis: Secondary | ICD-10-CM | POA: Diagnosis not present

## 2017-10-14 DIAGNOSIS — Z23 Encounter for immunization: Secondary | ICD-10-CM | POA: Diagnosis not present

## 2017-10-14 DIAGNOSIS — N186 End stage renal disease: Secondary | ICD-10-CM | POA: Diagnosis not present

## 2017-10-17 DIAGNOSIS — Z992 Dependence on renal dialysis: Secondary | ICD-10-CM | POA: Diagnosis not present

## 2017-10-17 DIAGNOSIS — D509 Iron deficiency anemia, unspecified: Secondary | ICD-10-CM | POA: Diagnosis not present

## 2017-10-17 DIAGNOSIS — N186 End stage renal disease: Secondary | ICD-10-CM | POA: Diagnosis not present

## 2017-10-17 DIAGNOSIS — D631 Anemia in chronic kidney disease: Secondary | ICD-10-CM | POA: Diagnosis not present

## 2017-10-17 DIAGNOSIS — Z23 Encounter for immunization: Secondary | ICD-10-CM | POA: Diagnosis not present

## 2017-10-17 DIAGNOSIS — N2581 Secondary hyperparathyroidism of renal origin: Secondary | ICD-10-CM | POA: Diagnosis not present

## 2017-10-17 DIAGNOSIS — E119 Type 2 diabetes mellitus without complications: Secondary | ICD-10-CM | POA: Diagnosis not present

## 2017-10-19 ENCOUNTER — Encounter: Payer: Medicare Other | Admitting: Family Medicine

## 2017-10-19 DIAGNOSIS — D631 Anemia in chronic kidney disease: Secondary | ICD-10-CM | POA: Diagnosis not present

## 2017-10-19 DIAGNOSIS — N2581 Secondary hyperparathyroidism of renal origin: Secondary | ICD-10-CM | POA: Diagnosis not present

## 2017-10-19 DIAGNOSIS — Z23 Encounter for immunization: Secondary | ICD-10-CM | POA: Diagnosis not present

## 2017-10-19 DIAGNOSIS — D509 Iron deficiency anemia, unspecified: Secondary | ICD-10-CM | POA: Diagnosis not present

## 2017-10-19 DIAGNOSIS — N186 End stage renal disease: Secondary | ICD-10-CM | POA: Diagnosis not present

## 2017-10-19 DIAGNOSIS — Z992 Dependence on renal dialysis: Secondary | ICD-10-CM | POA: Diagnosis not present

## 2017-10-21 DIAGNOSIS — Z23 Encounter for immunization: Secondary | ICD-10-CM | POA: Diagnosis not present

## 2017-10-21 DIAGNOSIS — N186 End stage renal disease: Secondary | ICD-10-CM | POA: Diagnosis not present

## 2017-10-21 DIAGNOSIS — Z992 Dependence on renal dialysis: Secondary | ICD-10-CM | POA: Diagnosis not present

## 2017-10-21 DIAGNOSIS — D509 Iron deficiency anemia, unspecified: Secondary | ICD-10-CM | POA: Diagnosis not present

## 2017-10-21 DIAGNOSIS — N2581 Secondary hyperparathyroidism of renal origin: Secondary | ICD-10-CM | POA: Diagnosis not present

## 2017-10-21 DIAGNOSIS — D631 Anemia in chronic kidney disease: Secondary | ICD-10-CM | POA: Diagnosis not present

## 2017-10-24 DIAGNOSIS — D631 Anemia in chronic kidney disease: Secondary | ICD-10-CM | POA: Diagnosis not present

## 2017-10-24 DIAGNOSIS — N186 End stage renal disease: Secondary | ICD-10-CM | POA: Diagnosis not present

## 2017-10-24 DIAGNOSIS — D509 Iron deficiency anemia, unspecified: Secondary | ICD-10-CM | POA: Diagnosis not present

## 2017-10-24 DIAGNOSIS — Z992 Dependence on renal dialysis: Secondary | ICD-10-CM | POA: Diagnosis not present

## 2017-10-24 DIAGNOSIS — N2581 Secondary hyperparathyroidism of renal origin: Secondary | ICD-10-CM | POA: Diagnosis not present

## 2017-10-24 DIAGNOSIS — Z23 Encounter for immunization: Secondary | ICD-10-CM | POA: Diagnosis not present

## 2017-10-24 LAB — HEMOGLOBIN A1C: Hemoglobin A1C: 6.4

## 2017-10-25 ENCOUNTER — Telehealth: Payer: Self-pay

## 2017-10-25 ENCOUNTER — Encounter: Payer: Self-pay | Admitting: Family Medicine

## 2017-10-25 ENCOUNTER — Ambulatory Visit (INDEPENDENT_AMBULATORY_CARE_PROVIDER_SITE_OTHER): Payer: Medicare Other | Admitting: Family Medicine

## 2017-10-25 VITALS — BP 110/76 | Ht 67.75 in | Wt 178.0 lb

## 2017-10-25 DIAGNOSIS — E119 Type 2 diabetes mellitus without complications: Secondary | ICD-10-CM | POA: Diagnosis not present

## 2017-10-25 DIAGNOSIS — E785 Hyperlipidemia, unspecified: Secondary | ICD-10-CM | POA: Diagnosis not present

## 2017-10-25 DIAGNOSIS — Z125 Encounter for screening for malignant neoplasm of prostate: Secondary | ICD-10-CM | POA: Diagnosis not present

## 2017-10-25 DIAGNOSIS — E1140 Type 2 diabetes mellitus with diabetic neuropathy, unspecified: Secondary | ICD-10-CM | POA: Diagnosis not present

## 2017-10-25 DIAGNOSIS — Z79899 Other long term (current) drug therapy: Secondary | ICD-10-CM

## 2017-10-25 DIAGNOSIS — Z0000 Encounter for general adult medical examination without abnormal findings: Secondary | ICD-10-CM

## 2017-10-25 DIAGNOSIS — F3341 Major depressive disorder, recurrent, in partial remission: Secondary | ICD-10-CM | POA: Diagnosis not present

## 2017-10-25 DIAGNOSIS — Z794 Long term (current) use of insulin: Secondary | ICD-10-CM

## 2017-10-25 DIAGNOSIS — I10 Essential (primary) hypertension: Secondary | ICD-10-CM | POA: Diagnosis not present

## 2017-10-25 MED ORDER — "INSULIN SYRINGE-NEEDLE U-100 31G X 15/64"" 0.3 ML MISC": 0 refills | Status: DC

## 2017-10-25 MED ORDER — LORATADINE 10 MG PO TABS: 10.0000 mg | ORAL_TABLET | Freq: Every day | ORAL | 1 refills | Status: DC

## 2017-10-25 MED ORDER — CITALOPRAM HYDROBROMIDE 20 MG PO TABS: ORAL_TABLET | ORAL | 1 refills | Status: DC

## 2017-10-25 NOTE — Progress Notes (Signed)
   Subjective:    Patient ID: Paul Gonzalez, male    DOB: 05/18/1965, 52 y.o.   MRN: [id number]  HPI The patient comes in today for a wellness visit.    A review of their health history was completed.  A review of medications was also completed.  Any needed refills; citalopram  Eating habits: health conscious  Falls/  MVA accidents in past few months: none  Regular exercise: some  Specialist pt sees on regular basis: none  Preventative health issues were discussed.   Additional concerns: humulin is expensive. $400 for a 90 day supply.   would like to change to something else  Pt brought in copy of bloodwork from dialysis center. A1C on bw was 6.4. bw done on 10/24/17.   Patient claims compliance with diabetes medication. No obvious side effects. Reports no substantial low sugar spells. Most numbers are generally in good range when checked fasting. Generally does not miss a dose of medication. Watching diabetic diet closely  Walking some  Flu shot already given   Review of Systems  Constitutional: Negative for activity change, appetite change and fever.  HENT: Negative for congestion and rhinorrhea.   Eyes: Negative for discharge.  Respiratory: Negative for cough and wheezing.   Cardiovascular: Negative for chest pain.  Gastrointestinal: Negative for abdominal pain, blood in stool and vomiting.  Genitourinary: Negative for difficulty urinating and frequency.  Musculoskeletal: Negative for neck pain.  Skin: Negative for rash.  Allergic/Immunologic: Negative for environmental allergies and food allergies.  Neurological: Negative for weakness and headaches.  Psychiatric/Behavioral: Negative for agitation.  All other systems reviewed and are negative.      Objective:   Physical Exam  Constitutional: He appears well-developed and well-nourished.  HENT:  Head: Normocephalic and atraumatic.  Right Ear: External ear normal.  Left Ear: External ear normal.  Nose: Nose  normal.  Mouth/Throat: Oropharynx is clear and moist.  Eyes: Right eye exhibits no discharge. Left eye exhibits no discharge. No scleral icterus.  Neck: Normal range of motion. Neck supple. No thyromegaly present.  Cardiovascular: Normal rate, regular rhythm and normal heart sounds.  No murmur heard. Pulmonary/Chest: Effort normal and breath sounds normal. No respiratory distress. He has no wheezes.  Abdominal: Soft. Bowel sounds are normal. He exhibits no distension and no mass. There is no tenderness.  Genitourinary: Penis normal.  Musculoskeletal: Normal range of motion. He exhibits no edema.  Lymphadenopathy:    He has no cervical adenopathy.  Neurological: He is alert. He exhibits normal muscle tone. Coordination normal.  Skin: Skin is warm and dry. No erythema.  Psychiatric: He has a normal mood and affect. His behavior is normal. Judgment normal.  Vitals reviewed.         Assessment & Plan:  Impression wellness exam.  Diet discussed.  Exercise discussed.  Colonoscopy done in 2018, was told three yrs for colon, next due 2023 sec to precanc polyps, patient has already had flu shot.  2.  Type 2 diabetes.  Recent A1c good  3.  History of diabetic neuropathy nephropathy leading to dialysis  4.  Depression clinically stable patient would like to stay on medications discussed  Appropriate blood work.  Diet exercise discussed.  Switch to less costly form of insulin.  Follow-up in 6 months.

## 2017-10-25 NOTE — Telephone Encounter (Signed)
Per [facility] pt is due for NPH Humulin N 100 unit/ml 10 units Qhs with 5 refills. They say pt wants to know if he can switch to Relion Novolin N. Please advise.

## 2017-10-25 NOTE — Telephone Encounter (Signed)
Yes lets switch at the same dose

## 2017-10-25 NOTE — Telephone Encounter (Signed)
Paul Gonzalez at [facility] is aware.

## 2017-10-26 DIAGNOSIS — Z992 Dependence on renal dialysis: Secondary | ICD-10-CM | POA: Diagnosis not present

## 2017-10-26 DIAGNOSIS — D631 Anemia in chronic kidney disease: Secondary | ICD-10-CM | POA: Diagnosis not present

## 2017-10-26 DIAGNOSIS — Z23 Encounter for immunization: Secondary | ICD-10-CM | POA: Diagnosis not present

## 2017-10-26 DIAGNOSIS — N2581 Secondary hyperparathyroidism of renal origin: Secondary | ICD-10-CM | POA: Diagnosis not present

## 2017-10-26 DIAGNOSIS — D509 Iron deficiency anemia, unspecified: Secondary | ICD-10-CM | POA: Diagnosis not present

## 2017-10-26 DIAGNOSIS — N186 End stage renal disease: Secondary | ICD-10-CM | POA: Diagnosis not present

## 2017-10-27 DIAGNOSIS — E785 Hyperlipidemia, unspecified: Secondary | ICD-10-CM | POA: Diagnosis not present

## 2017-10-27 DIAGNOSIS — Z0000 Encounter for general adult medical examination without abnormal findings: Secondary | ICD-10-CM | POA: Diagnosis not present

## 2017-10-27 DIAGNOSIS — Z125 Encounter for screening for malignant neoplasm of prostate: Secondary | ICD-10-CM | POA: Diagnosis not present

## 2017-10-27 DIAGNOSIS — Z79899 Other long term (current) drug therapy: Secondary | ICD-10-CM | POA: Diagnosis not present

## 2017-10-28 DIAGNOSIS — N186 End stage renal disease: Secondary | ICD-10-CM | POA: Diagnosis not present

## 2017-10-28 DIAGNOSIS — Z992 Dependence on renal dialysis: Secondary | ICD-10-CM | POA: Diagnosis not present

## 2017-10-28 DIAGNOSIS — D509 Iron deficiency anemia, unspecified: Secondary | ICD-10-CM | POA: Diagnosis not present

## 2017-10-28 DIAGNOSIS — N2581 Secondary hyperparathyroidism of renal origin: Secondary | ICD-10-CM | POA: Diagnosis not present

## 2017-10-28 DIAGNOSIS — D631 Anemia in chronic kidney disease: Secondary | ICD-10-CM | POA: Diagnosis not present

## 2017-10-28 DIAGNOSIS — Z23 Encounter for immunization: Secondary | ICD-10-CM | POA: Diagnosis not present

## 2017-10-28 LAB — HEPATIC FUNCTION PANEL
ALBUMIN: 4.4 g/dL (ref 3.5–5.5)
ALK PHOS: 195 IU/L — AB (ref 39–117)
ALT: 23 IU/L (ref 0–44)
AST: 14 IU/L (ref 0–40)
BILIRUBIN TOTAL: 0.2 mg/dL (ref 0.0–1.2)
BILIRUBIN, DIRECT: 0.07 mg/dL (ref 0.00–0.40)
Total Protein: 7.1 g/dL (ref 6.0–8.5)

## 2017-10-28 LAB — PSA: Prostate Specific Ag, Serum: 0.2 ng/mL (ref 0.0–4.0)

## 2017-10-28 LAB — LIPID PANEL
Chol/HDL Ratio: 6.8 ratio — ABNORMAL HIGH (ref 0.0–5.0)
Cholesterol, Total: 231 mg/dL — ABNORMAL HIGH (ref 100–199)
HDL: 34 mg/dL — AB (ref >39)
LDL Calculated: 131 mg/dL — ABNORMAL HIGH (ref 0–99)
Triglycerides: 332 mg/dL — ABNORMAL HIGH (ref 0–149)
VLDL Cholesterol Cal: 66 mg/dL — ABNORMAL HIGH (ref 5–40)

## 2017-10-29 ENCOUNTER — Encounter: Payer: Self-pay | Admitting: Family Medicine

## 2017-10-31 DIAGNOSIS — D509 Iron deficiency anemia, unspecified: Secondary | ICD-10-CM | POA: Diagnosis not present

## 2017-10-31 DIAGNOSIS — N186 End stage renal disease: Secondary | ICD-10-CM | POA: Diagnosis not present

## 2017-10-31 DIAGNOSIS — N2581 Secondary hyperparathyroidism of renal origin: Secondary | ICD-10-CM | POA: Diagnosis not present

## 2017-10-31 DIAGNOSIS — D631 Anemia in chronic kidney disease: Secondary | ICD-10-CM | POA: Diagnosis not present

## 2017-10-31 DIAGNOSIS — Z992 Dependence on renal dialysis: Secondary | ICD-10-CM | POA: Diagnosis not present

## 2017-10-31 DIAGNOSIS — Z23 Encounter for immunization: Secondary | ICD-10-CM | POA: Diagnosis not present

## 2017-11-02 DIAGNOSIS — D509 Iron deficiency anemia, unspecified: Secondary | ICD-10-CM | POA: Diagnosis not present

## 2017-11-02 DIAGNOSIS — Z23 Encounter for immunization: Secondary | ICD-10-CM | POA: Diagnosis not present

## 2017-11-02 DIAGNOSIS — Z992 Dependence on renal dialysis: Secondary | ICD-10-CM | POA: Diagnosis not present

## 2017-11-02 DIAGNOSIS — N186 End stage renal disease: Secondary | ICD-10-CM | POA: Diagnosis not present

## 2017-11-02 DIAGNOSIS — N2581 Secondary hyperparathyroidism of renal origin: Secondary | ICD-10-CM | POA: Diagnosis not present

## 2017-11-02 DIAGNOSIS — D631 Anemia in chronic kidney disease: Secondary | ICD-10-CM | POA: Diagnosis not present

## 2017-11-03 DIAGNOSIS — Z992 Dependence on renal dialysis: Secondary | ICD-10-CM | POA: Diagnosis not present

## 2017-11-03 DIAGNOSIS — N186 End stage renal disease: Secondary | ICD-10-CM | POA: Diagnosis not present

## 2017-11-04 DIAGNOSIS — D631 Anemia in chronic kidney disease: Secondary | ICD-10-CM | POA: Diagnosis not present

## 2017-11-04 DIAGNOSIS — N186 End stage renal disease: Secondary | ICD-10-CM | POA: Diagnosis not present

## 2017-11-04 DIAGNOSIS — Z992 Dependence on renal dialysis: Secondary | ICD-10-CM | POA: Diagnosis not present

## 2017-11-04 DIAGNOSIS — N2581 Secondary hyperparathyroidism of renal origin: Secondary | ICD-10-CM | POA: Diagnosis not present

## 2017-11-04 DIAGNOSIS — D509 Iron deficiency anemia, unspecified: Secondary | ICD-10-CM | POA: Diagnosis not present

## 2017-11-07 DIAGNOSIS — N186 End stage renal disease: Secondary | ICD-10-CM | POA: Diagnosis not present

## 2017-11-07 DIAGNOSIS — N2581 Secondary hyperparathyroidism of renal origin: Secondary | ICD-10-CM | POA: Diagnosis not present

## 2017-11-07 DIAGNOSIS — Z992 Dependence on renal dialysis: Secondary | ICD-10-CM | POA: Diagnosis not present

## 2017-11-07 DIAGNOSIS — D509 Iron deficiency anemia, unspecified: Secondary | ICD-10-CM | POA: Diagnosis not present

## 2017-11-07 DIAGNOSIS — D631 Anemia in chronic kidney disease: Secondary | ICD-10-CM | POA: Diagnosis not present

## 2017-11-09 DIAGNOSIS — D631 Anemia in chronic kidney disease: Secondary | ICD-10-CM | POA: Diagnosis not present

## 2017-11-09 DIAGNOSIS — D509 Iron deficiency anemia, unspecified: Secondary | ICD-10-CM | POA: Diagnosis not present

## 2017-11-09 DIAGNOSIS — N186 End stage renal disease: Secondary | ICD-10-CM | POA: Diagnosis not present

## 2017-11-09 DIAGNOSIS — Z992 Dependence on renal dialysis: Secondary | ICD-10-CM | POA: Diagnosis not present

## 2017-11-09 DIAGNOSIS — N2581 Secondary hyperparathyroidism of renal origin: Secondary | ICD-10-CM | POA: Diagnosis not present

## 2017-11-11 DIAGNOSIS — D631 Anemia in chronic kidney disease: Secondary | ICD-10-CM | POA: Diagnosis not present

## 2017-11-11 DIAGNOSIS — D509 Iron deficiency anemia, unspecified: Secondary | ICD-10-CM | POA: Diagnosis not present

## 2017-11-11 DIAGNOSIS — Z992 Dependence on renal dialysis: Secondary | ICD-10-CM | POA: Diagnosis not present

## 2017-11-11 DIAGNOSIS — N186 End stage renal disease: Secondary | ICD-10-CM | POA: Diagnosis not present

## 2017-11-11 DIAGNOSIS — N2581 Secondary hyperparathyroidism of renal origin: Secondary | ICD-10-CM | POA: Diagnosis not present

## 2017-11-14 DIAGNOSIS — D631 Anemia in chronic kidney disease: Secondary | ICD-10-CM | POA: Diagnosis not present

## 2017-11-14 DIAGNOSIS — N186 End stage renal disease: Secondary | ICD-10-CM | POA: Diagnosis not present

## 2017-11-14 DIAGNOSIS — Z992 Dependence on renal dialysis: Secondary | ICD-10-CM | POA: Diagnosis not present

## 2017-11-14 DIAGNOSIS — D509 Iron deficiency anemia, unspecified: Secondary | ICD-10-CM | POA: Diagnosis not present

## 2017-11-14 DIAGNOSIS — N2581 Secondary hyperparathyroidism of renal origin: Secondary | ICD-10-CM | POA: Diagnosis not present

## 2017-11-16 DIAGNOSIS — Z992 Dependence on renal dialysis: Secondary | ICD-10-CM | POA: Diagnosis not present

## 2017-11-16 DIAGNOSIS — N186 End stage renal disease: Secondary | ICD-10-CM | POA: Diagnosis not present

## 2017-11-16 DIAGNOSIS — N2581 Secondary hyperparathyroidism of renal origin: Secondary | ICD-10-CM | POA: Diagnosis not present

## 2017-11-16 DIAGNOSIS — D509 Iron deficiency anemia, unspecified: Secondary | ICD-10-CM | POA: Diagnosis not present

## 2017-11-16 DIAGNOSIS — D631 Anemia in chronic kidney disease: Secondary | ICD-10-CM | POA: Diagnosis not present

## 2017-11-18 DIAGNOSIS — N2581 Secondary hyperparathyroidism of renal origin: Secondary | ICD-10-CM | POA: Diagnosis not present

## 2017-11-18 DIAGNOSIS — Z992 Dependence on renal dialysis: Secondary | ICD-10-CM | POA: Diagnosis not present

## 2017-11-18 DIAGNOSIS — D631 Anemia in chronic kidney disease: Secondary | ICD-10-CM | POA: Diagnosis not present

## 2017-11-18 DIAGNOSIS — D509 Iron deficiency anemia, unspecified: Secondary | ICD-10-CM | POA: Diagnosis not present

## 2017-11-18 DIAGNOSIS — N186 End stage renal disease: Secondary | ICD-10-CM | POA: Diagnosis not present

## 2017-11-21 DIAGNOSIS — D631 Anemia in chronic kidney disease: Secondary | ICD-10-CM | POA: Diagnosis not present

## 2017-11-21 DIAGNOSIS — D509 Iron deficiency anemia, unspecified: Secondary | ICD-10-CM | POA: Diagnosis not present

## 2017-11-21 DIAGNOSIS — N186 End stage renal disease: Secondary | ICD-10-CM | POA: Diagnosis not present

## 2017-11-21 DIAGNOSIS — N2581 Secondary hyperparathyroidism of renal origin: Secondary | ICD-10-CM | POA: Diagnosis not present

## 2017-11-21 DIAGNOSIS — Z992 Dependence on renal dialysis: Secondary | ICD-10-CM | POA: Diagnosis not present

## 2017-11-23 DIAGNOSIS — D631 Anemia in chronic kidney disease: Secondary | ICD-10-CM | POA: Diagnosis not present

## 2017-11-23 DIAGNOSIS — N2581 Secondary hyperparathyroidism of renal origin: Secondary | ICD-10-CM | POA: Diagnosis not present

## 2017-11-23 DIAGNOSIS — D509 Iron deficiency anemia, unspecified: Secondary | ICD-10-CM | POA: Diagnosis not present

## 2017-11-23 DIAGNOSIS — Z992 Dependence on renal dialysis: Secondary | ICD-10-CM | POA: Diagnosis not present

## 2017-11-23 DIAGNOSIS — N186 End stage renal disease: Secondary | ICD-10-CM | POA: Diagnosis not present

## 2017-11-25 DIAGNOSIS — N186 End stage renal disease: Secondary | ICD-10-CM | POA: Diagnosis not present

## 2017-11-25 DIAGNOSIS — Z992 Dependence on renal dialysis: Secondary | ICD-10-CM | POA: Diagnosis not present

## 2017-11-25 DIAGNOSIS — D631 Anemia in chronic kidney disease: Secondary | ICD-10-CM | POA: Diagnosis not present

## 2017-11-25 DIAGNOSIS — D509 Iron deficiency anemia, unspecified: Secondary | ICD-10-CM | POA: Diagnosis not present

## 2017-11-25 DIAGNOSIS — N2581 Secondary hyperparathyroidism of renal origin: Secondary | ICD-10-CM | POA: Diagnosis not present

## 2017-11-28 DIAGNOSIS — D509 Iron deficiency anemia, unspecified: Secondary | ICD-10-CM | POA: Diagnosis not present

## 2017-11-28 DIAGNOSIS — N186 End stage renal disease: Secondary | ICD-10-CM | POA: Diagnosis not present

## 2017-11-28 DIAGNOSIS — D631 Anemia in chronic kidney disease: Secondary | ICD-10-CM | POA: Diagnosis not present

## 2017-11-28 DIAGNOSIS — Z992 Dependence on renal dialysis: Secondary | ICD-10-CM | POA: Diagnosis not present

## 2017-11-28 DIAGNOSIS — N2581 Secondary hyperparathyroidism of renal origin: Secondary | ICD-10-CM | POA: Diagnosis not present

## 2017-11-30 DIAGNOSIS — N186 End stage renal disease: Secondary | ICD-10-CM | POA: Diagnosis not present

## 2017-11-30 DIAGNOSIS — Z992 Dependence on renal dialysis: Secondary | ICD-10-CM | POA: Diagnosis not present

## 2017-11-30 DIAGNOSIS — N2581 Secondary hyperparathyroidism of renal origin: Secondary | ICD-10-CM | POA: Diagnosis not present

## 2017-11-30 DIAGNOSIS — D509 Iron deficiency anemia, unspecified: Secondary | ICD-10-CM | POA: Diagnosis not present

## 2017-11-30 DIAGNOSIS — D631 Anemia in chronic kidney disease: Secondary | ICD-10-CM | POA: Diagnosis not present

## 2017-12-02 DIAGNOSIS — Z992 Dependence on renal dialysis: Secondary | ICD-10-CM | POA: Diagnosis not present

## 2017-12-02 DIAGNOSIS — D631 Anemia in chronic kidney disease: Secondary | ICD-10-CM | POA: Diagnosis not present

## 2017-12-02 DIAGNOSIS — N2581 Secondary hyperparathyroidism of renal origin: Secondary | ICD-10-CM | POA: Diagnosis not present

## 2017-12-02 DIAGNOSIS — N186 End stage renal disease: Secondary | ICD-10-CM | POA: Diagnosis not present

## 2017-12-02 DIAGNOSIS — D509 Iron deficiency anemia, unspecified: Secondary | ICD-10-CM | POA: Diagnosis not present

## 2017-12-03 DIAGNOSIS — Z992 Dependence on renal dialysis: Secondary | ICD-10-CM | POA: Diagnosis not present

## 2017-12-03 DIAGNOSIS — N186 End stage renal disease: Secondary | ICD-10-CM | POA: Diagnosis not present

## 2017-12-05 DIAGNOSIS — D509 Iron deficiency anemia, unspecified: Secondary | ICD-10-CM | POA: Diagnosis not present

## 2017-12-05 DIAGNOSIS — Z992 Dependence on renal dialysis: Secondary | ICD-10-CM | POA: Diagnosis not present

## 2017-12-05 DIAGNOSIS — D631 Anemia in chronic kidney disease: Secondary | ICD-10-CM | POA: Diagnosis not present

## 2017-12-05 DIAGNOSIS — N2581 Secondary hyperparathyroidism of renal origin: Secondary | ICD-10-CM | POA: Diagnosis not present

## 2017-12-05 DIAGNOSIS — N186 End stage renal disease: Secondary | ICD-10-CM | POA: Diagnosis not present

## 2017-12-07 DIAGNOSIS — N2581 Secondary hyperparathyroidism of renal origin: Secondary | ICD-10-CM | POA: Diagnosis not present

## 2017-12-07 DIAGNOSIS — Z992 Dependence on renal dialysis: Secondary | ICD-10-CM | POA: Diagnosis not present

## 2017-12-07 DIAGNOSIS — N186 End stage renal disease: Secondary | ICD-10-CM | POA: Diagnosis not present

## 2017-12-07 DIAGNOSIS — D631 Anemia in chronic kidney disease: Secondary | ICD-10-CM | POA: Diagnosis not present

## 2017-12-07 DIAGNOSIS — D509 Iron deficiency anemia, unspecified: Secondary | ICD-10-CM | POA: Diagnosis not present

## 2017-12-09 DIAGNOSIS — N186 End stage renal disease: Secondary | ICD-10-CM | POA: Diagnosis not present

## 2017-12-09 DIAGNOSIS — D509 Iron deficiency anemia, unspecified: Secondary | ICD-10-CM | POA: Diagnosis not present

## 2017-12-09 DIAGNOSIS — Z992 Dependence on renal dialysis: Secondary | ICD-10-CM | POA: Diagnosis not present

## 2017-12-09 DIAGNOSIS — D631 Anemia in chronic kidney disease: Secondary | ICD-10-CM | POA: Diagnosis not present

## 2017-12-09 DIAGNOSIS — N2581 Secondary hyperparathyroidism of renal origin: Secondary | ICD-10-CM | POA: Diagnosis not present

## 2017-12-12 DIAGNOSIS — D631 Anemia in chronic kidney disease: Secondary | ICD-10-CM | POA: Diagnosis not present

## 2017-12-12 DIAGNOSIS — N2581 Secondary hyperparathyroidism of renal origin: Secondary | ICD-10-CM | POA: Diagnosis not present

## 2017-12-12 DIAGNOSIS — N186 End stage renal disease: Secondary | ICD-10-CM | POA: Diagnosis not present

## 2017-12-12 DIAGNOSIS — D509 Iron deficiency anemia, unspecified: Secondary | ICD-10-CM | POA: Diagnosis not present

## 2017-12-12 DIAGNOSIS — Z992 Dependence on renal dialysis: Secondary | ICD-10-CM | POA: Diagnosis not present

## 2017-12-14 DIAGNOSIS — N2581 Secondary hyperparathyroidism of renal origin: Secondary | ICD-10-CM | POA: Diagnosis not present

## 2017-12-14 DIAGNOSIS — Z992 Dependence on renal dialysis: Secondary | ICD-10-CM | POA: Diagnosis not present

## 2017-12-14 DIAGNOSIS — D509 Iron deficiency anemia, unspecified: Secondary | ICD-10-CM | POA: Diagnosis not present

## 2017-12-14 DIAGNOSIS — D631 Anemia in chronic kidney disease: Secondary | ICD-10-CM | POA: Diagnosis not present

## 2017-12-14 DIAGNOSIS — N186 End stage renal disease: Secondary | ICD-10-CM | POA: Diagnosis not present

## 2017-12-16 DIAGNOSIS — N2581 Secondary hyperparathyroidism of renal origin: Secondary | ICD-10-CM | POA: Diagnosis not present

## 2017-12-16 DIAGNOSIS — D631 Anemia in chronic kidney disease: Secondary | ICD-10-CM | POA: Diagnosis not present

## 2017-12-16 DIAGNOSIS — N186 End stage renal disease: Secondary | ICD-10-CM | POA: Diagnosis not present

## 2017-12-16 DIAGNOSIS — D509 Iron deficiency anemia, unspecified: Secondary | ICD-10-CM | POA: Diagnosis not present

## 2017-12-16 DIAGNOSIS — Z992 Dependence on renal dialysis: Secondary | ICD-10-CM | POA: Diagnosis not present

## 2017-12-19 DIAGNOSIS — D509 Iron deficiency anemia, unspecified: Secondary | ICD-10-CM | POA: Diagnosis not present

## 2017-12-19 DIAGNOSIS — Z992 Dependence on renal dialysis: Secondary | ICD-10-CM | POA: Diagnosis not present

## 2017-12-19 DIAGNOSIS — D631 Anemia in chronic kidney disease: Secondary | ICD-10-CM | POA: Diagnosis not present

## 2017-12-19 DIAGNOSIS — N186 End stage renal disease: Secondary | ICD-10-CM | POA: Diagnosis not present

## 2017-12-19 DIAGNOSIS — N2581 Secondary hyperparathyroidism of renal origin: Secondary | ICD-10-CM | POA: Diagnosis not present

## 2017-12-21 DIAGNOSIS — D509 Iron deficiency anemia, unspecified: Secondary | ICD-10-CM | POA: Diagnosis not present

## 2017-12-21 DIAGNOSIS — D631 Anemia in chronic kidney disease: Secondary | ICD-10-CM | POA: Diagnosis not present

## 2017-12-21 DIAGNOSIS — Z992 Dependence on renal dialysis: Secondary | ICD-10-CM | POA: Diagnosis not present

## 2017-12-21 DIAGNOSIS — N2581 Secondary hyperparathyroidism of renal origin: Secondary | ICD-10-CM | POA: Diagnosis not present

## 2017-12-21 DIAGNOSIS — N186 End stage renal disease: Secondary | ICD-10-CM | POA: Diagnosis not present

## 2017-12-23 DIAGNOSIS — D509 Iron deficiency anemia, unspecified: Secondary | ICD-10-CM | POA: Diagnosis not present

## 2017-12-23 DIAGNOSIS — N186 End stage renal disease: Secondary | ICD-10-CM | POA: Diagnosis not present

## 2017-12-23 DIAGNOSIS — Z992 Dependence on renal dialysis: Secondary | ICD-10-CM | POA: Diagnosis not present

## 2017-12-23 DIAGNOSIS — N2581 Secondary hyperparathyroidism of renal origin: Secondary | ICD-10-CM | POA: Diagnosis not present

## 2017-12-23 DIAGNOSIS — D631 Anemia in chronic kidney disease: Secondary | ICD-10-CM | POA: Diagnosis not present

## 2017-12-26 DIAGNOSIS — Z992 Dependence on renal dialysis: Secondary | ICD-10-CM | POA: Diagnosis not present

## 2017-12-26 DIAGNOSIS — D631 Anemia in chronic kidney disease: Secondary | ICD-10-CM | POA: Diagnosis not present

## 2017-12-26 DIAGNOSIS — N2581 Secondary hyperparathyroidism of renal origin: Secondary | ICD-10-CM | POA: Diagnosis not present

## 2017-12-26 DIAGNOSIS — N186 End stage renal disease: Secondary | ICD-10-CM | POA: Diagnosis not present

## 2017-12-26 DIAGNOSIS — D509 Iron deficiency anemia, unspecified: Secondary | ICD-10-CM | POA: Diagnosis not present

## 2017-12-30 DIAGNOSIS — D509 Iron deficiency anemia, unspecified: Secondary | ICD-10-CM | POA: Diagnosis not present

## 2017-12-30 DIAGNOSIS — Z992 Dependence on renal dialysis: Secondary | ICD-10-CM | POA: Diagnosis not present

## 2017-12-30 DIAGNOSIS — D631 Anemia in chronic kidney disease: Secondary | ICD-10-CM | POA: Diagnosis not present

## 2017-12-30 DIAGNOSIS — N186 End stage renal disease: Secondary | ICD-10-CM | POA: Diagnosis not present

## 2017-12-30 DIAGNOSIS — N2581 Secondary hyperparathyroidism of renal origin: Secondary | ICD-10-CM | POA: Diagnosis not present

## 2018-01-02 DIAGNOSIS — N2581 Secondary hyperparathyroidism of renal origin: Secondary | ICD-10-CM | POA: Diagnosis not present

## 2018-01-02 DIAGNOSIS — Z992 Dependence on renal dialysis: Secondary | ICD-10-CM | POA: Diagnosis not present

## 2018-01-02 DIAGNOSIS — N186 End stage renal disease: Secondary | ICD-10-CM | POA: Diagnosis not present

## 2018-01-02 DIAGNOSIS — D631 Anemia in chronic kidney disease: Secondary | ICD-10-CM | POA: Diagnosis not present

## 2018-01-02 DIAGNOSIS — D509 Iron deficiency anemia, unspecified: Secondary | ICD-10-CM | POA: Diagnosis not present

## 2018-01-03 DIAGNOSIS — N186 End stage renal disease: Secondary | ICD-10-CM | POA: Diagnosis not present

## 2018-01-03 DIAGNOSIS — Z992 Dependence on renal dialysis: Secondary | ICD-10-CM | POA: Diagnosis not present

## 2018-01-04 DIAGNOSIS — D509 Iron deficiency anemia, unspecified: Secondary | ICD-10-CM | POA: Diagnosis not present

## 2018-01-04 DIAGNOSIS — D631 Anemia in chronic kidney disease: Secondary | ICD-10-CM | POA: Diagnosis not present

## 2018-01-04 DIAGNOSIS — Z992 Dependence on renal dialysis: Secondary | ICD-10-CM | POA: Diagnosis not present

## 2018-01-04 DIAGNOSIS — N186 End stage renal disease: Secondary | ICD-10-CM | POA: Diagnosis not present

## 2018-01-04 DIAGNOSIS — N2581 Secondary hyperparathyroidism of renal origin: Secondary | ICD-10-CM | POA: Diagnosis not present

## 2018-01-06 DIAGNOSIS — N2581 Secondary hyperparathyroidism of renal origin: Secondary | ICD-10-CM | POA: Diagnosis not present

## 2018-01-06 DIAGNOSIS — D509 Iron deficiency anemia, unspecified: Secondary | ICD-10-CM | POA: Diagnosis not present

## 2018-01-06 DIAGNOSIS — D631 Anemia in chronic kidney disease: Secondary | ICD-10-CM | POA: Diagnosis not present

## 2018-01-06 DIAGNOSIS — Z992 Dependence on renal dialysis: Secondary | ICD-10-CM | POA: Diagnosis not present

## 2018-01-06 DIAGNOSIS — N186 End stage renal disease: Secondary | ICD-10-CM | POA: Diagnosis not present

## 2018-01-09 DIAGNOSIS — D509 Iron deficiency anemia, unspecified: Secondary | ICD-10-CM | POA: Diagnosis not present

## 2018-01-09 DIAGNOSIS — D631 Anemia in chronic kidney disease: Secondary | ICD-10-CM | POA: Diagnosis not present

## 2018-01-09 DIAGNOSIS — N186 End stage renal disease: Secondary | ICD-10-CM | POA: Diagnosis not present

## 2018-01-09 DIAGNOSIS — Z992 Dependence on renal dialysis: Secondary | ICD-10-CM | POA: Diagnosis not present

## 2018-01-09 DIAGNOSIS — N2581 Secondary hyperparathyroidism of renal origin: Secondary | ICD-10-CM | POA: Diagnosis not present

## 2018-01-11 DIAGNOSIS — N186 End stage renal disease: Secondary | ICD-10-CM | POA: Diagnosis not present

## 2018-01-11 DIAGNOSIS — N2581 Secondary hyperparathyroidism of renal origin: Secondary | ICD-10-CM | POA: Diagnosis not present

## 2018-01-11 DIAGNOSIS — D509 Iron deficiency anemia, unspecified: Secondary | ICD-10-CM | POA: Diagnosis not present

## 2018-01-11 DIAGNOSIS — D631 Anemia in chronic kidney disease: Secondary | ICD-10-CM | POA: Diagnosis not present

## 2018-01-11 DIAGNOSIS — Z992 Dependence on renal dialysis: Secondary | ICD-10-CM | POA: Diagnosis not present

## 2018-01-13 DIAGNOSIS — D631 Anemia in chronic kidney disease: Secondary | ICD-10-CM | POA: Diagnosis not present

## 2018-01-13 DIAGNOSIS — N2581 Secondary hyperparathyroidism of renal origin: Secondary | ICD-10-CM | POA: Diagnosis not present

## 2018-01-13 DIAGNOSIS — N186 End stage renal disease: Secondary | ICD-10-CM | POA: Diagnosis not present

## 2018-01-13 DIAGNOSIS — D509 Iron deficiency anemia, unspecified: Secondary | ICD-10-CM | POA: Diagnosis not present

## 2018-01-13 DIAGNOSIS — Z992 Dependence on renal dialysis: Secondary | ICD-10-CM | POA: Diagnosis not present

## 2018-01-16 DIAGNOSIS — N186 End stage renal disease: Secondary | ICD-10-CM | POA: Diagnosis not present

## 2018-01-16 DIAGNOSIS — E119 Type 2 diabetes mellitus without complications: Secondary | ICD-10-CM | POA: Diagnosis not present

## 2018-01-16 DIAGNOSIS — D631 Anemia in chronic kidney disease: Secondary | ICD-10-CM | POA: Diagnosis not present

## 2018-01-16 DIAGNOSIS — Z992 Dependence on renal dialysis: Secondary | ICD-10-CM | POA: Diagnosis not present

## 2018-01-16 DIAGNOSIS — N2581 Secondary hyperparathyroidism of renal origin: Secondary | ICD-10-CM | POA: Diagnosis not present

## 2018-01-16 DIAGNOSIS — D509 Iron deficiency anemia, unspecified: Secondary | ICD-10-CM | POA: Diagnosis not present

## 2018-01-18 DIAGNOSIS — N2581 Secondary hyperparathyroidism of renal origin: Secondary | ICD-10-CM | POA: Diagnosis not present

## 2018-01-18 DIAGNOSIS — D509 Iron deficiency anemia, unspecified: Secondary | ICD-10-CM | POA: Diagnosis not present

## 2018-01-18 DIAGNOSIS — D631 Anemia in chronic kidney disease: Secondary | ICD-10-CM | POA: Diagnosis not present

## 2018-01-18 DIAGNOSIS — Z992 Dependence on renal dialysis: Secondary | ICD-10-CM | POA: Diagnosis not present

## 2018-01-18 DIAGNOSIS — N186 End stage renal disease: Secondary | ICD-10-CM | POA: Diagnosis not present

## 2018-01-20 DIAGNOSIS — Z992 Dependence on renal dialysis: Secondary | ICD-10-CM | POA: Diagnosis not present

## 2018-01-20 DIAGNOSIS — D509 Iron deficiency anemia, unspecified: Secondary | ICD-10-CM | POA: Diagnosis not present

## 2018-01-20 DIAGNOSIS — D631 Anemia in chronic kidney disease: Secondary | ICD-10-CM | POA: Diagnosis not present

## 2018-01-20 DIAGNOSIS — N2581 Secondary hyperparathyroidism of renal origin: Secondary | ICD-10-CM | POA: Diagnosis not present

## 2018-01-20 DIAGNOSIS — N186 End stage renal disease: Secondary | ICD-10-CM | POA: Diagnosis not present

## 2018-01-23 DIAGNOSIS — N2581 Secondary hyperparathyroidism of renal origin: Secondary | ICD-10-CM | POA: Diagnosis not present

## 2018-01-23 DIAGNOSIS — D631 Anemia in chronic kidney disease: Secondary | ICD-10-CM | POA: Diagnosis not present

## 2018-01-23 DIAGNOSIS — N186 End stage renal disease: Secondary | ICD-10-CM | POA: Diagnosis not present

## 2018-01-23 DIAGNOSIS — Z992 Dependence on renal dialysis: Secondary | ICD-10-CM | POA: Diagnosis not present

## 2018-01-23 DIAGNOSIS — D509 Iron deficiency anemia, unspecified: Secondary | ICD-10-CM | POA: Diagnosis not present

## 2018-01-25 DIAGNOSIS — D509 Iron deficiency anemia, unspecified: Secondary | ICD-10-CM | POA: Diagnosis not present

## 2018-01-25 DIAGNOSIS — Z992 Dependence on renal dialysis: Secondary | ICD-10-CM | POA: Diagnosis not present

## 2018-01-25 DIAGNOSIS — N186 End stage renal disease: Secondary | ICD-10-CM | POA: Diagnosis not present

## 2018-01-25 DIAGNOSIS — D631 Anemia in chronic kidney disease: Secondary | ICD-10-CM | POA: Diagnosis not present

## 2018-01-25 DIAGNOSIS — N2581 Secondary hyperparathyroidism of renal origin: Secondary | ICD-10-CM | POA: Diagnosis not present

## 2018-01-27 DIAGNOSIS — D509 Iron deficiency anemia, unspecified: Secondary | ICD-10-CM | POA: Diagnosis not present

## 2018-01-27 DIAGNOSIS — N186 End stage renal disease: Secondary | ICD-10-CM | POA: Diagnosis not present

## 2018-01-27 DIAGNOSIS — Z992 Dependence on renal dialysis: Secondary | ICD-10-CM | POA: Diagnosis not present

## 2018-01-27 DIAGNOSIS — N2581 Secondary hyperparathyroidism of renal origin: Secondary | ICD-10-CM | POA: Diagnosis not present

## 2018-01-27 DIAGNOSIS — D631 Anemia in chronic kidney disease: Secondary | ICD-10-CM | POA: Diagnosis not present

## 2018-01-30 DIAGNOSIS — N2581 Secondary hyperparathyroidism of renal origin: Secondary | ICD-10-CM | POA: Diagnosis not present

## 2018-01-30 DIAGNOSIS — Z992 Dependence on renal dialysis: Secondary | ICD-10-CM | POA: Diagnosis not present

## 2018-01-30 DIAGNOSIS — D631 Anemia in chronic kidney disease: Secondary | ICD-10-CM | POA: Diagnosis not present

## 2018-01-30 DIAGNOSIS — D509 Iron deficiency anemia, unspecified: Secondary | ICD-10-CM | POA: Diagnosis not present

## 2018-01-30 DIAGNOSIS — N186 End stage renal disease: Secondary | ICD-10-CM | POA: Diagnosis not present

## 2018-02-01 DIAGNOSIS — N2581 Secondary hyperparathyroidism of renal origin: Secondary | ICD-10-CM | POA: Diagnosis not present

## 2018-02-01 DIAGNOSIS — D631 Anemia in chronic kidney disease: Secondary | ICD-10-CM | POA: Diagnosis not present

## 2018-02-01 DIAGNOSIS — Z992 Dependence on renal dialysis: Secondary | ICD-10-CM | POA: Diagnosis not present

## 2018-02-01 DIAGNOSIS — N186 End stage renal disease: Secondary | ICD-10-CM | POA: Diagnosis not present

## 2018-02-01 DIAGNOSIS — D509 Iron deficiency anemia, unspecified: Secondary | ICD-10-CM | POA: Diagnosis not present

## 2018-02-03 DIAGNOSIS — D509 Iron deficiency anemia, unspecified: Secondary | ICD-10-CM | POA: Diagnosis not present

## 2018-02-03 DIAGNOSIS — N186 End stage renal disease: Secondary | ICD-10-CM | POA: Diagnosis not present

## 2018-02-03 DIAGNOSIS — D631 Anemia in chronic kidney disease: Secondary | ICD-10-CM | POA: Diagnosis not present

## 2018-02-03 DIAGNOSIS — Z992 Dependence on renal dialysis: Secondary | ICD-10-CM | POA: Diagnosis not present

## 2018-02-03 DIAGNOSIS — N2581 Secondary hyperparathyroidism of renal origin: Secondary | ICD-10-CM | POA: Diagnosis not present

## 2018-02-06 DIAGNOSIS — D509 Iron deficiency anemia, unspecified: Secondary | ICD-10-CM | POA: Diagnosis not present

## 2018-02-06 DIAGNOSIS — N2581 Secondary hyperparathyroidism of renal origin: Secondary | ICD-10-CM | POA: Diagnosis not present

## 2018-02-06 DIAGNOSIS — Z992 Dependence on renal dialysis: Secondary | ICD-10-CM | POA: Diagnosis not present

## 2018-02-06 DIAGNOSIS — D631 Anemia in chronic kidney disease: Secondary | ICD-10-CM | POA: Diagnosis not present

## 2018-02-06 DIAGNOSIS — N186 End stage renal disease: Secondary | ICD-10-CM | POA: Diagnosis not present

## 2018-02-08 DIAGNOSIS — D631 Anemia in chronic kidney disease: Secondary | ICD-10-CM | POA: Diagnosis not present

## 2018-02-08 DIAGNOSIS — N186 End stage renal disease: Secondary | ICD-10-CM | POA: Diagnosis not present

## 2018-02-08 DIAGNOSIS — D509 Iron deficiency anemia, unspecified: Secondary | ICD-10-CM | POA: Diagnosis not present

## 2018-02-08 DIAGNOSIS — N2581 Secondary hyperparathyroidism of renal origin: Secondary | ICD-10-CM | POA: Diagnosis not present

## 2018-02-08 DIAGNOSIS — Z992 Dependence on renal dialysis: Secondary | ICD-10-CM | POA: Diagnosis not present

## 2018-02-10 DIAGNOSIS — Z992 Dependence on renal dialysis: Secondary | ICD-10-CM | POA: Diagnosis not present

## 2018-02-10 DIAGNOSIS — D509 Iron deficiency anemia, unspecified: Secondary | ICD-10-CM | POA: Diagnosis not present

## 2018-02-10 DIAGNOSIS — N2581 Secondary hyperparathyroidism of renal origin: Secondary | ICD-10-CM | POA: Diagnosis not present

## 2018-02-10 DIAGNOSIS — N186 End stage renal disease: Secondary | ICD-10-CM | POA: Diagnosis not present

## 2018-02-10 DIAGNOSIS — D631 Anemia in chronic kidney disease: Secondary | ICD-10-CM | POA: Diagnosis not present

## 2018-02-13 DIAGNOSIS — N186 End stage renal disease: Secondary | ICD-10-CM | POA: Diagnosis not present

## 2018-02-13 DIAGNOSIS — N2581 Secondary hyperparathyroidism of renal origin: Secondary | ICD-10-CM | POA: Diagnosis not present

## 2018-02-13 DIAGNOSIS — Z992 Dependence on renal dialysis: Secondary | ICD-10-CM | POA: Diagnosis not present

## 2018-02-13 DIAGNOSIS — D509 Iron deficiency anemia, unspecified: Secondary | ICD-10-CM | POA: Diagnosis not present

## 2018-02-13 DIAGNOSIS — D631 Anemia in chronic kidney disease: Secondary | ICD-10-CM | POA: Diagnosis not present

## 2018-02-15 DIAGNOSIS — Z992 Dependence on renal dialysis: Secondary | ICD-10-CM | POA: Diagnosis not present

## 2018-02-15 DIAGNOSIS — N186 End stage renal disease: Secondary | ICD-10-CM | POA: Diagnosis not present

## 2018-02-15 DIAGNOSIS — N2581 Secondary hyperparathyroidism of renal origin: Secondary | ICD-10-CM | POA: Diagnosis not present

## 2018-02-15 DIAGNOSIS — D631 Anemia in chronic kidney disease: Secondary | ICD-10-CM | POA: Diagnosis not present

## 2018-02-15 DIAGNOSIS — D509 Iron deficiency anemia, unspecified: Secondary | ICD-10-CM | POA: Diagnosis not present

## 2018-02-17 DIAGNOSIS — D509 Iron deficiency anemia, unspecified: Secondary | ICD-10-CM | POA: Diagnosis not present

## 2018-02-17 DIAGNOSIS — N2581 Secondary hyperparathyroidism of renal origin: Secondary | ICD-10-CM | POA: Diagnosis not present

## 2018-02-17 DIAGNOSIS — N186 End stage renal disease: Secondary | ICD-10-CM | POA: Diagnosis not present

## 2018-02-17 DIAGNOSIS — D631 Anemia in chronic kidney disease: Secondary | ICD-10-CM | POA: Diagnosis not present

## 2018-02-17 DIAGNOSIS — Z992 Dependence on renal dialysis: Secondary | ICD-10-CM | POA: Diagnosis not present

## 2018-02-20 DIAGNOSIS — N2581 Secondary hyperparathyroidism of renal origin: Secondary | ICD-10-CM | POA: Diagnosis not present

## 2018-02-20 DIAGNOSIS — D631 Anemia in chronic kidney disease: Secondary | ICD-10-CM | POA: Diagnosis not present

## 2018-02-20 DIAGNOSIS — N186 End stage renal disease: Secondary | ICD-10-CM | POA: Diagnosis not present

## 2018-02-20 DIAGNOSIS — Z992 Dependence on renal dialysis: Secondary | ICD-10-CM | POA: Diagnosis not present

## 2018-02-20 DIAGNOSIS — D509 Iron deficiency anemia, unspecified: Secondary | ICD-10-CM | POA: Diagnosis not present

## 2018-02-22 DIAGNOSIS — D509 Iron deficiency anemia, unspecified: Secondary | ICD-10-CM | POA: Diagnosis not present

## 2018-02-22 DIAGNOSIS — Z992 Dependence on renal dialysis: Secondary | ICD-10-CM | POA: Diagnosis not present

## 2018-02-22 DIAGNOSIS — N186 End stage renal disease: Secondary | ICD-10-CM | POA: Diagnosis not present

## 2018-02-22 DIAGNOSIS — N2581 Secondary hyperparathyroidism of renal origin: Secondary | ICD-10-CM | POA: Diagnosis not present

## 2018-02-22 DIAGNOSIS — D631 Anemia in chronic kidney disease: Secondary | ICD-10-CM | POA: Diagnosis not present

## 2018-02-24 DIAGNOSIS — N186 End stage renal disease: Secondary | ICD-10-CM | POA: Diagnosis not present

## 2018-02-24 DIAGNOSIS — Z992 Dependence on renal dialysis: Secondary | ICD-10-CM | POA: Diagnosis not present

## 2018-02-24 DIAGNOSIS — D509 Iron deficiency anemia, unspecified: Secondary | ICD-10-CM | POA: Diagnosis not present

## 2018-02-24 DIAGNOSIS — D631 Anemia in chronic kidney disease: Secondary | ICD-10-CM | POA: Diagnosis not present

## 2018-02-24 DIAGNOSIS — N2581 Secondary hyperparathyroidism of renal origin: Secondary | ICD-10-CM | POA: Diagnosis not present

## 2018-02-27 DIAGNOSIS — N186 End stage renal disease: Secondary | ICD-10-CM | POA: Diagnosis not present

## 2018-02-27 DIAGNOSIS — Z992 Dependence on renal dialysis: Secondary | ICD-10-CM | POA: Diagnosis not present

## 2018-02-27 DIAGNOSIS — D631 Anemia in chronic kidney disease: Secondary | ICD-10-CM | POA: Diagnosis not present

## 2018-02-27 DIAGNOSIS — D509 Iron deficiency anemia, unspecified: Secondary | ICD-10-CM | POA: Diagnosis not present

## 2018-02-27 DIAGNOSIS — N2581 Secondary hyperparathyroidism of renal origin: Secondary | ICD-10-CM | POA: Diagnosis not present

## 2018-03-01 DIAGNOSIS — D509 Iron deficiency anemia, unspecified: Secondary | ICD-10-CM | POA: Diagnosis not present

## 2018-03-01 DIAGNOSIS — N186 End stage renal disease: Secondary | ICD-10-CM | POA: Diagnosis not present

## 2018-03-01 DIAGNOSIS — N2581 Secondary hyperparathyroidism of renal origin: Secondary | ICD-10-CM | POA: Diagnosis not present

## 2018-03-01 DIAGNOSIS — D631 Anemia in chronic kidney disease: Secondary | ICD-10-CM | POA: Diagnosis not present

## 2018-03-01 DIAGNOSIS — Z992 Dependence on renal dialysis: Secondary | ICD-10-CM | POA: Diagnosis not present

## 2018-03-03 DIAGNOSIS — D631 Anemia in chronic kidney disease: Secondary | ICD-10-CM | POA: Diagnosis not present

## 2018-03-03 DIAGNOSIS — N2581 Secondary hyperparathyroidism of renal origin: Secondary | ICD-10-CM | POA: Diagnosis not present

## 2018-03-03 DIAGNOSIS — D509 Iron deficiency anemia, unspecified: Secondary | ICD-10-CM | POA: Diagnosis not present

## 2018-03-03 DIAGNOSIS — N186 End stage renal disease: Secondary | ICD-10-CM | POA: Diagnosis not present

## 2018-03-03 DIAGNOSIS — Z992 Dependence on renal dialysis: Secondary | ICD-10-CM | POA: Diagnosis not present

## 2018-03-06 DIAGNOSIS — Z992 Dependence on renal dialysis: Secondary | ICD-10-CM | POA: Diagnosis not present

## 2018-03-06 DIAGNOSIS — N2581 Secondary hyperparathyroidism of renal origin: Secondary | ICD-10-CM | POA: Diagnosis not present

## 2018-03-06 DIAGNOSIS — N186 End stage renal disease: Secondary | ICD-10-CM | POA: Diagnosis not present

## 2018-03-06 DIAGNOSIS — D509 Iron deficiency anemia, unspecified: Secondary | ICD-10-CM | POA: Diagnosis not present

## 2018-03-06 DIAGNOSIS — D631 Anemia in chronic kidney disease: Secondary | ICD-10-CM | POA: Diagnosis not present

## 2018-03-08 DIAGNOSIS — N186 End stage renal disease: Secondary | ICD-10-CM | POA: Diagnosis not present

## 2018-03-08 DIAGNOSIS — N2581 Secondary hyperparathyroidism of renal origin: Secondary | ICD-10-CM | POA: Diagnosis not present

## 2018-03-08 DIAGNOSIS — D631 Anemia in chronic kidney disease: Secondary | ICD-10-CM | POA: Diagnosis not present

## 2018-03-08 DIAGNOSIS — D509 Iron deficiency anemia, unspecified: Secondary | ICD-10-CM | POA: Diagnosis not present

## 2018-03-08 DIAGNOSIS — Z992 Dependence on renal dialysis: Secondary | ICD-10-CM | POA: Diagnosis not present

## 2018-03-10 DIAGNOSIS — N2581 Secondary hyperparathyroidism of renal origin: Secondary | ICD-10-CM | POA: Diagnosis not present

## 2018-03-10 DIAGNOSIS — D631 Anemia in chronic kidney disease: Secondary | ICD-10-CM | POA: Diagnosis not present

## 2018-03-10 DIAGNOSIS — Z992 Dependence on renal dialysis: Secondary | ICD-10-CM | POA: Diagnosis not present

## 2018-03-10 DIAGNOSIS — D509 Iron deficiency anemia, unspecified: Secondary | ICD-10-CM | POA: Diagnosis not present

## 2018-03-10 DIAGNOSIS — N186 End stage renal disease: Secondary | ICD-10-CM | POA: Diagnosis not present

## 2018-03-13 DIAGNOSIS — Z992 Dependence on renal dialysis: Secondary | ICD-10-CM | POA: Diagnosis not present

## 2018-03-13 DIAGNOSIS — D509 Iron deficiency anemia, unspecified: Secondary | ICD-10-CM | POA: Diagnosis not present

## 2018-03-13 DIAGNOSIS — D631 Anemia in chronic kidney disease: Secondary | ICD-10-CM | POA: Diagnosis not present

## 2018-03-13 DIAGNOSIS — N2581 Secondary hyperparathyroidism of renal origin: Secondary | ICD-10-CM | POA: Diagnosis not present

## 2018-03-13 DIAGNOSIS — N186 End stage renal disease: Secondary | ICD-10-CM | POA: Diagnosis not present

## 2018-03-15 DIAGNOSIS — D509 Iron deficiency anemia, unspecified: Secondary | ICD-10-CM | POA: Diagnosis not present

## 2018-03-15 DIAGNOSIS — D631 Anemia in chronic kidney disease: Secondary | ICD-10-CM | POA: Diagnosis not present

## 2018-03-15 DIAGNOSIS — N186 End stage renal disease: Secondary | ICD-10-CM | POA: Diagnosis not present

## 2018-03-15 DIAGNOSIS — N2581 Secondary hyperparathyroidism of renal origin: Secondary | ICD-10-CM | POA: Diagnosis not present

## 2018-03-15 DIAGNOSIS — Z992 Dependence on renal dialysis: Secondary | ICD-10-CM | POA: Diagnosis not present

## 2018-03-17 DIAGNOSIS — N2581 Secondary hyperparathyroidism of renal origin: Secondary | ICD-10-CM | POA: Diagnosis not present

## 2018-03-17 DIAGNOSIS — D631 Anemia in chronic kidney disease: Secondary | ICD-10-CM | POA: Diagnosis not present

## 2018-03-17 DIAGNOSIS — Z992 Dependence on renal dialysis: Secondary | ICD-10-CM | POA: Diagnosis not present

## 2018-03-17 DIAGNOSIS — D509 Iron deficiency anemia, unspecified: Secondary | ICD-10-CM | POA: Diagnosis not present

## 2018-03-17 DIAGNOSIS — N186 End stage renal disease: Secondary | ICD-10-CM | POA: Diagnosis not present

## 2018-03-20 DIAGNOSIS — D509 Iron deficiency anemia, unspecified: Secondary | ICD-10-CM | POA: Diagnosis not present

## 2018-03-20 DIAGNOSIS — D631 Anemia in chronic kidney disease: Secondary | ICD-10-CM | POA: Diagnosis not present

## 2018-03-20 DIAGNOSIS — Z992 Dependence on renal dialysis: Secondary | ICD-10-CM | POA: Diagnosis not present

## 2018-03-20 DIAGNOSIS — N186 End stage renal disease: Secondary | ICD-10-CM | POA: Diagnosis not present

## 2018-03-20 DIAGNOSIS — N2581 Secondary hyperparathyroidism of renal origin: Secondary | ICD-10-CM | POA: Diagnosis not present

## 2018-03-22 DIAGNOSIS — Z992 Dependence on renal dialysis: Secondary | ICD-10-CM | POA: Diagnosis not present

## 2018-03-22 DIAGNOSIS — D509 Iron deficiency anemia, unspecified: Secondary | ICD-10-CM | POA: Diagnosis not present

## 2018-03-22 DIAGNOSIS — D631 Anemia in chronic kidney disease: Secondary | ICD-10-CM | POA: Diagnosis not present

## 2018-03-22 DIAGNOSIS — N186 End stage renal disease: Secondary | ICD-10-CM | POA: Diagnosis not present

## 2018-03-22 DIAGNOSIS — N2581 Secondary hyperparathyroidism of renal origin: Secondary | ICD-10-CM | POA: Diagnosis not present

## 2018-03-24 DIAGNOSIS — N186 End stage renal disease: Secondary | ICD-10-CM | POA: Diagnosis not present

## 2018-03-24 DIAGNOSIS — D631 Anemia in chronic kidney disease: Secondary | ICD-10-CM | POA: Diagnosis not present

## 2018-03-24 DIAGNOSIS — N2581 Secondary hyperparathyroidism of renal origin: Secondary | ICD-10-CM | POA: Diagnosis not present

## 2018-03-24 DIAGNOSIS — D509 Iron deficiency anemia, unspecified: Secondary | ICD-10-CM | POA: Diagnosis not present

## 2018-03-24 DIAGNOSIS — Z992 Dependence on renal dialysis: Secondary | ICD-10-CM | POA: Diagnosis not present

## 2018-03-27 DIAGNOSIS — D509 Iron deficiency anemia, unspecified: Secondary | ICD-10-CM | POA: Diagnosis not present

## 2018-03-27 DIAGNOSIS — N2581 Secondary hyperparathyroidism of renal origin: Secondary | ICD-10-CM | POA: Diagnosis not present

## 2018-03-27 DIAGNOSIS — N186 End stage renal disease: Secondary | ICD-10-CM | POA: Diagnosis not present

## 2018-03-27 DIAGNOSIS — D631 Anemia in chronic kidney disease: Secondary | ICD-10-CM | POA: Diagnosis not present

## 2018-03-27 DIAGNOSIS — Z992 Dependence on renal dialysis: Secondary | ICD-10-CM | POA: Diagnosis not present

## 2018-03-29 DIAGNOSIS — N2581 Secondary hyperparathyroidism of renal origin: Secondary | ICD-10-CM | POA: Diagnosis not present

## 2018-03-29 DIAGNOSIS — N186 End stage renal disease: Secondary | ICD-10-CM | POA: Diagnosis not present

## 2018-03-29 DIAGNOSIS — D631 Anemia in chronic kidney disease: Secondary | ICD-10-CM | POA: Diagnosis not present

## 2018-03-29 DIAGNOSIS — D509 Iron deficiency anemia, unspecified: Secondary | ICD-10-CM | POA: Diagnosis not present

## 2018-03-29 DIAGNOSIS — Z992 Dependence on renal dialysis: Secondary | ICD-10-CM | POA: Diagnosis not present

## 2018-03-30 ENCOUNTER — Other Ambulatory Visit: Payer: Self-pay

## 2018-03-30 MED ORDER — "INSULIN SYRINGE-NEEDLE U-100 31G X 15/64"" 0.3 ML MISC": 0 refills | Status: DC

## 2018-03-31 DIAGNOSIS — Z992 Dependence on renal dialysis: Secondary | ICD-10-CM | POA: Diagnosis not present

## 2018-03-31 DIAGNOSIS — D631 Anemia in chronic kidney disease: Secondary | ICD-10-CM | POA: Diagnosis not present

## 2018-03-31 DIAGNOSIS — N2581 Secondary hyperparathyroidism of renal origin: Secondary | ICD-10-CM | POA: Diagnosis not present

## 2018-03-31 DIAGNOSIS — N186 End stage renal disease: Secondary | ICD-10-CM | POA: Diagnosis not present

## 2018-03-31 DIAGNOSIS — D509 Iron deficiency anemia, unspecified: Secondary | ICD-10-CM | POA: Diagnosis not present

## 2018-04-03 DIAGNOSIS — D631 Anemia in chronic kidney disease: Secondary | ICD-10-CM | POA: Diagnosis not present

## 2018-04-03 DIAGNOSIS — Z992 Dependence on renal dialysis: Secondary | ICD-10-CM | POA: Diagnosis not present

## 2018-04-03 DIAGNOSIS — D509 Iron deficiency anemia, unspecified: Secondary | ICD-10-CM | POA: Diagnosis not present

## 2018-04-03 DIAGNOSIS — N2581 Secondary hyperparathyroidism of renal origin: Secondary | ICD-10-CM | POA: Diagnosis not present

## 2018-04-03 DIAGNOSIS — N186 End stage renal disease: Secondary | ICD-10-CM | POA: Diagnosis not present

## 2018-04-04 DIAGNOSIS — N186 End stage renal disease: Secondary | ICD-10-CM | POA: Diagnosis not present

## 2018-04-04 DIAGNOSIS — Z992 Dependence on renal dialysis: Secondary | ICD-10-CM | POA: Diagnosis not present

## 2018-04-05 DIAGNOSIS — D509 Iron deficiency anemia, unspecified: Secondary | ICD-10-CM | POA: Diagnosis not present

## 2018-04-05 DIAGNOSIS — N186 End stage renal disease: Secondary | ICD-10-CM | POA: Diagnosis not present

## 2018-04-05 DIAGNOSIS — Z992 Dependence on renal dialysis: Secondary | ICD-10-CM | POA: Diagnosis not present

## 2018-04-05 DIAGNOSIS — N2581 Secondary hyperparathyroidism of renal origin: Secondary | ICD-10-CM | POA: Diagnosis not present

## 2018-04-05 DIAGNOSIS — D631 Anemia in chronic kidney disease: Secondary | ICD-10-CM | POA: Diagnosis not present

## 2018-04-07 DIAGNOSIS — D631 Anemia in chronic kidney disease: Secondary | ICD-10-CM | POA: Diagnosis not present

## 2018-04-07 DIAGNOSIS — N2581 Secondary hyperparathyroidism of renal origin: Secondary | ICD-10-CM | POA: Diagnosis not present

## 2018-04-07 DIAGNOSIS — N186 End stage renal disease: Secondary | ICD-10-CM | POA: Diagnosis not present

## 2018-04-07 DIAGNOSIS — D509 Iron deficiency anemia, unspecified: Secondary | ICD-10-CM | POA: Diagnosis not present

## 2018-04-07 DIAGNOSIS — Z992 Dependence on renal dialysis: Secondary | ICD-10-CM | POA: Diagnosis not present

## 2018-04-10 DIAGNOSIS — D631 Anemia in chronic kidney disease: Secondary | ICD-10-CM | POA: Diagnosis not present

## 2018-04-10 DIAGNOSIS — Z992 Dependence on renal dialysis: Secondary | ICD-10-CM | POA: Diagnosis not present

## 2018-04-10 DIAGNOSIS — N186 End stage renal disease: Secondary | ICD-10-CM | POA: Diagnosis not present

## 2018-04-10 DIAGNOSIS — D509 Iron deficiency anemia, unspecified: Secondary | ICD-10-CM | POA: Diagnosis not present

## 2018-04-10 DIAGNOSIS — N2581 Secondary hyperparathyroidism of renal origin: Secondary | ICD-10-CM | POA: Diagnosis not present

## 2018-04-12 DIAGNOSIS — N186 End stage renal disease: Secondary | ICD-10-CM | POA: Diagnosis not present

## 2018-04-12 DIAGNOSIS — N2581 Secondary hyperparathyroidism of renal origin: Secondary | ICD-10-CM | POA: Diagnosis not present

## 2018-04-12 DIAGNOSIS — D631 Anemia in chronic kidney disease: Secondary | ICD-10-CM | POA: Diagnosis not present

## 2018-04-12 DIAGNOSIS — Z992 Dependence on renal dialysis: Secondary | ICD-10-CM | POA: Diagnosis not present

## 2018-04-12 DIAGNOSIS — D509 Iron deficiency anemia, unspecified: Secondary | ICD-10-CM | POA: Diagnosis not present

## 2018-04-14 DIAGNOSIS — D631 Anemia in chronic kidney disease: Secondary | ICD-10-CM | POA: Diagnosis not present

## 2018-04-14 DIAGNOSIS — Z992 Dependence on renal dialysis: Secondary | ICD-10-CM | POA: Diagnosis not present

## 2018-04-14 DIAGNOSIS — N186 End stage renal disease: Secondary | ICD-10-CM | POA: Diagnosis not present

## 2018-04-14 DIAGNOSIS — N2581 Secondary hyperparathyroidism of renal origin: Secondary | ICD-10-CM | POA: Diagnosis not present

## 2018-04-14 DIAGNOSIS — D509 Iron deficiency anemia, unspecified: Secondary | ICD-10-CM | POA: Diagnosis not present

## 2018-04-17 DIAGNOSIS — E119 Type 2 diabetes mellitus without complications: Secondary | ICD-10-CM | POA: Diagnosis not present

## 2018-04-17 DIAGNOSIS — D631 Anemia in chronic kidney disease: Secondary | ICD-10-CM | POA: Diagnosis not present

## 2018-04-17 DIAGNOSIS — D509 Iron deficiency anemia, unspecified: Secondary | ICD-10-CM | POA: Diagnosis not present

## 2018-04-17 DIAGNOSIS — Z992 Dependence on renal dialysis: Secondary | ICD-10-CM | POA: Diagnosis not present

## 2018-04-17 DIAGNOSIS — N2581 Secondary hyperparathyroidism of renal origin: Secondary | ICD-10-CM | POA: Diagnosis not present

## 2018-04-17 DIAGNOSIS — N186 End stage renal disease: Secondary | ICD-10-CM | POA: Diagnosis not present

## 2018-04-19 DIAGNOSIS — N2581 Secondary hyperparathyroidism of renal origin: Secondary | ICD-10-CM | POA: Diagnosis not present

## 2018-04-19 DIAGNOSIS — D509 Iron deficiency anemia, unspecified: Secondary | ICD-10-CM | POA: Diagnosis not present

## 2018-04-19 DIAGNOSIS — Z992 Dependence on renal dialysis: Secondary | ICD-10-CM | POA: Diagnosis not present

## 2018-04-19 DIAGNOSIS — N186 End stage renal disease: Secondary | ICD-10-CM | POA: Diagnosis not present

## 2018-04-19 DIAGNOSIS — D631 Anemia in chronic kidney disease: Secondary | ICD-10-CM | POA: Diagnosis not present

## 2018-04-21 DIAGNOSIS — D509 Iron deficiency anemia, unspecified: Secondary | ICD-10-CM | POA: Diagnosis not present

## 2018-04-21 DIAGNOSIS — N2581 Secondary hyperparathyroidism of renal origin: Secondary | ICD-10-CM | POA: Diagnosis not present

## 2018-04-21 DIAGNOSIS — N186 End stage renal disease: Secondary | ICD-10-CM | POA: Diagnosis not present

## 2018-04-21 DIAGNOSIS — Z992 Dependence on renal dialysis: Secondary | ICD-10-CM | POA: Diagnosis not present

## 2018-04-21 DIAGNOSIS — D631 Anemia in chronic kidney disease: Secondary | ICD-10-CM | POA: Diagnosis not present

## 2018-04-24 DIAGNOSIS — D631 Anemia in chronic kidney disease: Secondary | ICD-10-CM | POA: Diagnosis not present

## 2018-04-24 DIAGNOSIS — N186 End stage renal disease: Secondary | ICD-10-CM | POA: Diagnosis not present

## 2018-04-24 DIAGNOSIS — Z992 Dependence on renal dialysis: Secondary | ICD-10-CM | POA: Diagnosis not present

## 2018-04-24 DIAGNOSIS — N2581 Secondary hyperparathyroidism of renal origin: Secondary | ICD-10-CM | POA: Diagnosis not present

## 2018-04-24 DIAGNOSIS — D509 Iron deficiency anemia, unspecified: Secondary | ICD-10-CM | POA: Diagnosis not present

## 2018-04-26 DIAGNOSIS — Z992 Dependence on renal dialysis: Secondary | ICD-10-CM | POA: Diagnosis not present

## 2018-04-26 DIAGNOSIS — N2581 Secondary hyperparathyroidism of renal origin: Secondary | ICD-10-CM | POA: Diagnosis not present

## 2018-04-26 DIAGNOSIS — N186 End stage renal disease: Secondary | ICD-10-CM | POA: Diagnosis not present

## 2018-04-26 DIAGNOSIS — D509 Iron deficiency anemia, unspecified: Secondary | ICD-10-CM | POA: Diagnosis not present

## 2018-04-26 DIAGNOSIS — D631 Anemia in chronic kidney disease: Secondary | ICD-10-CM | POA: Diagnosis not present

## 2018-04-27 ENCOUNTER — Encounter: Payer: Self-pay | Admitting: Family Medicine

## 2018-04-27 ENCOUNTER — Other Ambulatory Visit: Payer: Self-pay

## 2018-04-27 ENCOUNTER — Ambulatory Visit (INDEPENDENT_AMBULATORY_CARE_PROVIDER_SITE_OTHER): Payer: Medicare Other | Admitting: Family Medicine

## 2018-04-27 VITALS — Wt 182.0 lb

## 2018-04-27 DIAGNOSIS — E119 Type 2 diabetes mellitus without complications: Secondary | ICD-10-CM

## 2018-04-27 DIAGNOSIS — E785 Hyperlipidemia, unspecified: Secondary | ICD-10-CM | POA: Diagnosis not present

## 2018-04-27 DIAGNOSIS — F321 Major depressive disorder, single episode, moderate: Secondary | ICD-10-CM | POA: Diagnosis not present

## 2018-04-27 DIAGNOSIS — I10 Essential (primary) hypertension: Secondary | ICD-10-CM

## 2018-04-27 DIAGNOSIS — Z794 Long term (current) use of insulin: Secondary | ICD-10-CM | POA: Diagnosis not present

## 2018-04-27 MED ORDER — INSULIN NPH (HUMAN) (ISOPHANE) 100 UNIT/ML ~~LOC~~ SUSP: SUBCUTANEOUS | 11 refills | Status: DC

## 2018-04-27 NOTE — Progress Notes (Signed)
   Subjective:    Patient ID: Paul Gonzalez, male    DOB: 07-04-1965, 53 y.o.   MRN: [id number] Audio only Hypertension  This is a chronic problem. There are no compliance problems.   Paul Gonzalez is HPA. Pt is also on dialysis. Pt is doing ok, no problems or concerns at this time.  Pt has lab values available also.   Patient claims compliance with diabetes medication. No obvious side effects. Reports no substantial low sugar spells. Most numbers are generally in good range when checked fasting. Generally does not miss a dose of medication. Watching diabetic diet closely   Virtual Visit via Telephone Note  I connected with Paul Gonzalez on 04/27/18 at 10:00 AM EDT by telephone and verified that I am speaking with the correct person using two identifiers.   I discussed the limitations, risks, security and privacy concerns of performing an evaluation and management service by telephone and the availability of in person appointments. I also discussed with the patient that there may be a patient responsible charge related to this service. The patient expressed understanding and agreed to proceed.  7.6 per cent A1c  bp overalll    No low History of Present Illness:    Observations/Objective:   Assessment and Plan:   Follow Up Instructions:    I discussed the assessment and treatment plan with the patient. The patient was provided an opportunity to ask questions and all were answered. The patient agreed with the plan and demonstrated an understanding of the instructions.   The patient was advised to call back or seek an in-person evaluation if the symptoms worsen or if the condition fails to improve as anticipated.  I provided 25 minutes of non-face-to-face time during this encounter.  Purchase nov n  Patient claims compliance with diabetes medication. No obvious side effects. Reports no substantial low sugar spells. Most numbers are generally in good range when checked fasting.  Generally does not miss a dose of medication. Watching diabetic diet closely  Patient notes ongoing compliance with antidepressant medication. No obvious side effects. Reports does not miss a dose. Overall continues to help depression substantially. No thoughts of homicide or suicide. Would like to maintain medication.  Blood pressure medicine and blood pressure levels reviewed today with patient.  No longer on blood pressure medications.  Blood pressure still running good. Blood pressure generally good when checked elsewhere. Watching salt intake.   Vicente Males, LPN  Review of Systems No headache, no major weight loss or weight gain, no chest pain no back pain abdominal pain no change in bowel habits complete ROS otherwise negative     Objective:   Physical Exam   Virtual visit     Assessment & Plan:  Impression type 2 diabetes.  Control apparently good.  Maintain insulin.  Diet discussed exercise discussed  2.  Depression clinically stable patient maintain same meds  3.  Chronic renal failure patient on dialysis  4.  Hypertension.  Currently on no medications  Follow-up in 6 months medications refilled diet exercise discussed

## 2018-04-28 DIAGNOSIS — D509 Iron deficiency anemia, unspecified: Secondary | ICD-10-CM | POA: Diagnosis not present

## 2018-04-28 DIAGNOSIS — D631 Anemia in chronic kidney disease: Secondary | ICD-10-CM | POA: Diagnosis not present

## 2018-04-28 DIAGNOSIS — Z992 Dependence on renal dialysis: Secondary | ICD-10-CM | POA: Diagnosis not present

## 2018-04-28 DIAGNOSIS — N186 End stage renal disease: Secondary | ICD-10-CM | POA: Diagnosis not present

## 2018-04-28 DIAGNOSIS — N2581 Secondary hyperparathyroidism of renal origin: Secondary | ICD-10-CM | POA: Diagnosis not present

## 2018-05-01 DIAGNOSIS — D631 Anemia in chronic kidney disease: Secondary | ICD-10-CM | POA: Diagnosis not present

## 2018-05-01 DIAGNOSIS — N2581 Secondary hyperparathyroidism of renal origin: Secondary | ICD-10-CM | POA: Diagnosis not present

## 2018-05-01 DIAGNOSIS — Z992 Dependence on renal dialysis: Secondary | ICD-10-CM | POA: Diagnosis not present

## 2018-05-01 DIAGNOSIS — D509 Iron deficiency anemia, unspecified: Secondary | ICD-10-CM | POA: Diagnosis not present

## 2018-05-01 DIAGNOSIS — N186 End stage renal disease: Secondary | ICD-10-CM | POA: Diagnosis not present

## 2018-05-03 DIAGNOSIS — D509 Iron deficiency anemia, unspecified: Secondary | ICD-10-CM | POA: Diagnosis not present

## 2018-05-03 DIAGNOSIS — Z992 Dependence on renal dialysis: Secondary | ICD-10-CM | POA: Diagnosis not present

## 2018-05-03 DIAGNOSIS — N186 End stage renal disease: Secondary | ICD-10-CM | POA: Diagnosis not present

## 2018-05-03 DIAGNOSIS — N2581 Secondary hyperparathyroidism of renal origin: Secondary | ICD-10-CM | POA: Diagnosis not present

## 2018-05-03 DIAGNOSIS — D631 Anemia in chronic kidney disease: Secondary | ICD-10-CM | POA: Diagnosis not present

## 2018-05-04 DIAGNOSIS — N186 End stage renal disease: Secondary | ICD-10-CM | POA: Diagnosis not present

## 2018-05-04 DIAGNOSIS — Z992 Dependence on renal dialysis: Secondary | ICD-10-CM | POA: Diagnosis not present

## 2018-05-05 DIAGNOSIS — D631 Anemia in chronic kidney disease: Secondary | ICD-10-CM | POA: Diagnosis not present

## 2018-05-05 DIAGNOSIS — D509 Iron deficiency anemia, unspecified: Secondary | ICD-10-CM | POA: Diagnosis not present

## 2018-05-05 DIAGNOSIS — N186 End stage renal disease: Secondary | ICD-10-CM | POA: Diagnosis not present

## 2018-05-05 DIAGNOSIS — N2581 Secondary hyperparathyroidism of renal origin: Secondary | ICD-10-CM | POA: Diagnosis not present

## 2018-05-05 DIAGNOSIS — Z992 Dependence on renal dialysis: Secondary | ICD-10-CM | POA: Diagnosis not present

## 2018-05-08 DIAGNOSIS — D631 Anemia in chronic kidney disease: Secondary | ICD-10-CM | POA: Diagnosis not present

## 2018-05-08 DIAGNOSIS — Z992 Dependence on renal dialysis: Secondary | ICD-10-CM | POA: Diagnosis not present

## 2018-05-08 DIAGNOSIS — N2581 Secondary hyperparathyroidism of renal origin: Secondary | ICD-10-CM | POA: Diagnosis not present

## 2018-05-08 DIAGNOSIS — N186 End stage renal disease: Secondary | ICD-10-CM | POA: Diagnosis not present

## 2018-05-08 DIAGNOSIS — D509 Iron deficiency anemia, unspecified: Secondary | ICD-10-CM | POA: Diagnosis not present

## 2018-05-10 DIAGNOSIS — Z992 Dependence on renal dialysis: Secondary | ICD-10-CM | POA: Diagnosis not present

## 2018-05-10 DIAGNOSIS — N186 End stage renal disease: Secondary | ICD-10-CM | POA: Diagnosis not present

## 2018-05-10 DIAGNOSIS — D631 Anemia in chronic kidney disease: Secondary | ICD-10-CM | POA: Diagnosis not present

## 2018-05-10 DIAGNOSIS — D509 Iron deficiency anemia, unspecified: Secondary | ICD-10-CM | POA: Diagnosis not present

## 2018-05-10 DIAGNOSIS — N2581 Secondary hyperparathyroidism of renal origin: Secondary | ICD-10-CM | POA: Diagnosis not present

## 2018-05-12 DIAGNOSIS — Z992 Dependence on renal dialysis: Secondary | ICD-10-CM | POA: Diagnosis not present

## 2018-05-12 DIAGNOSIS — N2581 Secondary hyperparathyroidism of renal origin: Secondary | ICD-10-CM | POA: Diagnosis not present

## 2018-05-12 DIAGNOSIS — D631 Anemia in chronic kidney disease: Secondary | ICD-10-CM | POA: Diagnosis not present

## 2018-05-12 DIAGNOSIS — D509 Iron deficiency anemia, unspecified: Secondary | ICD-10-CM | POA: Diagnosis not present

## 2018-05-12 DIAGNOSIS — N186 End stage renal disease: Secondary | ICD-10-CM | POA: Diagnosis not present

## 2018-05-15 DIAGNOSIS — D631 Anemia in chronic kidney disease: Secondary | ICD-10-CM | POA: Diagnosis not present

## 2018-05-15 DIAGNOSIS — Z992 Dependence on renal dialysis: Secondary | ICD-10-CM | POA: Diagnosis not present

## 2018-05-15 DIAGNOSIS — D509 Iron deficiency anemia, unspecified: Secondary | ICD-10-CM | POA: Diagnosis not present

## 2018-05-15 DIAGNOSIS — N2581 Secondary hyperparathyroidism of renal origin: Secondary | ICD-10-CM | POA: Diagnosis not present

## 2018-05-15 DIAGNOSIS — N186 End stage renal disease: Secondary | ICD-10-CM | POA: Diagnosis not present

## 2018-05-17 DIAGNOSIS — D631 Anemia in chronic kidney disease: Secondary | ICD-10-CM | POA: Diagnosis not present

## 2018-05-17 DIAGNOSIS — N186 End stage renal disease: Secondary | ICD-10-CM | POA: Diagnosis not present

## 2018-05-17 DIAGNOSIS — Z992 Dependence on renal dialysis: Secondary | ICD-10-CM | POA: Diagnosis not present

## 2018-05-17 DIAGNOSIS — D509 Iron deficiency anemia, unspecified: Secondary | ICD-10-CM | POA: Diagnosis not present

## 2018-05-17 DIAGNOSIS — N2581 Secondary hyperparathyroidism of renal origin: Secondary | ICD-10-CM | POA: Diagnosis not present

## 2018-05-19 DIAGNOSIS — D631 Anemia in chronic kidney disease: Secondary | ICD-10-CM | POA: Diagnosis not present

## 2018-05-19 DIAGNOSIS — N186 End stage renal disease: Secondary | ICD-10-CM | POA: Diagnosis not present

## 2018-05-19 DIAGNOSIS — Z992 Dependence on renal dialysis: Secondary | ICD-10-CM | POA: Diagnosis not present

## 2018-05-19 DIAGNOSIS — N2581 Secondary hyperparathyroidism of renal origin: Secondary | ICD-10-CM | POA: Diagnosis not present

## 2018-05-19 DIAGNOSIS — D509 Iron deficiency anemia, unspecified: Secondary | ICD-10-CM | POA: Diagnosis not present

## 2018-05-22 DIAGNOSIS — N2581 Secondary hyperparathyroidism of renal origin: Secondary | ICD-10-CM | POA: Diagnosis not present

## 2018-05-22 DIAGNOSIS — D509 Iron deficiency anemia, unspecified: Secondary | ICD-10-CM | POA: Diagnosis not present

## 2018-05-22 DIAGNOSIS — N186 End stage renal disease: Secondary | ICD-10-CM | POA: Diagnosis not present

## 2018-05-22 DIAGNOSIS — Z992 Dependence on renal dialysis: Secondary | ICD-10-CM | POA: Diagnosis not present

## 2018-05-22 DIAGNOSIS — D631 Anemia in chronic kidney disease: Secondary | ICD-10-CM | POA: Diagnosis not present

## 2018-05-24 ENCOUNTER — Other Ambulatory Visit: Payer: Self-pay | Admitting: Family Medicine

## 2018-05-24 DIAGNOSIS — N2581 Secondary hyperparathyroidism of renal origin: Secondary | ICD-10-CM | POA: Diagnosis not present

## 2018-05-24 DIAGNOSIS — D509 Iron deficiency anemia, unspecified: Secondary | ICD-10-CM | POA: Diagnosis not present

## 2018-05-24 DIAGNOSIS — D631 Anemia in chronic kidney disease: Secondary | ICD-10-CM | POA: Diagnosis not present

## 2018-05-24 DIAGNOSIS — N186 End stage renal disease: Secondary | ICD-10-CM | POA: Diagnosis not present

## 2018-05-24 DIAGNOSIS — Z992 Dependence on renal dialysis: Secondary | ICD-10-CM | POA: Diagnosis not present

## 2018-05-26 ENCOUNTER — Other Ambulatory Visit: Payer: Self-pay | Admitting: Family Medicine

## 2018-05-26 DIAGNOSIS — Z992 Dependence on renal dialysis: Secondary | ICD-10-CM | POA: Diagnosis not present

## 2018-05-26 DIAGNOSIS — N186 End stage renal disease: Secondary | ICD-10-CM | POA: Diagnosis not present

## 2018-05-26 DIAGNOSIS — D631 Anemia in chronic kidney disease: Secondary | ICD-10-CM | POA: Diagnosis not present

## 2018-05-26 DIAGNOSIS — D509 Iron deficiency anemia, unspecified: Secondary | ICD-10-CM | POA: Diagnosis not present

## 2018-05-26 DIAGNOSIS — N2581 Secondary hyperparathyroidism of renal origin: Secondary | ICD-10-CM | POA: Diagnosis not present

## 2018-05-31 DIAGNOSIS — N2581 Secondary hyperparathyroidism of renal origin: Secondary | ICD-10-CM | POA: Diagnosis not present

## 2018-05-31 DIAGNOSIS — N186 End stage renal disease: Secondary | ICD-10-CM | POA: Diagnosis not present

## 2018-05-31 DIAGNOSIS — Z992 Dependence on renal dialysis: Secondary | ICD-10-CM | POA: Diagnosis not present

## 2018-05-31 DIAGNOSIS — D509 Iron deficiency anemia, unspecified: Secondary | ICD-10-CM | POA: Diagnosis not present

## 2018-05-31 DIAGNOSIS — D631 Anemia in chronic kidney disease: Secondary | ICD-10-CM | POA: Diagnosis not present

## 2018-06-02 DIAGNOSIS — D631 Anemia in chronic kidney disease: Secondary | ICD-10-CM | POA: Diagnosis not present

## 2018-06-02 DIAGNOSIS — N2581 Secondary hyperparathyroidism of renal origin: Secondary | ICD-10-CM | POA: Diagnosis not present

## 2018-06-02 DIAGNOSIS — D509 Iron deficiency anemia, unspecified: Secondary | ICD-10-CM | POA: Diagnosis not present

## 2018-06-02 DIAGNOSIS — N186 End stage renal disease: Secondary | ICD-10-CM | POA: Diagnosis not present

## 2018-06-02 DIAGNOSIS — Z992 Dependence on renal dialysis: Secondary | ICD-10-CM | POA: Diagnosis not present

## 2018-06-04 DIAGNOSIS — N186 End stage renal disease: Secondary | ICD-10-CM | POA: Diagnosis not present

## 2018-06-04 DIAGNOSIS — Z992 Dependence on renal dialysis: Secondary | ICD-10-CM | POA: Diagnosis not present

## 2018-06-05 DIAGNOSIS — N2581 Secondary hyperparathyroidism of renal origin: Secondary | ICD-10-CM | POA: Diagnosis not present

## 2018-06-05 DIAGNOSIS — D509 Iron deficiency anemia, unspecified: Secondary | ICD-10-CM | POA: Diagnosis not present

## 2018-06-05 DIAGNOSIS — Z992 Dependence on renal dialysis: Secondary | ICD-10-CM | POA: Diagnosis not present

## 2018-06-05 DIAGNOSIS — D631 Anemia in chronic kidney disease: Secondary | ICD-10-CM | POA: Diagnosis not present

## 2018-06-05 DIAGNOSIS — N186 End stage renal disease: Secondary | ICD-10-CM | POA: Diagnosis not present

## 2018-06-07 DIAGNOSIS — N186 End stage renal disease: Secondary | ICD-10-CM | POA: Diagnosis not present

## 2018-06-07 DIAGNOSIS — N2581 Secondary hyperparathyroidism of renal origin: Secondary | ICD-10-CM | POA: Diagnosis not present

## 2018-06-07 DIAGNOSIS — Z992 Dependence on renal dialysis: Secondary | ICD-10-CM | POA: Diagnosis not present

## 2018-06-07 DIAGNOSIS — D631 Anemia in chronic kidney disease: Secondary | ICD-10-CM | POA: Diagnosis not present

## 2018-06-07 DIAGNOSIS — D509 Iron deficiency anemia, unspecified: Secondary | ICD-10-CM | POA: Diagnosis not present

## 2018-06-09 DIAGNOSIS — D631 Anemia in chronic kidney disease: Secondary | ICD-10-CM | POA: Diagnosis not present

## 2018-06-09 DIAGNOSIS — N2581 Secondary hyperparathyroidism of renal origin: Secondary | ICD-10-CM | POA: Diagnosis not present

## 2018-06-09 DIAGNOSIS — Z992 Dependence on renal dialysis: Secondary | ICD-10-CM | POA: Diagnosis not present

## 2018-06-09 DIAGNOSIS — N186 End stage renal disease: Secondary | ICD-10-CM | POA: Diagnosis not present

## 2018-06-09 DIAGNOSIS — D509 Iron deficiency anemia, unspecified: Secondary | ICD-10-CM | POA: Diagnosis not present

## 2018-06-12 DIAGNOSIS — Z992 Dependence on renal dialysis: Secondary | ICD-10-CM | POA: Diagnosis not present

## 2018-06-12 DIAGNOSIS — D631 Anemia in chronic kidney disease: Secondary | ICD-10-CM | POA: Diagnosis not present

## 2018-06-12 DIAGNOSIS — N2581 Secondary hyperparathyroidism of renal origin: Secondary | ICD-10-CM | POA: Diagnosis not present

## 2018-06-12 DIAGNOSIS — N186 End stage renal disease: Secondary | ICD-10-CM | POA: Diagnosis not present

## 2018-06-12 DIAGNOSIS — D509 Iron deficiency anemia, unspecified: Secondary | ICD-10-CM | POA: Diagnosis not present

## 2018-06-14 DIAGNOSIS — D509 Iron deficiency anemia, unspecified: Secondary | ICD-10-CM | POA: Diagnosis not present

## 2018-06-14 DIAGNOSIS — N2581 Secondary hyperparathyroidism of renal origin: Secondary | ICD-10-CM | POA: Diagnosis not present

## 2018-06-14 DIAGNOSIS — Z992 Dependence on renal dialysis: Secondary | ICD-10-CM | POA: Diagnosis not present

## 2018-06-14 DIAGNOSIS — D631 Anemia in chronic kidney disease: Secondary | ICD-10-CM | POA: Diagnosis not present

## 2018-06-14 DIAGNOSIS — N186 End stage renal disease: Secondary | ICD-10-CM | POA: Diagnosis not present

## 2018-06-16 DIAGNOSIS — N186 End stage renal disease: Secondary | ICD-10-CM | POA: Diagnosis not present

## 2018-06-16 DIAGNOSIS — Z992 Dependence on renal dialysis: Secondary | ICD-10-CM | POA: Diagnosis not present

## 2018-06-16 DIAGNOSIS — D509 Iron deficiency anemia, unspecified: Secondary | ICD-10-CM | POA: Diagnosis not present

## 2018-06-16 DIAGNOSIS — N2581 Secondary hyperparathyroidism of renal origin: Secondary | ICD-10-CM | POA: Diagnosis not present

## 2018-06-16 DIAGNOSIS — D631 Anemia in chronic kidney disease: Secondary | ICD-10-CM | POA: Diagnosis not present

## 2018-06-19 DIAGNOSIS — N2581 Secondary hyperparathyroidism of renal origin: Secondary | ICD-10-CM | POA: Diagnosis not present

## 2018-06-19 DIAGNOSIS — D631 Anemia in chronic kidney disease: Secondary | ICD-10-CM | POA: Diagnosis not present

## 2018-06-19 DIAGNOSIS — N186 End stage renal disease: Secondary | ICD-10-CM | POA: Diagnosis not present

## 2018-06-19 DIAGNOSIS — Z992 Dependence on renal dialysis: Secondary | ICD-10-CM | POA: Diagnosis not present

## 2018-06-19 DIAGNOSIS — D509 Iron deficiency anemia, unspecified: Secondary | ICD-10-CM | POA: Diagnosis not present

## 2018-06-21 DIAGNOSIS — D509 Iron deficiency anemia, unspecified: Secondary | ICD-10-CM | POA: Diagnosis not present

## 2018-06-21 DIAGNOSIS — Z992 Dependence on renal dialysis: Secondary | ICD-10-CM | POA: Diagnosis not present

## 2018-06-21 DIAGNOSIS — N186 End stage renal disease: Secondary | ICD-10-CM | POA: Diagnosis not present

## 2018-06-21 DIAGNOSIS — N2581 Secondary hyperparathyroidism of renal origin: Secondary | ICD-10-CM | POA: Diagnosis not present

## 2018-06-21 DIAGNOSIS — D631 Anemia in chronic kidney disease: Secondary | ICD-10-CM | POA: Diagnosis not present

## 2018-06-23 DIAGNOSIS — D631 Anemia in chronic kidney disease: Secondary | ICD-10-CM | POA: Diagnosis not present

## 2018-06-23 DIAGNOSIS — Z992 Dependence on renal dialysis: Secondary | ICD-10-CM | POA: Diagnosis not present

## 2018-06-23 DIAGNOSIS — D509 Iron deficiency anemia, unspecified: Secondary | ICD-10-CM | POA: Diagnosis not present

## 2018-06-23 DIAGNOSIS — N186 End stage renal disease: Secondary | ICD-10-CM | POA: Diagnosis not present

## 2018-06-23 DIAGNOSIS — N2581 Secondary hyperparathyroidism of renal origin: Secondary | ICD-10-CM | POA: Diagnosis not present

## 2018-06-26 DIAGNOSIS — N2581 Secondary hyperparathyroidism of renal origin: Secondary | ICD-10-CM | POA: Diagnosis not present

## 2018-06-26 DIAGNOSIS — Z992 Dependence on renal dialysis: Secondary | ICD-10-CM | POA: Diagnosis not present

## 2018-06-26 DIAGNOSIS — D631 Anemia in chronic kidney disease: Secondary | ICD-10-CM | POA: Diagnosis not present

## 2018-06-26 DIAGNOSIS — D509 Iron deficiency anemia, unspecified: Secondary | ICD-10-CM | POA: Diagnosis not present

## 2018-06-26 DIAGNOSIS — N186 End stage renal disease: Secondary | ICD-10-CM | POA: Diagnosis not present

## 2018-06-28 DIAGNOSIS — N186 End stage renal disease: Secondary | ICD-10-CM | POA: Diagnosis not present

## 2018-06-28 DIAGNOSIS — Z992 Dependence on renal dialysis: Secondary | ICD-10-CM | POA: Diagnosis not present

## 2018-06-28 DIAGNOSIS — N2581 Secondary hyperparathyroidism of renal origin: Secondary | ICD-10-CM | POA: Diagnosis not present

## 2018-06-28 DIAGNOSIS — D509 Iron deficiency anemia, unspecified: Secondary | ICD-10-CM | POA: Diagnosis not present

## 2018-06-28 DIAGNOSIS — D631 Anemia in chronic kidney disease: Secondary | ICD-10-CM | POA: Diagnosis not present

## 2018-07-03 DIAGNOSIS — N2581 Secondary hyperparathyroidism of renal origin: Secondary | ICD-10-CM | POA: Diagnosis not present

## 2018-07-03 DIAGNOSIS — D631 Anemia in chronic kidney disease: Secondary | ICD-10-CM | POA: Diagnosis not present

## 2018-07-03 DIAGNOSIS — Z992 Dependence on renal dialysis: Secondary | ICD-10-CM | POA: Diagnosis not present

## 2018-07-03 DIAGNOSIS — N186 End stage renal disease: Secondary | ICD-10-CM | POA: Diagnosis not present

## 2018-07-03 DIAGNOSIS — D509 Iron deficiency anemia, unspecified: Secondary | ICD-10-CM | POA: Diagnosis not present

## 2018-07-04 DIAGNOSIS — N186 End stage renal disease: Secondary | ICD-10-CM | POA: Diagnosis not present

## 2018-07-04 DIAGNOSIS — Z992 Dependence on renal dialysis: Secondary | ICD-10-CM | POA: Diagnosis not present

## 2018-07-05 DIAGNOSIS — D631 Anemia in chronic kidney disease: Secondary | ICD-10-CM | POA: Diagnosis not present

## 2018-07-05 DIAGNOSIS — Z992 Dependence on renal dialysis: Secondary | ICD-10-CM | POA: Diagnosis not present

## 2018-07-05 DIAGNOSIS — N186 End stage renal disease: Secondary | ICD-10-CM | POA: Diagnosis not present

## 2018-07-05 DIAGNOSIS — D509 Iron deficiency anemia, unspecified: Secondary | ICD-10-CM | POA: Diagnosis not present

## 2018-07-05 DIAGNOSIS — N2581 Secondary hyperparathyroidism of renal origin: Secondary | ICD-10-CM | POA: Diagnosis not present

## 2018-07-07 DIAGNOSIS — D631 Anemia in chronic kidney disease: Secondary | ICD-10-CM | POA: Diagnosis not present

## 2018-07-07 DIAGNOSIS — D509 Iron deficiency anemia, unspecified: Secondary | ICD-10-CM | POA: Diagnosis not present

## 2018-07-07 DIAGNOSIS — Z992 Dependence on renal dialysis: Secondary | ICD-10-CM | POA: Diagnosis not present

## 2018-07-07 DIAGNOSIS — N186 End stage renal disease: Secondary | ICD-10-CM | POA: Diagnosis not present

## 2018-07-07 DIAGNOSIS — N2581 Secondary hyperparathyroidism of renal origin: Secondary | ICD-10-CM | POA: Diagnosis not present

## 2018-07-10 DIAGNOSIS — N2581 Secondary hyperparathyroidism of renal origin: Secondary | ICD-10-CM | POA: Diagnosis not present

## 2018-07-10 DIAGNOSIS — D631 Anemia in chronic kidney disease: Secondary | ICD-10-CM | POA: Diagnosis not present

## 2018-07-10 DIAGNOSIS — Z992 Dependence on renal dialysis: Secondary | ICD-10-CM | POA: Diagnosis not present

## 2018-07-10 DIAGNOSIS — D509 Iron deficiency anemia, unspecified: Secondary | ICD-10-CM | POA: Diagnosis not present

## 2018-07-10 DIAGNOSIS — N186 End stage renal disease: Secondary | ICD-10-CM | POA: Diagnosis not present

## 2018-07-12 DIAGNOSIS — N2581 Secondary hyperparathyroidism of renal origin: Secondary | ICD-10-CM | POA: Diagnosis not present

## 2018-07-12 DIAGNOSIS — Z992 Dependence on renal dialysis: Secondary | ICD-10-CM | POA: Diagnosis not present

## 2018-07-12 DIAGNOSIS — D509 Iron deficiency anemia, unspecified: Secondary | ICD-10-CM | POA: Diagnosis not present

## 2018-07-12 DIAGNOSIS — D631 Anemia in chronic kidney disease: Secondary | ICD-10-CM | POA: Diagnosis not present

## 2018-07-12 DIAGNOSIS — N186 End stage renal disease: Secondary | ICD-10-CM | POA: Diagnosis not present

## 2018-07-14 DIAGNOSIS — D509 Iron deficiency anemia, unspecified: Secondary | ICD-10-CM | POA: Diagnosis not present

## 2018-07-14 DIAGNOSIS — D631 Anemia in chronic kidney disease: Secondary | ICD-10-CM | POA: Diagnosis not present

## 2018-07-14 DIAGNOSIS — N2581 Secondary hyperparathyroidism of renal origin: Secondary | ICD-10-CM | POA: Diagnosis not present

## 2018-07-14 DIAGNOSIS — N186 End stage renal disease: Secondary | ICD-10-CM | POA: Diagnosis not present

## 2018-07-14 DIAGNOSIS — Z992 Dependence on renal dialysis: Secondary | ICD-10-CM | POA: Diagnosis not present

## 2018-07-17 DIAGNOSIS — D509 Iron deficiency anemia, unspecified: Secondary | ICD-10-CM | POA: Diagnosis not present

## 2018-07-17 DIAGNOSIS — D631 Anemia in chronic kidney disease: Secondary | ICD-10-CM | POA: Diagnosis not present

## 2018-07-17 DIAGNOSIS — E119 Type 2 diabetes mellitus without complications: Secondary | ICD-10-CM | POA: Diagnosis not present

## 2018-07-17 DIAGNOSIS — N186 End stage renal disease: Secondary | ICD-10-CM | POA: Diagnosis not present

## 2018-07-17 DIAGNOSIS — N2581 Secondary hyperparathyroidism of renal origin: Secondary | ICD-10-CM | POA: Diagnosis not present

## 2018-07-17 DIAGNOSIS — Z992 Dependence on renal dialysis: Secondary | ICD-10-CM | POA: Diagnosis not present

## 2018-07-19 DIAGNOSIS — N186 End stage renal disease: Secondary | ICD-10-CM | POA: Diagnosis not present

## 2018-07-19 DIAGNOSIS — N2581 Secondary hyperparathyroidism of renal origin: Secondary | ICD-10-CM | POA: Diagnosis not present

## 2018-07-19 DIAGNOSIS — D509 Iron deficiency anemia, unspecified: Secondary | ICD-10-CM | POA: Diagnosis not present

## 2018-07-19 DIAGNOSIS — Z992 Dependence on renal dialysis: Secondary | ICD-10-CM | POA: Diagnosis not present

## 2018-07-19 DIAGNOSIS — D631 Anemia in chronic kidney disease: Secondary | ICD-10-CM | POA: Diagnosis not present

## 2018-07-21 DIAGNOSIS — Z992 Dependence on renal dialysis: Secondary | ICD-10-CM | POA: Diagnosis not present

## 2018-07-21 DIAGNOSIS — N186 End stage renal disease: Secondary | ICD-10-CM | POA: Diagnosis not present

## 2018-07-21 DIAGNOSIS — D509 Iron deficiency anemia, unspecified: Secondary | ICD-10-CM | POA: Diagnosis not present

## 2018-07-21 DIAGNOSIS — D631 Anemia in chronic kidney disease: Secondary | ICD-10-CM | POA: Diagnosis not present

## 2018-07-21 DIAGNOSIS — N2581 Secondary hyperparathyroidism of renal origin: Secondary | ICD-10-CM | POA: Diagnosis not present

## 2018-07-24 DIAGNOSIS — Z992 Dependence on renal dialysis: Secondary | ICD-10-CM | POA: Diagnosis not present

## 2018-07-24 DIAGNOSIS — N2581 Secondary hyperparathyroidism of renal origin: Secondary | ICD-10-CM | POA: Diagnosis not present

## 2018-07-24 DIAGNOSIS — D631 Anemia in chronic kidney disease: Secondary | ICD-10-CM | POA: Diagnosis not present

## 2018-07-24 DIAGNOSIS — D509 Iron deficiency anemia, unspecified: Secondary | ICD-10-CM | POA: Diagnosis not present

## 2018-07-24 DIAGNOSIS — N186 End stage renal disease: Secondary | ICD-10-CM | POA: Diagnosis not present

## 2018-07-26 DIAGNOSIS — D631 Anemia in chronic kidney disease: Secondary | ICD-10-CM | POA: Diagnosis not present

## 2018-07-26 DIAGNOSIS — N2581 Secondary hyperparathyroidism of renal origin: Secondary | ICD-10-CM | POA: Diagnosis not present

## 2018-07-26 DIAGNOSIS — D509 Iron deficiency anemia, unspecified: Secondary | ICD-10-CM | POA: Diagnosis not present

## 2018-07-26 DIAGNOSIS — N186 End stage renal disease: Secondary | ICD-10-CM | POA: Diagnosis not present

## 2018-07-26 DIAGNOSIS — Z992 Dependence on renal dialysis: Secondary | ICD-10-CM | POA: Diagnosis not present

## 2018-07-28 DIAGNOSIS — Z992 Dependence on renal dialysis: Secondary | ICD-10-CM | POA: Diagnosis not present

## 2018-07-28 DIAGNOSIS — D509 Iron deficiency anemia, unspecified: Secondary | ICD-10-CM | POA: Diagnosis not present

## 2018-07-28 DIAGNOSIS — D631 Anemia in chronic kidney disease: Secondary | ICD-10-CM | POA: Diagnosis not present

## 2018-07-28 DIAGNOSIS — N186 End stage renal disease: Secondary | ICD-10-CM | POA: Diagnosis not present

## 2018-07-28 DIAGNOSIS — N2581 Secondary hyperparathyroidism of renal origin: Secondary | ICD-10-CM | POA: Diagnosis not present

## 2018-07-31 DIAGNOSIS — N186 End stage renal disease: Secondary | ICD-10-CM | POA: Diagnosis not present

## 2018-07-31 DIAGNOSIS — D631 Anemia in chronic kidney disease: Secondary | ICD-10-CM | POA: Diagnosis not present

## 2018-07-31 DIAGNOSIS — Z992 Dependence on renal dialysis: Secondary | ICD-10-CM | POA: Diagnosis not present

## 2018-07-31 DIAGNOSIS — D509 Iron deficiency anemia, unspecified: Secondary | ICD-10-CM | POA: Diagnosis not present

## 2018-07-31 DIAGNOSIS — N2581 Secondary hyperparathyroidism of renal origin: Secondary | ICD-10-CM | POA: Diagnosis not present

## 2018-08-02 DIAGNOSIS — D631 Anemia in chronic kidney disease: Secondary | ICD-10-CM | POA: Diagnosis not present

## 2018-08-02 DIAGNOSIS — N186 End stage renal disease: Secondary | ICD-10-CM | POA: Diagnosis not present

## 2018-08-02 DIAGNOSIS — D509 Iron deficiency anemia, unspecified: Secondary | ICD-10-CM | POA: Diagnosis not present

## 2018-08-02 DIAGNOSIS — N2581 Secondary hyperparathyroidism of renal origin: Secondary | ICD-10-CM | POA: Diagnosis not present

## 2018-08-02 DIAGNOSIS — Z992 Dependence on renal dialysis: Secondary | ICD-10-CM | POA: Diagnosis not present

## 2018-08-04 DIAGNOSIS — D509 Iron deficiency anemia, unspecified: Secondary | ICD-10-CM | POA: Diagnosis not present

## 2018-08-04 DIAGNOSIS — N186 End stage renal disease: Secondary | ICD-10-CM | POA: Diagnosis not present

## 2018-08-04 DIAGNOSIS — D631 Anemia in chronic kidney disease: Secondary | ICD-10-CM | POA: Diagnosis not present

## 2018-08-04 DIAGNOSIS — Z992 Dependence on renal dialysis: Secondary | ICD-10-CM | POA: Diagnosis not present

## 2018-08-04 DIAGNOSIS — N2581 Secondary hyperparathyroidism of renal origin: Secondary | ICD-10-CM | POA: Diagnosis not present

## 2018-08-07 DIAGNOSIS — Z992 Dependence on renal dialysis: Secondary | ICD-10-CM | POA: Diagnosis not present

## 2018-08-07 DIAGNOSIS — D509 Iron deficiency anemia, unspecified: Secondary | ICD-10-CM | POA: Diagnosis not present

## 2018-08-07 DIAGNOSIS — N186 End stage renal disease: Secondary | ICD-10-CM | POA: Diagnosis not present

## 2018-08-07 DIAGNOSIS — D631 Anemia in chronic kidney disease: Secondary | ICD-10-CM | POA: Diagnosis not present

## 2018-08-07 DIAGNOSIS — N2581 Secondary hyperparathyroidism of renal origin: Secondary | ICD-10-CM | POA: Diagnosis not present

## 2018-08-09 DIAGNOSIS — N2581 Secondary hyperparathyroidism of renal origin: Secondary | ICD-10-CM | POA: Diagnosis not present

## 2018-08-09 DIAGNOSIS — Z992 Dependence on renal dialysis: Secondary | ICD-10-CM | POA: Diagnosis not present

## 2018-08-09 DIAGNOSIS — N186 End stage renal disease: Secondary | ICD-10-CM | POA: Diagnosis not present

## 2018-08-09 DIAGNOSIS — D509 Iron deficiency anemia, unspecified: Secondary | ICD-10-CM | POA: Diagnosis not present

## 2018-08-09 DIAGNOSIS — D631 Anemia in chronic kidney disease: Secondary | ICD-10-CM | POA: Diagnosis not present

## 2018-08-11 DIAGNOSIS — D631 Anemia in chronic kidney disease: Secondary | ICD-10-CM | POA: Diagnosis not present

## 2018-08-11 DIAGNOSIS — N186 End stage renal disease: Secondary | ICD-10-CM | POA: Diagnosis not present

## 2018-08-11 DIAGNOSIS — D509 Iron deficiency anemia, unspecified: Secondary | ICD-10-CM | POA: Diagnosis not present

## 2018-08-11 DIAGNOSIS — Z992 Dependence on renal dialysis: Secondary | ICD-10-CM | POA: Diagnosis not present

## 2018-08-11 DIAGNOSIS — N2581 Secondary hyperparathyroidism of renal origin: Secondary | ICD-10-CM | POA: Diagnosis not present

## 2018-08-14 DIAGNOSIS — D509 Iron deficiency anemia, unspecified: Secondary | ICD-10-CM | POA: Diagnosis not present

## 2018-08-14 DIAGNOSIS — D631 Anemia in chronic kidney disease: Secondary | ICD-10-CM | POA: Diagnosis not present

## 2018-08-14 DIAGNOSIS — Z992 Dependence on renal dialysis: Secondary | ICD-10-CM | POA: Diagnosis not present

## 2018-08-14 DIAGNOSIS — N2581 Secondary hyperparathyroidism of renal origin: Secondary | ICD-10-CM | POA: Diagnosis not present

## 2018-08-14 DIAGNOSIS — N186 End stage renal disease: Secondary | ICD-10-CM | POA: Diagnosis not present

## 2018-08-16 DIAGNOSIS — N186 End stage renal disease: Secondary | ICD-10-CM | POA: Diagnosis not present

## 2018-08-16 DIAGNOSIS — N2581 Secondary hyperparathyroidism of renal origin: Secondary | ICD-10-CM | POA: Diagnosis not present

## 2018-08-16 DIAGNOSIS — D509 Iron deficiency anemia, unspecified: Secondary | ICD-10-CM | POA: Diagnosis not present

## 2018-08-16 DIAGNOSIS — Z992 Dependence on renal dialysis: Secondary | ICD-10-CM | POA: Diagnosis not present

## 2018-08-16 DIAGNOSIS — D631 Anemia in chronic kidney disease: Secondary | ICD-10-CM | POA: Diagnosis not present

## 2018-08-18 DIAGNOSIS — N186 End stage renal disease: Secondary | ICD-10-CM | POA: Diagnosis not present

## 2018-08-18 DIAGNOSIS — D509 Iron deficiency anemia, unspecified: Secondary | ICD-10-CM | POA: Diagnosis not present

## 2018-08-18 DIAGNOSIS — Z992 Dependence on renal dialysis: Secondary | ICD-10-CM | POA: Diagnosis not present

## 2018-08-18 DIAGNOSIS — D631 Anemia in chronic kidney disease: Secondary | ICD-10-CM | POA: Diagnosis not present

## 2018-08-18 DIAGNOSIS — N2581 Secondary hyperparathyroidism of renal origin: Secondary | ICD-10-CM | POA: Diagnosis not present

## 2018-08-21 ENCOUNTER — Other Ambulatory Visit: Payer: Self-pay | Admitting: Family Medicine

## 2018-08-21 DIAGNOSIS — D509 Iron deficiency anemia, unspecified: Secondary | ICD-10-CM | POA: Diagnosis not present

## 2018-08-21 DIAGNOSIS — Z992 Dependence on renal dialysis: Secondary | ICD-10-CM | POA: Diagnosis not present

## 2018-08-21 DIAGNOSIS — N186 End stage renal disease: Secondary | ICD-10-CM | POA: Diagnosis not present

## 2018-08-21 DIAGNOSIS — N2581 Secondary hyperparathyroidism of renal origin: Secondary | ICD-10-CM | POA: Diagnosis not present

## 2018-08-21 DIAGNOSIS — D631 Anemia in chronic kidney disease: Secondary | ICD-10-CM | POA: Diagnosis not present

## 2018-08-23 DIAGNOSIS — D631 Anemia in chronic kidney disease: Secondary | ICD-10-CM | POA: Diagnosis not present

## 2018-08-23 DIAGNOSIS — Z992 Dependence on renal dialysis: Secondary | ICD-10-CM | POA: Diagnosis not present

## 2018-08-23 DIAGNOSIS — N2581 Secondary hyperparathyroidism of renal origin: Secondary | ICD-10-CM | POA: Diagnosis not present

## 2018-08-23 DIAGNOSIS — N186 End stage renal disease: Secondary | ICD-10-CM | POA: Diagnosis not present

## 2018-08-23 DIAGNOSIS — D509 Iron deficiency anemia, unspecified: Secondary | ICD-10-CM | POA: Diagnosis not present

## 2018-08-24 ENCOUNTER — Telehealth: Payer: Self-pay | Admitting: Family Medicine

## 2018-08-24 ENCOUNTER — Other Ambulatory Visit: Payer: Self-pay | Admitting: Family Medicine

## 2018-08-24 MED ORDER — CITALOPRAM HYDROBROMIDE 20 MG PO TABS: 20.0000 mg | ORAL_TABLET | Freq: Every day | ORAL | 0 refills | Status: DC

## 2018-08-24 NOTE — Addendum Note (Signed)
Addended by: Dairl Ponder on: 08/24/2018 02:28 PM   Modules accepted: Orders

## 2018-08-24 NOTE — Telephone Encounter (Signed)
Patient was checking on refill citalopram 20 mg ,he states pharmacy didn't get request can you resend to [facility] [employer] [facility] completely out

## 2018-08-24 NOTE — Telephone Encounter (Signed)
Prescription sent electronically to pharmacy. Patient notified. 

## 2018-08-25 DIAGNOSIS — D631 Anemia in chronic kidney disease: Secondary | ICD-10-CM | POA: Diagnosis not present

## 2018-08-25 DIAGNOSIS — N186 End stage renal disease: Secondary | ICD-10-CM | POA: Diagnosis not present

## 2018-08-25 DIAGNOSIS — Z992 Dependence on renal dialysis: Secondary | ICD-10-CM | POA: Diagnosis not present

## 2018-08-25 DIAGNOSIS — D509 Iron deficiency anemia, unspecified: Secondary | ICD-10-CM | POA: Diagnosis not present

## 2018-08-25 DIAGNOSIS — N2581 Secondary hyperparathyroidism of renal origin: Secondary | ICD-10-CM | POA: Diagnosis not present

## 2018-08-28 DIAGNOSIS — N186 End stage renal disease: Secondary | ICD-10-CM | POA: Diagnosis not present

## 2018-08-28 DIAGNOSIS — Z992 Dependence on renal dialysis: Secondary | ICD-10-CM | POA: Diagnosis not present

## 2018-08-28 DIAGNOSIS — D509 Iron deficiency anemia, unspecified: Secondary | ICD-10-CM | POA: Diagnosis not present

## 2018-08-28 DIAGNOSIS — N2581 Secondary hyperparathyroidism of renal origin: Secondary | ICD-10-CM | POA: Diagnosis not present

## 2018-08-28 DIAGNOSIS — D631 Anemia in chronic kidney disease: Secondary | ICD-10-CM | POA: Diagnosis not present

## 2018-08-30 DIAGNOSIS — Z992 Dependence on renal dialysis: Secondary | ICD-10-CM | POA: Diagnosis not present

## 2018-08-30 DIAGNOSIS — D509 Iron deficiency anemia, unspecified: Secondary | ICD-10-CM | POA: Diagnosis not present

## 2018-08-30 DIAGNOSIS — N2581 Secondary hyperparathyroidism of renal origin: Secondary | ICD-10-CM | POA: Diagnosis not present

## 2018-08-30 DIAGNOSIS — N186 End stage renal disease: Secondary | ICD-10-CM | POA: Diagnosis not present

## 2018-08-30 DIAGNOSIS — D631 Anemia in chronic kidney disease: Secondary | ICD-10-CM | POA: Diagnosis not present

## 2018-09-01 DIAGNOSIS — D509 Iron deficiency anemia, unspecified: Secondary | ICD-10-CM | POA: Diagnosis not present

## 2018-09-01 DIAGNOSIS — Z992 Dependence on renal dialysis: Secondary | ICD-10-CM | POA: Diagnosis not present

## 2018-09-01 DIAGNOSIS — D631 Anemia in chronic kidney disease: Secondary | ICD-10-CM | POA: Diagnosis not present

## 2018-09-01 DIAGNOSIS — N2581 Secondary hyperparathyroidism of renal origin: Secondary | ICD-10-CM | POA: Diagnosis not present

## 2018-09-01 DIAGNOSIS — N186 End stage renal disease: Secondary | ICD-10-CM | POA: Diagnosis not present

## 2018-09-04 DIAGNOSIS — D631 Anemia in chronic kidney disease: Secondary | ICD-10-CM | POA: Diagnosis not present

## 2018-09-04 DIAGNOSIS — Z992 Dependence on renal dialysis: Secondary | ICD-10-CM | POA: Diagnosis not present

## 2018-09-04 DIAGNOSIS — D509 Iron deficiency anemia, unspecified: Secondary | ICD-10-CM | POA: Diagnosis not present

## 2018-09-04 DIAGNOSIS — N2581 Secondary hyperparathyroidism of renal origin: Secondary | ICD-10-CM | POA: Diagnosis not present

## 2018-09-04 DIAGNOSIS — N186 End stage renal disease: Secondary | ICD-10-CM | POA: Diagnosis not present

## 2018-09-06 DIAGNOSIS — D631 Anemia in chronic kidney disease: Secondary | ICD-10-CM | POA: Diagnosis not present

## 2018-09-06 DIAGNOSIS — Z992 Dependence on renal dialysis: Secondary | ICD-10-CM | POA: Diagnosis not present

## 2018-09-06 DIAGNOSIS — Z23 Encounter for immunization: Secondary | ICD-10-CM | POA: Diagnosis not present

## 2018-09-06 DIAGNOSIS — N2581 Secondary hyperparathyroidism of renal origin: Secondary | ICD-10-CM | POA: Diagnosis not present

## 2018-09-06 DIAGNOSIS — N186 End stage renal disease: Secondary | ICD-10-CM | POA: Diagnosis not present

## 2018-09-06 DIAGNOSIS — D509 Iron deficiency anemia, unspecified: Secondary | ICD-10-CM | POA: Diagnosis not present

## 2018-09-08 DIAGNOSIS — Z23 Encounter for immunization: Secondary | ICD-10-CM | POA: Diagnosis not present

## 2018-09-08 DIAGNOSIS — N186 End stage renal disease: Secondary | ICD-10-CM | POA: Diagnosis not present

## 2018-09-08 DIAGNOSIS — Z992 Dependence on renal dialysis: Secondary | ICD-10-CM | POA: Diagnosis not present

## 2018-09-08 DIAGNOSIS — N2581 Secondary hyperparathyroidism of renal origin: Secondary | ICD-10-CM | POA: Diagnosis not present

## 2018-09-08 DIAGNOSIS — D631 Anemia in chronic kidney disease: Secondary | ICD-10-CM | POA: Diagnosis not present

## 2018-09-08 DIAGNOSIS — D509 Iron deficiency anemia, unspecified: Secondary | ICD-10-CM | POA: Diagnosis not present

## 2018-09-11 DIAGNOSIS — N2581 Secondary hyperparathyroidism of renal origin: Secondary | ICD-10-CM | POA: Diagnosis not present

## 2018-09-11 DIAGNOSIS — D509 Iron deficiency anemia, unspecified: Secondary | ICD-10-CM | POA: Diagnosis not present

## 2018-09-11 DIAGNOSIS — N186 End stage renal disease: Secondary | ICD-10-CM | POA: Diagnosis not present

## 2018-09-11 DIAGNOSIS — D631 Anemia in chronic kidney disease: Secondary | ICD-10-CM | POA: Diagnosis not present

## 2018-09-11 DIAGNOSIS — Z992 Dependence on renal dialysis: Secondary | ICD-10-CM | POA: Diagnosis not present

## 2018-09-11 DIAGNOSIS — Z23 Encounter for immunization: Secondary | ICD-10-CM | POA: Diagnosis not present

## 2018-09-13 DIAGNOSIS — D509 Iron deficiency anemia, unspecified: Secondary | ICD-10-CM | POA: Diagnosis not present

## 2018-09-13 DIAGNOSIS — N186 End stage renal disease: Secondary | ICD-10-CM | POA: Diagnosis not present

## 2018-09-13 DIAGNOSIS — N2581 Secondary hyperparathyroidism of renal origin: Secondary | ICD-10-CM | POA: Diagnosis not present

## 2018-09-13 DIAGNOSIS — Z23 Encounter for immunization: Secondary | ICD-10-CM | POA: Diagnosis not present

## 2018-09-13 DIAGNOSIS — D631 Anemia in chronic kidney disease: Secondary | ICD-10-CM | POA: Diagnosis not present

## 2018-09-13 DIAGNOSIS — Z992 Dependence on renal dialysis: Secondary | ICD-10-CM | POA: Diagnosis not present

## 2018-09-15 DIAGNOSIS — Z23 Encounter for immunization: Secondary | ICD-10-CM | POA: Diagnosis not present

## 2018-09-15 DIAGNOSIS — D509 Iron deficiency anemia, unspecified: Secondary | ICD-10-CM | POA: Diagnosis not present

## 2018-09-15 DIAGNOSIS — N2581 Secondary hyperparathyroidism of renal origin: Secondary | ICD-10-CM | POA: Diagnosis not present

## 2018-09-15 DIAGNOSIS — Z992 Dependence on renal dialysis: Secondary | ICD-10-CM | POA: Diagnosis not present

## 2018-09-15 DIAGNOSIS — D631 Anemia in chronic kidney disease: Secondary | ICD-10-CM | POA: Diagnosis not present

## 2018-09-15 DIAGNOSIS — N186 End stage renal disease: Secondary | ICD-10-CM | POA: Diagnosis not present

## 2018-09-18 DIAGNOSIS — Z23 Encounter for immunization: Secondary | ICD-10-CM | POA: Diagnosis not present

## 2018-09-18 DIAGNOSIS — Z992 Dependence on renal dialysis: Secondary | ICD-10-CM | POA: Diagnosis not present

## 2018-09-18 DIAGNOSIS — N186 End stage renal disease: Secondary | ICD-10-CM | POA: Diagnosis not present

## 2018-09-18 DIAGNOSIS — D631 Anemia in chronic kidney disease: Secondary | ICD-10-CM | POA: Diagnosis not present

## 2018-09-18 DIAGNOSIS — N2581 Secondary hyperparathyroidism of renal origin: Secondary | ICD-10-CM | POA: Diagnosis not present

## 2018-09-18 DIAGNOSIS — D509 Iron deficiency anemia, unspecified: Secondary | ICD-10-CM | POA: Diagnosis not present

## 2018-09-20 DIAGNOSIS — N186 End stage renal disease: Secondary | ICD-10-CM | POA: Diagnosis not present

## 2018-09-20 DIAGNOSIS — N2581 Secondary hyperparathyroidism of renal origin: Secondary | ICD-10-CM | POA: Diagnosis not present

## 2018-09-20 DIAGNOSIS — Z23 Encounter for immunization: Secondary | ICD-10-CM | POA: Diagnosis not present

## 2018-09-20 DIAGNOSIS — Z992 Dependence on renal dialysis: Secondary | ICD-10-CM | POA: Diagnosis not present

## 2018-09-20 DIAGNOSIS — D631 Anemia in chronic kidney disease: Secondary | ICD-10-CM | POA: Diagnosis not present

## 2018-09-20 DIAGNOSIS — D509 Iron deficiency anemia, unspecified: Secondary | ICD-10-CM | POA: Diagnosis not present

## 2018-09-22 DIAGNOSIS — Z992 Dependence on renal dialysis: Secondary | ICD-10-CM | POA: Diagnosis not present

## 2018-09-22 DIAGNOSIS — D631 Anemia in chronic kidney disease: Secondary | ICD-10-CM | POA: Diagnosis not present

## 2018-09-22 DIAGNOSIS — N2581 Secondary hyperparathyroidism of renal origin: Secondary | ICD-10-CM | POA: Diagnosis not present

## 2018-09-22 DIAGNOSIS — D509 Iron deficiency anemia, unspecified: Secondary | ICD-10-CM | POA: Diagnosis not present

## 2018-09-22 DIAGNOSIS — N186 End stage renal disease: Secondary | ICD-10-CM | POA: Diagnosis not present

## 2018-09-22 DIAGNOSIS — Z23 Encounter for immunization: Secondary | ICD-10-CM | POA: Diagnosis not present

## 2018-09-25 DIAGNOSIS — N2581 Secondary hyperparathyroidism of renal origin: Secondary | ICD-10-CM | POA: Diagnosis not present

## 2018-09-25 DIAGNOSIS — Z23 Encounter for immunization: Secondary | ICD-10-CM | POA: Diagnosis not present

## 2018-09-25 DIAGNOSIS — N186 End stage renal disease: Secondary | ICD-10-CM | POA: Diagnosis not present

## 2018-09-25 DIAGNOSIS — D509 Iron deficiency anemia, unspecified: Secondary | ICD-10-CM | POA: Diagnosis not present

## 2018-09-25 DIAGNOSIS — D631 Anemia in chronic kidney disease: Secondary | ICD-10-CM | POA: Diagnosis not present

## 2018-09-25 DIAGNOSIS — Z992 Dependence on renal dialysis: Secondary | ICD-10-CM | POA: Diagnosis not present

## 2018-09-27 DIAGNOSIS — Z23 Encounter for immunization: Secondary | ICD-10-CM | POA: Diagnosis not present

## 2018-09-27 DIAGNOSIS — D631 Anemia in chronic kidney disease: Secondary | ICD-10-CM | POA: Diagnosis not present

## 2018-09-27 DIAGNOSIS — Z992 Dependence on renal dialysis: Secondary | ICD-10-CM | POA: Diagnosis not present

## 2018-09-27 DIAGNOSIS — N186 End stage renal disease: Secondary | ICD-10-CM | POA: Diagnosis not present

## 2018-09-27 DIAGNOSIS — N2581 Secondary hyperparathyroidism of renal origin: Secondary | ICD-10-CM | POA: Diagnosis not present

## 2018-09-27 DIAGNOSIS — D509 Iron deficiency anemia, unspecified: Secondary | ICD-10-CM | POA: Diagnosis not present

## 2018-09-29 DIAGNOSIS — D509 Iron deficiency anemia, unspecified: Secondary | ICD-10-CM | POA: Diagnosis not present

## 2018-09-29 DIAGNOSIS — Z992 Dependence on renal dialysis: Secondary | ICD-10-CM | POA: Diagnosis not present

## 2018-09-29 DIAGNOSIS — N186 End stage renal disease: Secondary | ICD-10-CM | POA: Diagnosis not present

## 2018-09-29 DIAGNOSIS — D631 Anemia in chronic kidney disease: Secondary | ICD-10-CM | POA: Diagnosis not present

## 2018-09-29 DIAGNOSIS — Z23 Encounter for immunization: Secondary | ICD-10-CM | POA: Diagnosis not present

## 2018-09-29 DIAGNOSIS — N2581 Secondary hyperparathyroidism of renal origin: Secondary | ICD-10-CM | POA: Diagnosis not present

## 2018-10-02 DIAGNOSIS — N186 End stage renal disease: Secondary | ICD-10-CM | POA: Diagnosis not present

## 2018-10-02 DIAGNOSIS — D631 Anemia in chronic kidney disease: Secondary | ICD-10-CM | POA: Diagnosis not present

## 2018-10-02 DIAGNOSIS — N2581 Secondary hyperparathyroidism of renal origin: Secondary | ICD-10-CM | POA: Diagnosis not present

## 2018-10-02 DIAGNOSIS — Z992 Dependence on renal dialysis: Secondary | ICD-10-CM | POA: Diagnosis not present

## 2018-10-02 DIAGNOSIS — D509 Iron deficiency anemia, unspecified: Secondary | ICD-10-CM | POA: Diagnosis not present

## 2018-10-02 DIAGNOSIS — Z23 Encounter for immunization: Secondary | ICD-10-CM | POA: Diagnosis not present

## 2018-10-04 DIAGNOSIS — D509 Iron deficiency anemia, unspecified: Secondary | ICD-10-CM | POA: Diagnosis not present

## 2018-10-04 DIAGNOSIS — Z992 Dependence on renal dialysis: Secondary | ICD-10-CM | POA: Diagnosis not present

## 2018-10-04 DIAGNOSIS — Z23 Encounter for immunization: Secondary | ICD-10-CM | POA: Diagnosis not present

## 2018-10-04 DIAGNOSIS — N2581 Secondary hyperparathyroidism of renal origin: Secondary | ICD-10-CM | POA: Diagnosis not present

## 2018-10-04 DIAGNOSIS — D631 Anemia in chronic kidney disease: Secondary | ICD-10-CM | POA: Diagnosis not present

## 2018-10-04 DIAGNOSIS — N186 End stage renal disease: Secondary | ICD-10-CM | POA: Diagnosis not present

## 2018-10-06 DIAGNOSIS — N2581 Secondary hyperparathyroidism of renal origin: Secondary | ICD-10-CM | POA: Diagnosis not present

## 2018-10-06 DIAGNOSIS — D509 Iron deficiency anemia, unspecified: Secondary | ICD-10-CM | POA: Diagnosis not present

## 2018-10-06 DIAGNOSIS — D631 Anemia in chronic kidney disease: Secondary | ICD-10-CM | POA: Diagnosis not present

## 2018-10-06 DIAGNOSIS — N186 End stage renal disease: Secondary | ICD-10-CM | POA: Diagnosis not present

## 2018-10-06 DIAGNOSIS — Z23 Encounter for immunization: Secondary | ICD-10-CM | POA: Diagnosis not present

## 2018-10-06 DIAGNOSIS — Z992 Dependence on renal dialysis: Secondary | ICD-10-CM | POA: Diagnosis not present

## 2018-10-09 DIAGNOSIS — D509 Iron deficiency anemia, unspecified: Secondary | ICD-10-CM | POA: Diagnosis not present

## 2018-10-09 DIAGNOSIS — N2581 Secondary hyperparathyroidism of renal origin: Secondary | ICD-10-CM | POA: Diagnosis not present

## 2018-10-09 DIAGNOSIS — Z23 Encounter for immunization: Secondary | ICD-10-CM | POA: Diagnosis not present

## 2018-10-09 DIAGNOSIS — Z992 Dependence on renal dialysis: Secondary | ICD-10-CM | POA: Diagnosis not present

## 2018-10-09 DIAGNOSIS — N186 End stage renal disease: Secondary | ICD-10-CM | POA: Diagnosis not present

## 2018-10-09 DIAGNOSIS — D631 Anemia in chronic kidney disease: Secondary | ICD-10-CM | POA: Diagnosis not present

## 2018-10-11 DIAGNOSIS — Z23 Encounter for immunization: Secondary | ICD-10-CM | POA: Diagnosis not present

## 2018-10-11 DIAGNOSIS — N2581 Secondary hyperparathyroidism of renal origin: Secondary | ICD-10-CM | POA: Diagnosis not present

## 2018-10-11 DIAGNOSIS — N186 End stage renal disease: Secondary | ICD-10-CM | POA: Diagnosis not present

## 2018-10-11 DIAGNOSIS — D509 Iron deficiency anemia, unspecified: Secondary | ICD-10-CM | POA: Diagnosis not present

## 2018-10-11 DIAGNOSIS — Z992 Dependence on renal dialysis: Secondary | ICD-10-CM | POA: Diagnosis not present

## 2018-10-11 DIAGNOSIS — D631 Anemia in chronic kidney disease: Secondary | ICD-10-CM | POA: Diagnosis not present

## 2018-10-13 DIAGNOSIS — D631 Anemia in chronic kidney disease: Secondary | ICD-10-CM | POA: Diagnosis not present

## 2018-10-13 DIAGNOSIS — N186 End stage renal disease: Secondary | ICD-10-CM | POA: Diagnosis not present

## 2018-10-13 DIAGNOSIS — Z23 Encounter for immunization: Secondary | ICD-10-CM | POA: Diagnosis not present

## 2018-10-13 DIAGNOSIS — N2581 Secondary hyperparathyroidism of renal origin: Secondary | ICD-10-CM | POA: Diagnosis not present

## 2018-10-13 DIAGNOSIS — D509 Iron deficiency anemia, unspecified: Secondary | ICD-10-CM | POA: Diagnosis not present

## 2018-10-13 DIAGNOSIS — Z992 Dependence on renal dialysis: Secondary | ICD-10-CM | POA: Diagnosis not present

## 2018-10-16 DIAGNOSIS — D509 Iron deficiency anemia, unspecified: Secondary | ICD-10-CM | POA: Diagnosis not present

## 2018-10-16 DIAGNOSIS — D631 Anemia in chronic kidney disease: Secondary | ICD-10-CM | POA: Diagnosis not present

## 2018-10-16 DIAGNOSIS — Z23 Encounter for immunization: Secondary | ICD-10-CM | POA: Diagnosis not present

## 2018-10-16 DIAGNOSIS — E119 Type 2 diabetes mellitus without complications: Secondary | ICD-10-CM | POA: Diagnosis not present

## 2018-10-16 DIAGNOSIS — Z992 Dependence on renal dialysis: Secondary | ICD-10-CM | POA: Diagnosis not present

## 2018-10-16 DIAGNOSIS — N2581 Secondary hyperparathyroidism of renal origin: Secondary | ICD-10-CM | POA: Diagnosis not present

## 2018-10-16 DIAGNOSIS — N186 End stage renal disease: Secondary | ICD-10-CM | POA: Diagnosis not present

## 2018-10-18 DIAGNOSIS — Z992 Dependence on renal dialysis: Secondary | ICD-10-CM | POA: Diagnosis not present

## 2018-10-18 DIAGNOSIS — N2581 Secondary hyperparathyroidism of renal origin: Secondary | ICD-10-CM | POA: Diagnosis not present

## 2018-10-18 DIAGNOSIS — D631 Anemia in chronic kidney disease: Secondary | ICD-10-CM | POA: Diagnosis not present

## 2018-10-18 DIAGNOSIS — N186 End stage renal disease: Secondary | ICD-10-CM | POA: Diagnosis not present

## 2018-10-18 DIAGNOSIS — Z23 Encounter for immunization: Secondary | ICD-10-CM | POA: Diagnosis not present

## 2018-10-18 DIAGNOSIS — D509 Iron deficiency anemia, unspecified: Secondary | ICD-10-CM | POA: Diagnosis not present

## 2018-10-20 DIAGNOSIS — D631 Anemia in chronic kidney disease: Secondary | ICD-10-CM | POA: Diagnosis not present

## 2018-10-20 DIAGNOSIS — Z992 Dependence on renal dialysis: Secondary | ICD-10-CM | POA: Diagnosis not present

## 2018-10-20 DIAGNOSIS — N186 End stage renal disease: Secondary | ICD-10-CM | POA: Diagnosis not present

## 2018-10-20 DIAGNOSIS — D509 Iron deficiency anemia, unspecified: Secondary | ICD-10-CM | POA: Diagnosis not present

## 2018-10-20 DIAGNOSIS — Z23 Encounter for immunization: Secondary | ICD-10-CM | POA: Diagnosis not present

## 2018-10-20 DIAGNOSIS — N2581 Secondary hyperparathyroidism of renal origin: Secondary | ICD-10-CM | POA: Diagnosis not present

## 2018-10-23 DIAGNOSIS — Z23 Encounter for immunization: Secondary | ICD-10-CM | POA: Diagnosis not present

## 2018-10-23 DIAGNOSIS — N2581 Secondary hyperparathyroidism of renal origin: Secondary | ICD-10-CM | POA: Diagnosis not present

## 2018-10-23 DIAGNOSIS — N186 End stage renal disease: Secondary | ICD-10-CM | POA: Diagnosis not present

## 2018-10-23 DIAGNOSIS — D631 Anemia in chronic kidney disease: Secondary | ICD-10-CM | POA: Diagnosis not present

## 2018-10-23 DIAGNOSIS — Z992 Dependence on renal dialysis: Secondary | ICD-10-CM | POA: Diagnosis not present

## 2018-10-23 DIAGNOSIS — D509 Iron deficiency anemia, unspecified: Secondary | ICD-10-CM | POA: Diagnosis not present

## 2018-10-25 DIAGNOSIS — N2581 Secondary hyperparathyroidism of renal origin: Secondary | ICD-10-CM | POA: Diagnosis not present

## 2018-10-25 DIAGNOSIS — Z992 Dependence on renal dialysis: Secondary | ICD-10-CM | POA: Diagnosis not present

## 2018-10-25 DIAGNOSIS — N186 End stage renal disease: Secondary | ICD-10-CM | POA: Diagnosis not present

## 2018-10-25 DIAGNOSIS — D631 Anemia in chronic kidney disease: Secondary | ICD-10-CM | POA: Diagnosis not present

## 2018-10-25 DIAGNOSIS — D509 Iron deficiency anemia, unspecified: Secondary | ICD-10-CM | POA: Diagnosis not present

## 2018-10-25 DIAGNOSIS — Z23 Encounter for immunization: Secondary | ICD-10-CM | POA: Diagnosis not present

## 2018-10-27 DIAGNOSIS — N2581 Secondary hyperparathyroidism of renal origin: Secondary | ICD-10-CM | POA: Diagnosis not present

## 2018-10-27 DIAGNOSIS — Z992 Dependence on renal dialysis: Secondary | ICD-10-CM | POA: Diagnosis not present

## 2018-10-27 DIAGNOSIS — D631 Anemia in chronic kidney disease: Secondary | ICD-10-CM | POA: Diagnosis not present

## 2018-10-27 DIAGNOSIS — Z23 Encounter for immunization: Secondary | ICD-10-CM | POA: Diagnosis not present

## 2018-10-27 DIAGNOSIS — N186 End stage renal disease: Secondary | ICD-10-CM | POA: Diagnosis not present

## 2018-10-27 DIAGNOSIS — D509 Iron deficiency anemia, unspecified: Secondary | ICD-10-CM | POA: Diagnosis not present

## 2018-10-30 DIAGNOSIS — Z23 Encounter for immunization: Secondary | ICD-10-CM | POA: Diagnosis not present

## 2018-10-30 DIAGNOSIS — D509 Iron deficiency anemia, unspecified: Secondary | ICD-10-CM | POA: Diagnosis not present

## 2018-10-30 DIAGNOSIS — N186 End stage renal disease: Secondary | ICD-10-CM | POA: Diagnosis not present

## 2018-10-30 DIAGNOSIS — Z992 Dependence on renal dialysis: Secondary | ICD-10-CM | POA: Diagnosis not present

## 2018-10-30 DIAGNOSIS — D631 Anemia in chronic kidney disease: Secondary | ICD-10-CM | POA: Diagnosis not present

## 2018-10-30 DIAGNOSIS — N2581 Secondary hyperparathyroidism of renal origin: Secondary | ICD-10-CM | POA: Diagnosis not present

## 2018-11-01 DIAGNOSIS — N2581 Secondary hyperparathyroidism of renal origin: Secondary | ICD-10-CM | POA: Diagnosis not present

## 2018-11-01 DIAGNOSIS — D509 Iron deficiency anemia, unspecified: Secondary | ICD-10-CM | POA: Diagnosis not present

## 2018-11-01 DIAGNOSIS — Z23 Encounter for immunization: Secondary | ICD-10-CM | POA: Diagnosis not present

## 2018-11-01 DIAGNOSIS — N186 End stage renal disease: Secondary | ICD-10-CM | POA: Diagnosis not present

## 2018-11-01 DIAGNOSIS — Z992 Dependence on renal dialysis: Secondary | ICD-10-CM | POA: Diagnosis not present

## 2018-11-01 DIAGNOSIS — D631 Anemia in chronic kidney disease: Secondary | ICD-10-CM | POA: Diagnosis not present

## 2018-11-03 DIAGNOSIS — Z23 Encounter for immunization: Secondary | ICD-10-CM | POA: Diagnosis not present

## 2018-11-03 DIAGNOSIS — N2581 Secondary hyperparathyroidism of renal origin: Secondary | ICD-10-CM | POA: Diagnosis not present

## 2018-11-03 DIAGNOSIS — Z992 Dependence on renal dialysis: Secondary | ICD-10-CM | POA: Diagnosis not present

## 2018-11-03 DIAGNOSIS — D509 Iron deficiency anemia, unspecified: Secondary | ICD-10-CM | POA: Diagnosis not present

## 2018-11-03 DIAGNOSIS — N186 End stage renal disease: Secondary | ICD-10-CM | POA: Diagnosis not present

## 2018-11-03 DIAGNOSIS — D631 Anemia in chronic kidney disease: Secondary | ICD-10-CM | POA: Diagnosis not present

## 2018-11-04 DIAGNOSIS — N186 End stage renal disease: Secondary | ICD-10-CM | POA: Diagnosis not present

## 2018-11-04 DIAGNOSIS — Z992 Dependence on renal dialysis: Secondary | ICD-10-CM | POA: Diagnosis not present

## 2018-11-06 DIAGNOSIS — D509 Iron deficiency anemia, unspecified: Secondary | ICD-10-CM | POA: Diagnosis not present

## 2018-11-06 DIAGNOSIS — D631 Anemia in chronic kidney disease: Secondary | ICD-10-CM | POA: Diagnosis not present

## 2018-11-06 DIAGNOSIS — N186 End stage renal disease: Secondary | ICD-10-CM | POA: Diagnosis not present

## 2018-11-06 DIAGNOSIS — Z992 Dependence on renal dialysis: Secondary | ICD-10-CM | POA: Diagnosis not present

## 2018-11-06 DIAGNOSIS — N2581 Secondary hyperparathyroidism of renal origin: Secondary | ICD-10-CM | POA: Diagnosis not present

## 2018-11-08 DIAGNOSIS — Z992 Dependence on renal dialysis: Secondary | ICD-10-CM | POA: Diagnosis not present

## 2018-11-08 DIAGNOSIS — N186 End stage renal disease: Secondary | ICD-10-CM | POA: Diagnosis not present

## 2018-11-08 DIAGNOSIS — D509 Iron deficiency anemia, unspecified: Secondary | ICD-10-CM | POA: Diagnosis not present

## 2018-11-08 DIAGNOSIS — D631 Anemia in chronic kidney disease: Secondary | ICD-10-CM | POA: Diagnosis not present

## 2018-11-08 DIAGNOSIS — N2581 Secondary hyperparathyroidism of renal origin: Secondary | ICD-10-CM | POA: Diagnosis not present

## 2018-11-10 DIAGNOSIS — D509 Iron deficiency anemia, unspecified: Secondary | ICD-10-CM | POA: Diagnosis not present

## 2018-11-10 DIAGNOSIS — N2581 Secondary hyperparathyroidism of renal origin: Secondary | ICD-10-CM | POA: Diagnosis not present

## 2018-11-10 DIAGNOSIS — N186 End stage renal disease: Secondary | ICD-10-CM | POA: Diagnosis not present

## 2018-11-10 DIAGNOSIS — Z992 Dependence on renal dialysis: Secondary | ICD-10-CM | POA: Diagnosis not present

## 2018-11-10 DIAGNOSIS — D631 Anemia in chronic kidney disease: Secondary | ICD-10-CM | POA: Diagnosis not present

## 2018-11-13 DIAGNOSIS — N2581 Secondary hyperparathyroidism of renal origin: Secondary | ICD-10-CM | POA: Diagnosis not present

## 2018-11-13 DIAGNOSIS — N186 End stage renal disease: Secondary | ICD-10-CM | POA: Diagnosis not present

## 2018-11-13 DIAGNOSIS — D509 Iron deficiency anemia, unspecified: Secondary | ICD-10-CM | POA: Diagnosis not present

## 2018-11-13 DIAGNOSIS — Z992 Dependence on renal dialysis: Secondary | ICD-10-CM | POA: Diagnosis not present

## 2018-11-13 DIAGNOSIS — D631 Anemia in chronic kidney disease: Secondary | ICD-10-CM | POA: Diagnosis not present

## 2018-11-15 DIAGNOSIS — N2581 Secondary hyperparathyroidism of renal origin: Secondary | ICD-10-CM | POA: Diagnosis not present

## 2018-11-15 DIAGNOSIS — D631 Anemia in chronic kidney disease: Secondary | ICD-10-CM | POA: Diagnosis not present

## 2018-11-15 DIAGNOSIS — D509 Iron deficiency anemia, unspecified: Secondary | ICD-10-CM | POA: Diagnosis not present

## 2018-11-15 DIAGNOSIS — N186 End stage renal disease: Secondary | ICD-10-CM | POA: Diagnosis not present

## 2018-11-15 DIAGNOSIS — Z992 Dependence on renal dialysis: Secondary | ICD-10-CM | POA: Diagnosis not present

## 2018-11-17 DIAGNOSIS — D509 Iron deficiency anemia, unspecified: Secondary | ICD-10-CM | POA: Diagnosis not present

## 2018-11-17 DIAGNOSIS — N2581 Secondary hyperparathyroidism of renal origin: Secondary | ICD-10-CM | POA: Diagnosis not present

## 2018-11-17 DIAGNOSIS — Z992 Dependence on renal dialysis: Secondary | ICD-10-CM | POA: Diagnosis not present

## 2018-11-17 DIAGNOSIS — N186 End stage renal disease: Secondary | ICD-10-CM | POA: Diagnosis not present

## 2018-11-17 DIAGNOSIS — D631 Anemia in chronic kidney disease: Secondary | ICD-10-CM | POA: Diagnosis not present

## 2018-11-20 DIAGNOSIS — D631 Anemia in chronic kidney disease: Secondary | ICD-10-CM | POA: Diagnosis not present

## 2018-11-20 DIAGNOSIS — Z992 Dependence on renal dialysis: Secondary | ICD-10-CM | POA: Diagnosis not present

## 2018-11-20 DIAGNOSIS — N2581 Secondary hyperparathyroidism of renal origin: Secondary | ICD-10-CM | POA: Diagnosis not present

## 2018-11-20 DIAGNOSIS — N186 End stage renal disease: Secondary | ICD-10-CM | POA: Diagnosis not present

## 2018-11-20 DIAGNOSIS — D509 Iron deficiency anemia, unspecified: Secondary | ICD-10-CM | POA: Diagnosis not present

## 2018-11-22 DIAGNOSIS — D631 Anemia in chronic kidney disease: Secondary | ICD-10-CM | POA: Diagnosis not present

## 2018-11-22 DIAGNOSIS — Z992 Dependence on renal dialysis: Secondary | ICD-10-CM | POA: Diagnosis not present

## 2018-11-22 DIAGNOSIS — N186 End stage renal disease: Secondary | ICD-10-CM | POA: Diagnosis not present

## 2018-11-22 DIAGNOSIS — N2581 Secondary hyperparathyroidism of renal origin: Secondary | ICD-10-CM | POA: Diagnosis not present

## 2018-11-22 DIAGNOSIS — D509 Iron deficiency anemia, unspecified: Secondary | ICD-10-CM | POA: Diagnosis not present

## 2018-11-24 DIAGNOSIS — D631 Anemia in chronic kidney disease: Secondary | ICD-10-CM | POA: Diagnosis not present

## 2018-11-24 DIAGNOSIS — N2581 Secondary hyperparathyroidism of renal origin: Secondary | ICD-10-CM | POA: Diagnosis not present

## 2018-11-24 DIAGNOSIS — N186 End stage renal disease: Secondary | ICD-10-CM | POA: Diagnosis not present

## 2018-11-24 DIAGNOSIS — Z992 Dependence on renal dialysis: Secondary | ICD-10-CM | POA: Diagnosis not present

## 2018-11-24 DIAGNOSIS — D509 Iron deficiency anemia, unspecified: Secondary | ICD-10-CM | POA: Diagnosis not present

## 2018-11-27 DIAGNOSIS — Z992 Dependence on renal dialysis: Secondary | ICD-10-CM | POA: Diagnosis not present

## 2018-11-27 DIAGNOSIS — D631 Anemia in chronic kidney disease: Secondary | ICD-10-CM | POA: Diagnosis not present

## 2018-11-27 DIAGNOSIS — N2581 Secondary hyperparathyroidism of renal origin: Secondary | ICD-10-CM | POA: Diagnosis not present

## 2018-11-27 DIAGNOSIS — D509 Iron deficiency anemia, unspecified: Secondary | ICD-10-CM | POA: Diagnosis not present

## 2018-11-27 DIAGNOSIS — N186 End stage renal disease: Secondary | ICD-10-CM | POA: Diagnosis not present

## 2018-11-29 DIAGNOSIS — N186 End stage renal disease: Secondary | ICD-10-CM | POA: Diagnosis not present

## 2018-11-29 DIAGNOSIS — Z992 Dependence on renal dialysis: Secondary | ICD-10-CM | POA: Diagnosis not present

## 2018-11-29 DIAGNOSIS — D509 Iron deficiency anemia, unspecified: Secondary | ICD-10-CM | POA: Diagnosis not present

## 2018-11-29 DIAGNOSIS — D631 Anemia in chronic kidney disease: Secondary | ICD-10-CM | POA: Diagnosis not present

## 2018-11-29 DIAGNOSIS — N2581 Secondary hyperparathyroidism of renal origin: Secondary | ICD-10-CM | POA: Diagnosis not present

## 2018-12-02 DIAGNOSIS — N2581 Secondary hyperparathyroidism of renal origin: Secondary | ICD-10-CM | POA: Diagnosis not present

## 2018-12-02 DIAGNOSIS — D509 Iron deficiency anemia, unspecified: Secondary | ICD-10-CM | POA: Diagnosis not present

## 2018-12-02 DIAGNOSIS — N186 End stage renal disease: Secondary | ICD-10-CM | POA: Diagnosis not present

## 2018-12-02 DIAGNOSIS — D631 Anemia in chronic kidney disease: Secondary | ICD-10-CM | POA: Diagnosis not present

## 2018-12-02 DIAGNOSIS — Z992 Dependence on renal dialysis: Secondary | ICD-10-CM | POA: Diagnosis not present

## 2018-12-04 DIAGNOSIS — N186 End stage renal disease: Secondary | ICD-10-CM | POA: Diagnosis not present

## 2018-12-04 DIAGNOSIS — D509 Iron deficiency anemia, unspecified: Secondary | ICD-10-CM | POA: Diagnosis not present

## 2018-12-04 DIAGNOSIS — N2581 Secondary hyperparathyroidism of renal origin: Secondary | ICD-10-CM | POA: Diagnosis not present

## 2018-12-04 DIAGNOSIS — Z992 Dependence on renal dialysis: Secondary | ICD-10-CM | POA: Diagnosis not present

## 2018-12-04 DIAGNOSIS — D631 Anemia in chronic kidney disease: Secondary | ICD-10-CM | POA: Diagnosis not present

## 2018-12-06 DIAGNOSIS — D509 Iron deficiency anemia, unspecified: Secondary | ICD-10-CM | POA: Diagnosis not present

## 2018-12-06 DIAGNOSIS — N186 End stage renal disease: Secondary | ICD-10-CM | POA: Diagnosis not present

## 2018-12-06 DIAGNOSIS — D631 Anemia in chronic kidney disease: Secondary | ICD-10-CM | POA: Diagnosis not present

## 2018-12-06 DIAGNOSIS — N2581 Secondary hyperparathyroidism of renal origin: Secondary | ICD-10-CM | POA: Diagnosis not present

## 2018-12-06 DIAGNOSIS — Z992 Dependence on renal dialysis: Secondary | ICD-10-CM | POA: Diagnosis not present

## 2018-12-18 DIAGNOSIS — Z992 Dependence on renal dialysis: Secondary | ICD-10-CM | POA: Diagnosis not present

## 2019-01-04 DIAGNOSIS — Z992 Dependence on renal dialysis: Secondary | ICD-10-CM | POA: Diagnosis not present

## 2019-01-04 DIAGNOSIS — N186 End stage renal disease: Secondary | ICD-10-CM | POA: Diagnosis not present

## 2019-01-05 DIAGNOSIS — N2581 Secondary hyperparathyroidism of renal origin: Secondary | ICD-10-CM | POA: Diagnosis not present

## 2019-01-05 DIAGNOSIS — Z992 Dependence on renal dialysis: Secondary | ICD-10-CM | POA: Diagnosis not present

## 2019-01-05 DIAGNOSIS — N186 End stage renal disease: Secondary | ICD-10-CM | POA: Diagnosis not present

## 2019-01-05 DIAGNOSIS — D509 Iron deficiency anemia, unspecified: Secondary | ICD-10-CM | POA: Diagnosis not present

## 2019-01-05 DIAGNOSIS — D631 Anemia in chronic kidney disease: Secondary | ICD-10-CM | POA: Diagnosis not present

## 2019-01-08 DIAGNOSIS — Z992 Dependence on renal dialysis: Secondary | ICD-10-CM | POA: Diagnosis not present

## 2019-01-08 DIAGNOSIS — D631 Anemia in chronic kidney disease: Secondary | ICD-10-CM | POA: Diagnosis not present

## 2019-01-08 DIAGNOSIS — N186 End stage renal disease: Secondary | ICD-10-CM | POA: Diagnosis not present

## 2019-01-08 DIAGNOSIS — D509 Iron deficiency anemia, unspecified: Secondary | ICD-10-CM | POA: Diagnosis not present

## 2019-01-08 DIAGNOSIS — N2581 Secondary hyperparathyroidism of renal origin: Secondary | ICD-10-CM | POA: Diagnosis not present

## 2019-01-10 DIAGNOSIS — D509 Iron deficiency anemia, unspecified: Secondary | ICD-10-CM | POA: Diagnosis not present

## 2019-01-10 DIAGNOSIS — Z992 Dependence on renal dialysis: Secondary | ICD-10-CM | POA: Diagnosis not present

## 2019-01-10 DIAGNOSIS — N186 End stage renal disease: Secondary | ICD-10-CM | POA: Diagnosis not present

## 2019-01-10 DIAGNOSIS — N2581 Secondary hyperparathyroidism of renal origin: Secondary | ICD-10-CM | POA: Diagnosis not present

## 2019-01-10 DIAGNOSIS — D631 Anemia in chronic kidney disease: Secondary | ICD-10-CM | POA: Diagnosis not present

## 2019-01-12 DIAGNOSIS — Z992 Dependence on renal dialysis: Secondary | ICD-10-CM | POA: Diagnosis not present

## 2019-01-12 DIAGNOSIS — N186 End stage renal disease: Secondary | ICD-10-CM | POA: Diagnosis not present

## 2019-01-12 DIAGNOSIS — N2581 Secondary hyperparathyroidism of renal origin: Secondary | ICD-10-CM | POA: Diagnosis not present

## 2019-01-12 DIAGNOSIS — D631 Anemia in chronic kidney disease: Secondary | ICD-10-CM | POA: Diagnosis not present

## 2019-01-12 DIAGNOSIS — D509 Iron deficiency anemia, unspecified: Secondary | ICD-10-CM | POA: Diagnosis not present

## 2019-01-15 DIAGNOSIS — D631 Anemia in chronic kidney disease: Secondary | ICD-10-CM | POA: Diagnosis not present

## 2019-01-15 DIAGNOSIS — N2581 Secondary hyperparathyroidism of renal origin: Secondary | ICD-10-CM | POA: Diagnosis not present

## 2019-01-15 DIAGNOSIS — Z992 Dependence on renal dialysis: Secondary | ICD-10-CM | POA: Diagnosis not present

## 2019-01-15 DIAGNOSIS — N186 End stage renal disease: Secondary | ICD-10-CM | POA: Diagnosis not present

## 2019-01-15 DIAGNOSIS — E119 Type 2 diabetes mellitus without complications: Secondary | ICD-10-CM | POA: Diagnosis not present

## 2019-01-15 DIAGNOSIS — D509 Iron deficiency anemia, unspecified: Secondary | ICD-10-CM | POA: Diagnosis not present

## 2019-01-17 DIAGNOSIS — D631 Anemia in chronic kidney disease: Secondary | ICD-10-CM | POA: Diagnosis not present

## 2019-01-17 DIAGNOSIS — N186 End stage renal disease: Secondary | ICD-10-CM | POA: Diagnosis not present

## 2019-01-17 DIAGNOSIS — Z992 Dependence on renal dialysis: Secondary | ICD-10-CM | POA: Diagnosis not present

## 2019-01-17 DIAGNOSIS — D509 Iron deficiency anemia, unspecified: Secondary | ICD-10-CM | POA: Diagnosis not present

## 2019-01-17 DIAGNOSIS — N2581 Secondary hyperparathyroidism of renal origin: Secondary | ICD-10-CM | POA: Diagnosis not present

## 2019-01-19 DIAGNOSIS — N2581 Secondary hyperparathyroidism of renal origin: Secondary | ICD-10-CM | POA: Diagnosis not present

## 2019-01-19 DIAGNOSIS — Z992 Dependence on renal dialysis: Secondary | ICD-10-CM | POA: Diagnosis not present

## 2019-01-19 DIAGNOSIS — N186 End stage renal disease: Secondary | ICD-10-CM | POA: Diagnosis not present

## 2019-01-19 DIAGNOSIS — D631 Anemia in chronic kidney disease: Secondary | ICD-10-CM | POA: Diagnosis not present

## 2019-01-19 DIAGNOSIS — D509 Iron deficiency anemia, unspecified: Secondary | ICD-10-CM | POA: Diagnosis not present

## 2019-01-22 DIAGNOSIS — D631 Anemia in chronic kidney disease: Secondary | ICD-10-CM | POA: Diagnosis not present

## 2019-01-22 DIAGNOSIS — D509 Iron deficiency anemia, unspecified: Secondary | ICD-10-CM | POA: Diagnosis not present

## 2019-01-22 DIAGNOSIS — Z992 Dependence on renal dialysis: Secondary | ICD-10-CM | POA: Diagnosis not present

## 2019-01-22 DIAGNOSIS — N186 End stage renal disease: Secondary | ICD-10-CM | POA: Diagnosis not present

## 2019-01-22 DIAGNOSIS — N2581 Secondary hyperparathyroidism of renal origin: Secondary | ICD-10-CM | POA: Diagnosis not present

## 2019-01-24 DIAGNOSIS — D631 Anemia in chronic kidney disease: Secondary | ICD-10-CM | POA: Diagnosis not present

## 2019-01-24 DIAGNOSIS — D509 Iron deficiency anemia, unspecified: Secondary | ICD-10-CM | POA: Diagnosis not present

## 2019-01-24 DIAGNOSIS — Z992 Dependence on renal dialysis: Secondary | ICD-10-CM | POA: Diagnosis not present

## 2019-01-24 DIAGNOSIS — N2581 Secondary hyperparathyroidism of renal origin: Secondary | ICD-10-CM | POA: Diagnosis not present

## 2019-01-24 DIAGNOSIS — N186 End stage renal disease: Secondary | ICD-10-CM | POA: Diagnosis not present

## 2019-01-26 DIAGNOSIS — N2581 Secondary hyperparathyroidism of renal origin: Secondary | ICD-10-CM | POA: Diagnosis not present

## 2019-01-26 DIAGNOSIS — D631 Anemia in chronic kidney disease: Secondary | ICD-10-CM | POA: Diagnosis not present

## 2019-01-26 DIAGNOSIS — D509 Iron deficiency anemia, unspecified: Secondary | ICD-10-CM | POA: Diagnosis not present

## 2019-01-26 DIAGNOSIS — Z992 Dependence on renal dialysis: Secondary | ICD-10-CM | POA: Diagnosis not present

## 2019-01-26 DIAGNOSIS — N186 End stage renal disease: Secondary | ICD-10-CM | POA: Diagnosis not present

## 2019-01-29 DIAGNOSIS — D631 Anemia in chronic kidney disease: Secondary | ICD-10-CM | POA: Diagnosis not present

## 2019-01-29 DIAGNOSIS — N186 End stage renal disease: Secondary | ICD-10-CM | POA: Diagnosis not present

## 2019-01-29 DIAGNOSIS — Z992 Dependence on renal dialysis: Secondary | ICD-10-CM | POA: Diagnosis not present

## 2019-01-29 DIAGNOSIS — N2581 Secondary hyperparathyroidism of renal origin: Secondary | ICD-10-CM | POA: Diagnosis not present

## 2019-01-29 DIAGNOSIS — D509 Iron deficiency anemia, unspecified: Secondary | ICD-10-CM | POA: Diagnosis not present

## 2019-01-31 DIAGNOSIS — D509 Iron deficiency anemia, unspecified: Secondary | ICD-10-CM | POA: Diagnosis not present

## 2019-01-31 DIAGNOSIS — D631 Anemia in chronic kidney disease: Secondary | ICD-10-CM | POA: Diagnosis not present

## 2019-01-31 DIAGNOSIS — N186 End stage renal disease: Secondary | ICD-10-CM | POA: Diagnosis not present

## 2019-01-31 DIAGNOSIS — Z992 Dependence on renal dialysis: Secondary | ICD-10-CM | POA: Diagnosis not present

## 2019-01-31 DIAGNOSIS — N2581 Secondary hyperparathyroidism of renal origin: Secondary | ICD-10-CM | POA: Diagnosis not present

## 2019-02-02 DIAGNOSIS — N2581 Secondary hyperparathyroidism of renal origin: Secondary | ICD-10-CM | POA: Diagnosis not present

## 2019-02-02 DIAGNOSIS — D509 Iron deficiency anemia, unspecified: Secondary | ICD-10-CM | POA: Diagnosis not present

## 2019-02-02 DIAGNOSIS — Z992 Dependence on renal dialysis: Secondary | ICD-10-CM | POA: Diagnosis not present

## 2019-02-02 DIAGNOSIS — D631 Anemia in chronic kidney disease: Secondary | ICD-10-CM | POA: Diagnosis not present

## 2019-02-02 DIAGNOSIS — N186 End stage renal disease: Secondary | ICD-10-CM | POA: Diagnosis not present

## 2019-02-04 DIAGNOSIS — Z992 Dependence on renal dialysis: Secondary | ICD-10-CM | POA: Diagnosis not present

## 2019-02-04 DIAGNOSIS — N186 End stage renal disease: Secondary | ICD-10-CM | POA: Diagnosis not present

## 2019-02-05 DIAGNOSIS — Z992 Dependence on renal dialysis: Secondary | ICD-10-CM | POA: Diagnosis not present

## 2019-02-05 DIAGNOSIS — N2581 Secondary hyperparathyroidism of renal origin: Secondary | ICD-10-CM | POA: Diagnosis not present

## 2019-02-05 DIAGNOSIS — N186 End stage renal disease: Secondary | ICD-10-CM | POA: Diagnosis not present

## 2019-02-05 DIAGNOSIS — D509 Iron deficiency anemia, unspecified: Secondary | ICD-10-CM | POA: Diagnosis not present

## 2019-02-05 DIAGNOSIS — D631 Anemia in chronic kidney disease: Secondary | ICD-10-CM | POA: Diagnosis not present

## 2019-02-07 ENCOUNTER — Encounter: Payer: Self-pay | Admitting: Family Medicine

## 2019-02-07 DIAGNOSIS — N2581 Secondary hyperparathyroidism of renal origin: Secondary | ICD-10-CM | POA: Diagnosis not present

## 2019-02-07 DIAGNOSIS — D631 Anemia in chronic kidney disease: Secondary | ICD-10-CM | POA: Diagnosis not present

## 2019-02-07 DIAGNOSIS — Z992 Dependence on renal dialysis: Secondary | ICD-10-CM | POA: Diagnosis not present

## 2019-02-07 DIAGNOSIS — N186 End stage renal disease: Secondary | ICD-10-CM | POA: Diagnosis not present

## 2019-02-07 DIAGNOSIS — D509 Iron deficiency anemia, unspecified: Secondary | ICD-10-CM | POA: Diagnosis not present

## 2019-02-09 DIAGNOSIS — N2581 Secondary hyperparathyroidism of renal origin: Secondary | ICD-10-CM | POA: Diagnosis not present

## 2019-02-09 DIAGNOSIS — D631 Anemia in chronic kidney disease: Secondary | ICD-10-CM | POA: Diagnosis not present

## 2019-02-09 DIAGNOSIS — Z992 Dependence on renal dialysis: Secondary | ICD-10-CM | POA: Diagnosis not present

## 2019-02-09 DIAGNOSIS — D509 Iron deficiency anemia, unspecified: Secondary | ICD-10-CM | POA: Diagnosis not present

## 2019-02-09 DIAGNOSIS — N186 End stage renal disease: Secondary | ICD-10-CM | POA: Diagnosis not present

## 2019-02-12 DIAGNOSIS — Z992 Dependence on renal dialysis: Secondary | ICD-10-CM | POA: Diagnosis not present

## 2019-02-12 DIAGNOSIS — D509 Iron deficiency anemia, unspecified: Secondary | ICD-10-CM | POA: Diagnosis not present

## 2019-02-12 DIAGNOSIS — N2581 Secondary hyperparathyroidism of renal origin: Secondary | ICD-10-CM | POA: Diagnosis not present

## 2019-02-12 DIAGNOSIS — N186 End stage renal disease: Secondary | ICD-10-CM | POA: Diagnosis not present

## 2019-02-12 DIAGNOSIS — D631 Anemia in chronic kidney disease: Secondary | ICD-10-CM | POA: Diagnosis not present

## 2019-02-14 DIAGNOSIS — D631 Anemia in chronic kidney disease: Secondary | ICD-10-CM | POA: Diagnosis not present

## 2019-02-14 DIAGNOSIS — D509 Iron deficiency anemia, unspecified: Secondary | ICD-10-CM | POA: Diagnosis not present

## 2019-02-14 DIAGNOSIS — N186 End stage renal disease: Secondary | ICD-10-CM | POA: Diagnosis not present

## 2019-02-14 DIAGNOSIS — Z992 Dependence on renal dialysis: Secondary | ICD-10-CM | POA: Diagnosis not present

## 2019-02-14 DIAGNOSIS — N2581 Secondary hyperparathyroidism of renal origin: Secondary | ICD-10-CM | POA: Diagnosis not present

## 2019-02-15 ENCOUNTER — Other Ambulatory Visit: Payer: Self-pay | Admitting: Family Medicine

## 2019-02-16 DIAGNOSIS — D631 Anemia in chronic kidney disease: Secondary | ICD-10-CM | POA: Diagnosis not present

## 2019-02-16 DIAGNOSIS — D509 Iron deficiency anemia, unspecified: Secondary | ICD-10-CM | POA: Diagnosis not present

## 2019-02-16 DIAGNOSIS — N2581 Secondary hyperparathyroidism of renal origin: Secondary | ICD-10-CM | POA: Diagnosis not present

## 2019-02-16 DIAGNOSIS — Z992 Dependence on renal dialysis: Secondary | ICD-10-CM | POA: Diagnosis not present

## 2019-02-16 DIAGNOSIS — N186 End stage renal disease: Secondary | ICD-10-CM | POA: Diagnosis not present

## 2019-02-16 NOTE — Telephone Encounter (Signed)
Med check 04/27/18

## 2019-02-16 NOTE — Telephone Encounter (Signed)
lvm to schedule cpe in April

## 2019-02-16 NOTE — Telephone Encounter (Signed)
Ok times one, rec ck up or pe in april

## 2019-02-16 NOTE — Telephone Encounter (Signed)
Please schedule and then route back to nurses 

## 2019-02-19 DIAGNOSIS — Z992 Dependence on renal dialysis: Secondary | ICD-10-CM | POA: Diagnosis not present

## 2019-02-19 DIAGNOSIS — N2581 Secondary hyperparathyroidism of renal origin: Secondary | ICD-10-CM | POA: Diagnosis not present

## 2019-02-19 DIAGNOSIS — D509 Iron deficiency anemia, unspecified: Secondary | ICD-10-CM | POA: Diagnosis not present

## 2019-02-19 DIAGNOSIS — N186 End stage renal disease: Secondary | ICD-10-CM | POA: Diagnosis not present

## 2019-02-19 DIAGNOSIS — D631 Anemia in chronic kidney disease: Secondary | ICD-10-CM | POA: Diagnosis not present

## 2019-02-19 NOTE — Telephone Encounter (Signed)
Pt has phone appt for med check 02/20/2019

## 2019-02-20 ENCOUNTER — Ambulatory Visit (INDEPENDENT_AMBULATORY_CARE_PROVIDER_SITE_OTHER): Payer: Medicare Other | Admitting: Family Medicine

## 2019-02-20 ENCOUNTER — Other Ambulatory Visit: Payer: Self-pay

## 2019-02-20 VITALS — BP 110/78 | Ht 67.75 in | Wt 183.0 lb

## 2019-02-20 DIAGNOSIS — E785 Hyperlipidemia, unspecified: Secondary | ICD-10-CM

## 2019-02-20 DIAGNOSIS — Z125 Encounter for screening for malignant neoplasm of prostate: Secondary | ICD-10-CM | POA: Diagnosis not present

## 2019-02-20 DIAGNOSIS — E1140 Type 2 diabetes mellitus with diabetic neuropathy, unspecified: Secondary | ICD-10-CM

## 2019-02-20 DIAGNOSIS — Z794 Long term (current) use of insulin: Secondary | ICD-10-CM

## 2019-02-20 DIAGNOSIS — Z79899 Other long term (current) drug therapy: Secondary | ICD-10-CM

## 2019-02-20 DIAGNOSIS — I10 Essential (primary) hypertension: Secondary | ICD-10-CM | POA: Diagnosis not present

## 2019-02-20 MED ORDER — LORATADINE 10 MG PO TABS: 10.0000 mg | ORAL_TABLET | Freq: Every day | ORAL | 1 refills | Status: DC

## 2019-02-20 MED ORDER — CITALOPRAM HYDROBROMIDE 40 MG PO TABS: 40.0000 mg | ORAL_TABLET | Freq: Every day | ORAL | 5 refills | Status: DC

## 2019-02-20 NOTE — Progress Notes (Signed)
   Subjective:  Audio video  Patient ID: Paul Gonzalez, male    DOB: [birth year], 54 y.o.   MRN: [id number]  Diabetes He presents for his follow-up diabetic visit. He has type 2 diabetes mellitus.   Last a1c feb 10th at diaylsis and it was 7.9.  Would like to discuss increasing celexa.   Virtual Visit via Telephone Note  I connected with Paul Gonzalez on 02/20/19 at 11:00 AM EST by telephone and verified that I am speaking with the correct person using two identifiers.  Location: Patient: home Provider: office   I discussed the limitations, risks, security and privacy concerns of performing an evaluation and management service by telephone and the availability of in person appointments. I also discussed with the patient that there may be a patient responsible charge related to this service. The patient expressed understanding and agreed to proceed.   History of Present Illness:    Observations/Objective:   Assessment and Plan:   Follow Up Instructions:    I discussed the assessment and treatment plan with the patient. The patient was provided an opportunity to ask questions and all were answered. The patient agreed with the plan and demonstrated an understanding of the instructions.   The patient was advised to call back or seek an in-person evaluation if the symptoms worsen or if the condition fails to improve as anticipated.  I provided 51minutes of non-face-to-face time during this encounter.      Review of Systems No headache no chest pain no shortness of breath    Objective:   Physical Exam  Virtual      Assessment & Plan:  Impression 1 type 2 diabetes.  Exact control uncertain.  A1c reportedly 7.9 at dialysis.  Patient has history of fairly profound noncompliance.  Per patient spouse eating just about anything he wants these days.  Await all results.  2.  History of hypertension.  Not on any medications now.  Blood pressure is good at dialysis  3.   Mood disorder.  Worsening per patient's wife.  Discussed.  Increase Celexa to 40 mg daily  Appropriate blood work medications refilled follow-up in 6 months diet exercise discussed.

## 2019-02-21 DIAGNOSIS — D631 Anemia in chronic kidney disease: Secondary | ICD-10-CM | POA: Diagnosis not present

## 2019-02-21 DIAGNOSIS — D509 Iron deficiency anemia, unspecified: Secondary | ICD-10-CM | POA: Diagnosis not present

## 2019-02-21 DIAGNOSIS — Z992 Dependence on renal dialysis: Secondary | ICD-10-CM | POA: Diagnosis not present

## 2019-02-21 DIAGNOSIS — N186 End stage renal disease: Secondary | ICD-10-CM | POA: Diagnosis not present

## 2019-02-21 DIAGNOSIS — N2581 Secondary hyperparathyroidism of renal origin: Secondary | ICD-10-CM | POA: Diagnosis not present

## 2019-02-23 DIAGNOSIS — N2581 Secondary hyperparathyroidism of renal origin: Secondary | ICD-10-CM | POA: Diagnosis not present

## 2019-02-23 DIAGNOSIS — N186 End stage renal disease: Secondary | ICD-10-CM | POA: Diagnosis not present

## 2019-02-23 DIAGNOSIS — Z992 Dependence on renal dialysis: Secondary | ICD-10-CM | POA: Diagnosis not present

## 2019-02-23 DIAGNOSIS — D509 Iron deficiency anemia, unspecified: Secondary | ICD-10-CM | POA: Diagnosis not present

## 2019-02-23 DIAGNOSIS — D631 Anemia in chronic kidney disease: Secondary | ICD-10-CM | POA: Diagnosis not present

## 2019-02-26 DIAGNOSIS — N186 End stage renal disease: Secondary | ICD-10-CM | POA: Diagnosis not present

## 2019-02-26 DIAGNOSIS — D631 Anemia in chronic kidney disease: Secondary | ICD-10-CM | POA: Diagnosis not present

## 2019-02-26 DIAGNOSIS — D509 Iron deficiency anemia, unspecified: Secondary | ICD-10-CM | POA: Diagnosis not present

## 2019-02-26 DIAGNOSIS — N2581 Secondary hyperparathyroidism of renal origin: Secondary | ICD-10-CM | POA: Diagnosis not present

## 2019-02-26 DIAGNOSIS — Z992 Dependence on renal dialysis: Secondary | ICD-10-CM | POA: Diagnosis not present

## 2019-02-28 ENCOUNTER — Telehealth: Payer: Self-pay | Admitting: Family Medicine

## 2019-02-28 ENCOUNTER — Other Ambulatory Visit: Payer: Self-pay | Admitting: *Deleted

## 2019-02-28 DIAGNOSIS — D509 Iron deficiency anemia, unspecified: Secondary | ICD-10-CM | POA: Diagnosis not present

## 2019-02-28 DIAGNOSIS — Z992 Dependence on renal dialysis: Secondary | ICD-10-CM | POA: Diagnosis not present

## 2019-02-28 DIAGNOSIS — N186 End stage renal disease: Secondary | ICD-10-CM | POA: Diagnosis not present

## 2019-02-28 DIAGNOSIS — N2581 Secondary hyperparathyroidism of renal origin: Secondary | ICD-10-CM | POA: Diagnosis not present

## 2019-02-28 DIAGNOSIS — D631 Anemia in chronic kidney disease: Secondary | ICD-10-CM | POA: Diagnosis not present

## 2019-02-28 MED ORDER — OMEPRAZOLE 20 MG PO CPDR: 20.0000 mg | DELAYED_RELEASE_CAPSULE | ORAL | 11 refills | Status: DC

## 2019-02-28 NOTE — Telephone Encounter (Signed)
States he burps all the time and talked to dr Richardson Landry at last visit and was told something would be called in but never was.

## 2019-02-28 NOTE — Telephone Encounter (Signed)
Med sent to pharm and pt notified.  

## 2019-02-28 NOTE — Telephone Encounter (Signed)
Omeprazole 20 mg qam one yrs worth

## 2019-02-28 NOTE — Telephone Encounter (Signed)
Patient had appointment on 2/16 and requested something for acid reflux to be called in to [facility]

## 2019-03-02 DIAGNOSIS — N186 End stage renal disease: Secondary | ICD-10-CM | POA: Diagnosis not present

## 2019-03-02 DIAGNOSIS — D631 Anemia in chronic kidney disease: Secondary | ICD-10-CM | POA: Diagnosis not present

## 2019-03-02 DIAGNOSIS — D509 Iron deficiency anemia, unspecified: Secondary | ICD-10-CM | POA: Diagnosis not present

## 2019-03-02 DIAGNOSIS — N2581 Secondary hyperparathyroidism of renal origin: Secondary | ICD-10-CM | POA: Diagnosis not present

## 2019-03-02 DIAGNOSIS — Z992 Dependence on renal dialysis: Secondary | ICD-10-CM | POA: Diagnosis not present

## 2019-03-04 DIAGNOSIS — Z992 Dependence on renal dialysis: Secondary | ICD-10-CM | POA: Diagnosis not present

## 2019-03-04 DIAGNOSIS — N186 End stage renal disease: Secondary | ICD-10-CM | POA: Diagnosis not present

## 2019-03-05 DIAGNOSIS — D509 Iron deficiency anemia, unspecified: Secondary | ICD-10-CM | POA: Diagnosis not present

## 2019-03-05 DIAGNOSIS — Z992 Dependence on renal dialysis: Secondary | ICD-10-CM | POA: Diagnosis not present

## 2019-03-05 DIAGNOSIS — N2581 Secondary hyperparathyroidism of renal origin: Secondary | ICD-10-CM | POA: Diagnosis not present

## 2019-03-05 DIAGNOSIS — D631 Anemia in chronic kidney disease: Secondary | ICD-10-CM | POA: Diagnosis not present

## 2019-03-05 DIAGNOSIS — N186 End stage renal disease: Secondary | ICD-10-CM | POA: Diagnosis not present

## 2019-03-07 DIAGNOSIS — Z992 Dependence on renal dialysis: Secondary | ICD-10-CM | POA: Diagnosis not present

## 2019-03-07 DIAGNOSIS — D509 Iron deficiency anemia, unspecified: Secondary | ICD-10-CM | POA: Diagnosis not present

## 2019-03-07 DIAGNOSIS — N186 End stage renal disease: Secondary | ICD-10-CM | POA: Diagnosis not present

## 2019-03-07 DIAGNOSIS — D631 Anemia in chronic kidney disease: Secondary | ICD-10-CM | POA: Diagnosis not present

## 2019-03-07 DIAGNOSIS — N2581 Secondary hyperparathyroidism of renal origin: Secondary | ICD-10-CM | POA: Diagnosis not present

## 2019-03-09 DIAGNOSIS — D509 Iron deficiency anemia, unspecified: Secondary | ICD-10-CM | POA: Diagnosis not present

## 2019-03-09 DIAGNOSIS — D631 Anemia in chronic kidney disease: Secondary | ICD-10-CM | POA: Diagnosis not present

## 2019-03-09 DIAGNOSIS — N186 End stage renal disease: Secondary | ICD-10-CM | POA: Diagnosis not present

## 2019-03-09 DIAGNOSIS — Z992 Dependence on renal dialysis: Secondary | ICD-10-CM | POA: Diagnosis not present

## 2019-03-09 DIAGNOSIS — N2581 Secondary hyperparathyroidism of renal origin: Secondary | ICD-10-CM | POA: Diagnosis not present

## 2019-03-12 DIAGNOSIS — D631 Anemia in chronic kidney disease: Secondary | ICD-10-CM | POA: Diagnosis not present

## 2019-03-12 DIAGNOSIS — D509 Iron deficiency anemia, unspecified: Secondary | ICD-10-CM | POA: Diagnosis not present

## 2019-03-12 DIAGNOSIS — Z992 Dependence on renal dialysis: Secondary | ICD-10-CM | POA: Diagnosis not present

## 2019-03-12 DIAGNOSIS — N186 End stage renal disease: Secondary | ICD-10-CM | POA: Diagnosis not present

## 2019-03-12 DIAGNOSIS — N2581 Secondary hyperparathyroidism of renal origin: Secondary | ICD-10-CM | POA: Diagnosis not present

## 2019-03-14 DIAGNOSIS — N2581 Secondary hyperparathyroidism of renal origin: Secondary | ICD-10-CM | POA: Diagnosis not present

## 2019-03-14 DIAGNOSIS — Z992 Dependence on renal dialysis: Secondary | ICD-10-CM | POA: Diagnosis not present

## 2019-03-14 DIAGNOSIS — D509 Iron deficiency anemia, unspecified: Secondary | ICD-10-CM | POA: Diagnosis not present

## 2019-03-14 DIAGNOSIS — D631 Anemia in chronic kidney disease: Secondary | ICD-10-CM | POA: Diagnosis not present

## 2019-03-14 DIAGNOSIS — N186 End stage renal disease: Secondary | ICD-10-CM | POA: Diagnosis not present

## 2019-03-16 DIAGNOSIS — N2581 Secondary hyperparathyroidism of renal origin: Secondary | ICD-10-CM | POA: Diagnosis not present

## 2019-03-16 DIAGNOSIS — D631 Anemia in chronic kidney disease: Secondary | ICD-10-CM | POA: Diagnosis not present

## 2019-03-16 DIAGNOSIS — D509 Iron deficiency anemia, unspecified: Secondary | ICD-10-CM | POA: Diagnosis not present

## 2019-03-16 DIAGNOSIS — N186 End stage renal disease: Secondary | ICD-10-CM | POA: Diagnosis not present

## 2019-03-16 DIAGNOSIS — Z992 Dependence on renal dialysis: Secondary | ICD-10-CM | POA: Diagnosis not present

## 2019-03-19 DIAGNOSIS — D631 Anemia in chronic kidney disease: Secondary | ICD-10-CM | POA: Diagnosis not present

## 2019-03-19 DIAGNOSIS — Z992 Dependence on renal dialysis: Secondary | ICD-10-CM | POA: Diagnosis not present

## 2019-03-19 DIAGNOSIS — D509 Iron deficiency anemia, unspecified: Secondary | ICD-10-CM | POA: Diagnosis not present

## 2019-03-19 DIAGNOSIS — N186 End stage renal disease: Secondary | ICD-10-CM | POA: Diagnosis not present

## 2019-03-19 DIAGNOSIS — N2581 Secondary hyperparathyroidism of renal origin: Secondary | ICD-10-CM | POA: Diagnosis not present

## 2019-03-21 DIAGNOSIS — D631 Anemia in chronic kidney disease: Secondary | ICD-10-CM | POA: Diagnosis not present

## 2019-03-21 DIAGNOSIS — N186 End stage renal disease: Secondary | ICD-10-CM | POA: Diagnosis not present

## 2019-03-21 DIAGNOSIS — D509 Iron deficiency anemia, unspecified: Secondary | ICD-10-CM | POA: Diagnosis not present

## 2019-03-21 DIAGNOSIS — Z992 Dependence on renal dialysis: Secondary | ICD-10-CM | POA: Diagnosis not present

## 2019-03-21 DIAGNOSIS — N2581 Secondary hyperparathyroidism of renal origin: Secondary | ICD-10-CM | POA: Diagnosis not present

## 2019-03-23 DIAGNOSIS — Z992 Dependence on renal dialysis: Secondary | ICD-10-CM | POA: Diagnosis not present

## 2019-03-23 DIAGNOSIS — D631 Anemia in chronic kidney disease: Secondary | ICD-10-CM | POA: Diagnosis not present

## 2019-03-23 DIAGNOSIS — N2581 Secondary hyperparathyroidism of renal origin: Secondary | ICD-10-CM | POA: Diagnosis not present

## 2019-03-23 DIAGNOSIS — D509 Iron deficiency anemia, unspecified: Secondary | ICD-10-CM | POA: Diagnosis not present

## 2019-03-23 DIAGNOSIS — N186 End stage renal disease: Secondary | ICD-10-CM | POA: Diagnosis not present

## 2019-03-26 DIAGNOSIS — N2581 Secondary hyperparathyroidism of renal origin: Secondary | ICD-10-CM | POA: Diagnosis not present

## 2019-03-26 DIAGNOSIS — D509 Iron deficiency anemia, unspecified: Secondary | ICD-10-CM | POA: Diagnosis not present

## 2019-03-26 DIAGNOSIS — Z992 Dependence on renal dialysis: Secondary | ICD-10-CM | POA: Diagnosis not present

## 2019-03-26 DIAGNOSIS — N186 End stage renal disease: Secondary | ICD-10-CM | POA: Diagnosis not present

## 2019-03-26 DIAGNOSIS — D631 Anemia in chronic kidney disease: Secondary | ICD-10-CM | POA: Diagnosis not present

## 2019-03-28 DIAGNOSIS — N2581 Secondary hyperparathyroidism of renal origin: Secondary | ICD-10-CM | POA: Diagnosis not present

## 2019-03-28 DIAGNOSIS — D509 Iron deficiency anemia, unspecified: Secondary | ICD-10-CM | POA: Diagnosis not present

## 2019-03-28 DIAGNOSIS — N186 End stage renal disease: Secondary | ICD-10-CM | POA: Diagnosis not present

## 2019-03-28 DIAGNOSIS — D631 Anemia in chronic kidney disease: Secondary | ICD-10-CM | POA: Diagnosis not present

## 2019-03-28 DIAGNOSIS — Z992 Dependence on renal dialysis: Secondary | ICD-10-CM | POA: Diagnosis not present

## 2019-03-30 DIAGNOSIS — N2581 Secondary hyperparathyroidism of renal origin: Secondary | ICD-10-CM | POA: Diagnosis not present

## 2019-03-30 DIAGNOSIS — N186 End stage renal disease: Secondary | ICD-10-CM | POA: Diagnosis not present

## 2019-03-30 DIAGNOSIS — Z992 Dependence on renal dialysis: Secondary | ICD-10-CM | POA: Diagnosis not present

## 2019-03-30 DIAGNOSIS — D631 Anemia in chronic kidney disease: Secondary | ICD-10-CM | POA: Diagnosis not present

## 2019-03-30 DIAGNOSIS — D509 Iron deficiency anemia, unspecified: Secondary | ICD-10-CM | POA: Diagnosis not present

## 2019-04-02 DIAGNOSIS — D509 Iron deficiency anemia, unspecified: Secondary | ICD-10-CM | POA: Diagnosis not present

## 2019-04-02 DIAGNOSIS — N2581 Secondary hyperparathyroidism of renal origin: Secondary | ICD-10-CM | POA: Diagnosis not present

## 2019-04-02 DIAGNOSIS — N186 End stage renal disease: Secondary | ICD-10-CM | POA: Diagnosis not present

## 2019-04-02 DIAGNOSIS — Z992 Dependence on renal dialysis: Secondary | ICD-10-CM | POA: Diagnosis not present

## 2019-04-02 DIAGNOSIS — D631 Anemia in chronic kidney disease: Secondary | ICD-10-CM | POA: Diagnosis not present

## 2019-04-04 DIAGNOSIS — D509 Iron deficiency anemia, unspecified: Secondary | ICD-10-CM | POA: Diagnosis not present

## 2019-04-04 DIAGNOSIS — D631 Anemia in chronic kidney disease: Secondary | ICD-10-CM | POA: Diagnosis not present

## 2019-04-04 DIAGNOSIS — N2581 Secondary hyperparathyroidism of renal origin: Secondary | ICD-10-CM | POA: Diagnosis not present

## 2019-04-04 DIAGNOSIS — Z992 Dependence on renal dialysis: Secondary | ICD-10-CM | POA: Diagnosis not present

## 2019-04-04 DIAGNOSIS — N186 End stage renal disease: Secondary | ICD-10-CM | POA: Diagnosis not present

## 2019-04-06 DIAGNOSIS — Z992 Dependence on renal dialysis: Secondary | ICD-10-CM | POA: Diagnosis not present

## 2019-04-06 DIAGNOSIS — N186 End stage renal disease: Secondary | ICD-10-CM | POA: Diagnosis not present

## 2019-04-06 DIAGNOSIS — N2581 Secondary hyperparathyroidism of renal origin: Secondary | ICD-10-CM | POA: Diagnosis not present

## 2019-04-06 DIAGNOSIS — D631 Anemia in chronic kidney disease: Secondary | ICD-10-CM | POA: Diagnosis not present

## 2019-04-06 DIAGNOSIS — D509 Iron deficiency anemia, unspecified: Secondary | ICD-10-CM | POA: Diagnosis not present

## 2019-04-09 DIAGNOSIS — Z992 Dependence on renal dialysis: Secondary | ICD-10-CM | POA: Diagnosis not present

## 2019-04-09 DIAGNOSIS — D509 Iron deficiency anemia, unspecified: Secondary | ICD-10-CM | POA: Diagnosis not present

## 2019-04-09 DIAGNOSIS — D631 Anemia in chronic kidney disease: Secondary | ICD-10-CM | POA: Diagnosis not present

## 2019-04-09 DIAGNOSIS — N186 End stage renal disease: Secondary | ICD-10-CM | POA: Diagnosis not present

## 2019-04-09 DIAGNOSIS — N2581 Secondary hyperparathyroidism of renal origin: Secondary | ICD-10-CM | POA: Diagnosis not present

## 2019-04-11 DIAGNOSIS — D509 Iron deficiency anemia, unspecified: Secondary | ICD-10-CM | POA: Diagnosis not present

## 2019-04-11 DIAGNOSIS — D631 Anemia in chronic kidney disease: Secondary | ICD-10-CM | POA: Diagnosis not present

## 2019-04-11 DIAGNOSIS — N186 End stage renal disease: Secondary | ICD-10-CM | POA: Diagnosis not present

## 2019-04-11 DIAGNOSIS — N2581 Secondary hyperparathyroidism of renal origin: Secondary | ICD-10-CM | POA: Diagnosis not present

## 2019-04-11 DIAGNOSIS — Z992 Dependence on renal dialysis: Secondary | ICD-10-CM | POA: Diagnosis not present

## 2019-04-13 DIAGNOSIS — N186 End stage renal disease: Secondary | ICD-10-CM | POA: Diagnosis not present

## 2019-04-13 DIAGNOSIS — Z992 Dependence on renal dialysis: Secondary | ICD-10-CM | POA: Diagnosis not present

## 2019-04-13 DIAGNOSIS — N2581 Secondary hyperparathyroidism of renal origin: Secondary | ICD-10-CM | POA: Diagnosis not present

## 2019-04-13 DIAGNOSIS — D509 Iron deficiency anemia, unspecified: Secondary | ICD-10-CM | POA: Diagnosis not present

## 2019-04-13 DIAGNOSIS — D631 Anemia in chronic kidney disease: Secondary | ICD-10-CM | POA: Diagnosis not present

## 2019-04-16 DIAGNOSIS — N2581 Secondary hyperparathyroidism of renal origin: Secondary | ICD-10-CM | POA: Diagnosis not present

## 2019-04-16 DIAGNOSIS — D631 Anemia in chronic kidney disease: Secondary | ICD-10-CM | POA: Diagnosis not present

## 2019-04-16 DIAGNOSIS — N186 End stage renal disease: Secondary | ICD-10-CM | POA: Diagnosis not present

## 2019-04-16 DIAGNOSIS — E119 Type 2 diabetes mellitus without complications: Secondary | ICD-10-CM | POA: Diagnosis not present

## 2019-04-16 DIAGNOSIS — Z992 Dependence on renal dialysis: Secondary | ICD-10-CM | POA: Diagnosis not present

## 2019-04-16 DIAGNOSIS — D509 Iron deficiency anemia, unspecified: Secondary | ICD-10-CM | POA: Diagnosis not present

## 2019-04-18 DIAGNOSIS — Z992 Dependence on renal dialysis: Secondary | ICD-10-CM | POA: Diagnosis not present

## 2019-04-18 DIAGNOSIS — D509 Iron deficiency anemia, unspecified: Secondary | ICD-10-CM | POA: Diagnosis not present

## 2019-04-18 DIAGNOSIS — D631 Anemia in chronic kidney disease: Secondary | ICD-10-CM | POA: Diagnosis not present

## 2019-04-18 DIAGNOSIS — N186 End stage renal disease: Secondary | ICD-10-CM | POA: Diagnosis not present

## 2019-04-18 DIAGNOSIS — N2581 Secondary hyperparathyroidism of renal origin: Secondary | ICD-10-CM | POA: Diagnosis not present

## 2019-04-20 DIAGNOSIS — D631 Anemia in chronic kidney disease: Secondary | ICD-10-CM | POA: Diagnosis not present

## 2019-04-20 DIAGNOSIS — Z992 Dependence on renal dialysis: Secondary | ICD-10-CM | POA: Diagnosis not present

## 2019-04-20 DIAGNOSIS — N186 End stage renal disease: Secondary | ICD-10-CM | POA: Diagnosis not present

## 2019-04-20 DIAGNOSIS — D509 Iron deficiency anemia, unspecified: Secondary | ICD-10-CM | POA: Diagnosis not present

## 2019-04-20 DIAGNOSIS — N2581 Secondary hyperparathyroidism of renal origin: Secondary | ICD-10-CM | POA: Diagnosis not present

## 2019-04-23 DIAGNOSIS — D631 Anemia in chronic kidney disease: Secondary | ICD-10-CM | POA: Diagnosis not present

## 2019-04-23 DIAGNOSIS — N2581 Secondary hyperparathyroidism of renal origin: Secondary | ICD-10-CM | POA: Diagnosis not present

## 2019-04-23 DIAGNOSIS — N186 End stage renal disease: Secondary | ICD-10-CM | POA: Diagnosis not present

## 2019-04-23 DIAGNOSIS — D509 Iron deficiency anemia, unspecified: Secondary | ICD-10-CM | POA: Diagnosis not present

## 2019-04-23 DIAGNOSIS — Z992 Dependence on renal dialysis: Secondary | ICD-10-CM | POA: Diagnosis not present

## 2019-04-25 DIAGNOSIS — N186 End stage renal disease: Secondary | ICD-10-CM | POA: Diagnosis not present

## 2019-04-25 DIAGNOSIS — N2581 Secondary hyperparathyroidism of renal origin: Secondary | ICD-10-CM | POA: Diagnosis not present

## 2019-04-25 DIAGNOSIS — D631 Anemia in chronic kidney disease: Secondary | ICD-10-CM | POA: Diagnosis not present

## 2019-04-25 DIAGNOSIS — Z992 Dependence on renal dialysis: Secondary | ICD-10-CM | POA: Diagnosis not present

## 2019-04-25 DIAGNOSIS — D509 Iron deficiency anemia, unspecified: Secondary | ICD-10-CM | POA: Diagnosis not present

## 2019-04-27 DIAGNOSIS — D509 Iron deficiency anemia, unspecified: Secondary | ICD-10-CM | POA: Diagnosis not present

## 2019-04-27 DIAGNOSIS — D631 Anemia in chronic kidney disease: Secondary | ICD-10-CM | POA: Diagnosis not present

## 2019-04-27 DIAGNOSIS — N2581 Secondary hyperparathyroidism of renal origin: Secondary | ICD-10-CM | POA: Diagnosis not present

## 2019-04-27 DIAGNOSIS — Z992 Dependence on renal dialysis: Secondary | ICD-10-CM | POA: Diagnosis not present

## 2019-04-27 DIAGNOSIS — N186 End stage renal disease: Secondary | ICD-10-CM | POA: Diagnosis not present

## 2019-04-30 DIAGNOSIS — Z992 Dependence on renal dialysis: Secondary | ICD-10-CM | POA: Diagnosis not present

## 2019-04-30 DIAGNOSIS — N2581 Secondary hyperparathyroidism of renal origin: Secondary | ICD-10-CM | POA: Diagnosis not present

## 2019-04-30 DIAGNOSIS — N186 End stage renal disease: Secondary | ICD-10-CM | POA: Diagnosis not present

## 2019-04-30 DIAGNOSIS — D509 Iron deficiency anemia, unspecified: Secondary | ICD-10-CM | POA: Diagnosis not present

## 2019-04-30 DIAGNOSIS — D631 Anemia in chronic kidney disease: Secondary | ICD-10-CM | POA: Diagnosis not present

## 2019-05-02 DIAGNOSIS — N2581 Secondary hyperparathyroidism of renal origin: Secondary | ICD-10-CM | POA: Diagnosis not present

## 2019-05-02 DIAGNOSIS — D631 Anemia in chronic kidney disease: Secondary | ICD-10-CM | POA: Diagnosis not present

## 2019-05-02 DIAGNOSIS — N186 End stage renal disease: Secondary | ICD-10-CM | POA: Diagnosis not present

## 2019-05-02 DIAGNOSIS — D509 Iron deficiency anemia, unspecified: Secondary | ICD-10-CM | POA: Diagnosis not present

## 2019-05-02 DIAGNOSIS — Z992 Dependence on renal dialysis: Secondary | ICD-10-CM | POA: Diagnosis not present

## 2019-05-04 DIAGNOSIS — Z992 Dependence on renal dialysis: Secondary | ICD-10-CM | POA: Diagnosis not present

## 2019-05-04 DIAGNOSIS — D631 Anemia in chronic kidney disease: Secondary | ICD-10-CM | POA: Diagnosis not present

## 2019-05-04 DIAGNOSIS — N2581 Secondary hyperparathyroidism of renal origin: Secondary | ICD-10-CM | POA: Diagnosis not present

## 2019-05-04 DIAGNOSIS — N186 End stage renal disease: Secondary | ICD-10-CM | POA: Diagnosis not present

## 2019-05-04 DIAGNOSIS — D509 Iron deficiency anemia, unspecified: Secondary | ICD-10-CM | POA: Diagnosis not present

## 2019-05-07 DIAGNOSIS — D509 Iron deficiency anemia, unspecified: Secondary | ICD-10-CM | POA: Diagnosis not present

## 2019-05-07 DIAGNOSIS — N2581 Secondary hyperparathyroidism of renal origin: Secondary | ICD-10-CM | POA: Diagnosis not present

## 2019-05-07 DIAGNOSIS — D631 Anemia in chronic kidney disease: Secondary | ICD-10-CM | POA: Diagnosis not present

## 2019-05-07 DIAGNOSIS — Z992 Dependence on renal dialysis: Secondary | ICD-10-CM | POA: Diagnosis not present

## 2019-05-07 DIAGNOSIS — N186 End stage renal disease: Secondary | ICD-10-CM | POA: Diagnosis not present

## 2019-05-09 DIAGNOSIS — Z992 Dependence on renal dialysis: Secondary | ICD-10-CM | POA: Diagnosis not present

## 2019-05-09 DIAGNOSIS — D631 Anemia in chronic kidney disease: Secondary | ICD-10-CM | POA: Diagnosis not present

## 2019-05-09 DIAGNOSIS — N186 End stage renal disease: Secondary | ICD-10-CM | POA: Diagnosis not present

## 2019-05-09 DIAGNOSIS — N2581 Secondary hyperparathyroidism of renal origin: Secondary | ICD-10-CM | POA: Diagnosis not present

## 2019-05-09 DIAGNOSIS — D509 Iron deficiency anemia, unspecified: Secondary | ICD-10-CM | POA: Diagnosis not present

## 2019-05-11 DIAGNOSIS — N186 End stage renal disease: Secondary | ICD-10-CM | POA: Diagnosis not present

## 2019-05-11 DIAGNOSIS — D509 Iron deficiency anemia, unspecified: Secondary | ICD-10-CM | POA: Diagnosis not present

## 2019-05-11 DIAGNOSIS — N2581 Secondary hyperparathyroidism of renal origin: Secondary | ICD-10-CM | POA: Diagnosis not present

## 2019-05-11 DIAGNOSIS — Z992 Dependence on renal dialysis: Secondary | ICD-10-CM | POA: Diagnosis not present

## 2019-05-11 DIAGNOSIS — D631 Anemia in chronic kidney disease: Secondary | ICD-10-CM | POA: Diagnosis not present

## 2019-05-14 DIAGNOSIS — D509 Iron deficiency anemia, unspecified: Secondary | ICD-10-CM | POA: Diagnosis not present

## 2019-05-14 DIAGNOSIS — N186 End stage renal disease: Secondary | ICD-10-CM | POA: Diagnosis not present

## 2019-05-14 DIAGNOSIS — Z992 Dependence on renal dialysis: Secondary | ICD-10-CM | POA: Diagnosis not present

## 2019-05-14 DIAGNOSIS — N2581 Secondary hyperparathyroidism of renal origin: Secondary | ICD-10-CM | POA: Diagnosis not present

## 2019-05-14 DIAGNOSIS — D631 Anemia in chronic kidney disease: Secondary | ICD-10-CM | POA: Diagnosis not present

## 2019-05-16 DIAGNOSIS — N186 End stage renal disease: Secondary | ICD-10-CM | POA: Diagnosis not present

## 2019-05-16 DIAGNOSIS — D631 Anemia in chronic kidney disease: Secondary | ICD-10-CM | POA: Diagnosis not present

## 2019-05-16 DIAGNOSIS — D509 Iron deficiency anemia, unspecified: Secondary | ICD-10-CM | POA: Diagnosis not present

## 2019-05-16 DIAGNOSIS — N2581 Secondary hyperparathyroidism of renal origin: Secondary | ICD-10-CM | POA: Diagnosis not present

## 2019-05-16 DIAGNOSIS — Z992 Dependence on renal dialysis: Secondary | ICD-10-CM | POA: Diagnosis not present

## 2019-05-18 DIAGNOSIS — D631 Anemia in chronic kidney disease: Secondary | ICD-10-CM | POA: Diagnosis not present

## 2019-05-18 DIAGNOSIS — D509 Iron deficiency anemia, unspecified: Secondary | ICD-10-CM | POA: Diagnosis not present

## 2019-05-18 DIAGNOSIS — N2581 Secondary hyperparathyroidism of renal origin: Secondary | ICD-10-CM | POA: Diagnosis not present

## 2019-05-18 DIAGNOSIS — Z992 Dependence on renal dialysis: Secondary | ICD-10-CM | POA: Diagnosis not present

## 2019-05-18 DIAGNOSIS — N186 End stage renal disease: Secondary | ICD-10-CM | POA: Diagnosis not present

## 2019-05-21 DIAGNOSIS — D509 Iron deficiency anemia, unspecified: Secondary | ICD-10-CM | POA: Diagnosis not present

## 2019-05-21 DIAGNOSIS — N186 End stage renal disease: Secondary | ICD-10-CM | POA: Diagnosis not present

## 2019-05-21 DIAGNOSIS — N2581 Secondary hyperparathyroidism of renal origin: Secondary | ICD-10-CM | POA: Diagnosis not present

## 2019-05-21 DIAGNOSIS — D631 Anemia in chronic kidney disease: Secondary | ICD-10-CM | POA: Diagnosis not present

## 2019-05-21 DIAGNOSIS — Z992 Dependence on renal dialysis: Secondary | ICD-10-CM | POA: Diagnosis not present

## 2019-05-23 DIAGNOSIS — N2581 Secondary hyperparathyroidism of renal origin: Secondary | ICD-10-CM | POA: Diagnosis not present

## 2019-05-23 DIAGNOSIS — D509 Iron deficiency anemia, unspecified: Secondary | ICD-10-CM | POA: Diagnosis not present

## 2019-05-23 DIAGNOSIS — N186 End stage renal disease: Secondary | ICD-10-CM | POA: Diagnosis not present

## 2019-05-23 DIAGNOSIS — D631 Anemia in chronic kidney disease: Secondary | ICD-10-CM | POA: Diagnosis not present

## 2019-05-23 DIAGNOSIS — Z992 Dependence on renal dialysis: Secondary | ICD-10-CM | POA: Diagnosis not present

## 2019-05-25 DIAGNOSIS — D631 Anemia in chronic kidney disease: Secondary | ICD-10-CM | POA: Diagnosis not present

## 2019-05-25 DIAGNOSIS — D509 Iron deficiency anemia, unspecified: Secondary | ICD-10-CM | POA: Diagnosis not present

## 2019-05-25 DIAGNOSIS — N186 End stage renal disease: Secondary | ICD-10-CM | POA: Diagnosis not present

## 2019-05-25 DIAGNOSIS — Z992 Dependence on renal dialysis: Secondary | ICD-10-CM | POA: Diagnosis not present

## 2019-05-25 DIAGNOSIS — N2581 Secondary hyperparathyroidism of renal origin: Secondary | ICD-10-CM | POA: Diagnosis not present

## 2019-05-28 DIAGNOSIS — D631 Anemia in chronic kidney disease: Secondary | ICD-10-CM | POA: Diagnosis not present

## 2019-05-28 DIAGNOSIS — D509 Iron deficiency anemia, unspecified: Secondary | ICD-10-CM | POA: Diagnosis not present

## 2019-05-28 DIAGNOSIS — N2581 Secondary hyperparathyroidism of renal origin: Secondary | ICD-10-CM | POA: Diagnosis not present

## 2019-05-28 DIAGNOSIS — Z992 Dependence on renal dialysis: Secondary | ICD-10-CM | POA: Diagnosis not present

## 2019-05-28 DIAGNOSIS — N186 End stage renal disease: Secondary | ICD-10-CM | POA: Diagnosis not present

## 2019-05-30 DIAGNOSIS — Z992 Dependence on renal dialysis: Secondary | ICD-10-CM | POA: Diagnosis not present

## 2019-05-30 DIAGNOSIS — N186 End stage renal disease: Secondary | ICD-10-CM | POA: Diagnosis not present

## 2019-05-30 DIAGNOSIS — N2581 Secondary hyperparathyroidism of renal origin: Secondary | ICD-10-CM | POA: Diagnosis not present

## 2019-05-30 DIAGNOSIS — D509 Iron deficiency anemia, unspecified: Secondary | ICD-10-CM | POA: Diagnosis not present

## 2019-05-30 DIAGNOSIS — D631 Anemia in chronic kidney disease: Secondary | ICD-10-CM | POA: Diagnosis not present

## 2019-06-01 DIAGNOSIS — D509 Iron deficiency anemia, unspecified: Secondary | ICD-10-CM | POA: Diagnosis not present

## 2019-06-01 DIAGNOSIS — D631 Anemia in chronic kidney disease: Secondary | ICD-10-CM | POA: Diagnosis not present

## 2019-06-01 DIAGNOSIS — N186 End stage renal disease: Secondary | ICD-10-CM | POA: Diagnosis not present

## 2019-06-01 DIAGNOSIS — Z992 Dependence on renal dialysis: Secondary | ICD-10-CM | POA: Diagnosis not present

## 2019-06-01 DIAGNOSIS — N2581 Secondary hyperparathyroidism of renal origin: Secondary | ICD-10-CM | POA: Diagnosis not present

## 2019-06-04 DIAGNOSIS — Z992 Dependence on renal dialysis: Secondary | ICD-10-CM | POA: Diagnosis not present

## 2019-06-04 DIAGNOSIS — D631 Anemia in chronic kidney disease: Secondary | ICD-10-CM | POA: Diagnosis not present

## 2019-06-04 DIAGNOSIS — D509 Iron deficiency anemia, unspecified: Secondary | ICD-10-CM | POA: Diagnosis not present

## 2019-06-04 DIAGNOSIS — N186 End stage renal disease: Secondary | ICD-10-CM | POA: Diagnosis not present

## 2019-06-04 DIAGNOSIS — N2581 Secondary hyperparathyroidism of renal origin: Secondary | ICD-10-CM | POA: Diagnosis not present

## 2019-06-06 DIAGNOSIS — N186 End stage renal disease: Secondary | ICD-10-CM | POA: Diagnosis not present

## 2019-06-06 DIAGNOSIS — Z992 Dependence on renal dialysis: Secondary | ICD-10-CM | POA: Diagnosis not present

## 2019-06-06 DIAGNOSIS — N2581 Secondary hyperparathyroidism of renal origin: Secondary | ICD-10-CM | POA: Diagnosis not present

## 2019-06-06 DIAGNOSIS — D631 Anemia in chronic kidney disease: Secondary | ICD-10-CM | POA: Diagnosis not present

## 2019-06-06 DIAGNOSIS — D509 Iron deficiency anemia, unspecified: Secondary | ICD-10-CM | POA: Diagnosis not present

## 2019-06-18 DIAGNOSIS — Z992 Dependence on renal dialysis: Secondary | ICD-10-CM | POA: Diagnosis not present

## 2019-07-04 DIAGNOSIS — Z992 Dependence on renal dialysis: Secondary | ICD-10-CM | POA: Diagnosis not present

## 2019-07-04 DIAGNOSIS — N186 End stage renal disease: Secondary | ICD-10-CM | POA: Diagnosis not present

## 2019-07-06 DIAGNOSIS — N2581 Secondary hyperparathyroidism of renal origin: Secondary | ICD-10-CM | POA: Diagnosis not present

## 2019-07-06 DIAGNOSIS — D509 Iron deficiency anemia, unspecified: Secondary | ICD-10-CM | POA: Diagnosis not present

## 2019-07-06 DIAGNOSIS — D631 Anemia in chronic kidney disease: Secondary | ICD-10-CM | POA: Diagnosis not present

## 2019-07-06 DIAGNOSIS — N186 End stage renal disease: Secondary | ICD-10-CM | POA: Diagnosis not present

## 2019-07-06 DIAGNOSIS — Z992 Dependence on renal dialysis: Secondary | ICD-10-CM | POA: Diagnosis not present

## 2019-07-09 DIAGNOSIS — N2581 Secondary hyperparathyroidism of renal origin: Secondary | ICD-10-CM | POA: Diagnosis not present

## 2019-07-09 DIAGNOSIS — D509 Iron deficiency anemia, unspecified: Secondary | ICD-10-CM | POA: Diagnosis not present

## 2019-07-09 DIAGNOSIS — N186 End stage renal disease: Secondary | ICD-10-CM | POA: Diagnosis not present

## 2019-07-09 DIAGNOSIS — Z992 Dependence on renal dialysis: Secondary | ICD-10-CM | POA: Diagnosis not present

## 2019-07-09 DIAGNOSIS — D631 Anemia in chronic kidney disease: Secondary | ICD-10-CM | POA: Diagnosis not present

## 2019-07-11 DIAGNOSIS — N186 End stage renal disease: Secondary | ICD-10-CM | POA: Diagnosis not present

## 2019-07-11 DIAGNOSIS — N2581 Secondary hyperparathyroidism of renal origin: Secondary | ICD-10-CM | POA: Diagnosis not present

## 2019-07-11 DIAGNOSIS — D631 Anemia in chronic kidney disease: Secondary | ICD-10-CM | POA: Diagnosis not present

## 2019-07-11 DIAGNOSIS — Z992 Dependence on renal dialysis: Secondary | ICD-10-CM | POA: Diagnosis not present

## 2019-07-11 DIAGNOSIS — D509 Iron deficiency anemia, unspecified: Secondary | ICD-10-CM | POA: Diagnosis not present

## 2019-07-13 DIAGNOSIS — Z992 Dependence on renal dialysis: Secondary | ICD-10-CM | POA: Diagnosis not present

## 2019-07-13 DIAGNOSIS — D509 Iron deficiency anemia, unspecified: Secondary | ICD-10-CM | POA: Diagnosis not present

## 2019-07-13 DIAGNOSIS — N186 End stage renal disease: Secondary | ICD-10-CM | POA: Diagnosis not present

## 2019-07-13 DIAGNOSIS — N2581 Secondary hyperparathyroidism of renal origin: Secondary | ICD-10-CM | POA: Diagnosis not present

## 2019-07-13 DIAGNOSIS — D631 Anemia in chronic kidney disease: Secondary | ICD-10-CM | POA: Diagnosis not present

## 2019-07-16 DIAGNOSIS — D509 Iron deficiency anemia, unspecified: Secondary | ICD-10-CM | POA: Diagnosis not present

## 2019-07-16 DIAGNOSIS — N2581 Secondary hyperparathyroidism of renal origin: Secondary | ICD-10-CM | POA: Diagnosis not present

## 2019-07-16 DIAGNOSIS — N186 End stage renal disease: Secondary | ICD-10-CM | POA: Diagnosis not present

## 2019-07-16 DIAGNOSIS — Z992 Dependence on renal dialysis: Secondary | ICD-10-CM | POA: Diagnosis not present

## 2019-07-16 DIAGNOSIS — D631 Anemia in chronic kidney disease: Secondary | ICD-10-CM | POA: Diagnosis not present

## 2019-07-16 DIAGNOSIS — E119 Type 2 diabetes mellitus without complications: Secondary | ICD-10-CM | POA: Diagnosis not present

## 2019-07-18 DIAGNOSIS — N2581 Secondary hyperparathyroidism of renal origin: Secondary | ICD-10-CM | POA: Diagnosis not present

## 2019-07-18 DIAGNOSIS — D509 Iron deficiency anemia, unspecified: Secondary | ICD-10-CM | POA: Diagnosis not present

## 2019-07-18 DIAGNOSIS — Z992 Dependence on renal dialysis: Secondary | ICD-10-CM | POA: Diagnosis not present

## 2019-07-18 DIAGNOSIS — D631 Anemia in chronic kidney disease: Secondary | ICD-10-CM | POA: Diagnosis not present

## 2019-07-18 DIAGNOSIS — N186 End stage renal disease: Secondary | ICD-10-CM | POA: Diagnosis not present

## 2019-07-20 DIAGNOSIS — Z992 Dependence on renal dialysis: Secondary | ICD-10-CM | POA: Diagnosis not present

## 2019-07-20 DIAGNOSIS — N2581 Secondary hyperparathyroidism of renal origin: Secondary | ICD-10-CM | POA: Diagnosis not present

## 2019-07-20 DIAGNOSIS — D509 Iron deficiency anemia, unspecified: Secondary | ICD-10-CM | POA: Diagnosis not present

## 2019-07-20 DIAGNOSIS — N186 End stage renal disease: Secondary | ICD-10-CM | POA: Diagnosis not present

## 2019-07-20 DIAGNOSIS — D631 Anemia in chronic kidney disease: Secondary | ICD-10-CM | POA: Diagnosis not present

## 2019-07-23 DIAGNOSIS — D631 Anemia in chronic kidney disease: Secondary | ICD-10-CM | POA: Diagnosis not present

## 2019-07-23 DIAGNOSIS — N186 End stage renal disease: Secondary | ICD-10-CM | POA: Diagnosis not present

## 2019-07-23 DIAGNOSIS — D509 Iron deficiency anemia, unspecified: Secondary | ICD-10-CM | POA: Diagnosis not present

## 2019-07-23 DIAGNOSIS — N2581 Secondary hyperparathyroidism of renal origin: Secondary | ICD-10-CM | POA: Diagnosis not present

## 2019-07-23 DIAGNOSIS — Z992 Dependence on renal dialysis: Secondary | ICD-10-CM | POA: Diagnosis not present

## 2019-07-25 DIAGNOSIS — Z992 Dependence on renal dialysis: Secondary | ICD-10-CM | POA: Diagnosis not present

## 2019-07-25 DIAGNOSIS — D631 Anemia in chronic kidney disease: Secondary | ICD-10-CM | POA: Diagnosis not present

## 2019-07-25 DIAGNOSIS — D509 Iron deficiency anemia, unspecified: Secondary | ICD-10-CM | POA: Diagnosis not present

## 2019-07-25 DIAGNOSIS — N2581 Secondary hyperparathyroidism of renal origin: Secondary | ICD-10-CM | POA: Diagnosis not present

## 2019-07-25 DIAGNOSIS — N186 End stage renal disease: Secondary | ICD-10-CM | POA: Diagnosis not present

## 2019-07-26 ENCOUNTER — Telehealth: Payer: Self-pay | Admitting: Family Medicine

## 2019-07-26 DIAGNOSIS — Z79899 Other long term (current) drug therapy: Secondary | ICD-10-CM

## 2019-07-26 DIAGNOSIS — Z794 Long term (current) use of insulin: Secondary | ICD-10-CM

## 2019-07-26 DIAGNOSIS — I10 Essential (primary) hypertension: Secondary | ICD-10-CM

## 2019-07-26 DIAGNOSIS — Z125 Encounter for screening for malignant neoplasm of prostate: Secondary | ICD-10-CM

## 2019-07-26 DIAGNOSIS — E785 Hyperlipidemia, unspecified: Secondary | ICD-10-CM

## 2019-07-26 MED ORDER — CITALOPRAM HYDROBROMIDE 40 MG PO TABS: 40.0000 mg | ORAL_TABLET | Freq: Every day | ORAL | 1 refills | Status: DC

## 2019-07-26 NOTE — Addendum Note (Signed)
Addended by: Carmelina Noun on: 07/26/2019 01:18 PM   Modules accepted: Orders

## 2019-07-26 NOTE — Telephone Encounter (Signed)
Pt states he gets some of this bw done at bloodwork and wants to hold off on doing it and will bring in his results when he comes and then see what he needs.

## 2019-07-26 NOTE — Addendum Note (Signed)
Addended by: Erven Colla on: 07/26/2019 12:12 PM   Modules accepted: Orders

## 2019-07-26 NOTE — Telephone Encounter (Signed)
Pt has 6 month med check scheduled for 08/30/2019, pt's citalopram (CELEXA) 40 MG tablet will run out before then (just got it filled 07/13/2019)  Can we order a refill until patient can be seen?   Please advise & call pt      [facility] [place]

## 2019-07-26 NOTE — Telephone Encounter (Signed)
Seen 02/20/19 for med check up. Labs put in that day and not done. Lipid, liver, bmp, a1c, psa. Has appt on 8/26 but will run out before then

## 2019-07-27 DIAGNOSIS — D631 Anemia in chronic kidney disease: Secondary | ICD-10-CM | POA: Diagnosis not present

## 2019-07-27 DIAGNOSIS — N186 End stage renal disease: Secondary | ICD-10-CM | POA: Diagnosis not present

## 2019-07-27 DIAGNOSIS — N2581 Secondary hyperparathyroidism of renal origin: Secondary | ICD-10-CM | POA: Diagnosis not present

## 2019-07-27 DIAGNOSIS — Z992 Dependence on renal dialysis: Secondary | ICD-10-CM | POA: Diagnosis not present

## 2019-07-27 DIAGNOSIS — D509 Iron deficiency anemia, unspecified: Secondary | ICD-10-CM | POA: Diagnosis not present

## 2019-07-30 DIAGNOSIS — N2581 Secondary hyperparathyroidism of renal origin: Secondary | ICD-10-CM | POA: Diagnosis not present

## 2019-07-30 DIAGNOSIS — D631 Anemia in chronic kidney disease: Secondary | ICD-10-CM | POA: Diagnosis not present

## 2019-07-30 DIAGNOSIS — N186 End stage renal disease: Secondary | ICD-10-CM | POA: Diagnosis not present

## 2019-07-30 DIAGNOSIS — D509 Iron deficiency anemia, unspecified: Secondary | ICD-10-CM | POA: Diagnosis not present

## 2019-07-30 DIAGNOSIS — Z992 Dependence on renal dialysis: Secondary | ICD-10-CM | POA: Diagnosis not present

## 2019-08-01 DIAGNOSIS — D631 Anemia in chronic kidney disease: Secondary | ICD-10-CM | POA: Diagnosis not present

## 2019-08-01 DIAGNOSIS — N186 End stage renal disease: Secondary | ICD-10-CM | POA: Diagnosis not present

## 2019-08-01 DIAGNOSIS — Z992 Dependence on renal dialysis: Secondary | ICD-10-CM | POA: Diagnosis not present

## 2019-08-01 DIAGNOSIS — N2581 Secondary hyperparathyroidism of renal origin: Secondary | ICD-10-CM | POA: Diagnosis not present

## 2019-08-01 DIAGNOSIS — D509 Iron deficiency anemia, unspecified: Secondary | ICD-10-CM | POA: Diagnosis not present

## 2019-08-03 DIAGNOSIS — D631 Anemia in chronic kidney disease: Secondary | ICD-10-CM | POA: Diagnosis not present

## 2019-08-03 DIAGNOSIS — N2581 Secondary hyperparathyroidism of renal origin: Secondary | ICD-10-CM | POA: Diagnosis not present

## 2019-08-03 DIAGNOSIS — D509 Iron deficiency anemia, unspecified: Secondary | ICD-10-CM | POA: Diagnosis not present

## 2019-08-03 DIAGNOSIS — N186 End stage renal disease: Secondary | ICD-10-CM | POA: Diagnosis not present

## 2019-08-03 DIAGNOSIS — Z992 Dependence on renal dialysis: Secondary | ICD-10-CM | POA: Diagnosis not present

## 2019-08-04 DIAGNOSIS — Z992 Dependence on renal dialysis: Secondary | ICD-10-CM | POA: Diagnosis not present

## 2019-08-04 DIAGNOSIS — N186 End stage renal disease: Secondary | ICD-10-CM | POA: Diagnosis not present

## 2019-08-06 DIAGNOSIS — D509 Iron deficiency anemia, unspecified: Secondary | ICD-10-CM | POA: Diagnosis not present

## 2019-08-06 DIAGNOSIS — N2581 Secondary hyperparathyroidism of renal origin: Secondary | ICD-10-CM | POA: Diagnosis not present

## 2019-08-06 DIAGNOSIS — N186 End stage renal disease: Secondary | ICD-10-CM | POA: Diagnosis not present

## 2019-08-06 DIAGNOSIS — Z992 Dependence on renal dialysis: Secondary | ICD-10-CM | POA: Diagnosis not present

## 2019-08-06 DIAGNOSIS — D631 Anemia in chronic kidney disease: Secondary | ICD-10-CM | POA: Diagnosis not present

## 2019-08-08 DIAGNOSIS — N186 End stage renal disease: Secondary | ICD-10-CM | POA: Diagnosis not present

## 2019-08-08 DIAGNOSIS — D509 Iron deficiency anemia, unspecified: Secondary | ICD-10-CM | POA: Diagnosis not present

## 2019-08-08 DIAGNOSIS — N2581 Secondary hyperparathyroidism of renal origin: Secondary | ICD-10-CM | POA: Diagnosis not present

## 2019-08-08 DIAGNOSIS — D631 Anemia in chronic kidney disease: Secondary | ICD-10-CM | POA: Diagnosis not present

## 2019-08-08 DIAGNOSIS — Z992 Dependence on renal dialysis: Secondary | ICD-10-CM | POA: Diagnosis not present

## 2019-08-10 DIAGNOSIS — N2581 Secondary hyperparathyroidism of renal origin: Secondary | ICD-10-CM | POA: Diagnosis not present

## 2019-08-10 DIAGNOSIS — D631 Anemia in chronic kidney disease: Secondary | ICD-10-CM | POA: Diagnosis not present

## 2019-08-10 DIAGNOSIS — Z992 Dependence on renal dialysis: Secondary | ICD-10-CM | POA: Diagnosis not present

## 2019-08-10 DIAGNOSIS — D509 Iron deficiency anemia, unspecified: Secondary | ICD-10-CM | POA: Diagnosis not present

## 2019-08-10 DIAGNOSIS — N186 End stage renal disease: Secondary | ICD-10-CM | POA: Diagnosis not present

## 2019-08-13 DIAGNOSIS — N2581 Secondary hyperparathyroidism of renal origin: Secondary | ICD-10-CM | POA: Diagnosis not present

## 2019-08-13 DIAGNOSIS — D631 Anemia in chronic kidney disease: Secondary | ICD-10-CM | POA: Diagnosis not present

## 2019-08-13 DIAGNOSIS — N186 End stage renal disease: Secondary | ICD-10-CM | POA: Diagnosis not present

## 2019-08-13 DIAGNOSIS — Z992 Dependence on renal dialysis: Secondary | ICD-10-CM | POA: Diagnosis not present

## 2019-08-13 DIAGNOSIS — D509 Iron deficiency anemia, unspecified: Secondary | ICD-10-CM | POA: Diagnosis not present

## 2019-08-15 ENCOUNTER — Other Ambulatory Visit: Payer: Self-pay | Admitting: *Deleted

## 2019-08-15 DIAGNOSIS — Z992 Dependence on renal dialysis: Secondary | ICD-10-CM | POA: Diagnosis not present

## 2019-08-15 DIAGNOSIS — N186 End stage renal disease: Secondary | ICD-10-CM | POA: Diagnosis not present

## 2019-08-15 DIAGNOSIS — N2581 Secondary hyperparathyroidism of renal origin: Secondary | ICD-10-CM | POA: Diagnosis not present

## 2019-08-15 DIAGNOSIS — D631 Anemia in chronic kidney disease: Secondary | ICD-10-CM | POA: Diagnosis not present

## 2019-08-15 DIAGNOSIS — D509 Iron deficiency anemia, unspecified: Secondary | ICD-10-CM | POA: Diagnosis not present

## 2019-08-15 MED ORDER — CITALOPRAM HYDROBROMIDE 40 MG PO TABS: 40.0000 mg | ORAL_TABLET | Freq: Every day | ORAL | 0 refills | Status: DC

## 2019-08-17 DIAGNOSIS — D631 Anemia in chronic kidney disease: Secondary | ICD-10-CM | POA: Diagnosis not present

## 2019-08-17 DIAGNOSIS — N2581 Secondary hyperparathyroidism of renal origin: Secondary | ICD-10-CM | POA: Diagnosis not present

## 2019-08-17 DIAGNOSIS — Z992 Dependence on renal dialysis: Secondary | ICD-10-CM | POA: Diagnosis not present

## 2019-08-17 DIAGNOSIS — N186 End stage renal disease: Secondary | ICD-10-CM | POA: Diagnosis not present

## 2019-08-17 DIAGNOSIS — D509 Iron deficiency anemia, unspecified: Secondary | ICD-10-CM | POA: Diagnosis not present

## 2019-08-20 DIAGNOSIS — D631 Anemia in chronic kidney disease: Secondary | ICD-10-CM | POA: Diagnosis not present

## 2019-08-20 DIAGNOSIS — N2581 Secondary hyperparathyroidism of renal origin: Secondary | ICD-10-CM | POA: Diagnosis not present

## 2019-08-20 DIAGNOSIS — Z992 Dependence on renal dialysis: Secondary | ICD-10-CM | POA: Diagnosis not present

## 2019-08-20 DIAGNOSIS — N186 End stage renal disease: Secondary | ICD-10-CM | POA: Diagnosis not present

## 2019-08-20 DIAGNOSIS — D509 Iron deficiency anemia, unspecified: Secondary | ICD-10-CM | POA: Diagnosis not present

## 2019-08-22 DIAGNOSIS — D509 Iron deficiency anemia, unspecified: Secondary | ICD-10-CM | POA: Diagnosis not present

## 2019-08-22 DIAGNOSIS — Z992 Dependence on renal dialysis: Secondary | ICD-10-CM | POA: Diagnosis not present

## 2019-08-22 DIAGNOSIS — N2581 Secondary hyperparathyroidism of renal origin: Secondary | ICD-10-CM | POA: Diagnosis not present

## 2019-08-22 DIAGNOSIS — N186 End stage renal disease: Secondary | ICD-10-CM | POA: Diagnosis not present

## 2019-08-22 DIAGNOSIS — D631 Anemia in chronic kidney disease: Secondary | ICD-10-CM | POA: Diagnosis not present

## 2019-08-24 DIAGNOSIS — D509 Iron deficiency anemia, unspecified: Secondary | ICD-10-CM | POA: Diagnosis not present

## 2019-08-24 DIAGNOSIS — N186 End stage renal disease: Secondary | ICD-10-CM | POA: Diagnosis not present

## 2019-08-24 DIAGNOSIS — N2581 Secondary hyperparathyroidism of renal origin: Secondary | ICD-10-CM | POA: Diagnosis not present

## 2019-08-24 DIAGNOSIS — Z992 Dependence on renal dialysis: Secondary | ICD-10-CM | POA: Diagnosis not present

## 2019-08-24 DIAGNOSIS — D631 Anemia in chronic kidney disease: Secondary | ICD-10-CM | POA: Diagnosis not present

## 2019-08-27 DIAGNOSIS — Z992 Dependence on renal dialysis: Secondary | ICD-10-CM | POA: Diagnosis not present

## 2019-08-27 DIAGNOSIS — N186 End stage renal disease: Secondary | ICD-10-CM | POA: Diagnosis not present

## 2019-08-27 DIAGNOSIS — D631 Anemia in chronic kidney disease: Secondary | ICD-10-CM | POA: Diagnosis not present

## 2019-08-27 DIAGNOSIS — N2581 Secondary hyperparathyroidism of renal origin: Secondary | ICD-10-CM | POA: Diagnosis not present

## 2019-08-27 DIAGNOSIS — D509 Iron deficiency anemia, unspecified: Secondary | ICD-10-CM | POA: Diagnosis not present

## 2019-08-29 DIAGNOSIS — D631 Anemia in chronic kidney disease: Secondary | ICD-10-CM | POA: Diagnosis not present

## 2019-08-29 DIAGNOSIS — N186 End stage renal disease: Secondary | ICD-10-CM | POA: Diagnosis not present

## 2019-08-29 DIAGNOSIS — N2581 Secondary hyperparathyroidism of renal origin: Secondary | ICD-10-CM | POA: Diagnosis not present

## 2019-08-29 DIAGNOSIS — D509 Iron deficiency anemia, unspecified: Secondary | ICD-10-CM | POA: Diagnosis not present

## 2019-08-29 DIAGNOSIS — Z992 Dependence on renal dialysis: Secondary | ICD-10-CM | POA: Diagnosis not present

## 2019-08-30 ENCOUNTER — Encounter: Payer: Self-pay | Admitting: Family Medicine

## 2019-08-30 ENCOUNTER — Ambulatory Visit (INDEPENDENT_AMBULATORY_CARE_PROVIDER_SITE_OTHER): Payer: Medicare Other | Admitting: Family Medicine

## 2019-08-30 ENCOUNTER — Other Ambulatory Visit: Payer: Self-pay

## 2019-08-30 VITALS — BP 120/74 | HR 74 | Temp 97.2°F | Ht 67.75 in | Wt 179.4 lb

## 2019-08-30 DIAGNOSIS — E1121 Type 2 diabetes mellitus with diabetic nephropathy: Secondary | ICD-10-CM | POA: Diagnosis not present

## 2019-08-30 DIAGNOSIS — R011 Cardiac murmur, unspecified: Secondary | ICD-10-CM

## 2019-08-30 DIAGNOSIS — I10 Essential (primary) hypertension: Secondary | ICD-10-CM | POA: Diagnosis not present

## 2019-08-30 DIAGNOSIS — D631 Anemia in chronic kidney disease: Secondary | ICD-10-CM

## 2019-08-30 DIAGNOSIS — N184 Chronic kidney disease, stage 4 (severe): Secondary | ICD-10-CM | POA: Diagnosis not present

## 2019-08-30 DIAGNOSIS — F3341 Major depressive disorder, recurrent, in partial remission: Secondary | ICD-10-CM | POA: Diagnosis not present

## 2019-08-30 DIAGNOSIS — E785 Hyperlipidemia, unspecified: Secondary | ICD-10-CM | POA: Diagnosis not present

## 2019-08-30 MED ORDER — CITALOPRAM HYDROBROMIDE 40 MG PO TABS: 40.0000 mg | ORAL_TABLET | Freq: Every day | ORAL | 1 refills | Status: DC
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
## 2019-08-31 DIAGNOSIS — N2581 Secondary hyperparathyroidism of renal origin: Secondary | ICD-10-CM | POA: Diagnosis not present

## 2019-08-31 DIAGNOSIS — N186 End stage renal disease: Secondary | ICD-10-CM | POA: Diagnosis not present

## 2019-08-31 DIAGNOSIS — D631 Anemia in chronic kidney disease: Secondary | ICD-10-CM | POA: Diagnosis not present

## 2019-08-31 DIAGNOSIS — Z992 Dependence on renal dialysis: Secondary | ICD-10-CM | POA: Diagnosis not present

## 2019-08-31 DIAGNOSIS — D509 Iron deficiency anemia, unspecified: Secondary | ICD-10-CM | POA: Diagnosis not present

## 2019-09-03 ENCOUNTER — Other Ambulatory Visit (HOSPITAL_COMMUNITY): Payer: Managed Care, Other (non HMO)

## 2019-09-03 DIAGNOSIS — N186 End stage renal disease: Secondary | ICD-10-CM | POA: Diagnosis not present

## 2019-09-03 DIAGNOSIS — D509 Iron deficiency anemia, unspecified: Secondary | ICD-10-CM | POA: Diagnosis not present

## 2019-09-03 DIAGNOSIS — D631 Anemia in chronic kidney disease: Secondary | ICD-10-CM | POA: Diagnosis not present

## 2019-09-03 DIAGNOSIS — N2581 Secondary hyperparathyroidism of renal origin: Secondary | ICD-10-CM | POA: Diagnosis not present

## 2019-09-03 DIAGNOSIS — Z992 Dependence on renal dialysis: Secondary | ICD-10-CM | POA: Diagnosis not present

## 2019-09-04 ENCOUNTER — Ambulatory Visit (HOSPITAL_COMMUNITY)
Admission: RE | Admit: 2019-09-04 | Discharge: 2019-09-04 | Disposition: A | Discharge status: Home / Self Care | Payer: Medicare Other | Source: Ambulatory Visit | Attending: Family Medicine | Admitting: Family Medicine

## 2019-09-04 DIAGNOSIS — R011 Cardiac murmur, unspecified: Secondary | ICD-10-CM | POA: Diagnosis not present

## 2019-09-04 DIAGNOSIS — I350 Nonrheumatic aortic (valve) stenosis: Secondary | ICD-10-CM | POA: Diagnosis not present

## 2019-09-04 DIAGNOSIS — Z992 Dependence on renal dialysis: Secondary | ICD-10-CM | POA: Diagnosis not present

## 2019-09-04 DIAGNOSIS — N186 End stage renal disease: Secondary | ICD-10-CM | POA: Diagnosis not present

## 2019-09-04 LAB — ECHOCARDIOGRAM COMPLETE
AR max vel: 1.31 cm2
AV Area VTI: 1.39 cm2
AV Area mean vel: 1.35 cm2
AV Mean grad: 12.3 mmHg
AV Peak grad: 21.8 mmHg
Ao pk vel: 2.33 m/s
Area-P 1/2: 2.88 cm2
S' Lateral: 2.61 cm

## 2019-09-04 NOTE — Progress Notes (Signed)
*  PRELIMINARY RESULTS* Echocardiogram 2D Echocardiogram has been performed.  Paul Gonzalez 09/04/2019, 9:28 AM

## 2019-09-05 DIAGNOSIS — D631 Anemia in chronic kidney disease: Secondary | ICD-10-CM | POA: Diagnosis not present

## 2019-09-05 DIAGNOSIS — N186 End stage renal disease: Secondary | ICD-10-CM | POA: Diagnosis not present

## 2019-09-05 DIAGNOSIS — N2581 Secondary hyperparathyroidism of renal origin: Secondary | ICD-10-CM | POA: Diagnosis not present

## 2019-09-05 DIAGNOSIS — Z992 Dependence on renal dialysis: Secondary | ICD-10-CM | POA: Diagnosis not present

## 2019-09-05 DIAGNOSIS — D509 Iron deficiency anemia, unspecified: Secondary | ICD-10-CM | POA: Diagnosis not present

## 2019-09-07 DIAGNOSIS — Z992 Dependence on renal dialysis: Secondary | ICD-10-CM | POA: Diagnosis not present

## 2019-09-07 DIAGNOSIS — N2581 Secondary hyperparathyroidism of renal origin: Secondary | ICD-10-CM | POA: Diagnosis not present

## 2019-09-07 DIAGNOSIS — D509 Iron deficiency anemia, unspecified: Secondary | ICD-10-CM | POA: Diagnosis not present

## 2019-09-07 DIAGNOSIS — N186 End stage renal disease: Secondary | ICD-10-CM | POA: Diagnosis not present

## 2019-09-07 DIAGNOSIS — D631 Anemia in chronic kidney disease: Secondary | ICD-10-CM | POA: Diagnosis not present

## 2019-09-10 DIAGNOSIS — Z992 Dependence on renal dialysis: Secondary | ICD-10-CM | POA: Diagnosis not present

## 2019-09-10 DIAGNOSIS — D509 Iron deficiency anemia, unspecified: Secondary | ICD-10-CM | POA: Diagnosis not present

## 2019-09-10 DIAGNOSIS — N186 End stage renal disease: Secondary | ICD-10-CM | POA: Diagnosis not present

## 2019-09-10 DIAGNOSIS — D631 Anemia in chronic kidney disease: Secondary | ICD-10-CM | POA: Diagnosis not present

## 2019-09-10 DIAGNOSIS — N2581 Secondary hyperparathyroidism of renal origin: Secondary | ICD-10-CM | POA: Diagnosis not present

## 2019-09-11 ENCOUNTER — Other Ambulatory Visit: Payer: Self-pay | Admitting: Family Medicine

## 2019-09-12 DIAGNOSIS — D509 Iron deficiency anemia, unspecified: Secondary | ICD-10-CM | POA: Diagnosis not present

## 2019-09-12 DIAGNOSIS — Z992 Dependence on renal dialysis: Secondary | ICD-10-CM | POA: Diagnosis not present

## 2019-09-12 DIAGNOSIS — N2581 Secondary hyperparathyroidism of renal origin: Secondary | ICD-10-CM | POA: Diagnosis not present

## 2019-09-12 DIAGNOSIS — D631 Anemia in chronic kidney disease: Secondary | ICD-10-CM | POA: Diagnosis not present

## 2019-09-12 DIAGNOSIS — N186 End stage renal disease: Secondary | ICD-10-CM | POA: Diagnosis not present

## 2019-09-14 DIAGNOSIS — N186 End stage renal disease: Secondary | ICD-10-CM | POA: Diagnosis not present

## 2019-09-14 DIAGNOSIS — D631 Anemia in chronic kidney disease: Secondary | ICD-10-CM | POA: Diagnosis not present

## 2019-09-14 DIAGNOSIS — N2581 Secondary hyperparathyroidism of renal origin: Secondary | ICD-10-CM | POA: Diagnosis not present

## 2019-09-14 DIAGNOSIS — Z992 Dependence on renal dialysis: Secondary | ICD-10-CM | POA: Diagnosis not present

## 2019-09-14 DIAGNOSIS — D509 Iron deficiency anemia, unspecified: Secondary | ICD-10-CM | POA: Diagnosis not present

## 2019-09-17 DIAGNOSIS — Z992 Dependence on renal dialysis: Secondary | ICD-10-CM | POA: Diagnosis not present

## 2019-09-17 DIAGNOSIS — N2581 Secondary hyperparathyroidism of renal origin: Secondary | ICD-10-CM | POA: Diagnosis not present

## 2019-09-17 DIAGNOSIS — N186 End stage renal disease: Secondary | ICD-10-CM | POA: Diagnosis not present

## 2019-09-17 DIAGNOSIS — D509 Iron deficiency anemia, unspecified: Secondary | ICD-10-CM | POA: Diagnosis not present

## 2019-09-17 DIAGNOSIS — D631 Anemia in chronic kidney disease: Secondary | ICD-10-CM | POA: Diagnosis not present

## 2019-09-18 ENCOUNTER — Other Ambulatory Visit: Payer: Self-pay | Admitting: Family Medicine

## 2019-09-19 DIAGNOSIS — N2581 Secondary hyperparathyroidism of renal origin: Secondary | ICD-10-CM | POA: Diagnosis not present

## 2019-09-19 DIAGNOSIS — N186 End stage renal disease: Secondary | ICD-10-CM | POA: Diagnosis not present

## 2019-09-19 DIAGNOSIS — D631 Anemia in chronic kidney disease: Secondary | ICD-10-CM | POA: Diagnosis not present

## 2019-09-19 DIAGNOSIS — Z992 Dependence on renal dialysis: Secondary | ICD-10-CM | POA: Diagnosis not present

## 2019-09-19 DIAGNOSIS — D509 Iron deficiency anemia, unspecified: Secondary | ICD-10-CM | POA: Diagnosis not present

## 2019-09-21 DIAGNOSIS — D509 Iron deficiency anemia, unspecified: Secondary | ICD-10-CM | POA: Diagnosis not present

## 2019-09-21 DIAGNOSIS — N2581 Secondary hyperparathyroidism of renal origin: Secondary | ICD-10-CM | POA: Diagnosis not present

## 2019-09-21 DIAGNOSIS — N186 End stage renal disease: Secondary | ICD-10-CM | POA: Diagnosis not present

## 2019-09-21 DIAGNOSIS — D631 Anemia in chronic kidney disease: Secondary | ICD-10-CM | POA: Diagnosis not present

## 2019-09-21 DIAGNOSIS — Z992 Dependence on renal dialysis: Secondary | ICD-10-CM | POA: Diagnosis not present

## 2019-09-24 DIAGNOSIS — D631 Anemia in chronic kidney disease: Secondary | ICD-10-CM | POA: Diagnosis not present

## 2019-09-24 DIAGNOSIS — N2581 Secondary hyperparathyroidism of renal origin: Secondary | ICD-10-CM | POA: Diagnosis not present

## 2019-09-24 DIAGNOSIS — N186 End stage renal disease: Secondary | ICD-10-CM | POA: Diagnosis not present

## 2019-09-24 DIAGNOSIS — Z992 Dependence on renal dialysis: Secondary | ICD-10-CM | POA: Diagnosis not present

## 2019-09-24 DIAGNOSIS — D509 Iron deficiency anemia, unspecified: Secondary | ICD-10-CM | POA: Diagnosis not present

## 2019-09-26 DIAGNOSIS — Z992 Dependence on renal dialysis: Secondary | ICD-10-CM | POA: Diagnosis not present

## 2019-09-26 DIAGNOSIS — D631 Anemia in chronic kidney disease: Secondary | ICD-10-CM | POA: Diagnosis not present

## 2019-09-26 DIAGNOSIS — D509 Iron deficiency anemia, unspecified: Secondary | ICD-10-CM | POA: Diagnosis not present

## 2019-09-26 DIAGNOSIS — N186 End stage renal disease: Secondary | ICD-10-CM | POA: Diagnosis not present

## 2019-09-26 DIAGNOSIS — N2581 Secondary hyperparathyroidism of renal origin: Secondary | ICD-10-CM | POA: Diagnosis not present

## 2019-09-28 DIAGNOSIS — N186 End stage renal disease: Secondary | ICD-10-CM | POA: Diagnosis not present

## 2019-09-28 DIAGNOSIS — N2581 Secondary hyperparathyroidism of renal origin: Secondary | ICD-10-CM | POA: Diagnosis not present

## 2019-09-28 DIAGNOSIS — Z992 Dependence on renal dialysis: Secondary | ICD-10-CM | POA: Diagnosis not present

## 2019-09-28 DIAGNOSIS — D509 Iron deficiency anemia, unspecified: Secondary | ICD-10-CM | POA: Diagnosis not present

## 2019-09-28 DIAGNOSIS — D631 Anemia in chronic kidney disease: Secondary | ICD-10-CM | POA: Diagnosis not present

## 2019-10-01 DIAGNOSIS — N186 End stage renal disease: Secondary | ICD-10-CM | POA: Diagnosis not present

## 2019-10-01 DIAGNOSIS — Z992 Dependence on renal dialysis: Secondary | ICD-10-CM | POA: Diagnosis not present

## 2019-10-01 DIAGNOSIS — N2581 Secondary hyperparathyroidism of renal origin: Secondary | ICD-10-CM | POA: Diagnosis not present

## 2019-10-01 DIAGNOSIS — D509 Iron deficiency anemia, unspecified: Secondary | ICD-10-CM | POA: Diagnosis not present

## 2019-10-01 DIAGNOSIS — D631 Anemia in chronic kidney disease: Secondary | ICD-10-CM | POA: Diagnosis not present

## 2019-10-03 DIAGNOSIS — N186 End stage renal disease: Secondary | ICD-10-CM | POA: Diagnosis not present

## 2019-10-03 DIAGNOSIS — N2581 Secondary hyperparathyroidism of renal origin: Secondary | ICD-10-CM | POA: Diagnosis not present

## 2019-10-03 DIAGNOSIS — D631 Anemia in chronic kidney disease: Secondary | ICD-10-CM | POA: Diagnosis not present

## 2019-10-03 DIAGNOSIS — Z992 Dependence on renal dialysis: Secondary | ICD-10-CM | POA: Diagnosis not present

## 2019-10-03 DIAGNOSIS — D509 Iron deficiency anemia, unspecified: Secondary | ICD-10-CM | POA: Diagnosis not present

## 2019-10-04 DIAGNOSIS — N186 End stage renal disease: Secondary | ICD-10-CM | POA: Diagnosis not present

## 2019-10-04 DIAGNOSIS — Z992 Dependence on renal dialysis: Secondary | ICD-10-CM | POA: Diagnosis not present

## 2019-10-05 DIAGNOSIS — Z992 Dependence on renal dialysis: Secondary | ICD-10-CM | POA: Diagnosis not present

## 2019-10-05 DIAGNOSIS — Z23 Encounter for immunization: Secondary | ICD-10-CM | POA: Diagnosis not present

## 2019-10-05 DIAGNOSIS — N186 End stage renal disease: Secondary | ICD-10-CM | POA: Diagnosis not present

## 2019-10-05 DIAGNOSIS — D631 Anemia in chronic kidney disease: Secondary | ICD-10-CM | POA: Diagnosis not present

## 2019-10-05 DIAGNOSIS — N2581 Secondary hyperparathyroidism of renal origin: Secondary | ICD-10-CM | POA: Diagnosis not present

## 2019-10-05 DIAGNOSIS — D509 Iron deficiency anemia, unspecified: Secondary | ICD-10-CM | POA: Diagnosis not present

## 2019-10-08 DIAGNOSIS — D509 Iron deficiency anemia, unspecified: Secondary | ICD-10-CM | POA: Diagnosis not present

## 2019-10-08 DIAGNOSIS — Z992 Dependence on renal dialysis: Secondary | ICD-10-CM | POA: Diagnosis not present

## 2019-10-08 DIAGNOSIS — N2581 Secondary hyperparathyroidism of renal origin: Secondary | ICD-10-CM | POA: Diagnosis not present

## 2019-10-08 DIAGNOSIS — N186 End stage renal disease: Secondary | ICD-10-CM | POA: Diagnosis not present

## 2019-10-08 DIAGNOSIS — D631 Anemia in chronic kidney disease: Secondary | ICD-10-CM | POA: Diagnosis not present

## 2019-10-08 DIAGNOSIS — Z23 Encounter for immunization: Secondary | ICD-10-CM | POA: Diagnosis not present

## 2019-10-10 DIAGNOSIS — N186 End stage renal disease: Secondary | ICD-10-CM | POA: Diagnosis not present

## 2019-10-10 DIAGNOSIS — N2581 Secondary hyperparathyroidism of renal origin: Secondary | ICD-10-CM | POA: Diagnosis not present

## 2019-10-10 DIAGNOSIS — D631 Anemia in chronic kidney disease: Secondary | ICD-10-CM | POA: Diagnosis not present

## 2019-10-10 DIAGNOSIS — D509 Iron deficiency anemia, unspecified: Secondary | ICD-10-CM | POA: Diagnosis not present

## 2019-10-10 DIAGNOSIS — Z992 Dependence on renal dialysis: Secondary | ICD-10-CM | POA: Diagnosis not present

## 2019-10-10 DIAGNOSIS — Z23 Encounter for immunization: Secondary | ICD-10-CM | POA: Diagnosis not present

## 2019-10-12 DIAGNOSIS — Z992 Dependence on renal dialysis: Secondary | ICD-10-CM | POA: Diagnosis not present

## 2019-10-12 DIAGNOSIS — Z23 Encounter for immunization: Secondary | ICD-10-CM | POA: Diagnosis not present

## 2019-10-12 DIAGNOSIS — N2581 Secondary hyperparathyroidism of renal origin: Secondary | ICD-10-CM | POA: Diagnosis not present

## 2019-10-12 DIAGNOSIS — D509 Iron deficiency anemia, unspecified: Secondary | ICD-10-CM | POA: Diagnosis not present

## 2019-10-12 DIAGNOSIS — N186 End stage renal disease: Secondary | ICD-10-CM | POA: Diagnosis not present

## 2019-10-12 DIAGNOSIS — D631 Anemia in chronic kidney disease: Secondary | ICD-10-CM | POA: Diagnosis not present

## 2019-10-15 DIAGNOSIS — N186 End stage renal disease: Secondary | ICD-10-CM | POA: Diagnosis not present

## 2019-10-15 DIAGNOSIS — D509 Iron deficiency anemia, unspecified: Secondary | ICD-10-CM | POA: Diagnosis not present

## 2019-10-15 DIAGNOSIS — E119 Type 2 diabetes mellitus without complications: Secondary | ICD-10-CM | POA: Diagnosis not present

## 2019-10-15 DIAGNOSIS — N2581 Secondary hyperparathyroidism of renal origin: Secondary | ICD-10-CM | POA: Diagnosis not present

## 2019-10-15 DIAGNOSIS — Z23 Encounter for immunization: Secondary | ICD-10-CM | POA: Diagnosis not present

## 2019-10-15 DIAGNOSIS — Z992 Dependence on renal dialysis: Secondary | ICD-10-CM | POA: Diagnosis not present

## 2019-10-15 DIAGNOSIS — D631 Anemia in chronic kidney disease: Secondary | ICD-10-CM | POA: Diagnosis not present

## 2019-10-17 DIAGNOSIS — N186 End stage renal disease: Secondary | ICD-10-CM | POA: Diagnosis not present

## 2019-10-17 DIAGNOSIS — Z992 Dependence on renal dialysis: Secondary | ICD-10-CM | POA: Diagnosis not present

## 2019-10-17 DIAGNOSIS — D509 Iron deficiency anemia, unspecified: Secondary | ICD-10-CM | POA: Diagnosis not present

## 2019-10-17 DIAGNOSIS — D631 Anemia in chronic kidney disease: Secondary | ICD-10-CM | POA: Diagnosis not present

## 2019-10-17 DIAGNOSIS — N2581 Secondary hyperparathyroidism of renal origin: Secondary | ICD-10-CM | POA: Diagnosis not present

## 2019-10-17 DIAGNOSIS — Z23 Encounter for immunization: Secondary | ICD-10-CM | POA: Diagnosis not present

## 2019-10-19 DIAGNOSIS — Z992 Dependence on renal dialysis: Secondary | ICD-10-CM | POA: Diagnosis not present

## 2019-10-19 DIAGNOSIS — D509 Iron deficiency anemia, unspecified: Secondary | ICD-10-CM | POA: Diagnosis not present

## 2019-10-19 DIAGNOSIS — N186 End stage renal disease: Secondary | ICD-10-CM | POA: Diagnosis not present

## 2019-10-19 DIAGNOSIS — N2581 Secondary hyperparathyroidism of renal origin: Secondary | ICD-10-CM | POA: Diagnosis not present

## 2019-10-19 DIAGNOSIS — Z23 Encounter for immunization: Secondary | ICD-10-CM | POA: Diagnosis not present

## 2019-10-19 DIAGNOSIS — D631 Anemia in chronic kidney disease: Secondary | ICD-10-CM | POA: Diagnosis not present

## 2019-10-22 DIAGNOSIS — Z23 Encounter for immunization: Secondary | ICD-10-CM | POA: Diagnosis not present

## 2019-10-22 DIAGNOSIS — N186 End stage renal disease: Secondary | ICD-10-CM | POA: Diagnosis not present

## 2019-10-22 DIAGNOSIS — N2581 Secondary hyperparathyroidism of renal origin: Secondary | ICD-10-CM | POA: Diagnosis not present

## 2019-10-22 DIAGNOSIS — D509 Iron deficiency anemia, unspecified: Secondary | ICD-10-CM | POA: Diagnosis not present

## 2019-10-22 DIAGNOSIS — Z992 Dependence on renal dialysis: Secondary | ICD-10-CM | POA: Diagnosis not present

## 2019-10-22 DIAGNOSIS — D631 Anemia in chronic kidney disease: Secondary | ICD-10-CM | POA: Diagnosis not present

## 2019-10-24 DIAGNOSIS — Z992 Dependence on renal dialysis: Secondary | ICD-10-CM | POA: Diagnosis not present

## 2019-10-24 DIAGNOSIS — Z23 Encounter for immunization: Secondary | ICD-10-CM | POA: Diagnosis not present

## 2019-10-24 DIAGNOSIS — N186 End stage renal disease: Secondary | ICD-10-CM | POA: Diagnosis not present

## 2019-10-24 DIAGNOSIS — D631 Anemia in chronic kidney disease: Secondary | ICD-10-CM | POA: Diagnosis not present

## 2019-10-24 DIAGNOSIS — D509 Iron deficiency anemia, unspecified: Secondary | ICD-10-CM | POA: Diagnosis not present

## 2019-10-24 DIAGNOSIS — N2581 Secondary hyperparathyroidism of renal origin: Secondary | ICD-10-CM | POA: Diagnosis not present

## 2019-10-26 DIAGNOSIS — D631 Anemia in chronic kidney disease: Secondary | ICD-10-CM | POA: Diagnosis not present

## 2019-10-26 DIAGNOSIS — Z992 Dependence on renal dialysis: Secondary | ICD-10-CM | POA: Diagnosis not present

## 2019-10-26 DIAGNOSIS — N2581 Secondary hyperparathyroidism of renal origin: Secondary | ICD-10-CM | POA: Diagnosis not present

## 2019-10-26 DIAGNOSIS — D509 Iron deficiency anemia, unspecified: Secondary | ICD-10-CM | POA: Diagnosis not present

## 2019-10-26 DIAGNOSIS — Z23 Encounter for immunization: Secondary | ICD-10-CM | POA: Diagnosis not present

## 2019-10-26 DIAGNOSIS — N186 End stage renal disease: Secondary | ICD-10-CM | POA: Diagnosis not present

## 2019-10-29 DIAGNOSIS — Z992 Dependence on renal dialysis: Secondary | ICD-10-CM | POA: Diagnosis not present

## 2019-10-29 DIAGNOSIS — N2581 Secondary hyperparathyroidism of renal origin: Secondary | ICD-10-CM | POA: Diagnosis not present

## 2019-10-29 DIAGNOSIS — D631 Anemia in chronic kidney disease: Secondary | ICD-10-CM | POA: Diagnosis not present

## 2019-10-29 DIAGNOSIS — Z23 Encounter for immunization: Secondary | ICD-10-CM | POA: Diagnosis not present

## 2019-10-29 DIAGNOSIS — D509 Iron deficiency anemia, unspecified: Secondary | ICD-10-CM | POA: Diagnosis not present

## 2019-10-29 DIAGNOSIS — N186 End stage renal disease: Secondary | ICD-10-CM | POA: Diagnosis not present

## 2019-10-31 DIAGNOSIS — N2581 Secondary hyperparathyroidism of renal origin: Secondary | ICD-10-CM | POA: Diagnosis not present

## 2019-10-31 DIAGNOSIS — D631 Anemia in chronic kidney disease: Secondary | ICD-10-CM | POA: Diagnosis not present

## 2019-10-31 DIAGNOSIS — Z23 Encounter for immunization: Secondary | ICD-10-CM | POA: Diagnosis not present

## 2019-10-31 DIAGNOSIS — N186 End stage renal disease: Secondary | ICD-10-CM | POA: Diagnosis not present

## 2019-10-31 DIAGNOSIS — Z992 Dependence on renal dialysis: Secondary | ICD-10-CM | POA: Diagnosis not present

## 2019-10-31 DIAGNOSIS — D509 Iron deficiency anemia, unspecified: Secondary | ICD-10-CM | POA: Diagnosis not present

## 2019-11-02 DIAGNOSIS — Z23 Encounter for immunization: Secondary | ICD-10-CM | POA: Diagnosis not present

## 2019-11-02 DIAGNOSIS — D631 Anemia in chronic kidney disease: Secondary | ICD-10-CM | POA: Diagnosis not present

## 2019-11-02 DIAGNOSIS — D509 Iron deficiency anemia, unspecified: Secondary | ICD-10-CM | POA: Diagnosis not present

## 2019-11-02 DIAGNOSIS — Z992 Dependence on renal dialysis: Secondary | ICD-10-CM | POA: Diagnosis not present

## 2019-11-02 DIAGNOSIS — N2581 Secondary hyperparathyroidism of renal origin: Secondary | ICD-10-CM | POA: Diagnosis not present

## 2019-11-02 DIAGNOSIS — N186 End stage renal disease: Secondary | ICD-10-CM | POA: Diagnosis not present

## 2019-11-04 DIAGNOSIS — N186 End stage renal disease: Secondary | ICD-10-CM | POA: Diagnosis not present

## 2019-11-04 DIAGNOSIS — Z992 Dependence on renal dialysis: Secondary | ICD-10-CM | POA: Diagnosis not present

## 2019-11-05 DIAGNOSIS — N186 End stage renal disease: Secondary | ICD-10-CM | POA: Diagnosis not present

## 2019-11-05 DIAGNOSIS — D509 Iron deficiency anemia, unspecified: Secondary | ICD-10-CM | POA: Diagnosis not present

## 2019-11-05 DIAGNOSIS — N2581 Secondary hyperparathyroidism of renal origin: Secondary | ICD-10-CM | POA: Diagnosis not present

## 2019-11-05 DIAGNOSIS — Z992 Dependence on renal dialysis: Secondary | ICD-10-CM | POA: Diagnosis not present

## 2019-11-05 DIAGNOSIS — D631 Anemia in chronic kidney disease: Secondary | ICD-10-CM | POA: Diagnosis not present

## 2019-11-07 DIAGNOSIS — N2581 Secondary hyperparathyroidism of renal origin: Secondary | ICD-10-CM | POA: Diagnosis not present

## 2019-11-07 DIAGNOSIS — Z992 Dependence on renal dialysis: Secondary | ICD-10-CM | POA: Diagnosis not present

## 2019-11-07 DIAGNOSIS — N186 End stage renal disease: Secondary | ICD-10-CM | POA: Diagnosis not present

## 2019-11-07 DIAGNOSIS — D631 Anemia in chronic kidney disease: Secondary | ICD-10-CM | POA: Diagnosis not present

## 2019-11-07 DIAGNOSIS — D509 Iron deficiency anemia, unspecified: Secondary | ICD-10-CM | POA: Diagnosis not present

## 2019-11-09 DIAGNOSIS — N2581 Secondary hyperparathyroidism of renal origin: Secondary | ICD-10-CM | POA: Diagnosis not present

## 2019-11-09 DIAGNOSIS — D631 Anemia in chronic kidney disease: Secondary | ICD-10-CM | POA: Diagnosis not present

## 2019-11-09 DIAGNOSIS — N186 End stage renal disease: Secondary | ICD-10-CM | POA: Diagnosis not present

## 2019-11-09 DIAGNOSIS — D509 Iron deficiency anemia, unspecified: Secondary | ICD-10-CM | POA: Diagnosis not present

## 2019-11-09 DIAGNOSIS — Z992 Dependence on renal dialysis: Secondary | ICD-10-CM | POA: Diagnosis not present

## 2019-11-12 DIAGNOSIS — D631 Anemia in chronic kidney disease: Secondary | ICD-10-CM | POA: Diagnosis not present

## 2019-11-12 DIAGNOSIS — D509 Iron deficiency anemia, unspecified: Secondary | ICD-10-CM | POA: Diagnosis not present

## 2019-11-12 DIAGNOSIS — N186 End stage renal disease: Secondary | ICD-10-CM | POA: Diagnosis not present

## 2019-11-12 DIAGNOSIS — N2581 Secondary hyperparathyroidism of renal origin: Secondary | ICD-10-CM | POA: Diagnosis not present

## 2019-11-12 DIAGNOSIS — Z992 Dependence on renal dialysis: Secondary | ICD-10-CM | POA: Diagnosis not present

## 2019-11-14 DIAGNOSIS — Z992 Dependence on renal dialysis: Secondary | ICD-10-CM | POA: Diagnosis not present

## 2019-11-14 DIAGNOSIS — N186 End stage renal disease: Secondary | ICD-10-CM | POA: Diagnosis not present

## 2019-11-14 DIAGNOSIS — D631 Anemia in chronic kidney disease: Secondary | ICD-10-CM | POA: Diagnosis not present

## 2019-11-14 DIAGNOSIS — D509 Iron deficiency anemia, unspecified: Secondary | ICD-10-CM | POA: Diagnosis not present

## 2019-11-14 DIAGNOSIS — N2581 Secondary hyperparathyroidism of renal origin: Secondary | ICD-10-CM | POA: Diagnosis not present

## 2019-11-16 DIAGNOSIS — Z992 Dependence on renal dialysis: Secondary | ICD-10-CM | POA: Diagnosis not present

## 2019-11-16 DIAGNOSIS — N2581 Secondary hyperparathyroidism of renal origin: Secondary | ICD-10-CM | POA: Diagnosis not present

## 2019-11-16 DIAGNOSIS — D509 Iron deficiency anemia, unspecified: Secondary | ICD-10-CM | POA: Diagnosis not present

## 2019-11-16 DIAGNOSIS — N186 End stage renal disease: Secondary | ICD-10-CM | POA: Diagnosis not present

## 2019-11-16 DIAGNOSIS — D631 Anemia in chronic kidney disease: Secondary | ICD-10-CM | POA: Diagnosis not present

## 2019-11-19 DIAGNOSIS — N186 End stage renal disease: Secondary | ICD-10-CM | POA: Diagnosis not present

## 2019-11-19 DIAGNOSIS — Z992 Dependence on renal dialysis: Secondary | ICD-10-CM | POA: Diagnosis not present

## 2019-11-19 DIAGNOSIS — N2581 Secondary hyperparathyroidism of renal origin: Secondary | ICD-10-CM | POA: Diagnosis not present

## 2019-11-19 DIAGNOSIS — D509 Iron deficiency anemia, unspecified: Secondary | ICD-10-CM | POA: Diagnosis not present

## 2019-11-19 DIAGNOSIS — D631 Anemia in chronic kidney disease: Secondary | ICD-10-CM | POA: Diagnosis not present

## 2019-11-21 DIAGNOSIS — D631 Anemia in chronic kidney disease: Secondary | ICD-10-CM | POA: Diagnosis not present

## 2019-11-21 DIAGNOSIS — N2581 Secondary hyperparathyroidism of renal origin: Secondary | ICD-10-CM | POA: Diagnosis not present

## 2019-11-21 DIAGNOSIS — Z992 Dependence on renal dialysis: Secondary | ICD-10-CM | POA: Diagnosis not present

## 2019-11-21 DIAGNOSIS — N186 End stage renal disease: Secondary | ICD-10-CM | POA: Diagnosis not present

## 2019-11-21 DIAGNOSIS — D509 Iron deficiency anemia, unspecified: Secondary | ICD-10-CM | POA: Diagnosis not present

## 2019-11-23 DIAGNOSIS — D631 Anemia in chronic kidney disease: Secondary | ICD-10-CM | POA: Diagnosis not present

## 2019-11-23 DIAGNOSIS — Z992 Dependence on renal dialysis: Secondary | ICD-10-CM | POA: Diagnosis not present

## 2019-11-23 DIAGNOSIS — N186 End stage renal disease: Secondary | ICD-10-CM | POA: Diagnosis not present

## 2019-11-23 DIAGNOSIS — D509 Iron deficiency anemia, unspecified: Secondary | ICD-10-CM | POA: Diagnosis not present

## 2019-11-23 DIAGNOSIS — N2581 Secondary hyperparathyroidism of renal origin: Secondary | ICD-10-CM | POA: Diagnosis not present

## 2019-11-26 DIAGNOSIS — D509 Iron deficiency anemia, unspecified: Secondary | ICD-10-CM | POA: Diagnosis not present

## 2019-11-26 DIAGNOSIS — D631 Anemia in chronic kidney disease: Secondary | ICD-10-CM | POA: Diagnosis not present

## 2019-11-26 DIAGNOSIS — Z992 Dependence on renal dialysis: Secondary | ICD-10-CM | POA: Diagnosis not present

## 2019-11-26 DIAGNOSIS — N186 End stage renal disease: Secondary | ICD-10-CM | POA: Diagnosis not present

## 2019-11-26 DIAGNOSIS — N2581 Secondary hyperparathyroidism of renal origin: Secondary | ICD-10-CM | POA: Diagnosis not present

## 2019-11-27 DIAGNOSIS — Z23 Encounter for immunization: Secondary | ICD-10-CM | POA: Diagnosis not present

## 2019-11-28 DIAGNOSIS — D509 Iron deficiency anemia, unspecified: Secondary | ICD-10-CM | POA: Diagnosis not present

## 2019-11-28 DIAGNOSIS — D631 Anemia in chronic kidney disease: Secondary | ICD-10-CM | POA: Diagnosis not present

## 2019-11-28 DIAGNOSIS — N2581 Secondary hyperparathyroidism of renal origin: Secondary | ICD-10-CM | POA: Diagnosis not present

## 2019-11-28 DIAGNOSIS — Z992 Dependence on renal dialysis: Secondary | ICD-10-CM | POA: Diagnosis not present

## 2019-11-28 DIAGNOSIS — N186 End stage renal disease: Secondary | ICD-10-CM | POA: Diagnosis not present

## 2019-11-30 DIAGNOSIS — D631 Anemia in chronic kidney disease: Secondary | ICD-10-CM | POA: Diagnosis not present

## 2019-11-30 DIAGNOSIS — D509 Iron deficiency anemia, unspecified: Secondary | ICD-10-CM | POA: Diagnosis not present

## 2019-11-30 DIAGNOSIS — N2581 Secondary hyperparathyroidism of renal origin: Secondary | ICD-10-CM | POA: Diagnosis not present

## 2019-11-30 DIAGNOSIS — N186 End stage renal disease: Secondary | ICD-10-CM | POA: Diagnosis not present

## 2019-11-30 DIAGNOSIS — Z992 Dependence on renal dialysis: Secondary | ICD-10-CM | POA: Diagnosis not present

## 2019-12-03 DIAGNOSIS — Z992 Dependence on renal dialysis: Secondary | ICD-10-CM | POA: Diagnosis not present

## 2019-12-03 DIAGNOSIS — D509 Iron deficiency anemia, unspecified: Secondary | ICD-10-CM | POA: Diagnosis not present

## 2019-12-03 DIAGNOSIS — N2581 Secondary hyperparathyroidism of renal origin: Secondary | ICD-10-CM | POA: Diagnosis not present

## 2019-12-03 DIAGNOSIS — D631 Anemia in chronic kidney disease: Secondary | ICD-10-CM | POA: Diagnosis not present

## 2019-12-03 DIAGNOSIS — N186 End stage renal disease: Secondary | ICD-10-CM | POA: Diagnosis not present

## 2019-12-04 DIAGNOSIS — Z992 Dependence on renal dialysis: Secondary | ICD-10-CM | POA: Diagnosis not present

## 2019-12-04 DIAGNOSIS — N186 End stage renal disease: Secondary | ICD-10-CM | POA: Diagnosis not present

## 2019-12-05 DIAGNOSIS — N2581 Secondary hyperparathyroidism of renal origin: Secondary | ICD-10-CM | POA: Diagnosis not present

## 2019-12-05 DIAGNOSIS — Z992 Dependence on renal dialysis: Secondary | ICD-10-CM | POA: Diagnosis not present

## 2019-12-05 DIAGNOSIS — N186 End stage renal disease: Secondary | ICD-10-CM | POA: Diagnosis not present

## 2019-12-05 DIAGNOSIS — D509 Iron deficiency anemia, unspecified: Secondary | ICD-10-CM | POA: Diagnosis not present

## 2019-12-05 DIAGNOSIS — D631 Anemia in chronic kidney disease: Secondary | ICD-10-CM | POA: Diagnosis not present

## 2019-12-07 DIAGNOSIS — D509 Iron deficiency anemia, unspecified: Secondary | ICD-10-CM | POA: Diagnosis not present

## 2019-12-07 DIAGNOSIS — Z992 Dependence on renal dialysis: Secondary | ICD-10-CM | POA: Diagnosis not present

## 2019-12-07 DIAGNOSIS — N2581 Secondary hyperparathyroidism of renal origin: Secondary | ICD-10-CM | POA: Diagnosis not present

## 2019-12-07 DIAGNOSIS — N186 End stage renal disease: Secondary | ICD-10-CM | POA: Diagnosis not present

## 2019-12-07 DIAGNOSIS — D631 Anemia in chronic kidney disease: Secondary | ICD-10-CM | POA: Diagnosis not present

## 2019-12-10 DIAGNOSIS — N186 End stage renal disease: Secondary | ICD-10-CM | POA: Diagnosis not present

## 2019-12-10 DIAGNOSIS — N2581 Secondary hyperparathyroidism of renal origin: Secondary | ICD-10-CM | POA: Diagnosis not present

## 2019-12-10 DIAGNOSIS — D631 Anemia in chronic kidney disease: Secondary | ICD-10-CM | POA: Diagnosis not present

## 2019-12-10 DIAGNOSIS — Z992 Dependence on renal dialysis: Secondary | ICD-10-CM | POA: Diagnosis not present

## 2019-12-10 DIAGNOSIS — D509 Iron deficiency anemia, unspecified: Secondary | ICD-10-CM | POA: Diagnosis not present

## 2019-12-12 DIAGNOSIS — Z992 Dependence on renal dialysis: Secondary | ICD-10-CM | POA: Diagnosis not present

## 2019-12-12 DIAGNOSIS — N2581 Secondary hyperparathyroidism of renal origin: Secondary | ICD-10-CM | POA: Diagnosis not present

## 2019-12-12 DIAGNOSIS — N186 End stage renal disease: Secondary | ICD-10-CM | POA: Diagnosis not present

## 2019-12-12 DIAGNOSIS — D631 Anemia in chronic kidney disease: Secondary | ICD-10-CM | POA: Diagnosis not present

## 2019-12-12 DIAGNOSIS — D509 Iron deficiency anemia, unspecified: Secondary | ICD-10-CM | POA: Diagnosis not present

## 2019-12-14 DIAGNOSIS — N186 End stage renal disease: Secondary | ICD-10-CM | POA: Diagnosis not present

## 2019-12-14 DIAGNOSIS — D631 Anemia in chronic kidney disease: Secondary | ICD-10-CM | POA: Diagnosis not present

## 2019-12-14 DIAGNOSIS — D509 Iron deficiency anemia, unspecified: Secondary | ICD-10-CM | POA: Diagnosis not present

## 2019-12-14 DIAGNOSIS — N2581 Secondary hyperparathyroidism of renal origin: Secondary | ICD-10-CM | POA: Diagnosis not present

## 2019-12-14 DIAGNOSIS — Z992 Dependence on renal dialysis: Secondary | ICD-10-CM | POA: Diagnosis not present

## 2019-12-17 DIAGNOSIS — Z992 Dependence on renal dialysis: Secondary | ICD-10-CM | POA: Diagnosis not present

## 2019-12-17 DIAGNOSIS — N2581 Secondary hyperparathyroidism of renal origin: Secondary | ICD-10-CM | POA: Diagnosis not present

## 2019-12-17 DIAGNOSIS — N186 End stage renal disease: Secondary | ICD-10-CM | POA: Diagnosis not present

## 2019-12-17 DIAGNOSIS — D631 Anemia in chronic kidney disease: Secondary | ICD-10-CM | POA: Diagnosis not present

## 2019-12-17 DIAGNOSIS — D509 Iron deficiency anemia, unspecified: Secondary | ICD-10-CM | POA: Diagnosis not present

## 2019-12-19 DIAGNOSIS — N2581 Secondary hyperparathyroidism of renal origin: Secondary | ICD-10-CM | POA: Diagnosis not present

## 2019-12-19 DIAGNOSIS — Z992 Dependence on renal dialysis: Secondary | ICD-10-CM | POA: Diagnosis not present

## 2019-12-19 DIAGNOSIS — D631 Anemia in chronic kidney disease: Secondary | ICD-10-CM | POA: Diagnosis not present

## 2019-12-19 DIAGNOSIS — D509 Iron deficiency anemia, unspecified: Secondary | ICD-10-CM | POA: Diagnosis not present

## 2019-12-19 DIAGNOSIS — N186 End stage renal disease: Secondary | ICD-10-CM | POA: Diagnosis not present

## 2019-12-21 DIAGNOSIS — D509 Iron deficiency anemia, unspecified: Secondary | ICD-10-CM | POA: Diagnosis not present

## 2019-12-21 DIAGNOSIS — N2581 Secondary hyperparathyroidism of renal origin: Secondary | ICD-10-CM | POA: Diagnosis not present

## 2019-12-21 DIAGNOSIS — Z992 Dependence on renal dialysis: Secondary | ICD-10-CM | POA: Diagnosis not present

## 2019-12-21 DIAGNOSIS — N186 End stage renal disease: Secondary | ICD-10-CM | POA: Diagnosis not present

## 2019-12-21 DIAGNOSIS — D631 Anemia in chronic kidney disease: Secondary | ICD-10-CM | POA: Diagnosis not present

## 2019-12-24 DIAGNOSIS — D631 Anemia in chronic kidney disease: Secondary | ICD-10-CM | POA: Diagnosis not present

## 2019-12-24 DIAGNOSIS — N2581 Secondary hyperparathyroidism of renal origin: Secondary | ICD-10-CM | POA: Diagnosis not present

## 2019-12-24 DIAGNOSIS — N186 End stage renal disease: Secondary | ICD-10-CM | POA: Diagnosis not present

## 2019-12-24 DIAGNOSIS — Z992 Dependence on renal dialysis: Secondary | ICD-10-CM | POA: Diagnosis not present

## 2019-12-24 DIAGNOSIS — D509 Iron deficiency anemia, unspecified: Secondary | ICD-10-CM | POA: Diagnosis not present

## 2019-12-26 DIAGNOSIS — Z992 Dependence on renal dialysis: Secondary | ICD-10-CM | POA: Diagnosis not present

## 2019-12-26 DIAGNOSIS — D631 Anemia in chronic kidney disease: Secondary | ICD-10-CM | POA: Diagnosis not present

## 2019-12-26 DIAGNOSIS — N2581 Secondary hyperparathyroidism of renal origin: Secondary | ICD-10-CM | POA: Diagnosis not present

## 2019-12-26 DIAGNOSIS — D509 Iron deficiency anemia, unspecified: Secondary | ICD-10-CM | POA: Diagnosis not present

## 2019-12-26 DIAGNOSIS — N186 End stage renal disease: Secondary | ICD-10-CM | POA: Diagnosis not present

## 2019-12-28 DIAGNOSIS — Z992 Dependence on renal dialysis: Secondary | ICD-10-CM | POA: Diagnosis not present

## 2019-12-28 DIAGNOSIS — N186 End stage renal disease: Secondary | ICD-10-CM | POA: Diagnosis not present

## 2019-12-28 DIAGNOSIS — D631 Anemia in chronic kidney disease: Secondary | ICD-10-CM | POA: Diagnosis not present

## 2019-12-28 DIAGNOSIS — D509 Iron deficiency anemia, unspecified: Secondary | ICD-10-CM | POA: Diagnosis not present

## 2019-12-28 DIAGNOSIS — N2581 Secondary hyperparathyroidism of renal origin: Secondary | ICD-10-CM | POA: Diagnosis not present

## 2019-12-31 DIAGNOSIS — N186 End stage renal disease: Secondary | ICD-10-CM | POA: Diagnosis not present

## 2019-12-31 DIAGNOSIS — D509 Iron deficiency anemia, unspecified: Secondary | ICD-10-CM | POA: Diagnosis not present

## 2019-12-31 DIAGNOSIS — Z992 Dependence on renal dialysis: Secondary | ICD-10-CM | POA: Diagnosis not present

## 2019-12-31 DIAGNOSIS — N2581 Secondary hyperparathyroidism of renal origin: Secondary | ICD-10-CM | POA: Diagnosis not present

## 2019-12-31 DIAGNOSIS — D631 Anemia in chronic kidney disease: Secondary | ICD-10-CM | POA: Diagnosis not present

## 2020-01-02 DIAGNOSIS — D509 Iron deficiency anemia, unspecified: Secondary | ICD-10-CM | POA: Diagnosis not present

## 2020-01-02 DIAGNOSIS — D631 Anemia in chronic kidney disease: Secondary | ICD-10-CM | POA: Diagnosis not present

## 2020-01-02 DIAGNOSIS — N186 End stage renal disease: Secondary | ICD-10-CM | POA: Diagnosis not present

## 2020-01-02 DIAGNOSIS — N2581 Secondary hyperparathyroidism of renal origin: Secondary | ICD-10-CM | POA: Diagnosis not present

## 2020-01-02 DIAGNOSIS — Z992 Dependence on renal dialysis: Secondary | ICD-10-CM | POA: Diagnosis not present

## 2020-01-04 DIAGNOSIS — Z992 Dependence on renal dialysis: Secondary | ICD-10-CM | POA: Diagnosis not present

## 2020-01-04 DIAGNOSIS — N186 End stage renal disease: Secondary | ICD-10-CM | POA: Diagnosis not present

## 2020-01-04 DIAGNOSIS — N2581 Secondary hyperparathyroidism of renal origin: Secondary | ICD-10-CM | POA: Diagnosis not present

## 2020-01-04 DIAGNOSIS — D509 Iron deficiency anemia, unspecified: Secondary | ICD-10-CM | POA: Diagnosis not present

## 2020-01-04 DIAGNOSIS — D631 Anemia in chronic kidney disease: Secondary | ICD-10-CM | POA: Diagnosis not present

## 2020-01-09 DIAGNOSIS — Z992 Dependence on renal dialysis: Secondary | ICD-10-CM | POA: Diagnosis not present

## 2020-01-09 DIAGNOSIS — D631 Anemia in chronic kidney disease: Secondary | ICD-10-CM | POA: Diagnosis not present

## 2020-01-09 DIAGNOSIS — N186 End stage renal disease: Secondary | ICD-10-CM | POA: Diagnosis not present

## 2020-01-09 DIAGNOSIS — N2581 Secondary hyperparathyroidism of renal origin: Secondary | ICD-10-CM | POA: Diagnosis not present

## 2020-01-09 DIAGNOSIS — D509 Iron deficiency anemia, unspecified: Secondary | ICD-10-CM | POA: Diagnosis not present

## 2020-01-14 DIAGNOSIS — E119 Type 2 diabetes mellitus without complications: Secondary | ICD-10-CM | POA: Diagnosis not present

## 2020-01-14 DIAGNOSIS — Z992 Dependence on renal dialysis: Secondary | ICD-10-CM | POA: Diagnosis not present

## 2020-02-04 DIAGNOSIS — Z992 Dependence on renal dialysis: Secondary | ICD-10-CM | POA: Diagnosis not present

## 2020-02-04 DIAGNOSIS — N186 End stage renal disease: Secondary | ICD-10-CM | POA: Diagnosis not present

## 2020-02-06 DIAGNOSIS — N186 End stage renal disease: Secondary | ICD-10-CM | POA: Diagnosis not present

## 2020-02-06 DIAGNOSIS — Z992 Dependence on renal dialysis: Secondary | ICD-10-CM | POA: Diagnosis not present

## 2020-02-06 DIAGNOSIS — N2581 Secondary hyperparathyroidism of renal origin: Secondary | ICD-10-CM | POA: Diagnosis not present

## 2020-02-06 DIAGNOSIS — D509 Iron deficiency anemia, unspecified: Secondary | ICD-10-CM | POA: Diagnosis not present

## 2020-02-06 DIAGNOSIS — D631 Anemia in chronic kidney disease: Secondary | ICD-10-CM | POA: Diagnosis not present

## 2020-02-18 DIAGNOSIS — Z992 Dependence on renal dialysis: Secondary | ICD-10-CM | POA: Diagnosis not present

## 2020-03-03 DIAGNOSIS — Z992 Dependence on renal dialysis: Secondary | ICD-10-CM | POA: Diagnosis not present

## 2020-03-03 DIAGNOSIS — N186 End stage renal disease: Secondary | ICD-10-CM | POA: Diagnosis not present

## 2020-03-05 DIAGNOSIS — D509 Iron deficiency anemia, unspecified: Secondary | ICD-10-CM | POA: Diagnosis not present

## 2020-03-05 DIAGNOSIS — D631 Anemia in chronic kidney disease: Secondary | ICD-10-CM | POA: Diagnosis not present

## 2020-03-05 DIAGNOSIS — N186 End stage renal disease: Secondary | ICD-10-CM | POA: Diagnosis not present

## 2020-03-05 DIAGNOSIS — N2581 Secondary hyperparathyroidism of renal origin: Secondary | ICD-10-CM | POA: Diagnosis not present

## 2020-03-05 DIAGNOSIS — Z992 Dependence on renal dialysis: Secondary | ICD-10-CM | POA: Diagnosis not present

## 2020-03-06 ENCOUNTER — Ambulatory Visit: Payer: Medicare Other | Admitting: Family Medicine

## 2020-03-10 ENCOUNTER — Other Ambulatory Visit: Payer: Self-pay | Admitting: Family Medicine

## 2020-03-11 ENCOUNTER — Ambulatory Visit: Payer: Medicare Other | Admitting: Family Medicine

## 2020-03-17 DIAGNOSIS — Z992 Dependence on renal dialysis: Secondary | ICD-10-CM | POA: Diagnosis not present

## 2020-03-27 ENCOUNTER — Other Ambulatory Visit: Payer: Self-pay

## 2020-03-27 ENCOUNTER — Encounter: Payer: Self-pay | Admitting: Family Medicine

## 2020-03-27 ENCOUNTER — Ambulatory Visit (INDEPENDENT_AMBULATORY_CARE_PROVIDER_SITE_OTHER): Payer: Medicare Other | Admitting: Family Medicine

## 2020-03-27 VITALS — BP 159/80 | Temp 77.0°F | Ht 67.75 in | Wt 179.4 lb

## 2020-03-27 DIAGNOSIS — I10 Essential (primary) hypertension: Secondary | ICD-10-CM

## 2020-03-27 DIAGNOSIS — Z794 Long term (current) use of insulin: Secondary | ICD-10-CM | POA: Diagnosis not present

## 2020-03-27 DIAGNOSIS — E785 Hyperlipidemia, unspecified: Secondary | ICD-10-CM

## 2020-03-27 DIAGNOSIS — E1140 Type 2 diabetes mellitus with diabetic neuropathy, unspecified: Secondary | ICD-10-CM

## 2020-03-27 DIAGNOSIS — D631 Anemia in chronic kidney disease: Secondary | ICD-10-CM

## 2020-03-27 DIAGNOSIS — N184 Chronic kidney disease, stage 4 (severe): Secondary | ICD-10-CM

## 2020-03-27 DIAGNOSIS — F3341 Major depressive disorder, recurrent, in partial remission: Secondary | ICD-10-CM | POA: Diagnosis not present

## 2020-03-27 MED ORDER — CITALOPRAM HYDROBROMIDE 40 MG PO TABS: 40.0000 mg | ORAL_TABLET | Freq: Every day | ORAL | 1 refills | Status: DC

## 2020-03-27 MED ORDER — LORATADINE 10 MG PO TABS: 10.0000 mg | ORAL_TABLET | Freq: Every day | ORAL | 1 refills | Status: DC

## 2020-03-27 NOTE — Patient Instructions (Signed)
Needs to increase NPH at night to 15 units,   Check glucose 2x per day.   On days not going to dialysis take 10 units at breakfast of NPH.   Recheck a1c in 42months.
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
## 2020-03-28 LAB — LIPID PANEL
Chol/HDL Ratio: 8.3 ratio — ABNORMAL HIGH (ref 0.0–5.0)
Cholesterol, Total: 257 mg/dL — ABNORMAL HIGH (ref 100–199)
HDL: 31 mg/dL — ABNORMAL LOW (ref >39)
LDL Chol Calc (NIH): 146 mg/dL — ABNORMAL HIGH (ref 0–99)
Triglycerides: 425 mg/dL — ABNORMAL HIGH (ref 0–149)
VLDL Cholesterol Cal: 80 mg/dL — ABNORMAL HIGH (ref 5–40)

## 2020-04-03 ENCOUNTER — Telehealth: Payer: Self-pay

## 2020-04-03 DIAGNOSIS — Z992 Dependence on renal dialysis: Secondary | ICD-10-CM | POA: Diagnosis not present

## 2020-04-03 DIAGNOSIS — N186 End stage renal disease: Secondary | ICD-10-CM | POA: Diagnosis not present

## 2020-04-03 MED ORDER — ATORVASTATIN CALCIUM 20 MG PO TABS: 20.0000 mg | ORAL_TABLET | Freq: Every day | ORAL | 1 refills | Status: DC

## 2020-04-03 NOTE — Telephone Encounter (Signed)
Tell the pt to take the medication atorvastatin at bedtime, daily. Thx. Dr .Lovena Le

## 2020-04-03 NOTE — Telephone Encounter (Signed)
Pt said cholesterol was high and dr was going to put him on a Cholesterol med and per the wife nothing was done. Also the wife said [facility] did not receive his Claritin and Celexa and both was confirmed by [facility] on 03/24  Pt call back [phone]

## 2020-04-03 NOTE — Telephone Encounter (Signed)
Left message to return call 

## 2020-04-04 ENCOUNTER — Telehealth: Payer: Self-pay | Admitting: Family Medicine

## 2020-04-04 DIAGNOSIS — N2581 Secondary hyperparathyroidism of renal origin: Secondary | ICD-10-CM | POA: Diagnosis not present

## 2020-04-04 DIAGNOSIS — N186 End stage renal disease: Secondary | ICD-10-CM | POA: Diagnosis not present

## 2020-04-04 DIAGNOSIS — D631 Anemia in chronic kidney disease: Secondary | ICD-10-CM | POA: Diagnosis not present

## 2020-04-04 DIAGNOSIS — Z992 Dependence on renal dialysis: Secondary | ICD-10-CM | POA: Diagnosis not present

## 2020-04-04 DIAGNOSIS — D509 Iron deficiency anemia, unspecified: Secondary | ICD-10-CM | POA: Diagnosis not present

## 2020-04-04 MED ORDER — "BD VEO INSULIN SYRINGE U/F 31G X 15/64"" 0.3 ML MISC": 2 refills | Status: AC

## 2020-04-04 NOTE — Telephone Encounter (Signed)
Patient aware medication has been called in- notified by pharmacy per POA([name])

## 2020-04-16 DIAGNOSIS — Z992 Dependence on renal dialysis: Secondary | ICD-10-CM | POA: Diagnosis not present

## 2020-04-16 DIAGNOSIS — E119 Type 2 diabetes mellitus without complications: Secondary | ICD-10-CM | POA: Diagnosis not present

## 2020-05-03 DIAGNOSIS — Z992 Dependence on renal dialysis: Secondary | ICD-10-CM | POA: Diagnosis not present

## 2020-05-03 DIAGNOSIS — N186 End stage renal disease: Secondary | ICD-10-CM | POA: Diagnosis not present

## 2020-05-05 DIAGNOSIS — Z992 Dependence on renal dialysis: Secondary | ICD-10-CM | POA: Diagnosis not present

## 2020-05-05 DIAGNOSIS — D631 Anemia in chronic kidney disease: Secondary | ICD-10-CM | POA: Diagnosis not present

## 2020-05-05 DIAGNOSIS — N2581 Secondary hyperparathyroidism of renal origin: Secondary | ICD-10-CM | POA: Diagnosis not present

## 2020-05-05 DIAGNOSIS — N186 End stage renal disease: Secondary | ICD-10-CM | POA: Diagnosis not present

## 2020-05-05 DIAGNOSIS — D509 Iron deficiency anemia, unspecified: Secondary | ICD-10-CM | POA: Diagnosis not present

## 2020-05-12 DIAGNOSIS — Z992 Dependence on renal dialysis: Secondary | ICD-10-CM | POA: Diagnosis not present

## 2020-06-03 DIAGNOSIS — Z992 Dependence on renal dialysis: Secondary | ICD-10-CM | POA: Diagnosis not present

## 2020-06-03 DIAGNOSIS — N186 End stage renal disease: Secondary | ICD-10-CM | POA: Diagnosis not present

## 2020-06-04 DIAGNOSIS — D509 Iron deficiency anemia, unspecified: Secondary | ICD-10-CM | POA: Diagnosis not present

## 2020-06-04 DIAGNOSIS — D631 Anemia in chronic kidney disease: Secondary | ICD-10-CM | POA: Diagnosis not present

## 2020-06-04 DIAGNOSIS — N186 End stage renal disease: Secondary | ICD-10-CM | POA: Diagnosis not present

## 2020-06-04 DIAGNOSIS — N2581 Secondary hyperparathyroidism of renal origin: Secondary | ICD-10-CM | POA: Diagnosis not present

## 2020-06-04 DIAGNOSIS — Z992 Dependence on renal dialysis: Secondary | ICD-10-CM | POA: Diagnosis not present

## 2020-06-16 DIAGNOSIS — Z992 Dependence on renal dialysis: Secondary | ICD-10-CM | POA: Diagnosis not present

## 2020-07-03 DIAGNOSIS — Z992 Dependence on renal dialysis: Secondary | ICD-10-CM | POA: Diagnosis not present

## 2020-07-03 DIAGNOSIS — N186 End stage renal disease: Secondary | ICD-10-CM | POA: Diagnosis not present

## 2020-07-04 DIAGNOSIS — N186 End stage renal disease: Secondary | ICD-10-CM | POA: Diagnosis not present

## 2020-07-04 DIAGNOSIS — D631 Anemia in chronic kidney disease: Secondary | ICD-10-CM | POA: Diagnosis not present

## 2020-07-04 DIAGNOSIS — Z992 Dependence on renal dialysis: Secondary | ICD-10-CM | POA: Diagnosis not present

## 2020-07-04 DIAGNOSIS — N2581 Secondary hyperparathyroidism of renal origin: Secondary | ICD-10-CM | POA: Diagnosis not present

## 2020-07-04 DIAGNOSIS — D509 Iron deficiency anemia, unspecified: Secondary | ICD-10-CM | POA: Diagnosis not present

## 2020-07-14 DIAGNOSIS — Z992 Dependence on renal dialysis: Secondary | ICD-10-CM | POA: Diagnosis not present

## 2020-07-14 DIAGNOSIS — E119 Type 2 diabetes mellitus without complications: Secondary | ICD-10-CM | POA: Diagnosis not present

## 2020-08-03 DIAGNOSIS — N186 End stage renal disease: Secondary | ICD-10-CM | POA: Diagnosis not present

## 2020-08-03 DIAGNOSIS — Z992 Dependence on renal dialysis: Secondary | ICD-10-CM | POA: Diagnosis not present

## 2020-08-04 DIAGNOSIS — D509 Iron deficiency anemia, unspecified: Secondary | ICD-10-CM | POA: Diagnosis not present

## 2020-08-04 DIAGNOSIS — N2581 Secondary hyperparathyroidism of renal origin: Secondary | ICD-10-CM | POA: Diagnosis not present

## 2020-08-04 DIAGNOSIS — N186 End stage renal disease: Secondary | ICD-10-CM | POA: Diagnosis not present

## 2020-08-04 DIAGNOSIS — Z992 Dependence on renal dialysis: Secondary | ICD-10-CM | POA: Diagnosis not present

## 2020-08-04 DIAGNOSIS — D631 Anemia in chronic kidney disease: Secondary | ICD-10-CM | POA: Diagnosis not present

## 2020-08-11 DIAGNOSIS — Z992 Dependence on renal dialysis: Secondary | ICD-10-CM | POA: Diagnosis not present

## 2020-09-03 DIAGNOSIS — N186 End stage renal disease: Secondary | ICD-10-CM | POA: Diagnosis not present

## 2020-09-03 DIAGNOSIS — Z992 Dependence on renal dialysis: Secondary | ICD-10-CM | POA: Diagnosis not present

## 2020-09-05 DIAGNOSIS — N186 End stage renal disease: Secondary | ICD-10-CM | POA: Diagnosis not present

## 2020-09-05 DIAGNOSIS — D631 Anemia in chronic kidney disease: Secondary | ICD-10-CM | POA: Diagnosis not present

## 2020-09-05 DIAGNOSIS — Z992 Dependence on renal dialysis: Secondary | ICD-10-CM | POA: Diagnosis not present

## 2020-09-05 DIAGNOSIS — D509 Iron deficiency anemia, unspecified: Secondary | ICD-10-CM | POA: Diagnosis not present

## 2020-09-15 DIAGNOSIS — Z992 Dependence on renal dialysis: Secondary | ICD-10-CM | POA: Diagnosis not present

## 2020-10-01 ENCOUNTER — Other Ambulatory Visit: Payer: Self-pay | Admitting: Family Medicine

## 2020-10-01 DIAGNOSIS — F3341 Major depressive disorder, recurrent, in partial remission: Secondary | ICD-10-CM

## 2020-10-03 DIAGNOSIS — N186 End stage renal disease: Secondary | ICD-10-CM | POA: Diagnosis not present

## 2020-10-03 DIAGNOSIS — Z992 Dependence on renal dialysis: Secondary | ICD-10-CM | POA: Diagnosis not present

## 2020-10-06 ENCOUNTER — Telehealth: Payer: Self-pay | Admitting: Family Medicine

## 2020-10-06 DIAGNOSIS — Z125 Encounter for screening for malignant neoplasm of prostate: Secondary | ICD-10-CM

## 2020-10-06 DIAGNOSIS — E785 Hyperlipidemia, unspecified: Secondary | ICD-10-CM

## 2020-10-06 DIAGNOSIS — D631 Anemia in chronic kidney disease: Secondary | ICD-10-CM | POA: Diagnosis not present

## 2020-10-06 DIAGNOSIS — D509 Iron deficiency anemia, unspecified: Secondary | ICD-10-CM | POA: Diagnosis not present

## 2020-10-06 DIAGNOSIS — N186 End stage renal disease: Secondary | ICD-10-CM | POA: Diagnosis not present

## 2020-10-06 DIAGNOSIS — N2581 Secondary hyperparathyroidism of renal origin: Secondary | ICD-10-CM | POA: Diagnosis not present

## 2020-10-06 DIAGNOSIS — Z794 Long term (current) use of insulin: Secondary | ICD-10-CM

## 2020-10-06 DIAGNOSIS — E1140 Type 2 diabetes mellitus with diabetic neuropathy, unspecified: Secondary | ICD-10-CM

## 2020-10-06 DIAGNOSIS — F3341 Major depressive disorder, recurrent, in partial remission: Secondary | ICD-10-CM

## 2020-10-06 DIAGNOSIS — Z992 Dependence on renal dialysis: Secondary | ICD-10-CM | POA: Diagnosis not present

## 2020-10-06 MED ORDER — LORATADINE 10 MG PO TABS: 10.0000 mg | ORAL_TABLET | Freq: Every day | ORAL | 0 refills | Status: DC

## 2020-10-06 MED ORDER — CITALOPRAM HYDROBROMIDE 40 MG PO TABS: 40.0000 mg | ORAL_TABLET | Freq: Every day | ORAL | 0 refills | Status: DC

## 2020-10-06 NOTE — Telephone Encounter (Signed)
Correction: Patient has appointment 11/10 not 11/3 to establish care with Dr. Lacinda Axon as well as med follow up  Patient requests refill on antidepressants and allergy meds to hold over till appointment on 11/10.  [place]  CB#  [phone]

## 2020-10-06 NOTE — Telephone Encounter (Signed)
1.  Please verify that the patient is stable and doing well  If so may have 90-day on medicines Also should do A1c, lipid, also do PSA if not has had one done within the past year

## 2020-10-06 NOTE — Telephone Encounter (Signed)
Patient states he is doing well on current meds and having no problems. Prescriptions sent electronically to pharmacy. Blood work ordered in [employer]. Patient aware.

## 2020-10-06 NOTE — Telephone Encounter (Signed)
FYI Patient has appointment 11/10 with Dr. Lacinda Axon. Stated they could not do labs before appointment because insurance would not pay for it. Will be bring previous labs for A1C.  CB#  [phone]

## 2020-10-06 NOTE — Telephone Encounter (Signed)
Patient has appointment 11/3

## 2020-10-13 DIAGNOSIS — Z992 Dependence on renal dialysis: Secondary | ICD-10-CM | POA: Diagnosis not present

## 2020-10-13 DIAGNOSIS — E119 Type 2 diabetes mellitus without complications: Secondary | ICD-10-CM | POA: Diagnosis not present

## 2020-10-22 NOTE — Telephone Encounter (Signed)
Error please close

## 2020-11-03 DIAGNOSIS — Z992 Dependence on renal dialysis: Secondary | ICD-10-CM | POA: Diagnosis not present

## 2020-11-03 DIAGNOSIS — N186 End stage renal disease: Secondary | ICD-10-CM | POA: Diagnosis not present

## 2020-11-05 DIAGNOSIS — Z992 Dependence on renal dialysis: Secondary | ICD-10-CM | POA: Diagnosis not present

## 2020-11-05 DIAGNOSIS — N2581 Secondary hyperparathyroidism of renal origin: Secondary | ICD-10-CM | POA: Diagnosis not present

## 2020-11-05 DIAGNOSIS — D509 Iron deficiency anemia, unspecified: Secondary | ICD-10-CM | POA: Diagnosis not present

## 2020-11-05 DIAGNOSIS — N186 End stage renal disease: Secondary | ICD-10-CM | POA: Diagnosis not present

## 2020-11-05 DIAGNOSIS — D631 Anemia in chronic kidney disease: Secondary | ICD-10-CM | POA: Diagnosis not present

## 2020-11-11 ENCOUNTER — Encounter (HOSPITAL_COMMUNITY): Payer: Self-pay | Admitting: Hematology & Oncology

## 2020-11-13 ENCOUNTER — Ambulatory Visit: Payer: Medicare Other | Admitting: Family Medicine

## 2020-11-13 ENCOUNTER — Telehealth: Payer: Self-pay | Admitting: Family Medicine

## 2020-11-13 MED ORDER — ATORVASTATIN CALCIUM 20 MG PO TABS: 20.0000 mg | ORAL_TABLET | Freq: Every day | ORAL | 0 refills | Status: DC

## 2020-11-13 NOTE — Telephone Encounter (Signed)
Appointment rescheduled for 12/1. Needs refill for cholesterol meds.   [place]  Pt#: [phone]

## 2020-11-13 NOTE — Telephone Encounter (Signed)
Prescription sent electronically to pharmacy  Left message to return call 

## 2020-11-13 NOTE — Telephone Encounter (Signed)
Patient made aware of his atorvastatin refills sent to pharmacy

## 2020-11-17 DIAGNOSIS — Z992 Dependence on renal dialysis: Secondary | ICD-10-CM | POA: Diagnosis not present

## 2020-12-03 DIAGNOSIS — N186 End stage renal disease: Secondary | ICD-10-CM | POA: Diagnosis not present

## 2020-12-03 DIAGNOSIS — Z992 Dependence on renal dialysis: Secondary | ICD-10-CM | POA: Diagnosis not present

## 2020-12-04 ENCOUNTER — Ambulatory Visit (INDEPENDENT_AMBULATORY_CARE_PROVIDER_SITE_OTHER): Payer: Medicare Other | Admitting: Family Medicine

## 2020-12-04 ENCOUNTER — Other Ambulatory Visit: Payer: Self-pay

## 2020-12-04 VITALS — BP 173/106 | HR 72 | Temp 97.5°F | Ht 67.75 in | Wt 176.0 lb

## 2020-12-04 DIAGNOSIS — E118 Type 2 diabetes mellitus with unspecified complications: Secondary | ICD-10-CM | POA: Diagnosis not present

## 2020-12-04 DIAGNOSIS — H548 Legal blindness, as defined in USA: Secondary | ICD-10-CM | POA: Insufficient documentation

## 2020-12-04 DIAGNOSIS — N186 End stage renal disease: Secondary | ICD-10-CM | POA: Insufficient documentation

## 2020-12-04 DIAGNOSIS — E785 Hyperlipidemia, unspecified: Secondary | ICD-10-CM

## 2020-12-04 DIAGNOSIS — I10 Essential (primary) hypertension: Secondary | ICD-10-CM | POA: Diagnosis not present

## 2020-12-04 DIAGNOSIS — E11319 Type 2 diabetes mellitus with unspecified diabetic retinopathy without macular edema: Secondary | ICD-10-CM | POA: Diagnosis not present

## 2020-12-04 DIAGNOSIS — Z992 Dependence on renal dialysis: Secondary | ICD-10-CM

## 2020-12-04 NOTE — Assessment & Plan Note (Signed)
At goal. Continue NPH 15 units QHS.

## 2020-12-04 NOTE — Patient Instructions (Signed)
Continue your current medications.  Lab today.  Follow up in 6 months.  Take care  Dr. Lacinda Axon

## 2020-12-04 NOTE — Assessment & Plan Note (Signed)
Uncontrolled. Lipid panel today. Will likely need increase in statin therapy and may need treatment of triglycerides if they are still markedly elevated.

## 2020-12-04 NOTE — Progress Notes (Signed)
Subjective:  Patient ID: Paul Gonzalez, male    DOB: 11-03-1965  Age: 55 y.o. MRN: [id number]  CC: Chief Complaint  Patient presents with   Diabetes    On [facility] mon, wed, fri , A1c 6.6 done at [facility], cholesterol check   Hyperlipidemia    HPI:  55 year old male with Type 2 Diabetes, HTN, HLD, ESRD on HD MWF presents for follow up.  Type 2 Diabetes A1c done at HD in October and was 6.6. He is compliant with NPH 15 units nightly. He adjusts it up to 20 units if needed.  Has multiple complications from diabetes - neuropathy, retinopathy, renal disease. Has not had his eyes checked recently.  Hypertension Is off meds at this time. Has had issues with hypotension associated with HD.  Hyperlipidemia Uncontrolled. Is on Lipitor. Has been exercising and eating better. Desires lipid panel today.   Patient Active Problem List   Diagnosis Date Noted   ESRD (end stage renal disease) on dialysis ([place]) 12/04/2020   Legally blind 12/04/2020   Diabetic retinopathy ([place]) 12/04/2020   Depression 01/21/2014   Anemia in CKD (chronic kidney disease) 12/11/2013   Diabetic neuropathy, type II diabetes mellitus ([place]) 04/10/2013   HTN (hypertension), benign 01/05/2013   Type 2 diabetes mellitus with complications ([place]) 46/56/8127   Hyperlipidemia 01/05/2013    Social Hx   Social History   Socioeconomic History   Marital status: Significant Other    Spouse name: Not on file   Number of children: Not on file   Years of education: Not on file   Highest education level: Not on file  Occupational History   Not on file  Tobacco Use   Smoking status: Never   Smokeless tobacco: Never  Substance and Sexual Activity   Alcohol use: Yes    Comment: rarely   Drug use: No   Sexual activity: Not on file  Other Topics Concern   Not on file  Social History Narrative   ** Merged History Encounter **       Social Determinants of Health   Financial Resource Strain: Not on file  Food  Insecurity: Not on file  Transportation Needs: Not on file  Physical Activity: Not on file  Stress: Not on file  Social Connections: Not on file    Review of Systems  Constitutional: Negative.   Eyes:        Poor vision.   Respiratory: Negative.    Cardiovascular: Negative.     Objective:  BP (!) 173/106   Pulse 72   Temp (!) 97.5 F (36.4 C)   Ht 5' 7.75" (1.721 m)   Wt 176 lb (79.8 kg)   SpO2 97%   BMI 26.96 kg/m   BP/Weight 12/04/2020 03/27/2020 05/21/15  Systolic BP 494 496 759  Diastolic BP 163 80 74  Wt. (Lbs) 176 179.4 179.4  BMI 26.96 27.48 27.48    Physical Exam Constitutional:      General: He is not in acute distress. HENT:     Head: Normocephalic and atraumatic.  Eyes:     General:        Right eye: No discharge.        Left eye: No discharge.     Conjunctiva/sclera: Conjunctivae normal.  Cardiovascular:     Rate and Rhythm: Normal rate and regular rhythm.     Heart sounds: No murmur heard. Pulmonary:     Effort: Pulmonary effort is normal.     Breath sounds:  Normal breath sounds. No wheezing, rhonchi or rales.  Abdominal:     General: There is no distension.     Palpations: Abdomen is soft.     Tenderness: There is no abdominal tenderness.  Neurological:     Mental Status: He is alert.  Psychiatric:        Mood and Affect: Mood normal.        Behavior: Behavior normal.  Diabetic Foot Check -  Appearance - no lesions, ulcers or calluses Skin - no unusual pallor or redness Has neuropathy. Sensation decreased.    Lab Results  Component Value Date   WBC 6.1 04/24/2014   HGB 11.9 (L) 06/28/2017   HCT 35.0 (L) 06/28/2017   PLT 186 04/24/2014   GLUCOSE 104 (H) 06/28/2017   CHOL 257 (H) 03/27/2020   TRIG 425 (H) 03/27/2020   HDL 31 (L) 03/27/2020   LDLCALC 146 (H) 03/27/2020   ALT 23 10/27/2017   AST 14 10/27/2017   NA 141 06/28/2017   K 4.4 06/28/2017   CL 101 06/28/2017   CREATININE 4.60 (H) 06/28/2017   BUN 35 (H) 06/28/2017    CO2 28 06/10/2016   INR 1.16 03/02/2013   HGBA1C 6.4 10/24/2017     Assessment & Plan:   Problem List Items Addressed This Visit       Cardiovascular and Mediastinum   HTN (hypertension), benign    BP labile.  Elevated today. Defer to nephrology given hypotension with HD.        Endocrine   Type 2 diabetes mellitus with complications ([place]) - Primary    At goal. Continue NPH 15 units QHS.      Diabetic retinopathy ([place])     Genitourinary   ESRD (end stage renal disease) on dialysis ([place])     Other   Hyperlipidemia    Uncontrolled. Lipid panel today. Will likely need increase in statin therapy and may need treatment of triglycerides if they are still markedly elevated.      Relevant Orders   Lipid Profile    Follow-up:  6 months  [place] DO [place]

## 2020-12-04 NOTE — Assessment & Plan Note (Signed)
BP labile.  Elevated today. Defer to nephrology given hypotension with HD.

## 2020-12-05 DIAGNOSIS — N2581 Secondary hyperparathyroidism of renal origin: Secondary | ICD-10-CM | POA: Diagnosis not present

## 2020-12-05 DIAGNOSIS — Z992 Dependence on renal dialysis: Secondary | ICD-10-CM | POA: Diagnosis not present

## 2020-12-05 DIAGNOSIS — N186 End stage renal disease: Secondary | ICD-10-CM | POA: Diagnosis not present

## 2020-12-05 DIAGNOSIS — D509 Iron deficiency anemia, unspecified: Secondary | ICD-10-CM | POA: Diagnosis not present

## 2020-12-05 DIAGNOSIS — D631 Anemia in chronic kidney disease: Secondary | ICD-10-CM | POA: Diagnosis not present

## 2020-12-05 LAB — LIPID PANEL
Chol/HDL Ratio: 3.7 ratio (ref 0.0–5.0)
Cholesterol, Total: 132 mg/dL (ref 100–199)
HDL: 36 mg/dL — ABNORMAL LOW (ref >39)
LDL Chol Calc (NIH): 65 mg/dL (ref 0–99)
Triglycerides: 182 mg/dL — ABNORMAL HIGH (ref 0–149)
VLDL Cholesterol Cal: 31 mg/dL (ref 5–40)

## 2020-12-05 NOTE — Telephone Encounter (Signed)
Patient had appointment on 12/04/20

## 2020-12-08 ENCOUNTER — Other Ambulatory Visit: Payer: Self-pay | Admitting: Family Medicine

## 2020-12-08 MED ORDER — ICOSAPENT ETHYL 1 G PO CAPS: 2.0000 g | ORAL_CAPSULE | Freq: Two times a day (BID) | ORAL | 1 refills | Status: DC

## 2020-12-09 ENCOUNTER — Other Ambulatory Visit: Payer: Self-pay | Admitting: Family Medicine

## 2020-12-09 ENCOUNTER — Telehealth: Payer: Self-pay | Admitting: Family Medicine

## 2020-12-09 MED ORDER — FENOFIBRATE 145 MG PO TABS: 145.0000 mg | ORAL_TABLET | Freq: Every day | ORAL | 1 refills | Status: DC

## 2020-12-09 NOTE — Telephone Encounter (Signed)
Pt POA Greg[place] Signs contacted and verbalized understanding.

## 2020-12-09 NOTE — Telephone Encounter (Signed)
Please advise. Thank you

## 2020-12-09 NOTE — Progress Notes (Signed)
New Rx sent due to cost of Vascepa.

## 2020-12-09 NOTE — Telephone Encounter (Signed)
Patient states Icosapent Ethyl is too expensive at [facility] the cost is 344.60 is there something  cheaper he can take. [facility]

## 2020-12-13 ENCOUNTER — Encounter: Payer: Self-pay | Admitting: Emergency Medicine

## 2020-12-13 ENCOUNTER — Ambulatory Visit
Admission: EM | Admit: 2020-12-13 | Discharge: 2020-12-13 | Disposition: A | Discharge status: Home / Self Care | Payer: Medicare Other | Attending: Urgent Care | Admitting: Urgent Care

## 2020-12-13 ENCOUNTER — Encounter (HOSPITAL_COMMUNITY): Payer: Self-pay | Admitting: Hematology & Oncology

## 2020-12-13 ENCOUNTER — Other Ambulatory Visit: Payer: Self-pay

## 2020-12-13 ENCOUNTER — Ambulatory Visit (INDEPENDENT_AMBULATORY_CARE_PROVIDER_SITE_OTHER): Payer: Medicare Other

## 2020-12-13 DIAGNOSIS — J029 Acute pharyngitis, unspecified: Secondary | ICD-10-CM | POA: Diagnosis not present

## 2020-12-13 DIAGNOSIS — R059 Cough, unspecified: Secondary | ICD-10-CM | POA: Diagnosis not present

## 2020-12-13 DIAGNOSIS — B349 Viral infection, unspecified: Secondary | ICD-10-CM

## 2020-12-13 DIAGNOSIS — N186 End stage renal disease: Secondary | ICD-10-CM

## 2020-12-13 DIAGNOSIS — Z20822 Contact with and (suspected) exposure to covid-19: Secondary | ICD-10-CM | POA: Diagnosis not present

## 2020-12-13 DIAGNOSIS — R079 Chest pain, unspecified: Secondary | ICD-10-CM

## 2020-12-13 MED ORDER — BENZONATATE 100 MG PO CAPS: 100.0000 mg | ORAL_CAPSULE | Freq: Three times a day (TID) | ORAL | 0 refills | Status: DC | PRN

## 2020-12-13 NOTE — Discharge Instructions (Signed)
We will notify you of your test results as they arrive and may take between 48-72 hours.  I encourage you to sign up for MyChart if you have not already done so as this can be the easiest way for [place] to communicate results to you online or through a phone app.  Generally, we only contact you if it is a positive test result.  In the meantime, if you develop worsening symptoms including fever, chest pain, shortness of breath despite our current treatment plan then please report to the emergency room as this may be a sign of worsening status from possible viral infection.  Otherwise, we will manage this as a viral syndrome. For sore throat or cough try using a honey-based tea. Use 3 teaspoons of honey with juice squeezed from half lemon. Place shaved pieces of ginger into 1/2-1 cup of water and warm over stove top. Then mix the ingredients and repeat every 4 hours as needed. Please take Tylenol 500mg -650mg  every 6 hours for aches and pains, fevers. Hydrate very well with at least 2 liters of water. Eat light meals such as soups to replenish electrolytes and soft fruits, veggies. Start the cough capsule to help you with that symptoms. If your test is positive for influenza I will recommend you get prescribed Tamiflu because you are high risk from having end stage kidney disease.

## 2020-12-13 NOTE — ED Triage Notes (Signed)
Sore throat, chest congestion, cough since Thursday.
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
## 2020-12-14 LAB — COVID-19, FLU A+B NAA
Influenza A, NAA: NOT DETECTED
Influenza B, NAA: NOT DETECTED
SARS-CoV-2, NAA: DETECTED — AB

## 2020-12-15 DIAGNOSIS — Z992 Dependence on renal dialysis: Secondary | ICD-10-CM | POA: Diagnosis not present

## 2020-12-16 ENCOUNTER — Other Ambulatory Visit: Payer: Self-pay

## 2020-12-16 ENCOUNTER — Ambulatory Visit (INDEPENDENT_AMBULATORY_CARE_PROVIDER_SITE_OTHER): Payer: Medicare Other

## 2020-12-16 ENCOUNTER — Encounter (HOSPITAL_COMMUNITY): Payer: Self-pay | Admitting: Hematology & Oncology

## 2020-12-16 VITALS — Ht 68.0 in | Wt 176.0 lb

## 2020-12-16 DIAGNOSIS — E11319 Type 2 diabetes mellitus with unspecified diabetic retinopathy without macular edema: Secondary | ICD-10-CM

## 2020-12-16 DIAGNOSIS — Z992 Dependence on renal dialysis: Secondary | ICD-10-CM

## 2020-12-16 DIAGNOSIS — N186 End stage renal disease: Secondary | ICD-10-CM | POA: Diagnosis not present

## 2020-12-16 DIAGNOSIS — Z0000 Encounter for general adult medical examination without abnormal findings: Secondary | ICD-10-CM | POA: Diagnosis not present

## 2020-12-16 DIAGNOSIS — E118 Type 2 diabetes mellitus with unspecified complications: Secondary | ICD-10-CM

## 2020-12-16 DIAGNOSIS — Z5941 Food insecurity: Secondary | ICD-10-CM | POA: Diagnosis not present

## 2020-12-16 DIAGNOSIS — I10 Essential (primary) hypertension: Secondary | ICD-10-CM | POA: Diagnosis not present

## 2020-12-16 DIAGNOSIS — E785 Hyperlipidemia, unspecified: Secondary | ICD-10-CM | POA: Diagnosis not present

## 2020-12-16 NOTE — Addendum Note (Signed)
Addended by: Randal Buba K on: 12/16/2020 11:34 AM   Modules accepted: Orders
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
## 2020-12-16 NOTE — Patient Instructions (Signed)
[name] , Thank you for taking time to come for your Medicare Wellness Visit. I appreciate your ongoing commitment to your health goals. Please review the following plan we discussed and let me know if I can assist you in the future.   Screening recommendations/referrals: Colonoscopy: Done 08/12/2016 Repeat in 10 years  Recommended yearly ophthalmology/optometry visit for glaucoma screening and checkup Recommended yearly dental visit for hygiene and checkup  Vaccinations: Influenza vaccine: Done 11/04/2020 Repeat annually  Pneumococcal vaccine: Done 01/10/2013 and 09/06/2018 Tdap vaccine: Due Repeat in 10 years  Shingles vaccine: Shingrix discussed. Please contact your pharmacy for coverage information.     Covid-19: Done 03/22/2019 and 04/19/2019  Advanced directives: Copy in chart.  Conditions/risks identified: KEEP UP THE GOOD WORK!!  Next appointment: Follow up in one year for your annual wellness visit 2023.  Preventive Care 40-64 Years, Male Preventive care refers to lifestyle choices and visits with your health care provider that can promote health and wellness. What does preventive care include? A yearly physical exam. This is also called an annual well check. Dental exams once or twice a year. Routine eye exams. Ask your health care provider how often you should have your eyes checked. Personal lifestyle choices, including: Daily care of your teeth and gums. Regular physical activity. Eating a healthy diet. Avoiding tobacco and drug use. Limiting alcohol use. Practicing safe sex. Taking low-dose aspirin every day starting at age 24. What happens during an annual well check? The services and screenings done by your health care provider during your annual well check will depend on your age, overall health, lifestyle risk factors, and family history of disease. Counseling  Your health care provider may ask you questions about your: Alcohol use. Tobacco use. Drug  use. Emotional well-being. Home and relationship well-being. Sexual activity. Eating habits. Work and work Statistician. Screening  You may have the following tests or measurements: Height, weight, and BMI. Blood pressure. Lipid and cholesterol levels. These may be checked every 5 years, or more frequently if you are over 27 years old. Skin check. Lung cancer screening. You may have this screening every year starting at age 71 if you have a 30-pack-year history of smoking and currently smoke or have quit within the past 15 years. Fecal occult blood test (FOBT) of the stool. You may have this test every year starting at age 41. Flexible sigmoidoscopy or colonoscopy. You may have a sigmoidoscopy every 5 years or a colonoscopy every 10 years starting at age 31. Prostate cancer screening. Recommendations will vary depending on your family history and other risks. Hepatitis C blood test. Hepatitis B blood test. Sexually transmitted disease (STD) testing. Diabetes screening. This is done by checking your blood sugar (glucose) after you have not eaten for a while (fasting). You may have this done every 1-3 years. Discuss your test results, treatment options, and if necessary, the need for more tests with your health care provider. Vaccines  Your health care provider may recommend certain vaccines, such as: Influenza vaccine. This is recommended every year. Tetanus, diphtheria, and acellular pertussis (Tdap, Td) vaccine. You may need a Td booster every 10 years. Zoster vaccine. You may need this after age 52. Pneumococcal 13-valent conjugate (PCV13) vaccine. You may need this if you have certain conditions and have not been vaccinated. Pneumococcal polysaccharide (PPSV23) vaccine. You may need one or two doses if you smoke cigarettes or if you have certain conditions. Talk to your health care provider about which screenings and vaccines you  need and how often you need them. This information is  not intended to replace advice given to you by your health care provider. Make sure you discuss any questions you have with your health care provider. Document Released: 01/17/2015 Document Revised: 09/10/2015 Document Reviewed: 10/22/2014 Elsevier Interactive Patient Education  2017 [place] Prevention in the Home Falls can cause injuries. They can happen to people of all ages. There are many things you can do to make your home safe and to help prevent falls. What can I do on the outside of my home? Regularly fix the edges of walkways and driveways and fix any cracks. Remove anything that might make you trip as you walk through a door, such as a raised step or threshold. Trim any bushes or trees on the path to your home. Use bright outdoor lighting. Clear any walking paths of anything that might make someone trip, such as rocks or tools. Regularly check to see if handrails are loose or broken. Make sure that both sides of any steps have handrails. Any raised decks and porches should have guardrails on the edges. Have any leaves, snow, or ice cleared regularly. Use sand or salt on walking paths during winter. Clean up any spills in your garage right away. This includes oil or grease spills. What can I do in the bathroom? Use night lights. Install grab bars by the toilet and in the tub and shower. Do not use towel bars as grab bars. Use non-skid mats or decals in the tub or shower. If you need to sit down in the shower, use a plastic, non-slip stool. Keep the floor dry. Clean up any water that spills on the floor as soon as it happens. Remove soap buildup in the tub or shower regularly. Attach bath mats securely with double-sided non-slip rug tape. Do not have throw rugs and other things on the floor that can make you trip. What can I do in the bedroom? Use night lights. Make sure that you have a light by your bed that is easy to reach. Do not use any sheets or blankets that  are too big for your bed. They should not hang down onto the floor. Have a firm chair that has side arms. You can use this for support while you get dressed. Do not have throw rugs and other things on the floor that can make you trip. What can I do in the kitchen? Clean up any spills right away. Avoid walking on wet floors. Keep items that you use a lot in easy-to-reach places. If you need to reach something above you, use a strong step stool that has a grab bar. Keep electrical cords out of the way. Do not use floor polish or wax that makes floors slippery. If you must use wax, use non-skid floor wax. Do not have throw rugs and other things on the floor that can make you trip. What can I do with my stairs? Do not leave any items on the stairs. Make sure that there are handrails on both sides of the stairs and use them. Fix handrails that are broken or loose. Make sure that handrails are as long as the stairways. Check any carpeting to make sure that it is firmly attached to the stairs. Fix any carpet that is loose or worn. Avoid having throw rugs at the top or bottom of the stairs. If you do have throw rugs, attach them to the floor with carpet tape. Make sure that  you have a light switch at the top of the stairs and the bottom of the stairs. If you do not have them, ask someone to add them for you. What else can I do to help prevent falls? Wear shoes that: Do not have high heels. Have rubber bottoms. Are comfortable and fit you well. Are closed at the toe. Do not wear sandals. If you use a stepladder: Make sure that it is fully opened. Do not climb a closed stepladder. Make sure that both sides of the stepladder are locked into place. Ask someone to hold it for you, if possible. Clearly mark and make sure that you can see: Any grab bars or handrails. First and last steps. Where the edge of each step is. Use tools that help you move around (mobility aids) if they are needed. These  include: Canes. Walkers. Scooters. Crutches. Turn on the lights when you go into a dark area. Replace any light bulbs as soon as they burn out. Set up your furniture so you have a clear path. Avoid moving your furniture around. If any of your floors are uneven, fix them. If there are any pets around you, be aware of where they are. Review your medicines with your doctor. Some medicines can make you feel dizzy. This can increase your chance of falling. Ask your doctor what other things that you can do to help prevent falls. This information is not intended to replace advice given to you by your health care provider. Make sure you discuss any questions you have with your health care provider. Document Released: 10/17/2008 Document Revised: 05/29/2015 Document Reviewed: 01/25/2014 Elsevier Interactive Patient Education  2017 [employer].

## 2020-12-17 ENCOUNTER — Telehealth: Payer: Self-pay | Admitting: *Deleted

## 2020-12-17 NOTE — Chronic Care Management (AMB) (Signed)
Chronic Care Management   Note  12/17/2020 Name: Shadow Schedler MRN: [id number] DOB: October 09, 1965  Burris Matherne is a 55 y.o. year old male who is a primary care patient of Coral Spikes, DO. I reached out to Clare Charon by phone today in response to a referral sent by Mr. Nash Bolls PCP.  Mr. Slape was given information about Chronic Care Management services today including:  CCM service includes personalized support from designated clinical staff supervised by his physician, including individualized plan of care and coordination with other care providers 24/7 contact phone numbers for assistance for urgent and routine care needs. Service will only be billed when office clinical staff spend 20 minutes or more in a month to coordinate care. Only one practitioner may furnish and bill the service in a calendar month. The patient may stop CCM services at any time (effective at the end of the month) by phone call to the office staff. The patient is responsible for co-pay (up to 20% after annual deductible is met) if co-pay is required by the individual health plan.   Patient agreed to services and verbal consent obtained.   Follow up plan: Telephone appointment with care management team member scheduled for:12/26/20  [place]: [phone]

## 2020-12-26 ENCOUNTER — Ambulatory Visit (INDEPENDENT_AMBULATORY_CARE_PROVIDER_SITE_OTHER): Payer: Medicare Other | Admitting: Pharmacist

## 2020-12-26 DIAGNOSIS — E118 Type 2 diabetes mellitus with unspecified complications: Secondary | ICD-10-CM

## 2020-12-26 DIAGNOSIS — E785 Hyperlipidemia, unspecified: Secondary | ICD-10-CM

## 2020-12-26 DIAGNOSIS — I10 Essential (primary) hypertension: Secondary | ICD-10-CM

## 2020-12-26 NOTE — Patient Instructions (Signed)
Clare Charon,  It was great to talk to you today!  Please call me with any questions or concerns.   Visit Information   Following is a copy of your full care plan:  Care Plan : Medication Management  Updates made by Beryle Lathe, [place] since 12/26/2020 12:00 AM  Completed 12/26/2020   Problem: T2DM, HTN, HLD Resolved 12/26/2020  Priority: High  Onset Date: 12/26/2020     Long-Range Goal: Disease Progression Prevention Completed 12/26/2020  Start Date: 12/26/2020  Expected End Date: 01/25/2021  This Visit's Progress: On track  Priority: High  Note:   Current Barriers:  Unable to independently monitor therapeutic efficacy Unable to achieve control of hypertension and hyperlipidemia  Pharmacist Clinical Goal(s):  Through collaboration with PharmD and provider, patient will  Achieve adherence to monitoring guidelines and medication adherence to achieve therapeutic efficacy Achieve control of hypertension and hyperlipidemia as evidenced by improved blood pressure control and improved triglycerides   Interventions: 1:1 collaboration with Coral Spikes, DO regarding development and update of comprehensive plan of care as evidenced by provider attestation and co-signature Inter-disciplinary care team collaboration (see longitudinal plan of care) Comprehensive medication review performed; medication list updated in electronic medical record  Type 2 Diabetes - New goal.: Controlled; Most recent A1c at goal of <7% per ADA guidelines Current medications: Novolin N 10-25 units subcutaneously at bedtime Intolerances: none Taking medications as directed:  patient's wife was/is a paramedic so she gives him his insulin on a sliding scale essentially according to his blood glucose levels Side effects thought to be attributed to current medication regimen: no Current meal patterns: not discussed today Current exercise: not discussed today On a statin: yes On aspirin 81 mg daily:  no Current glucose readings:  not discussed today Continue Novolin N 10-25 units subcutaneously at bedtime for now; however, would recommend alternative insulin regimen since he is likely not getting full 24 hours of insulin coverage with NPH.  Discussed options with patient and wife. He is currently paying $25 for over the counter insulin NPH but with his insurance, he should be able to get a once daily basal insulin for ~$35 which would give him more even distribution of insulin throughout the day. I would recommend [place] or Tou[place]o pens with pen tips. Since he is taking a wide range of insulin NPH, it is difficult to convert easily but would consider starting around 14-16 units (~0.2 units/kg) once daily and can increase from there if further blood glucose control is needed.  Patient identified as a good candidate for continuous glucose monitor. Recommend Dexcom G6 or Freestyle Libre 2. Prescription would need to be sent to DME supplier.  Hypertension - New goal.: Blood pressure under fair control. Blood pressure is above goal of <130/80 mmHg per 2017 AHA/ACC guidelines. Current medications:  none Intolerances:  hypotension during HD sessions when previously taking blood pressure medication Taking medications as directed: n/a Side effects thought to be attributed to current medication regimen: n/a Current home blood pressure: not discussed today Blood pressure medications not indicated at present Encourage dietary sodium restriction/DASH diet Continue to monitor blood pressure closely  Hyperlipidemia - New goal.: Uncontrolled. LDL at goal of <70 due to very high risk given diabetes + at least 1 additional major risk factor (hypertension and chronic kidney disease (CKD)) per 2020 AACE/ACE guidelines. Triglycerides above goal of <150 per 2020 AACE/ACE guidelines. Current medications: atorvastatin 20 mg by mouth once daily and fenofibrate 145 mg by mouth once daily Patient  was unable to afford  Vascepa 2g twice daily Intolerances: none Taking medications as directed: yes Side effects thought to be attributed to current medication regimen: no Continue atorvastatin 20 mg by mouth once daily and fenofibrate 145 mg by mouth once daily Encourage dietary reduction of high fat containing foods such as butter, nuts, bacon, egg yolks, etc. Re-check lipid panel in 4-12 weeks Patient instructed to consider over the counter fish oil 1-2 capsules by mouth twice daily  Patient Goals/Self-Care Activities Patient will:  Take medications as prescribed Collaborate with provider on medication access solutions  Follow Up Plan: No further follow up required: will sign off      Consent to CCM Services: Mr. Chermak was given information about Chronic Care Management services including:  CCM service includes personalized support from designated clinical staff supervised by his physician, including individualized plan of care and coordination with other care providers 24/7 contact phone numbers for assistance for urgent and routine care needs. Service will only be billed when office clinical staff spend 20 minutes or more in a month to coordinate care. Only one practitioner may furnish and bill the service in a calendar month. The patient may stop CCM services at any time (effective at the end of the month) by phone call to the office staff. The patient will be responsible for cost sharing (co-pay) of up to 20% of the service fee (after annual deductible is met).  Patient agreed to services and verbal consent obtained.   Plan: No further follow up required: will sign off  Kennon Holter, PharmD, Para March, CPP Clinical Pharmacist Practitioner [place] [phone]  Please call the care guide team at [phone] if you need to cancel or reschedule your appointment.   Patient verbalizes understanding of instructions provided today and agrees to view in [place].

## 2020-12-26 NOTE — Chronic Care Management (AMB) (Signed)
Chronic Care Management Pharmacy Note  12/26/2020 Name:  Paul Gonzalez MRN:  [id number] DOB:  1965-11-21  Summary: No further follow up requested by patient. Will sign off. Please send new referral if patient wishes to reconsider chronic care management.   Type 2 Diabetes Controlled; Most recent A1c at goal of <7% per ADA guidelines Current medications: Novolin N 10-25 units subcutaneously at bedtime Taking medications as directed:  patient's wife was/is a paramedic so she gives him his insulin on a sliding scale essentially according to his blood glucose levels Continue Novolin N 10-25 units subcutaneously at bedtime for now; however, would recommend alternative insulin regimen since he is likely not getting full 24 hours of insulin coverage with NPH.  Discussed options with patient and wife. He is currently paying $25 for over the counter insulin NPH but with his insurance, he should be able to get a once daily basal insulin for ~$35 which would give him more even distribution of insulin throughout the day. I would recommend [place] or [place] pens with pen tips. Since he is taking a wide range of insulin NPH, it is difficult to convert easily but would consider starting around 14-16 units (~0.2 units/kg) once daily and can increase from there if further blood glucose control is needed.  Patient identified as a good candidate for continuous glucose monitor. Recommend Dexcom G6 or Freestyle Libre 2. Prescription would need to be sent to DME supplier.  Subjective: Paul Gonzalez is an 55 y.o. year old male who is a primary patient of Coral Spikes, DO.  The CCM team was consulted for assistance with disease management and care coordination needs.    Engaged with patient and wife Paul Gonzalez) by telephone for initial visit in response to provider referral for pharmacy case management and/or care coordination services.   Consent to Services:  The patient was given the following information  about Chronic Care Management services today, agreed to services, and gave verbal consent: 1. CCM service includes personalized support from designated clinical staff supervised by the primary care provider, including individualized plan of care and coordination with other care providers 2. 24/7 contact phone numbers for assistance for urgent and routine care needs. 3. Service will only be billed when office clinical staff spend 20 minutes or more in a month to coordinate care. 4. Only one practitioner may furnish and bill the service in a calendar month. 5.The patient may stop CCM services at any time (effective at the end of the month) by phone call to the office staff. 6. The patient will be responsible for cost sharing (co-pay) of up to 20% of the service fee (after annual deductible is met). Patient agreed to services and consent obtained.  Patient Care Team: Coral Spikes, DO as PCP - General (Family Medicine) Wolfgang Phoenix Grace Bushy, MD (Inactive) (Family Medicine) Liana Gerold, MD as Consulting Physician (Nephrology)  Objective:  Lab Results  Component Value Date   CREATININE 4.60 (H) 06/28/2017   CREATININE 3.22 (H) 06/10/2016   CREATININE 5.80 (H) 09/23/2014    Lab Results  Component Value Date   HGBA1C 6.4 10/24/2017   Last diabetic Eye exam:  Lab Results  Component Value Date/Time   HMDIABEYEEXA Retinopathy (A) 11/18/2015 12:00 AM    Last diabetic Foot exam: No results found for: HMDIABFOOTEX      Component Value Date/Time   CHOL 132 12/04/2020 1119   TRIG 182 (H) 12/04/2020 1119   HDL 36 (L) 12/04/2020 1119   CHOLHDL  3.7 12/04/2020 1119   CHOLHDL 3.8 02/14/2014 0923   VLDL 17 02/14/2014 0923   LDLCALC 65 12/04/2020 1119    Hepatic Function Latest Ref Rng & Units 10/27/2017 06/10/2016 01/01/2014  Total Protein 6.0 - 8.5 g/dL 7.1 6.7 7.2  Albumin 3.5 - 5.5 g/dL 4.4 4.3 4.1  AST 0 - 40 IU/L _0 ALT 0 - 44 IU/L _1 Alk Phosphatase 39 - 117 IU/L 195(H) 94  91  Total Bilirubin 0.0 - 1.2 mg/dL 0.2 0.3 0.3  Bilirubin, Direct 0.00 - 0.40 mg/dL 0.07 0.06 -    No results found for: TSH, FREET4  CBC Latest Ref Rng & Units 06/28/2017 09/23/2014 04/24/2014  WBC 4.0 - 10.5 K/uL - - 6.1  Hemoglobin 13.0 - 17.0 g/dL 11.9(L) 12.6(L) 9.0(L)  Hematocrit 39.0 - 52.0 % 35.0(L) 37.0(L) 26.7(L)  Platelets 150 - 400 K/uL - - 186    No results found for: VD25OH  Clinical ASCVD: No  The 10-year ASCVD risk score (Arnett DK, et al., 2019) is: 11.2%   Values used to calculate the score:     Age: 29 years     Sex: Male     Is Non-Hispanic African American: No     Diabetic: Yes     Tobacco smoker: No     Systolic Blood Pressure: 130 mmHg     Is BP treated: No     HDL Cholesterol: 36 mg/dL     Total Cholesterol: 132 mg/dL    Social History   Tobacco Use  Smoking Status Never  Smokeless Tobacco Never   BP Readings from Last 3 Encounters:  12/13/20 (!) 148/66  12/04/20 (!) 173/106  03/27/20 (!) 159/80   Pulse Readings from Last 3 Encounters:  12/13/20 80  12/04/20 72  08/30/19 74   Wt Readings from Last 3 Encounters:  12/16/20 176 lb (79.8 kg)  12/04/20 176 lb (79.8 kg)  03/27/20 179 lb 6.4 oz (81.4 kg)    Assessment: Review of patient past medical history, allergies, medications, health status, including review of consultants reports, laboratory and other test data, was performed as part of comprehensive evaluation and provision of chronic care management services.   SDOH:  (Social Determinants of Health) assessments and interventions performed:    CCM Care Plan  No Known Allergies  Medications Reviewed Today     Reviewed by Beryle Lathe, [facility] (Pharmacist) on 12/26/20 at [place] List Status: <None>   Medication Order Taking? Sig Documenting Provider Last Dose Status Informant  atorvastatin (LIPITOR) 20 MG tablet [id number] Yes Take 1 tablet (20 mg total) by mouth daily. Coral Spikes, DO Taking Active   calcium acetate  (PHOSLO) 667 MG capsule [id number] Yes Take 667 mg by mouth 3 (three) times daily with meals. [provider] Taking Active   citalopram (CELEXA) 40 MG tablet [id number] Yes Take 1 tablet (40 mg total) by mouth daily. Kathyrn Drown, MD Taking Active   fenofibrate (TRICOR) 145 MG tablet [id number] Yes Take 1 tablet (145 mg total) by mouth daily. Thersa Salt G, DO Taking Active   insulin NPH Human (NOVOLIN N) 100 UNIT/ML injection [id number] Yes 10 units qhs  Patient taking differently: Inject 10-25 Units into the skin at bedtime.   Mikey Kirschner, MD Taking Active   Insulin Syringe-Needle U-100 (BD VEO INSULIN SYRINGE U/F) 31G X 15/64" 0.3 ML MISC [id number]  USE AS DIRECTED Lovena Le, Malena M, DO  Active   loratadine (CLARITIN)  10 MG tablet [id number] Yes Take 1 tablet (10 mg total) by mouth daily. Kathyrn Drown, MD Taking Active   Multiple Vitamin (MULTIVITAMIN) tablet [id number] Yes Take 1 tablet by mouth daily. [provider] Taking Active   sevelamer carbonate (RENVELA) 800 MG tablet [id number] Yes Take 1,600 mg by mouth 3 (three) times daily with meals. [provider] Taking Active             Patient Active Problem List   Diagnosis Date Noted   ESRD (end stage renal disease) on dialysis ([place]) 12/04/2020   Legally blind 12/04/2020   Diabetic retinopathy ([place]) 40/97/3532   Metabolic disorder 99/24/2683   Osteopathy in diseases classified elsewhere, unspecified site 11/19/2014   Chronic kidney disease 06/23/2014   Depression 01/21/2014   Anemia in CKD (chronic kidney disease) 12/11/2013   Diabetic neuropathy, type II diabetes mellitus ([place]) 04/10/2013   HTN (hypertension), benign 01/05/2013   Type 2 diabetes mellitus with complications ([place]) 41/96/2229   Hyperlipidemia 01/05/2013    Immunization History  Administered Date(s) Administered   Fluad Quad(high Dose 65+) 09/12/2014   Influenza Inj Mdck Quad Pf 09/24/2015, 10/22/2016, 10/24/2017, 10/13/2018,  10/19/2019   Influenza Split 01/05/2013   Influenza,inj,Quad PF,6+ Mos 10/19/2019, 10/23/2020   Influenza-Unspecified 10/22/2014, 10/25/2015, 10/24/2017, 11/04/2020   Moderna Sars-Covid-2 Vaccination 03/22/2019, 04/19/2019   PPD Test 04/24/2014, 02/28/2015, 10/11/2016, 11/07/2017, 12/18/2018, 12/19/2019, 11/24/2020   Pneumococcal Polysaccharide-23 01/10/2013, 09/06/2018    Conditions to be addressed/monitored: HTN, HLD, and DMII  Care Plan : Medication Management  Updates made by Beryle Lathe, [place] since 12/26/2020 12:00 AM  Completed 12/26/2020   Problem: T2DM, HTN, HLD Resolved 12/26/2020  Priority: High  Onset Date: 12/26/2020     Long-Range Goal: Disease Progression Prevention Completed 12/26/2020  Start Date: 12/26/2020  Expected End Date: 01/25/2021  This Visit's Progress: On track  Priority: High  Note:   Current Barriers:  Unable to independently monitor therapeutic efficacy Unable to achieve control of hypertension and hyperlipidemia  Pharmacist Clinical Goal(s):  Through collaboration with PharmD and provider, patient will  Achieve adherence to monitoring guidelines and medication adherence to achieve therapeutic efficacy Achieve control of hypertension and hyperlipidemia as evidenced by improved blood pressure control and improved triglycerides   Interventions: 1:1 collaboration with Coral Spikes, DO regarding development and update of comprehensive plan of care as evidenced by provider attestation and co-signature Inter-disciplinary care team collaboration (see longitudinal plan of care) Comprehensive medication review performed; medication list updated in electronic medical record  Type 2 Diabetes - New goal.: Controlled; Most recent A1c at goal of <7% per ADA guidelines Current medications: Novolin N 10-25 units subcutaneously at bedtime Intolerances: none Taking medications as directed:  patient's wife was/is a paramedic so she gives him his  insulin on a sliding scale essentially according to his blood glucose levels Side effects thought to be attributed to current medication regimen: no Current meal patterns: not discussed today Current exercise: not discussed today On a statin: yes On aspirin 81 mg daily: no Current glucose readings:  not discussed today Continue Novolin N 10-25 units subcutaneously at bedtime for now; however, would recommend alternative insulin regimen since he is likely not getting full 24 hours of insulin coverage with NPH.  Discussed options with patient and wife. He is currently paying $25 for over the counter insulin NPH but with his insurance, he should be able to get a once daily basal insulin for ~$35 which would give him more even distribution of insulin throughout  the day. I would recommend [place] or [place] pens with pen tips. Since he is taking a wide range of insulin NPH, it is difficult to convert easily but would consider starting around 14-16 units (~0.2 units/kg) once daily and can increase from there if further blood glucose control is needed.  Patient identified as a good candidate for continuous glucose monitor. Recommend Dexcom G6 or Freestyle Libre 2. Prescription would need to be sent to DME supplier.  Hypertension - New goal.: Blood pressure under fair control. Blood pressure is above goal of <130/80 mmHg per 2017 AHA/ACC guidelines. Current medications:  none Intolerances:  hypotension during HD sessions when previously taking blood pressure medication Taking medications as directed: n/a Side effects thought to be attributed to current medication regimen: n/a Current home blood pressure: not discussed today Blood pressure medications not indicated at present Encourage dietary sodium restriction/DASH diet Continue to monitor blood pressure closely  Hyperlipidemia - New goal.: Uncontrolled. LDL at goal of <70 due to very high risk given diabetes + at least 1 additional major risk factor  (hypertension and chronic kidney disease (CKD)) per 2020 AACE/ACE guidelines. Triglycerides above goal of <150 per 2020 AACE/ACE guidelines. Current medications: atorvastatin 20 mg by mouth once daily and fenofibrate 145 mg by mouth once daily Patient was unable to afford Vascepa 2g twice daily Intolerances: none Taking medications as directed: yes Side effects thought to be attributed to current medication regimen: no Continue atorvastatin 20 mg by mouth once daily and fenofibrate 145 mg by mouth once daily Encourage dietary reduction of high fat containing foods such as butter, nuts, bacon, egg yolks, etc. Re-check lipid panel in 4-12 weeks Patient instructed to consider over the counter fish oil 1-2 capsules by mouth twice daily  Patient Goals/Self-Care Activities Patient will:  Take medications as prescribed Collaborate with provider on medication access solutions  Follow Up Plan: No further follow up required: will sign off      Medication Assistance: None required.  Patient affirms current coverage meets needs. Patient's wife states they have tried to get patient assistance in the past but they have been denied since they make too much money.   Patient's preferred pharmacy is:  [facility] [address] [place] - [place]  [address] [place] [address] Phone: [phone] Fax: [phone]  [place] [place] [address] Phone: [phone] Fax: [phone]  Follow Up:  Patient requests no follow-up at this time.  Plan: No further follow up required: will sign off  Kennon Holter, PharmD, Para [place], CPP Clinical Pharmacist Practitioner [place] [phone]

## 2021-01-03 DIAGNOSIS — I10 Essential (primary) hypertension: Secondary | ICD-10-CM | POA: Diagnosis not present

## 2021-01-03 DIAGNOSIS — Z794 Long term (current) use of insulin: Secondary | ICD-10-CM

## 2021-01-03 DIAGNOSIS — Z992 Dependence on renal dialysis: Secondary | ICD-10-CM | POA: Diagnosis not present

## 2021-01-03 DIAGNOSIS — E1169 Type 2 diabetes mellitus with other specified complication: Secondary | ICD-10-CM

## 2021-01-03 DIAGNOSIS — E785 Hyperlipidemia, unspecified: Secondary | ICD-10-CM

## 2021-01-03 DIAGNOSIS — N186 End stage renal disease: Secondary | ICD-10-CM | POA: Diagnosis not present

## 2021-01-05 DIAGNOSIS — D631 Anemia in chronic kidney disease: Secondary | ICD-10-CM | POA: Diagnosis not present

## 2021-01-05 DIAGNOSIS — Z992 Dependence on renal dialysis: Secondary | ICD-10-CM | POA: Diagnosis not present

## 2021-01-05 DIAGNOSIS — D509 Iron deficiency anemia, unspecified: Secondary | ICD-10-CM | POA: Diagnosis not present

## 2021-01-05 DIAGNOSIS — N186 End stage renal disease: Secondary | ICD-10-CM | POA: Diagnosis not present

## 2021-01-05 DIAGNOSIS — N2581 Secondary hyperparathyroidism of renal origin: Secondary | ICD-10-CM | POA: Diagnosis not present

## 2021-01-07 DIAGNOSIS — N2581 Secondary hyperparathyroidism of renal origin: Secondary | ICD-10-CM | POA: Diagnosis not present

## 2021-01-07 DIAGNOSIS — D631 Anemia in chronic kidney disease: Secondary | ICD-10-CM | POA: Diagnosis not present

## 2021-01-07 DIAGNOSIS — D509 Iron deficiency anemia, unspecified: Secondary | ICD-10-CM | POA: Diagnosis not present

## 2021-01-07 DIAGNOSIS — N186 End stage renal disease: Secondary | ICD-10-CM | POA: Diagnosis not present

## 2021-01-07 DIAGNOSIS — Z992 Dependence on renal dialysis: Secondary | ICD-10-CM | POA: Diagnosis not present

## 2021-01-09 DIAGNOSIS — N2581 Secondary hyperparathyroidism of renal origin: Secondary | ICD-10-CM | POA: Diagnosis not present

## 2021-01-09 DIAGNOSIS — D631 Anemia in chronic kidney disease: Secondary | ICD-10-CM | POA: Diagnosis not present

## 2021-01-09 DIAGNOSIS — Z992 Dependence on renal dialysis: Secondary | ICD-10-CM | POA: Diagnosis not present

## 2021-01-09 DIAGNOSIS — D509 Iron deficiency anemia, unspecified: Secondary | ICD-10-CM | POA: Diagnosis not present

## 2021-01-09 DIAGNOSIS — N186 End stage renal disease: Secondary | ICD-10-CM | POA: Diagnosis not present

## 2021-01-12 DIAGNOSIS — N2581 Secondary hyperparathyroidism of renal origin: Secondary | ICD-10-CM | POA: Diagnosis not present

## 2021-01-12 DIAGNOSIS — D509 Iron deficiency anemia, unspecified: Secondary | ICD-10-CM | POA: Diagnosis not present

## 2021-01-12 DIAGNOSIS — E119 Type 2 diabetes mellitus without complications: Secondary | ICD-10-CM | POA: Diagnosis not present

## 2021-01-12 DIAGNOSIS — D631 Anemia in chronic kidney disease: Secondary | ICD-10-CM | POA: Diagnosis not present

## 2021-01-12 DIAGNOSIS — N186 End stage renal disease: Secondary | ICD-10-CM | POA: Diagnosis not present

## 2021-01-12 DIAGNOSIS — Z992 Dependence on renal dialysis: Secondary | ICD-10-CM | POA: Diagnosis not present

## 2021-01-14 ENCOUNTER — Telehealth: Payer: Self-pay

## 2021-01-14 DIAGNOSIS — N186 End stage renal disease: Secondary | ICD-10-CM | POA: Diagnosis not present

## 2021-01-14 DIAGNOSIS — Z992 Dependence on renal dialysis: Secondary | ICD-10-CM | POA: Diagnosis not present

## 2021-01-14 DIAGNOSIS — D509 Iron deficiency anemia, unspecified: Secondary | ICD-10-CM | POA: Diagnosis not present

## 2021-01-14 DIAGNOSIS — D631 Anemia in chronic kidney disease: Secondary | ICD-10-CM | POA: Diagnosis not present

## 2021-01-14 DIAGNOSIS — N2581 Secondary hyperparathyroidism of renal origin: Secondary | ICD-10-CM | POA: Diagnosis not present

## 2021-01-14 NOTE — Telephone Encounter (Signed)
° °  Telephone encounter was:  Successful.  01/14/2021 Name: Mohamed Portlock MRN: [id number] DOB: [birth year]  Absalom Aro is a 56 y.o. year old male who is a primary care patient of Coral Spikes, DO . The community resource team was consulted for assistance with [place] guide performed the following interventions: Spoke with patient's spouse Gregary Signs patient has applied for [employer] but was told he made $26 too much to qualify.  He has also applied for disability for was denied.  They are currently able to get food from [place] and [facility].  Gave information for the [facility].  Follow Up Plan:  No further follow up planned at this time. The patient has been provided with needed resources.  Ragna Kramlich, AAS Paralegal, [facility]  [address], [place] [address] ??[email or web address]   ?? [phone]   [email or web address]

## 2021-01-16 DIAGNOSIS — N2581 Secondary hyperparathyroidism of renal origin: Secondary | ICD-10-CM | POA: Diagnosis not present

## 2021-01-16 DIAGNOSIS — Z992 Dependence on renal dialysis: Secondary | ICD-10-CM | POA: Diagnosis not present

## 2021-01-16 DIAGNOSIS — N186 End stage renal disease: Secondary | ICD-10-CM | POA: Diagnosis not present

## 2021-01-16 DIAGNOSIS — D631 Anemia in chronic kidney disease: Secondary | ICD-10-CM | POA: Diagnosis not present

## 2021-01-16 DIAGNOSIS — D509 Iron deficiency anemia, unspecified: Secondary | ICD-10-CM | POA: Diagnosis not present

## 2021-01-19 DIAGNOSIS — N2581 Secondary hyperparathyroidism of renal origin: Secondary | ICD-10-CM | POA: Diagnosis not present

## 2021-01-19 DIAGNOSIS — N186 End stage renal disease: Secondary | ICD-10-CM | POA: Diagnosis not present

## 2021-01-19 DIAGNOSIS — D509 Iron deficiency anemia, unspecified: Secondary | ICD-10-CM | POA: Diagnosis not present

## 2021-01-19 DIAGNOSIS — D631 Anemia in chronic kidney disease: Secondary | ICD-10-CM | POA: Diagnosis not present

## 2021-01-19 DIAGNOSIS — Z992 Dependence on renal dialysis: Secondary | ICD-10-CM | POA: Diagnosis not present

## 2021-01-21 DIAGNOSIS — Z992 Dependence on renal dialysis: Secondary | ICD-10-CM | POA: Diagnosis not present

## 2021-01-21 DIAGNOSIS — D631 Anemia in chronic kidney disease: Secondary | ICD-10-CM | POA: Diagnosis not present

## 2021-01-21 DIAGNOSIS — D509 Iron deficiency anemia, unspecified: Secondary | ICD-10-CM | POA: Diagnosis not present

## 2021-01-21 DIAGNOSIS — N2581 Secondary hyperparathyroidism of renal origin: Secondary | ICD-10-CM | POA: Diagnosis not present

## 2021-01-21 DIAGNOSIS — N186 End stage renal disease: Secondary | ICD-10-CM | POA: Diagnosis not present

## 2021-01-23 DIAGNOSIS — D509 Iron deficiency anemia, unspecified: Secondary | ICD-10-CM | POA: Diagnosis not present

## 2021-01-23 DIAGNOSIS — D631 Anemia in chronic kidney disease: Secondary | ICD-10-CM | POA: Diagnosis not present

## 2021-01-23 DIAGNOSIS — Z992 Dependence on renal dialysis: Secondary | ICD-10-CM | POA: Diagnosis not present

## 2021-01-23 DIAGNOSIS — N2581 Secondary hyperparathyroidism of renal origin: Secondary | ICD-10-CM | POA: Diagnosis not present

## 2021-01-23 DIAGNOSIS — N186 End stage renal disease: Secondary | ICD-10-CM | POA: Diagnosis not present

## 2021-01-26 DIAGNOSIS — N186 End stage renal disease: Secondary | ICD-10-CM | POA: Diagnosis not present

## 2021-01-26 DIAGNOSIS — N2581 Secondary hyperparathyroidism of renal origin: Secondary | ICD-10-CM | POA: Diagnosis not present

## 2021-01-26 DIAGNOSIS — Z992 Dependence on renal dialysis: Secondary | ICD-10-CM | POA: Diagnosis not present

## 2021-01-26 DIAGNOSIS — D631 Anemia in chronic kidney disease: Secondary | ICD-10-CM | POA: Diagnosis not present

## 2021-01-26 DIAGNOSIS — D509 Iron deficiency anemia, unspecified: Secondary | ICD-10-CM | POA: Diagnosis not present

## 2021-01-28 DIAGNOSIS — D509 Iron deficiency anemia, unspecified: Secondary | ICD-10-CM | POA: Diagnosis not present

## 2021-01-28 DIAGNOSIS — N186 End stage renal disease: Secondary | ICD-10-CM | POA: Diagnosis not present

## 2021-01-28 DIAGNOSIS — Z992 Dependence on renal dialysis: Secondary | ICD-10-CM | POA: Diagnosis not present

## 2021-01-28 DIAGNOSIS — D631 Anemia in chronic kidney disease: Secondary | ICD-10-CM | POA: Diagnosis not present

## 2021-01-28 DIAGNOSIS — N2581 Secondary hyperparathyroidism of renal origin: Secondary | ICD-10-CM | POA: Diagnosis not present

## 2021-01-30 DIAGNOSIS — N2581 Secondary hyperparathyroidism of renal origin: Secondary | ICD-10-CM | POA: Diagnosis not present

## 2021-01-30 DIAGNOSIS — N186 End stage renal disease: Secondary | ICD-10-CM | POA: Diagnosis not present

## 2021-01-30 DIAGNOSIS — D631 Anemia in chronic kidney disease: Secondary | ICD-10-CM | POA: Diagnosis not present

## 2021-01-30 DIAGNOSIS — Z992 Dependence on renal dialysis: Secondary | ICD-10-CM | POA: Diagnosis not present

## 2021-01-30 DIAGNOSIS — D509 Iron deficiency anemia, unspecified: Secondary | ICD-10-CM | POA: Diagnosis not present

## 2021-02-02 DIAGNOSIS — N186 End stage renal disease: Secondary | ICD-10-CM | POA: Diagnosis not present

## 2021-02-02 DIAGNOSIS — N2581 Secondary hyperparathyroidism of renal origin: Secondary | ICD-10-CM | POA: Diagnosis not present

## 2021-02-02 DIAGNOSIS — Z992 Dependence on renal dialysis: Secondary | ICD-10-CM | POA: Diagnosis not present

## 2021-02-02 DIAGNOSIS — D631 Anemia in chronic kidney disease: Secondary | ICD-10-CM | POA: Diagnosis not present

## 2021-02-02 DIAGNOSIS — D509 Iron deficiency anemia, unspecified: Secondary | ICD-10-CM | POA: Diagnosis not present

## 2021-02-03 DIAGNOSIS — N186 End stage renal disease: Secondary | ICD-10-CM | POA: Diagnosis not present

## 2021-02-03 DIAGNOSIS — Z992 Dependence on renal dialysis: Secondary | ICD-10-CM | POA: Diagnosis not present

## 2021-02-04 DIAGNOSIS — N186 End stage renal disease: Secondary | ICD-10-CM | POA: Diagnosis not present

## 2021-02-04 DIAGNOSIS — Z992 Dependence on renal dialysis: Secondary | ICD-10-CM | POA: Diagnosis not present

## 2021-02-04 DIAGNOSIS — N2581 Secondary hyperparathyroidism of renal origin: Secondary | ICD-10-CM | POA: Diagnosis not present

## 2021-02-04 DIAGNOSIS — D509 Iron deficiency anemia, unspecified: Secondary | ICD-10-CM | POA: Diagnosis not present

## 2021-02-04 DIAGNOSIS — D631 Anemia in chronic kidney disease: Secondary | ICD-10-CM | POA: Diagnosis not present

## 2021-02-06 DIAGNOSIS — N2581 Secondary hyperparathyroidism of renal origin: Secondary | ICD-10-CM | POA: Diagnosis not present

## 2021-02-06 DIAGNOSIS — D631 Anemia in chronic kidney disease: Secondary | ICD-10-CM | POA: Diagnosis not present

## 2021-02-06 DIAGNOSIS — Z992 Dependence on renal dialysis: Secondary | ICD-10-CM | POA: Diagnosis not present

## 2021-02-06 DIAGNOSIS — D509 Iron deficiency anemia, unspecified: Secondary | ICD-10-CM | POA: Diagnosis not present

## 2021-02-06 DIAGNOSIS — N186 End stage renal disease: Secondary | ICD-10-CM | POA: Diagnosis not present

## 2021-02-09 DIAGNOSIS — N2581 Secondary hyperparathyroidism of renal origin: Secondary | ICD-10-CM | POA: Diagnosis not present

## 2021-02-09 DIAGNOSIS — D509 Iron deficiency anemia, unspecified: Secondary | ICD-10-CM | POA: Diagnosis not present

## 2021-02-09 DIAGNOSIS — D631 Anemia in chronic kidney disease: Secondary | ICD-10-CM | POA: Diagnosis not present

## 2021-02-09 DIAGNOSIS — Z992 Dependence on renal dialysis: Secondary | ICD-10-CM | POA: Diagnosis not present

## 2021-02-09 DIAGNOSIS — N186 End stage renal disease: Secondary | ICD-10-CM | POA: Diagnosis not present

## 2021-02-11 ENCOUNTER — Other Ambulatory Visit: Payer: Self-pay | Admitting: Family Medicine

## 2021-02-11 DIAGNOSIS — N186 End stage renal disease: Secondary | ICD-10-CM | POA: Diagnosis not present

## 2021-02-11 DIAGNOSIS — D631 Anemia in chronic kidney disease: Secondary | ICD-10-CM | POA: Diagnosis not present

## 2021-02-11 DIAGNOSIS — N2581 Secondary hyperparathyroidism of renal origin: Secondary | ICD-10-CM | POA: Diagnosis not present

## 2021-02-11 DIAGNOSIS — D509 Iron deficiency anemia, unspecified: Secondary | ICD-10-CM | POA: Diagnosis not present

## 2021-02-11 DIAGNOSIS — Z992 Dependence on renal dialysis: Secondary | ICD-10-CM | POA: Diagnosis not present

## 2021-02-12 DIAGNOSIS — N2581 Secondary hyperparathyroidism of renal origin: Secondary | ICD-10-CM | POA: Diagnosis not present

## 2021-02-12 DIAGNOSIS — D509 Iron deficiency anemia, unspecified: Secondary | ICD-10-CM | POA: Diagnosis not present

## 2021-02-12 DIAGNOSIS — Z992 Dependence on renal dialysis: Secondary | ICD-10-CM | POA: Diagnosis not present

## 2021-02-12 DIAGNOSIS — N186 End stage renal disease: Secondary | ICD-10-CM | POA: Diagnosis not present

## 2021-02-12 DIAGNOSIS — D631 Anemia in chronic kidney disease: Secondary | ICD-10-CM | POA: Diagnosis not present

## 2021-02-13 DIAGNOSIS — N2581 Secondary hyperparathyroidism of renal origin: Secondary | ICD-10-CM | POA: Diagnosis not present

## 2021-02-13 DIAGNOSIS — N186 End stage renal disease: Secondary | ICD-10-CM | POA: Diagnosis not present

## 2021-02-13 DIAGNOSIS — D509 Iron deficiency anemia, unspecified: Secondary | ICD-10-CM | POA: Diagnosis not present

## 2021-02-13 DIAGNOSIS — Z992 Dependence on renal dialysis: Secondary | ICD-10-CM | POA: Diagnosis not present

## 2021-02-13 DIAGNOSIS — D631 Anemia in chronic kidney disease: Secondary | ICD-10-CM | POA: Diagnosis not present

## 2021-02-16 DIAGNOSIS — N186 End stage renal disease: Secondary | ICD-10-CM | POA: Diagnosis not present

## 2021-02-16 DIAGNOSIS — N2581 Secondary hyperparathyroidism of renal origin: Secondary | ICD-10-CM | POA: Diagnosis not present

## 2021-02-16 DIAGNOSIS — Z992 Dependence on renal dialysis: Secondary | ICD-10-CM | POA: Diagnosis not present

## 2021-02-16 DIAGNOSIS — D509 Iron deficiency anemia, unspecified: Secondary | ICD-10-CM | POA: Diagnosis not present

## 2021-02-16 DIAGNOSIS — D631 Anemia in chronic kidney disease: Secondary | ICD-10-CM | POA: Diagnosis not present

## 2021-02-18 DIAGNOSIS — N2581 Secondary hyperparathyroidism of renal origin: Secondary | ICD-10-CM | POA: Diagnosis not present

## 2021-02-18 DIAGNOSIS — N186 End stage renal disease: Secondary | ICD-10-CM | POA: Diagnosis not present

## 2021-02-18 DIAGNOSIS — D631 Anemia in chronic kidney disease: Secondary | ICD-10-CM | POA: Diagnosis not present

## 2021-02-18 DIAGNOSIS — Z992 Dependence on renal dialysis: Secondary | ICD-10-CM | POA: Diagnosis not present

## 2021-02-18 DIAGNOSIS — D509 Iron deficiency anemia, unspecified: Secondary | ICD-10-CM | POA: Diagnosis not present

## 2021-02-20 DIAGNOSIS — N2581 Secondary hyperparathyroidism of renal origin: Secondary | ICD-10-CM | POA: Diagnosis not present

## 2021-02-20 DIAGNOSIS — N186 End stage renal disease: Secondary | ICD-10-CM | POA: Diagnosis not present

## 2021-02-20 DIAGNOSIS — D509 Iron deficiency anemia, unspecified: Secondary | ICD-10-CM | POA: Diagnosis not present

## 2021-02-20 DIAGNOSIS — D631 Anemia in chronic kidney disease: Secondary | ICD-10-CM | POA: Diagnosis not present

## 2021-02-20 DIAGNOSIS — Z992 Dependence on renal dialysis: Secondary | ICD-10-CM | POA: Diagnosis not present

## 2021-02-23 DIAGNOSIS — Z992 Dependence on renal dialysis: Secondary | ICD-10-CM | POA: Diagnosis not present

## 2021-02-23 DIAGNOSIS — D509 Iron deficiency anemia, unspecified: Secondary | ICD-10-CM | POA: Diagnosis not present

## 2021-02-23 DIAGNOSIS — N2581 Secondary hyperparathyroidism of renal origin: Secondary | ICD-10-CM | POA: Diagnosis not present

## 2021-02-23 DIAGNOSIS — N186 End stage renal disease: Secondary | ICD-10-CM | POA: Diagnosis not present

## 2021-02-23 DIAGNOSIS — D631 Anemia in chronic kidney disease: Secondary | ICD-10-CM | POA: Diagnosis not present

## 2021-02-25 DIAGNOSIS — N2581 Secondary hyperparathyroidism of renal origin: Secondary | ICD-10-CM | POA: Diagnosis not present

## 2021-02-25 DIAGNOSIS — N186 End stage renal disease: Secondary | ICD-10-CM | POA: Diagnosis not present

## 2021-02-25 DIAGNOSIS — D509 Iron deficiency anemia, unspecified: Secondary | ICD-10-CM | POA: Diagnosis not present

## 2021-02-25 DIAGNOSIS — D631 Anemia in chronic kidney disease: Secondary | ICD-10-CM | POA: Diagnosis not present

## 2021-02-25 DIAGNOSIS — Z992 Dependence on renal dialysis: Secondary | ICD-10-CM | POA: Diagnosis not present

## 2021-02-27 DIAGNOSIS — D631 Anemia in chronic kidney disease: Secondary | ICD-10-CM | POA: Diagnosis not present

## 2021-02-27 DIAGNOSIS — N186 End stage renal disease: Secondary | ICD-10-CM | POA: Diagnosis not present

## 2021-02-27 DIAGNOSIS — D509 Iron deficiency anemia, unspecified: Secondary | ICD-10-CM | POA: Diagnosis not present

## 2021-02-27 DIAGNOSIS — N2581 Secondary hyperparathyroidism of renal origin: Secondary | ICD-10-CM | POA: Diagnosis not present

## 2021-02-27 DIAGNOSIS — Z992 Dependence on renal dialysis: Secondary | ICD-10-CM | POA: Diagnosis not present

## 2021-03-02 DIAGNOSIS — N186 End stage renal disease: Secondary | ICD-10-CM | POA: Diagnosis not present

## 2021-03-02 DIAGNOSIS — D631 Anemia in chronic kidney disease: Secondary | ICD-10-CM | POA: Diagnosis not present

## 2021-03-02 DIAGNOSIS — D509 Iron deficiency anemia, unspecified: Secondary | ICD-10-CM | POA: Diagnosis not present

## 2021-03-02 DIAGNOSIS — Z992 Dependence on renal dialysis: Secondary | ICD-10-CM | POA: Diagnosis not present

## 2021-03-02 DIAGNOSIS — N2581 Secondary hyperparathyroidism of renal origin: Secondary | ICD-10-CM | POA: Diagnosis not present

## 2021-03-03 DIAGNOSIS — N186 End stage renal disease: Secondary | ICD-10-CM | POA: Diagnosis not present

## 2021-03-03 DIAGNOSIS — Z992 Dependence on renal dialysis: Secondary | ICD-10-CM | POA: Diagnosis not present

## 2021-03-04 DIAGNOSIS — T827XXA Infection and inflammatory reaction due to other cardiac and vascular devices, implants and grafts, initial encounter: Secondary | ICD-10-CM | POA: Diagnosis not present

## 2021-03-04 DIAGNOSIS — D509 Iron deficiency anemia, unspecified: Secondary | ICD-10-CM | POA: Diagnosis not present

## 2021-03-04 DIAGNOSIS — N2581 Secondary hyperparathyroidism of renal origin: Secondary | ICD-10-CM | POA: Diagnosis not present

## 2021-03-04 DIAGNOSIS — Z992 Dependence on renal dialysis: Secondary | ICD-10-CM | POA: Diagnosis not present

## 2021-03-04 DIAGNOSIS — N186 End stage renal disease: Secondary | ICD-10-CM | POA: Diagnosis not present

## 2021-03-04 DIAGNOSIS — D631 Anemia in chronic kidney disease: Secondary | ICD-10-CM | POA: Diagnosis not present

## 2021-03-06 DIAGNOSIS — D509 Iron deficiency anemia, unspecified: Secondary | ICD-10-CM | POA: Diagnosis not present

## 2021-03-06 DIAGNOSIS — N2581 Secondary hyperparathyroidism of renal origin: Secondary | ICD-10-CM | POA: Diagnosis not present

## 2021-03-06 DIAGNOSIS — D631 Anemia in chronic kidney disease: Secondary | ICD-10-CM | POA: Diagnosis not present

## 2021-03-06 DIAGNOSIS — T827XXA Infection and inflammatory reaction due to other cardiac and vascular devices, implants and grafts, initial encounter: Secondary | ICD-10-CM | POA: Diagnosis not present

## 2021-03-06 DIAGNOSIS — N186 End stage renal disease: Secondary | ICD-10-CM | POA: Diagnosis not present

## 2021-03-06 DIAGNOSIS — Z992 Dependence on renal dialysis: Secondary | ICD-10-CM | POA: Diagnosis not present

## 2021-03-09 DIAGNOSIS — Z992 Dependence on renal dialysis: Secondary | ICD-10-CM | POA: Diagnosis not present

## 2021-03-09 DIAGNOSIS — N2581 Secondary hyperparathyroidism of renal origin: Secondary | ICD-10-CM | POA: Diagnosis not present

## 2021-03-09 DIAGNOSIS — D631 Anemia in chronic kidney disease: Secondary | ICD-10-CM | POA: Diagnosis not present

## 2021-03-09 DIAGNOSIS — D509 Iron deficiency anemia, unspecified: Secondary | ICD-10-CM | POA: Diagnosis not present

## 2021-03-09 DIAGNOSIS — N186 End stage renal disease: Secondary | ICD-10-CM | POA: Diagnosis not present

## 2021-03-09 DIAGNOSIS — T827XXA Infection and inflammatory reaction due to other cardiac and vascular devices, implants and grafts, initial encounter: Secondary | ICD-10-CM | POA: Diagnosis not present

## 2021-03-11 DIAGNOSIS — D631 Anemia in chronic kidney disease: Secondary | ICD-10-CM | POA: Diagnosis not present

## 2021-03-11 DIAGNOSIS — D509 Iron deficiency anemia, unspecified: Secondary | ICD-10-CM | POA: Diagnosis not present

## 2021-03-11 DIAGNOSIS — Z992 Dependence on renal dialysis: Secondary | ICD-10-CM | POA: Diagnosis not present

## 2021-03-11 DIAGNOSIS — N2581 Secondary hyperparathyroidism of renal origin: Secondary | ICD-10-CM | POA: Diagnosis not present

## 2021-03-11 DIAGNOSIS — N186 End stage renal disease: Secondary | ICD-10-CM | POA: Diagnosis not present

## 2021-03-11 DIAGNOSIS — T827XXA Infection and inflammatory reaction due to other cardiac and vascular devices, implants and grafts, initial encounter: Secondary | ICD-10-CM | POA: Diagnosis not present

## 2021-03-13 DIAGNOSIS — D631 Anemia in chronic kidney disease: Secondary | ICD-10-CM | POA: Diagnosis not present

## 2021-03-13 DIAGNOSIS — N2581 Secondary hyperparathyroidism of renal origin: Secondary | ICD-10-CM | POA: Diagnosis not present

## 2021-03-13 DIAGNOSIS — Z992 Dependence on renal dialysis: Secondary | ICD-10-CM | POA: Diagnosis not present

## 2021-03-13 DIAGNOSIS — T827XXA Infection and inflammatory reaction due to other cardiac and vascular devices, implants and grafts, initial encounter: Secondary | ICD-10-CM | POA: Diagnosis not present

## 2021-03-13 DIAGNOSIS — D509 Iron deficiency anemia, unspecified: Secondary | ICD-10-CM | POA: Diagnosis not present

## 2021-03-13 DIAGNOSIS — N186 End stage renal disease: Secondary | ICD-10-CM | POA: Diagnosis not present

## 2021-03-14 DIAGNOSIS — N186 End stage renal disease: Secondary | ICD-10-CM | POA: Diagnosis not present

## 2021-03-14 DIAGNOSIS — Z992 Dependence on renal dialysis: Secondary | ICD-10-CM | POA: Diagnosis not present

## 2021-03-14 DIAGNOSIS — T827XXA Infection and inflammatory reaction due to other cardiac and vascular devices, implants and grafts, initial encounter: Secondary | ICD-10-CM | POA: Diagnosis not present

## 2021-03-14 DIAGNOSIS — N2581 Secondary hyperparathyroidism of renal origin: Secondary | ICD-10-CM | POA: Diagnosis not present

## 2021-03-14 DIAGNOSIS — D509 Iron deficiency anemia, unspecified: Secondary | ICD-10-CM | POA: Diagnosis not present

## 2021-03-14 DIAGNOSIS — D631 Anemia in chronic kidney disease: Secondary | ICD-10-CM | POA: Diagnosis not present

## 2021-03-15 ENCOUNTER — Other Ambulatory Visit: Payer: Self-pay

## 2021-03-15 ENCOUNTER — Emergency Department (HOSPITAL_COMMUNITY)
Admission: EM | Admit: 2021-03-15 | Discharge: 2021-03-15 | Disposition: A | Discharge status: Home / Self Care | Payer: Medicare Other | Source: Home / Self Care | Attending: Emergency Medicine | Admitting: Emergency Medicine

## 2021-03-15 ENCOUNTER — Encounter (HOSPITAL_COMMUNITY): Payer: Self-pay | Admitting: Emergency Medicine

## 2021-03-15 ENCOUNTER — Emergency Department (HOSPITAL_COMMUNITY): Payer: Medicare Other

## 2021-03-15 DIAGNOSIS — I071 Rheumatic tricuspid insufficiency: Secondary | ICD-10-CM | POA: Diagnosis not present

## 2021-03-15 DIAGNOSIS — E11319 Type 2 diabetes mellitus with unspecified diabetic retinopathy without macular edema: Secondary | ICD-10-CM | POA: Diagnosis not present

## 2021-03-15 DIAGNOSIS — I120 Hypertensive chronic kidney disease with stage 5 chronic kidney disease or end stage renal disease: Secondary | ICD-10-CM | POA: Insufficient documentation

## 2021-03-15 DIAGNOSIS — R112 Nausea with vomiting, unspecified: Secondary | ICD-10-CM | POA: Diagnosis not present

## 2021-03-15 DIAGNOSIS — E118 Type 2 diabetes mellitus with unspecified complications: Secondary | ICD-10-CM | POA: Diagnosis not present

## 2021-03-15 DIAGNOSIS — I340 Nonrheumatic mitral (valve) insufficiency: Secondary | ICD-10-CM | POA: Diagnosis not present

## 2021-03-15 DIAGNOSIS — E871 Hypo-osmolality and hyponatremia: Secondary | ICD-10-CM | POA: Insufficient documentation

## 2021-03-15 DIAGNOSIS — Z20822 Contact with and (suspected) exposure to covid-19: Secondary | ICD-10-CM | POA: Diagnosis not present

## 2021-03-15 DIAGNOSIS — D649 Anemia, unspecified: Secondary | ICD-10-CM | POA: Insufficient documentation

## 2021-03-15 DIAGNOSIS — I10 Essential (primary) hypertension: Secondary | ICD-10-CM | POA: Diagnosis not present

## 2021-03-15 DIAGNOSIS — E1165 Type 2 diabetes mellitus with hyperglycemia: Secondary | ICD-10-CM | POA: Insufficient documentation

## 2021-03-15 DIAGNOSIS — I700 Atherosclerosis of aorta: Secondary | ICD-10-CM | POA: Diagnosis not present

## 2021-03-15 DIAGNOSIS — K529 Noninfective gastroenteritis and colitis, unspecified: Secondary | ICD-10-CM | POA: Insufficient documentation

## 2021-03-15 DIAGNOSIS — R7401 Elevation of levels of liver transaminase levels: Secondary | ICD-10-CM | POA: Insufficient documentation

## 2021-03-15 DIAGNOSIS — B9561 Methicillin susceptible Staphylococcus aureus infection as the cause of diseases classified elsewhere: Secondary | ICD-10-CM | POA: Diagnosis not present

## 2021-03-15 DIAGNOSIS — R197 Diarrhea, unspecified: Secondary | ICD-10-CM

## 2021-03-15 DIAGNOSIS — N186 End stage renal disease: Secondary | ICD-10-CM | POA: Diagnosis not present

## 2021-03-15 DIAGNOSIS — D72829 Elevated white blood cell count, unspecified: Secondary | ICD-10-CM | POA: Insufficient documentation

## 2021-03-15 DIAGNOSIS — M898X9 Other specified disorders of bone, unspecified site: Secondary | ICD-10-CM | POA: Diagnosis present

## 2021-03-15 DIAGNOSIS — R7989 Other specified abnormal findings of blood chemistry: Secondary | ICD-10-CM | POA: Diagnosis present

## 2021-03-15 DIAGNOSIS — Z794 Long term (current) use of insulin: Secondary | ICD-10-CM | POA: Diagnosis not present

## 2021-03-15 DIAGNOSIS — R7881 Bacteremia: Secondary | ICD-10-CM | POA: Diagnosis not present

## 2021-03-15 DIAGNOSIS — L988 Other specified disorders of the skin and subcutaneous tissue: Secondary | ICD-10-CM | POA: Insufficient documentation

## 2021-03-15 DIAGNOSIS — R509 Fever, unspecified: Secondary | ICD-10-CM | POA: Diagnosis not present

## 2021-03-15 DIAGNOSIS — Z992 Dependence on renal dialysis: Secondary | ICD-10-CM | POA: Diagnosis not present

## 2021-03-15 DIAGNOSIS — E1122 Type 2 diabetes mellitus with diabetic chronic kidney disease: Secondary | ICD-10-CM | POA: Insufficient documentation

## 2021-03-15 DIAGNOSIS — D631 Anemia in chronic kidney disease: Secondary | ICD-10-CM | POA: Diagnosis not present

## 2021-03-15 DIAGNOSIS — E785 Hyperlipidemia, unspecified: Secondary | ICD-10-CM | POA: Diagnosis present

## 2021-03-15 DIAGNOSIS — Z833 Family history of diabetes mellitus: Secondary | ICD-10-CM | POA: Diagnosis not present

## 2021-03-15 DIAGNOSIS — H548 Legal blindness, as defined in USA: Secondary | ICD-10-CM | POA: Diagnosis present

## 2021-03-15 DIAGNOSIS — R109 Unspecified abdominal pain: Secondary | ICD-10-CM | POA: Diagnosis not present

## 2021-03-15 DIAGNOSIS — Z79899 Other long term (current) drug therapy: Secondary | ICD-10-CM | POA: Diagnosis not present

## 2021-03-15 DIAGNOSIS — F32A Depression, unspecified: Secondary | ICD-10-CM | POA: Diagnosis not present

## 2021-03-15 DIAGNOSIS — I959 Hypotension, unspecified: Secondary | ICD-10-CM | POA: Diagnosis not present

## 2021-03-15 DIAGNOSIS — D696 Thrombocytopenia, unspecified: Secondary | ICD-10-CM | POA: Diagnosis not present

## 2021-03-15 DIAGNOSIS — N250 Renal osteodystrophy: Secondary | ICD-10-CM | POA: Diagnosis not present

## 2021-03-15 DIAGNOSIS — R531 Weakness: Secondary | ICD-10-CM | POA: Diagnosis not present

## 2021-03-15 LAB — CBC
HCT: 33.3 % — ABNORMAL LOW (ref 39.0–52.0)
Hemoglobin: 11.1 g/dL — ABNORMAL LOW (ref 13.0–17.0)
MCH: 32.2 pg (ref 26.0–34.0)
MCHC: 33.3 g/dL (ref 30.0–36.0)
MCV: 96.5 fL (ref 80.0–100.0)
Platelets: 106 10*3/uL — ABNORMAL LOW (ref 150–400)
RBC: 3.45 MIL/uL — ABNORMAL LOW (ref 4.22–5.81)
RDW: 13.6 % (ref 11.5–15.5)
WBC: 12.7 10*3/uL — ABNORMAL HIGH (ref 4.0–10.5)
nRBC: 0 % (ref 0.0–0.2)

## 2021-03-15 LAB — LIPASE, BLOOD: Lipase: 19 U/L (ref 11–51)

## 2021-03-15 LAB — RESP PANEL BY RT-PCR (FLU A&B, COVID) ARPGX2
Influenza A by PCR: NEGATIVE
Influenza B by PCR: NEGATIVE
SARS Coronavirus 2 by RT PCR: NEGATIVE

## 2021-03-15 LAB — LACTIC ACID, PLASMA
Lactic Acid, Venous: 1.9 mmol/L (ref 0.5–1.9)
Lactic Acid, Venous: 1.1 mmol/L (ref 0.5–1.9)

## 2021-03-15 LAB — COMPREHENSIVE METABOLIC PANEL
ALT: 50 U/L — ABNORMAL HIGH (ref 0–44)
AST: 69 U/L — ABNORMAL HIGH (ref 15–41)
Albumin: 3.8 g/dL (ref 3.5–5.0)
Alkaline Phosphatase: 43 U/L (ref 38–126)
Anion gap: 17 — ABNORMAL HIGH (ref 5–15)
BUN: 66 mg/dL — ABNORMAL HIGH (ref 6–20)
CO2: 22 mmol/L (ref 22–32)
Calcium: 9.1 mg/dL (ref 8.9–10.3)
Chloride: 90 mmol/L — ABNORMAL LOW (ref 98–111)
Creatinine, Ser: 8.68 mg/dL — ABNORMAL HIGH (ref 0.61–1.24)
GFR, Estimated: 7 mL/min — ABNORMAL LOW (ref >60)
Glucose, Bld: 276 mg/dL — ABNORMAL HIGH (ref 70–99)
Potassium: 3.8 mmol/L (ref 3.5–5.1)
Sodium: 129 mmol/L — ABNORMAL LOW (ref 135–145)
Total Bilirubin: 0.8 mg/dL (ref 0.3–1.2)
Total Protein: 7.6 g/dL (ref 6.5–8.1)

## 2021-03-15 LAB — CBG MONITORING, ED: Glucose-Capillary: 223 mg/dL — ABNORMAL HIGH (ref 70–99)

## 2021-03-15 MED ORDER — ACETAMINOPHEN 500 MG PO TABS
1000.0000 mg | ORAL_TABLET | Freq: Once | ORAL | Status: AC
  Administered 2021-03-15: 1000 mg via ORAL
  Filled 2021-03-15: qty 2

## 2021-03-15 MED ORDER — ONDANSETRON 4 MG PO TBDP
4.0000 mg | ORAL_TABLET | Freq: Once | ORAL | Status: AC | PRN
  Administered 2021-03-15: 4 mg via ORAL
  Filled 2021-03-15: qty 1

## 2021-03-15 MED ORDER — ONDANSETRON HCL 4 MG/2ML IJ SOLN
4.0000 mg | Freq: Once | INTRAMUSCULAR | Status: AC
  Administered 2021-03-15: 4 mg via INTRAVENOUS
  Filled 2021-03-15: qty 2

## 2021-03-15 MED ORDER — ONDANSETRON 4 MG PO TBDP: 4.0000 mg | ORAL_TABLET | Freq: Three times a day (TID) | ORAL | 0 refills | Status: DC | PRN

## 2021-03-15 MED ORDER — SODIUM CHLORIDE 0.9 % IV BOLUS
500.0000 mL | Freq: Once | INTRAVENOUS | Status: AC
  Administered 2021-03-15: 500 mL via INTRAVENOUS

## 2021-03-15 MED ORDER — ONDANSETRON 8 MG PO TBDP
8.0000 mg | ORAL_TABLET | Freq: Once | ORAL | Status: AC
  Administered 2021-03-15: 8 mg via ORAL
  Filled 2021-03-15: qty 1

## 2021-03-15 NOTE — Discharge Instructions (Signed)
You were seen in the emergency department for evaluation of nausea vomiting and diarrhea.  You had lab work and a CAT scan of your abdomen and pelvis that did not show an obvious explanation for your symptoms.  Please monitor for fever and use nausea medication as needed.  Follow-up with your primary care doctor.  Return to the emergency department if any worsening or concerning symptoms ?

## 2021-03-15 NOTE — ED Triage Notes (Signed)
Pt presents today c/o diarrhea and vomiting since yesterday. Pt is a dialysis pt, last seen there on Friday. Denies being around anyone with similar sx but could have been at dialysis. ?

## 2021-03-15 NOTE — ED Provider Notes (Addendum)
[facility] EMERGENCY DEPARTMENT Provider Note   CSN: [id number] Arrival date & time: 03/15/21  1529     History  Chief Complaint  Patient presents with   Emesis   Diarrhea    Paul Gonzalez is a 56 y.o. male.  He has a history of end-stage renal disease hypertension diabetes and is legally blind.  He is brought in for evaluation of nausea vomiting diarrhea since yesterday.  No sick contacts.  Did not realize had a fever.  No cough or shortness of breath.  Denies any abdominal pain.  Last episode of vomiting this morning, has vomited 3 times.  Last episode of diarrhea was here in the waiting room.  No blood from either end.  The history is provided by the patient.  Emesis Severity:  Moderate Duration:  2 days Timing:  Sporadic Number of daily episodes:  3 Progression:  Unchanged Chronicity:  New Recent urination:  Absent Relieved by:  None tried Worsened by:  Nothing Ineffective treatments:  None tried Associated symptoms: diarrhea   Associated symptoms: no abdominal pain, no cough, no fever, no headaches and no sore throat   Risk factors: no sick contacts   Diarrhea Quality:  Watery Severity:  Moderate Onset quality:  Gradual Duration:  2 days Timing:  Intermittent Progression:  Unchanged Relieved by:  None tried Worsened by:  Nothing Ineffective treatments:  None tried Associated symptoms: vomiting   Associated symptoms: no abdominal pain, no recent cough, no fever and no headaches   Risk factors: no recent antibiotic use       Home Medications Prior to Admission medications   Medication Sig Start Date End Date Taking? Authorizing Provider  atorvastatin (LIPITOR) 20 MG tablet Take 1 tablet by mouth once daily 02/12/21   Coral Spikes, DO  calcium acetate (PHOSLO) 667 MG capsule Take 667 mg by mouth 3 (three) times daily with meals. 10/05/20   [provider]  citalopram (CELEXA) 40 MG tablet Take 1 tablet (40 mg total) by mouth daily. 10/06/20   Kathyrn Drown, MD  fenofibrate (TRICOR) 145 MG tablet Take 1 tablet (145 mg total) by mouth daily. 12/09/20 03/09/21  Coral Spikes, DO  insulin NPH Human (NOVOLIN N) 100 UNIT/ML injection 10 units qhs Patient taking differently: Inject 10-25 Units into the skin at bedtime. 04/27/18   Mikey Kirschner, MD  Insulin Syringe-Needle U-100 (BD VEO INSULIN SYRINGE U/F) 31G X 15/64" 0.3 ML MISC USE AS DIRECTED 04/04/20   Lovena Le, Malena M, DO  loratadine (CLARITIN) 10 MG tablet Take 1 tablet (10 mg total) by mouth daily. 10/06/20   Kathyrn Drown, MD  Multiple Vitamin (MULTIVITAMIN) tablet Take 1 tablet by mouth daily.    [provider]  sevelamer carbonate (RENVELA) 800 MG tablet Take 1,600 mg by mouth 3 (three) times daily with meals. 10/15/20   [provider]      Allergies    Patient has no known allergies.    Review of Systems   Review of Systems  Constitutional:  Negative for fever.  HENT:  Negative for sore throat.   Eyes:  Negative for pain.  Respiratory:  Negative for cough and shortness of breath.   Cardiovascular:  Negative for chest pain.  Gastrointestinal:  Positive for diarrhea and vomiting. Negative for abdominal pain.  Genitourinary:  Negative for dysuria.  Skin:  Negative for rash.  Neurological:  Negative for headaches.   Physical Exam Updated Vital Signs BP (!) 161/67  Pulse (!) 101    Temp (!) 102.3 F (39.1 C) (Oral)    Resp 18    Ht '5\' 8"'$  (1.727 m)    Wt 79.8 kg    SpO2 100%    BMI 26.76 kg/m  Physical Exam Vitals and nursing note reviewed.  Constitutional:      General: He is not in acute distress.    Appearance: Normal appearance. He is well-developed.  HENT:     Head: Normocephalic and atraumatic.  Eyes:     Conjunctiva/sclera: Conjunctivae normal.  Cardiovascular:     Rate and Rhythm: Normal rate and regular rhythm.     Heart sounds: No murmur heard. Pulmonary:     Effort: Pulmonary effort is normal. No respiratory distress.     Breath sounds:  Normal breath sounds.  Abdominal:     Palpations: Abdomen is soft.     Tenderness: There is no abdominal tenderness. There is no guarding or rebound.  Musculoskeletal:        General: No swelling. Normal range of motion.     Cervical back: Neck supple.     Comments: Fistula left forearm positive thrill  Skin:    General: Skin is warm and dry.     Capillary Refill: Capillary refill takes less than 2 seconds.  Neurological:     General: No focal deficit present.     Mental Status: He is alert.    ED Results / Procedures / Treatments   Labs (all labs ordered are listed, but only abnormal results are displayed) Labs Reviewed  COMPREHENSIVE METABOLIC PANEL - Abnormal; Notable for the following components:      Result Value   Sodium 129 (*)    Chloride 90 (*)    Glucose, Bld 276 (*)    BUN 66 (*)    Creatinine, Ser 8.68 (*)    AST 69 (*)    ALT 50 (*)    GFR, Estimated 7 (*)    Anion gap 17 (*)    All other components within normal limits  CBC - Abnormal; Notable for the following components:   WBC 12.7 (*)    RBC 3.45 (*)    Hemoglobin 11.1 (*)    HCT 33.3 (*)    Platelets 106 (*)    All other components within normal limits  CBG MONITORING, ED - Abnormal; Notable for the following components:   Glucose-Capillary 223 (*)    All other components within normal limits  CULTURE, BLOOD (ROUTINE X 2)  CULTURE, BLOOD (ROUTINE X 2)  RESP PANEL BY RT-PCR (FLU A&B, COVID) ARPGX2  GASTROINTESTINAL PANEL BY PCR, STOOL (REPLACES STOOL CULTURE)  LIPASE, BLOOD  LACTIC ACID, PLASMA  LACTIC ACID, PLASMA    EKG None  Radiology CT Abdomen Pelvis Wo Contrast  Result Date: 03/15/2021 CLINICAL DATA:  Abdominal pain. Diarrhea and vomiting since yesterday. On dialysis. EXAM: CT ABDOMEN AND PELVIS WITHOUT CONTRAST TECHNIQUE: Multidetector CT imaging of the abdomen and pelvis was performed following the standard protocol without IV contrast. RADIATION DOSE REDUCTION: This exam was performed  according to the departmental dose-optimization program which includes automated exposure control, adjustment of the mA and/or kV according to patient size and/or use of iterative reconstruction technique. COMPARISON:  03/02/2013 FINDINGS: Lower chest: Clear lung bases. Normal heart size without pericardial or pleural effusion. Right coronary artery calcification. Aortic valve calcification. Suspect mild distal esophageal wall thickening. Hepatobiliary: Normal liver. Mild gallbladder distension, without calcified stone or specific evidence of acute cholecystitis. Pancreas: Normal,  without mass or ductal dilatation. Spleen: Normal in size, without focal abnormality. Adrenals/Urinary Tract: Normal adrenal glands. Mild renal cortical thinning bilaterally. Multiple bilateral renal collecting system calculi. Maximally approximately 4 mm. No hydronephrosis. No hydroureter or ureteric calculi. Equivocal punctate stone at the right ureterovesicular junction on coronal image 72 and sagittal image 65. Stomach/Bowel: Normal stomach, without wall thickening. Normal colon, appendix, and terminal ileum. Normal small bowel. Vascular/Lymphatic: Aortic atherosclerosis. No abdominopelvic adenopathy. Reproductive: Normal prostate. Other: No significant free fluid.  No free intraperitoneal air. Musculoskeletal: No acute osseous abnormality. IMPRESSION: 1.  No acute process in the abdomen or pelvis. 2. Bilateral nephrolithiasis. Equivocal punctate stone at the right ureterovesicular junction. Correlate with right lower quadrant symptoms and urinalysis. 3. Coronary artery atherosclerosis. Aortic Atherosclerosis (ICD10-I70.0). 4. Aortic valvular calcifications. Consider echocardiography to evaluate for valvular dysfunction. 5. Possible mild esophageal wall thickening as can be seen with esophagitis. Electronically Signed   By: Abigail Miyamoto M.D.   On: 03/15/2021 18:48    Procedures Procedures    Medications Ordered in ED Medications   sodium chloride 0.9 % bolus 500 mL (has no administration in time range)  ondansetron (ZOFRAN) injection 4 mg (has no administration in time range)  ondansetron (ZOFRAN-ODT) disintegrating tablet 4 mg (4 mg Oral Given 03/15/21 1550)  acetaminophen (TYLENOL) tablet 1,000 mg (1,000 mg Oral Given 03/15/21 1553)    ED Course/ Medical Decision Making/ A&P Clinical Course as of 03/16/21 1003  Sun Mar 15, 2021  1943 Patient states he feels better now and does not have any nausea or vomiting and not had any diarrhea while here.  Has not been able to give a stool sample.  CT not showing any acute findings.  Will discharge on some Zofran and have follow-up with PCP and nephrology. [MB]    Clinical Course User Index [MB] Hayden Rasmussen, MD                           Medical Decision Making Amount and/or Complexity of Data Reviewed Labs: ordered. Radiology: ordered.  Risk OTC drugs. Prescription drug management.  Raidon Swanner was evaluated in Emergency Department on 03/15/2021 for the symptoms described in the history of present illness. He was evaluated in the context of the global COVID-19 pandemic, which necessitated consideration that the patient might be at risk for infection with the SARS-CoV-2 virus that causes COVID-19. Institutional protocols and algorithms that pertain to the evaluation of patients at risk for COVID-19 are in a state of rapid change based on information released by regulatory bodies including the CDC and federal and state organizations. These policies and algorithms were followed during the patient's care in the ED.  This patient complains of nausea vomiting diarrhea fever; this involves an extensive number of treatment Options and is a complaint that carries with it a high risk of complications and morbidity. The differential includes, but not inclusive of gastroenteritis, colitis, diverticulitis, sepsis, Sirs  I ordered, reviewed and interpreted labs, which included  CBC with elevated white count, hemoglobin low stable from priors, chemistries with low sodium elevated glucose elevated BUN and creatinine reflective of his chronic renal failure, mild elevation of AST and ALT, COVID and flu negative, blood culture sent, lactate not significantly elevated, stool studies ordered not obtained I ordered medication IV fluids and nausea medication, Tylenol for fever and reviewed PMP when indicated. I ordered imaging studies which included CT abdomen and pelvis and I independently    visualized  and interpreted imaging which showed no acute findings Additional history obtained from patient's SO Previous records obtained and reviewed in epic no recent admissions Cardiac monitoring reviewed, normal sinus rhythm Social determinants considered, patient with stress and financial concerns social isolation Critical Interventions: None  After the interventions stated above, I reevaluated the patient and found patient to be feeling symptomatically improved. Admission and further testing considered, no indications for antibiotics or admission at this time.  Counseled to monitor for fever and stay well-hydrated.  Commended close follow-up with his treating providers.  Return instructions discussed          Final Clinical Impression(s) / ED Diagnoses Final diagnoses:  Nausea vomiting and diarrhea  Gastroenteritis  Fever, unspecified fever cause    Rx / DC Orders ED Discharge Orders     None         Hayden Rasmussen, MD 03/16/21 1013    Hayden Rasmussen, MD 03/16/21 1015

## 2021-03-16 ENCOUNTER — Encounter (HOSPITAL_COMMUNITY): Payer: Self-pay | Admitting: Emergency Medicine

## 2021-03-16 ENCOUNTER — Inpatient Hospital Stay (HOSPITAL_COMMUNITY)
Admission: EM | Admit: 2021-03-16 | Discharge: 2021-03-19 | DRG: 871 | Disposition: A | Discharge status: Home / Self Care | Payer: Medicare Other | Attending: Family Medicine | Admitting: Family Medicine

## 2021-03-16 ENCOUNTER — Other Ambulatory Visit: Payer: Self-pay

## 2021-03-16 ENCOUNTER — Telehealth (HOSPITAL_BASED_OUTPATIENT_CLINIC_OR_DEPARTMENT_OTHER): Payer: Self-pay | Admitting: *Deleted

## 2021-03-16 DIAGNOSIS — M898X9 Other specified disorders of bone, unspecified site: Secondary | ICD-10-CM | POA: Diagnosis present

## 2021-03-16 DIAGNOSIS — E118 Type 2 diabetes mellitus with unspecified complications: Secondary | ICD-10-CM | POA: Diagnosis present

## 2021-03-16 DIAGNOSIS — E11319 Type 2 diabetes mellitus with unspecified diabetic retinopathy without macular edema: Secondary | ICD-10-CM | POA: Diagnosis present

## 2021-03-16 DIAGNOSIS — Z992 Dependence on renal dialysis: Secondary | ICD-10-CM | POA: Diagnosis not present

## 2021-03-16 DIAGNOSIS — N186 End stage renal disease: Secondary | ICD-10-CM

## 2021-03-16 DIAGNOSIS — R7989 Other specified abnormal findings of blood chemistry: Secondary | ICD-10-CM | POA: Diagnosis present

## 2021-03-16 DIAGNOSIS — N250 Renal osteodystrophy: Secondary | ICD-10-CM | POA: Diagnosis not present

## 2021-03-16 DIAGNOSIS — E1122 Type 2 diabetes mellitus with diabetic chronic kidney disease: Secondary | ICD-10-CM | POA: Diagnosis present

## 2021-03-16 DIAGNOSIS — Z833 Family history of diabetes mellitus: Secondary | ICD-10-CM | POA: Diagnosis not present

## 2021-03-16 DIAGNOSIS — D696 Thrombocytopenia, unspecified: Secondary | ICD-10-CM | POA: Diagnosis present

## 2021-03-16 DIAGNOSIS — R531 Weakness: Secondary | ICD-10-CM | POA: Diagnosis not present

## 2021-03-16 DIAGNOSIS — I071 Rheumatic tricuspid insufficiency: Secondary | ICD-10-CM | POA: Diagnosis present

## 2021-03-16 DIAGNOSIS — B9561 Methicillin susceptible Staphylococcus aureus infection as the cause of diseases classified elsewhere: Secondary | ICD-10-CM | POA: Diagnosis present

## 2021-03-16 DIAGNOSIS — I120 Hypertensive chronic kidney disease with stage 5 chronic kidney disease or end stage renal disease: Secondary | ICD-10-CM | POA: Diagnosis present

## 2021-03-16 DIAGNOSIS — R112 Nausea with vomiting, unspecified: Secondary | ICD-10-CM | POA: Diagnosis not present

## 2021-03-16 DIAGNOSIS — Z794 Long term (current) use of insulin: Secondary | ICD-10-CM

## 2021-03-16 DIAGNOSIS — F32A Depression, unspecified: Secondary | ICD-10-CM | POA: Diagnosis present

## 2021-03-16 DIAGNOSIS — I10 Essential (primary) hypertension: Secondary | ICD-10-CM | POA: Diagnosis present

## 2021-03-16 DIAGNOSIS — Z79899 Other long term (current) drug therapy: Secondary | ICD-10-CM | POA: Diagnosis not present

## 2021-03-16 DIAGNOSIS — R7881 Bacteremia: Principal | ICD-10-CM | POA: Diagnosis present

## 2021-03-16 DIAGNOSIS — H548 Legal blindness, as defined in USA: Secondary | ICD-10-CM | POA: Diagnosis present

## 2021-03-16 DIAGNOSIS — K529 Noninfective gastroenteritis and colitis, unspecified: Secondary | ICD-10-CM | POA: Diagnosis present

## 2021-03-16 DIAGNOSIS — E785 Hyperlipidemia, unspecified: Secondary | ICD-10-CM | POA: Diagnosis present

## 2021-03-16 DIAGNOSIS — N189 Chronic kidney disease, unspecified: Secondary | ICD-10-CM | POA: Diagnosis present

## 2021-03-16 DIAGNOSIS — I340 Nonrheumatic mitral (valve) insufficiency: Secondary | ICD-10-CM | POA: Diagnosis not present

## 2021-03-16 DIAGNOSIS — I959 Hypotension, unspecified: Secondary | ICD-10-CM | POA: Diagnosis not present

## 2021-03-16 DIAGNOSIS — D631 Anemia in chronic kidney disease: Secondary | ICD-10-CM | POA: Diagnosis present

## 2021-03-16 DIAGNOSIS — Z20822 Contact with and (suspected) exposure to covid-19: Secondary | ICD-10-CM | POA: Diagnosis present

## 2021-03-16 LAB — CBC WITH DIFFERENTIAL/PLATELET
Abs Immature Granulocytes: 0.07 10*3/uL (ref 0.00–0.07)
Basophils Absolute: 0 10*3/uL (ref 0.0–0.1)
Basophils Relative: 0 %
Eosinophils Absolute: 0 10*3/uL (ref 0.0–0.5)
Eosinophils Relative: 0 %
HCT: 30.5 % — ABNORMAL LOW (ref 39.0–52.0)
Hemoglobin: 10.2 g/dL — ABNORMAL LOW (ref 13.0–17.0)
Immature Granulocytes: 1 %
Lymphocytes Relative: 2 %
Lymphs Abs: 0.2 10*3/uL — ABNORMAL LOW (ref 0.7–4.0)
MCH: 32.2 pg (ref 26.0–34.0)
MCHC: 33.4 g/dL (ref 30.0–36.0)
MCV: 96.2 fL (ref 80.0–100.0)
Monocytes Absolute: 0.4 10*3/uL (ref 0.1–1.0)
Monocytes Relative: 4 %
Neutro Abs: 8.7 10*3/uL — ABNORMAL HIGH (ref 1.7–7.7)
Neutrophils Relative %: 93 %
Platelets: 81 10*3/uL — ABNORMAL LOW (ref 150–400)
RBC: 3.17 MIL/uL — ABNORMAL LOW (ref 4.22–5.81)
RDW: 13.4 % (ref 11.5–15.5)
WBC: 9.3 10*3/uL (ref 4.0–10.5)
nRBC: 0 % (ref 0.0–0.2)

## 2021-03-16 LAB — COMPREHENSIVE METABOLIC PANEL
ALT: 79 U/L — ABNORMAL HIGH (ref 0–44)
AST: 108 U/L — ABNORMAL HIGH (ref 15–41)
Albumin: 3.3 g/dL — ABNORMAL LOW (ref 3.5–5.0)
Alkaline Phosphatase: 39 U/L (ref 38–126)
Anion gap: 18 — ABNORMAL HIGH (ref 5–15)
BUN: 98 mg/dL — ABNORMAL HIGH (ref 6–20)
CO2: 20 mmol/L — ABNORMAL LOW (ref 22–32)
Calcium: 8.9 mg/dL (ref 8.9–10.3)
Chloride: 93 mmol/L — ABNORMAL LOW (ref 98–111)
Creatinine, Ser: 9.93 mg/dL — ABNORMAL HIGH (ref 0.61–1.24)
GFR, Estimated: 6 mL/min — ABNORMAL LOW (ref >60)
Glucose, Bld: 296 mg/dL — ABNORMAL HIGH (ref 70–99)
Potassium: 4.1 mmol/L (ref 3.5–5.1)
Sodium: 131 mmol/L — ABNORMAL LOW (ref 135–145)
Total Bilirubin: 1 mg/dL (ref 0.3–1.2)
Total Protein: 6.8 g/dL (ref 6.5–8.1)

## 2021-03-16 LAB — BLOOD CULTURE ID PANEL (REFLEXED) - BCID2

## 2021-03-16 LAB — GLUCOSE, CAPILLARY
Glucose-Capillary: 266 mg/dL — ABNORMAL HIGH (ref 70–99)
Glucose-Capillary: 130 mg/dL — ABNORMAL HIGH (ref 70–99)

## 2021-03-16 MED ORDER — ONDANSETRON HCL 4 MG/2ML IJ SOLN
4.0000 mg | Freq: Four times a day (QID) | INTRAMUSCULAR | Status: DC | PRN
  Administered 2021-03-16 – 2021-03-18 (×4): 4 mg via INTRAVENOUS
  Filled 2021-03-16 (×4): qty 2

## 2021-03-16 MED ORDER — ALUM & MAG HYDROXIDE-SIMETH 200-200-20 MG/5ML PO SUSP
30.0000 mL | ORAL | Status: DC | PRN
  Administered 2021-03-16: 30 mL via ORAL
  Filled 2021-03-16: qty 30

## 2021-03-16 MED ORDER — INSULIN ASPART 100 UNIT/ML IJ SOLN
0.0000 [IU] | Freq: Three times a day (TID) | INTRAMUSCULAR | Status: DC
  Administered 2021-03-17 – 2021-03-19 (×3): 1 [IU] via SUBCUTANEOUS

## 2021-03-16 MED ORDER — ACETAMINOPHEN 325 MG PO TABS
650.0000 mg | ORAL_TABLET | Freq: Four times a day (QID) | ORAL | Status: DC | PRN
Start: 2021-03-16 — End: 2021-03-19
  Administered 2021-03-17 – 2021-03-19 (×7): 650 mg via ORAL
  Filled 2021-03-16 (×7): qty 2

## 2021-03-16 MED ORDER — CHLORHEXIDINE GLUCONATE CLOTH 2 % EX PADS
6.0000 | MEDICATED_PAD | Freq: Every day | CUTANEOUS | Status: DC
  Administered 2021-03-17 – 2021-03-19 (×3): 6 via TOPICAL

## 2021-03-16 MED ORDER — HYDRALAZINE HCL 20 MG/ML IJ SOLN: 10.0000 mg | Freq: Four times a day (QID) | INTRAMUSCULAR | Status: DC | PRN

## 2021-03-16 MED ORDER — INSULIN ASPART 100 UNIT/ML IJ SOLN: 0.0000 [IU] | Freq: Every day | INTRAMUSCULAR | Status: DC

## 2021-03-16 MED ORDER — SEVELAMER CARBONATE 800 MG PO TABS
1600.0000 mg | ORAL_TABLET | Freq: Three times a day (TID) | ORAL | Status: DC
  Filled 2021-03-16: qty 2

## 2021-03-16 MED ORDER — FENOFIBRATE 160 MG PO TABS
160.0000 mg | ORAL_TABLET | Freq: Every day | ORAL | Status: DC
  Administered 2021-03-16 – 2021-03-19 (×4): 160 mg via ORAL
  Filled 2021-03-16 (×4): qty 1

## 2021-03-16 MED ORDER — INSULIN GLARGINE-YFGN 100 UNIT/ML ~~LOC~~ SOLN
10.0000 [IU] | Freq: Every day | SUBCUTANEOUS | Status: DC
Start: 2021-03-16 — End: 2021-03-19
  Administered 2021-03-16 – 2021-03-18 (×3): 10 [IU] via SUBCUTANEOUS
  Filled 2021-03-16 (×4): qty 0.1

## 2021-03-16 MED ORDER — SODIUM CHLORIDE 0.9% FLUSH
3.0000 mL | Freq: Two times a day (BID) | INTRAVENOUS | Status: DC
  Administered 2021-03-17: 3 mL via INTRAVENOUS

## 2021-03-16 MED ORDER — INSULIN ASPART 100 UNIT/ML IJ SOLN
12.0000 [IU] | Freq: Once | INTRAMUSCULAR | Status: AC
  Administered 2021-03-16: 12 [IU] via SUBCUTANEOUS

## 2021-03-16 MED ORDER — SODIUM CHLORIDE 0.9% FLUSH: 3.0000 mL | INTRAVENOUS | Status: DC | PRN

## 2021-03-16 MED ORDER — ATORVASTATIN CALCIUM 10 MG PO TABS
20.0000 mg | ORAL_TABLET | Freq: Every day | ORAL | Status: DC
  Administered 2021-03-16 – 2021-03-18 (×3): 20 mg via ORAL
  Filled 2021-03-16 (×3): qty 2

## 2021-03-16 MED ORDER — SODIUM CHLORIDE 0.9% FLUSH
3.0000 mL | Freq: Two times a day (BID) | INTRAVENOUS | Status: DC
  Administered 2021-03-16 – 2021-03-19 (×4): 3 mL via INTRAVENOUS

## 2021-03-16 MED ORDER — CALCIUM ACETATE (PHOS BINDER) 667 MG PO CAPS
667.0000 mg | ORAL_CAPSULE | Freq: Three times a day (TID) | ORAL | Status: DC
  Filled 2021-03-16: qty 1

## 2021-03-16 MED ORDER — HYDROCODONE-ACETAMINOPHEN 5-325 MG PO TABS
1.0000 | ORAL_TABLET | Freq: Once | ORAL | Status: AC
  Administered 2021-03-16: 1 via ORAL
  Filled 2021-03-16: qty 1

## 2021-03-16 MED ORDER — ACETAMINOPHEN 650 MG RE SUPP: 650.0000 mg | Freq: Four times a day (QID) | RECTAL | Status: DC | PRN

## 2021-03-16 MED ORDER — VANCOMYCIN HCL 1500 MG/300ML IV SOLN
1500.0000 mg | Freq: Once | INTRAVENOUS | Status: AC
  Administered 2021-03-16: 1500 mg via INTRAVENOUS
  Filled 2021-03-16: qty 300

## 2021-03-16 MED ORDER — CEFAZOLIN SODIUM-DEXTROSE 2-4 GM/100ML-% IV SOLN
2.0000 g | INTRAVENOUS | Status: DC
  Administered 2021-03-18: 2 g via INTRAVENOUS
  Filled 2021-03-16: qty 100

## 2021-03-16 MED ORDER — SODIUM CHLORIDE 0.9 % IV SOLN: 250.0000 mL | INTRAVENOUS | Status: DC | PRN

## 2021-03-16 MED ORDER — CITALOPRAM HYDROBROMIDE 20 MG PO TABS
40.0000 mg | ORAL_TABLET | Freq: Every day | ORAL | Status: DC
Start: 2021-03-17 — End: 2021-03-19
  Administered 2021-03-17 – 2021-03-19 (×3): 40 mg via ORAL
  Filled 2021-03-16 (×3): qty 2

## 2021-03-16 MED ORDER — BISACODYL 10 MG RE SUPP: 10.0000 mg | Freq: Every day | RECTAL | Status: DC | PRN

## 2021-03-16 MED ORDER — HEPARIN SODIUM (PORCINE) 5000 UNIT/ML IJ SOLN
5000.0000 [IU] | Freq: Three times a day (TID) | INTRAMUSCULAR | Status: DC
  Administered 2021-03-16 – 2021-03-18 (×5): 5000 [IU] via SUBCUTANEOUS
  Filled 2021-03-16 (×6): qty 1

## 2021-03-16 MED ORDER — POLYETHYLENE GLYCOL 3350 17 G PO PACK: 17.0000 g | PACK | Freq: Every day | ORAL | Status: DC | PRN

## 2021-03-16 MED ORDER — ADULT MULTIVITAMIN W/MINERALS CH
1.0000 | ORAL_TABLET | Freq: Every day | ORAL | Status: DC
  Administered 2021-03-16 – 2021-03-19 (×4): 1 via ORAL
  Filled 2021-03-16 (×9): qty 1

## 2021-03-16 MED ORDER — CEFAZOLIN SODIUM-DEXTROSE 2-4 GM/100ML-% IV SOLN
2.0000 g | INTRAVENOUS | Status: AC
  Administered 2021-03-17: 2 g via INTRAVENOUS
  Filled 2021-03-16: qty 100

## 2021-03-16 MED ORDER — ONDANSETRON HCL 4 MG PO TABS
4.0000 mg | ORAL_TABLET | Freq: Four times a day (QID) | ORAL | Status: DC | PRN
  Administered 2021-03-18: 4 mg via ORAL
  Filled 2021-03-16 (×2): qty 1

## 2021-03-16 MED ORDER — CALCITRIOL 0.25 MCG PO CAPS
0.7500 ug | ORAL_CAPSULE | Freq: Once | ORAL | Status: AC
  Administered 2021-03-17: 0.75 ug via ORAL
  Filled 2021-03-16: qty 3

## 2021-03-16 NOTE — ED Notes (Signed)
Pt's legal caregiver states he needs dialysis today  ?

## 2021-03-16 NOTE — Progress Notes (Signed)
I was told this pt is being admitted for [facility] bacteremia  ? ?He is ESRD and today is his day ? ?Orders are [facility]-  mwf 3 hours and 45 minutes ?EDW 78.5  2K/2.5 calc  400 BFR/500 DFR ?Left AVF-  15 gauge-  no heparin ?0.75 calcitriol q tx and 50 venofer weekly -  due on Monday  ? ?I have contacted Levada Dy, the dialysis RN who will not be able to run him today so plan will be for HD tomorrow  ? ?Full consult tomorrow as well  ? ?Louis Meckel ? ?

## 2021-03-16 NOTE — Progress Notes (Signed)
Pharmacy Antibiotic Note ? ?Paul Gonzalez is a 56 y.o. male admitted on 03/16/2021 with  MSSA bacteremia .  Pharmacy has been consulted for Cefazolin dosing. ? ?Plan: ?Cefazolin 2000 mg IV every MWF w/ HD. ?Monitor labs, c/s, and patient improvement. ? ?Height: '5\' 8"'$  (172.7 cm) ?Weight: 77.8 kg (171 lb 9.6 oz) ?IBW/kg (Calculated) : 68.4 ? ?Temp (24hrs), Avg:99.8 ?F (37.7 ?C), Min:98.3 ?F (36.8 ?C), Max:102.3 ?F (39.1 ?C) ? ?Recent Labs  ?Lab 03/15/21 ?1650 03/15/21 ?1936 03/16/21 ?1209  ?WBC 12.7*  --  9.3  ?CREATININE 8.68*  --  9.93*  ?LATICACIDVEN 1.9 1.1  --   ?  ?Estimated Creatinine Clearance: 8.1 mL/min (A) (by C-G formula based on SCr of 9.93 mg/dL (H)).   ? ?No Known Allergies ? ?Antimicrobials this admission: ?Cefazolin 3/14 >> ?Vanco 3/13  ? ?Microbiology results: ?3/12 BCx: gram + cocci ?BCID: MSSA ? ? ?Thank you for allowing pharmacy to be a part of this patient?s care. ? ?Ramond Craver ?03/16/2021 3:05 PM ? ?

## 2021-03-16 NOTE — Progress Notes (Signed)
? ? ? ? ?  INFECTIOUS DISEASE ATTENDING ADDENDUM: ? ? ?Date: 03/16/2021 ? ?Patient name: Paul Gonzalez  ?Medical record number: [id number]  ?Date of birth: 1965-09-18  ? ?NOTE I CAN PROVIDER A FORMAL CONSULT IF I HAVE VIDEO FEED TO EXAMINE AND DISCUSS CASE WITH THE PATIENT. ABSENT THAT I WILL ENTER NOTE WITH ADVICE ?    ?[facility] Staphylococcus aureus bacteremia ?  ?Staphylococcus aureus bacteremia (SAB) is associated with a high rate of complications and mortality.  Specific aspects of clinical management are critical to optimizing the outcome of patients with SAB.  Therefore, the [facility] Antimicrobial Management Team [facility]) has initiated an intervention aimed at improving the management of SAB at [facility].  To do so, Infectious Diseases physicians are providing an evidence-based consult for the management of all patients with SAB.  ?  ? Yes No Comments  ?Perform follow-up blood cultures (even if the patient is afebrile) to ensure clearance of bacteremia '[x]'$  '[]'$  Repeat blood cultures  ?Remove vascular catheter and obtain follow-up blood cultures after the removal of the catheter '[x]'$  '[]'$  NO HD CATHETER IN PLACE  ?Perform echocardiography to evaluate for endocarditis (transthoracic ECHO is 40-50% sensitive, TEE is > 90% sensitive) '[]'$  '[]'$  CHECK 2 D ECHO AND WILL NEED TEE  ?Consult electrophysiologist to evaluate implanted cardiac device (pacemaker, ICD) '[]'$  '[]'$    ?Ensure source control '[]'$  '[]'$  Have all abscesses been drained effectively? ?Have deep seeded infections (septic joints or osteomyelitis) had appropriate surgical debridement? ? ?Not clear   ?Investigate for ?metastatic? sites of infection '[]'$  '[]'$  Does the patient have ANY symptom or physical exam finding that would suggest a deeper infection (back or neck pain that may be suggestive of vertebral osteomyelitis or epidural abscess, muscle pain that could be a symptom of pyomyositis)? ? ?Keep in mind that for deep seeded infections  MRI imaging with contrast is preferred rather than other often insensitive tests such as plain x-rays, especially early in a patient's presentation. ? ?Not clear yet  ?Change antibiotic therapy to cefazolin '[x]'$  '[]'$  Beta-lactam antibiotics are preferred for MSSA due to higher cure rates.   ?If on Vancomycin, goal trough should be 15 - 20 mcg/mL  ?Estimated duration of IV antibiotic therapy:  6 weeks '[]'$  '[]'$  Consult case management for probably prolonged outpatient IV antibiotic therapy  ? ? ? ? ?Rhina Brackett Dam ?03/16/2021, 7:18 PM ? ?
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
## 2021-03-16 NOTE — ED Triage Notes (Signed)
Pt was here yesterday and received a call today for abnormal cultures/ lab work per legal caregiver  ?

## 2021-03-16 NOTE — ED Provider Notes (Cosign Needed)
[facility] EMERGENCY DEPARTMENT Provider Note   CSN: [id number] Arrival date & time: 03/16/21  7253     History  Chief Complaint  Patient presents with   abnormal lab work     Paul Gonzalez is a 56 y.o. male.  HPI     Paul Gonzalez is a 56 y.o. male with past medical history of end-stage renal disease on hemodialysis Monday Wednesday Friday, hypertension and is legally blind.  He was contacted this morning by someone from the hospital stating that he needed to return due to abnormal blood work.  He was seen in this emergency department yesterday for nausea vomiting and diarrhea.  Symptoms began 2 days ago.  No known sick contacts.  He has not recently taken any antibiotics.  He denies any cough, chest pain, or shortness of breath.  Some diffuse lower abdominal pain since the diarrhea occurred.  He had reassuring CT of the abdomen pelvis yesterday.  Patient's spouse states that diarrhea has been significant and mostly watery stool, she also states he felt warm this morning and was given 1000 mg of Tylenol prior to arrival.  He denies melena or hematochezia.  Home Medications Prior to Admission medications   Medication Sig Start Date End Date Taking? Authorizing Provider  atorvastatin (LIPITOR) 20 MG tablet Take 1 tablet by mouth once daily 02/12/21  Yes Cook, Jayce G, DO  calcium acetate (PHOSLO) 667 MG capsule Take 667 mg by mouth 3 (three) times daily with meals. 10/05/20  Yes [provider]  citalopram (CELEXA) 40 MG tablet Take 1 tablet (40 mg total) by mouth daily. 10/06/20  Yes Kathyrn Drown, MD  fenofibrate (TRICOR) 145 MG tablet Take 1 tablet (145 mg total) by mouth daily. 12/09/20 03/16/21 Yes Cook, Jayce G, DO  furosemide (LASIX) 80 MG tablet Take 80 mg by mouth daily.   Yes [provider]  insulin NPH Human (NOVOLIN N) 100 UNIT/ML injection 10 units qhs Patient taking differently: Inject 10-25 Units into the skin at bedtime. 04/27/18  Yes Mikey Kirschner, MD  Insulin Syringe-Needle U-100 (BD VEO INSULIN SYRINGE U/F) 31G X 15/64" 0.3 ML MISC USE AS DIRECTED 04/04/20  Yes Lovena Le, Malena M, DO  loratadine (CLARITIN) 10 MG tablet Take 1 tablet (10 mg total) by mouth daily. 10/06/20  Yes Kathyrn Drown, MD  Multiple Vitamin (MULTIVITAMIN) tablet Take 1 tablet by mouth daily.   Yes [provider]  ondansetron (ZOFRAN-ODT) 4 MG disintegrating tablet Take 1 tablet (4 mg total) by mouth every 8 (eight) hours as needed for nausea or vomiting. 03/15/21  Yes Hayden Rasmussen, MD  sevelamer carbonate (RENVELA) 800 MG tablet Take 1,600 mg by mouth 3 (three) times daily with meals. 10/15/20  Yes [provider]      Allergies    Patient has no known allergies.    Review of Systems   Review of Systems  Constitutional:  Negative for activity change, appetite change and fever.  Respiratory:  Negative for cough, chest tightness and wheezing.   Gastrointestinal:  Positive for abdominal pain, diarrhea, nausea and vomiting. Negative for blood in stool.  Neurological:  Negative for dizziness, weakness and numbness.  Psychiatric/Behavioral:  Negative for confusion.   All other systems reviewed and are negative.  Physical Exam Updated Vital Signs BP 127/66 (BP Location: Right Arm)    Pulse 84    Temp 98.3 F (36.8 C) (Oral)    Resp 18    Ht '5\' 8"'$  (1.727 m)  Wt 79.8 kg    SpO2 99%    BMI 26.76 kg/m  Physical Exam Vitals and nursing note reviewed.  Constitutional:      Appearance: Normal appearance. He is not toxic-appearing.  HENT:     Mouth/Throat:     Mouth: Mucous membranes are moist.  Cardiovascular:     Rate and Rhythm: Normal rate and regular rhythm.     Pulses: Normal pulses.     Comments: Fistula and left arm with positive thrill Pulmonary:     Effort: Pulmonary effort is normal. No respiratory distress.     Breath sounds: Normal breath sounds. No wheezing.  Abdominal:     General: There is no distension.     Palpations:  Abdomen is soft.     Tenderness: There is abdominal tenderness. There is no guarding.     Comments: Mild tenderness to palpation of the lower abdomen.  No guarding or rebound tenderness abdomen is soft.  No ascites.  Musculoskeletal:     Right lower leg: No edema.     Left lower leg: No edema.  Skin:    General: Skin is warm.     Capillary Refill: Capillary refill takes less than 2 seconds.     Findings: No rash.  Neurological:     General: No focal deficit present.     Mental Status: He is alert.     Sensory: No sensory deficit.     Motor: No weakness.    ED Results / Procedures / Treatments   Labs (all labs ordered are listed, but only abnormal results are displayed) Labs Reviewed  CBC WITH DIFFERENTIAL/PLATELET - Abnormal; Notable for the following components:      Result Value   RBC 3.17 (*)    Hemoglobin 10.2 (*)    HCT 30.5 (*)    Platelets 81 (*)    Neutro Abs 8.7 (*)    Lymphs Abs 0.2 (*)    All other components within normal limits  RESP PANEL BY RT-PCR (FLU A&B, COVID) ARPGX2  C DIFFICILE QUICK SCREEN W PCR REFLEX    COMPREHENSIVE METABOLIC PANEL    EKG None  Radiology CT Abdomen Pelvis Wo Contrast  Result Date: 03/15/2021 CLINICAL DATA:  Abdominal pain. Diarrhea and vomiting since yesterday. On dialysis. EXAM: CT ABDOMEN AND PELVIS WITHOUT CONTRAST TECHNIQUE: Multidetector CT imaging of the abdomen and pelvis was performed following the standard protocol without IV contrast. RADIATION DOSE REDUCTION: This exam was performed according to the departmental dose-optimization program which includes automated exposure control, adjustment of the mA and/or kV according to patient size and/or use of iterative reconstruction technique. COMPARISON:  03/02/2013 FINDINGS: Lower chest: Clear lung bases. Normal heart size without pericardial or pleural effusion. Right coronary artery calcification. Aortic valve calcification. Suspect mild distal esophageal wall thickening.  Hepatobiliary: Normal liver. Mild gallbladder distension, without calcified stone or specific evidence of acute cholecystitis. Pancreas: Normal, without mass or ductal dilatation. Spleen: Normal in size, without focal abnormality. Adrenals/Urinary Tract: Normal adrenal glands. Mild renal cortical thinning bilaterally. Multiple bilateral renal collecting system calculi. Maximally approximately 4 mm. No hydronephrosis. No hydroureter or ureteric calculi. Equivocal punctate stone at the right ureterovesicular junction on coronal image 72 and sagittal image 65. Stomach/Bowel: Normal stomach, without wall thickening. Normal colon, appendix, and terminal ileum. Normal small bowel. Vascular/Lymphatic: Aortic atherosclerosis. No abdominopelvic adenopathy. Reproductive: Normal prostate. Other: No significant free fluid.  No free intraperitoneal air. Musculoskeletal: No acute osseous abnormality. IMPRESSION: 1.  No acute process in the  abdomen or pelvis. 2. Bilateral nephrolithiasis. Equivocal punctate stone at the right ureterovesicular junction. Correlate with right lower quadrant symptoms and urinalysis. 3. Coronary artery atherosclerosis. Aortic Atherosclerosis (ICD10-I70.0). 4. Aortic valvular calcifications. Consider echocardiography to evaluate for valvular dysfunction. 5. Possible mild esophageal wall thickening as can be seen with esophagitis. Electronically Signed   By: Abigail Miyamoto M.D.   On: 03/15/2021 18:48    Procedures Procedures    Medications Ordered in ED Medications  vancomycin (VANCOREADY) IVPB 1500 mg/300 mL (1,500 mg Intravenous New Bag/Given 03/16/21 1228)  HYDROcodone-acetaminophen (NORCO/VICODIN) 5-325 MG per tablet 1 tablet (1 tablet Oral Given 03/16/21 1219)    ED Course/ Medical Decision Making/ A&P                           Medical Decision Making Patient called this morning to return to the emergency department for evaluation of positive blood cultures.  Seen here yesterday and  evaluated for nausea vomiting and diarrhea.  No melena or hematochezia.  Subjective fever at home this morning was given Tylenol prior to arrival  On my exam, patient is nontoxic-appearing.  He is afebrile here and his vital signs are reassuring.  He had labs yesterday, lactic acid was unremarkable, mild leukocytosis of 12,000.  CT of the abdomen and pelvis from yesterday without acute findings.  Amount and/or Complexity of Data Reviewed Labs: ordered.    Details: Labs today without evidence of leukocytosis.  Chemistries and C. difficile PCR panel pending.  COVID and influenza test today also pending.  Laboratory results from yesterday reviewed by me. Radiology:  Decision-making details documented in ED Course. Discussion of management or test interpretation with external provider(s): Discussed findings with [facility] hospitalist, Dr. Maurene Capes.  Patient will need hospital admission for IV antibiotics for likely bacteremia.  Blood cultures from yesterday grew gram positive cocci.  Felt to be Enterococcus  I consulted lab regarding blood cultures,I was informed that all culture tubes were positive for presence of gram-positive cocci, sent to [facility] this morning for sensitivity.   I have consulted pharmacy who recommends to start patient on 1.5 g of vancomycin.  He is hemodialysis patient, last dialyzed on Friday, he typically is dialyzed on Monday Wednesday Fridays.  Will need dialysis during his hospital admission.  Risk Prescription drug management. Decision regarding hospitalization.           Final Clinical Impression(s) / ED Diagnoses Final diagnoses:  Gram-positive bacteremia    Rx / DC Orders ED Discharge Orders     None         Kem Parkinson, PA-C 03/16/21 1315

## 2021-03-17 ENCOUNTER — Inpatient Hospital Stay (HOSPITAL_COMMUNITY): Payer: Medicare Other

## 2021-03-17 DIAGNOSIS — R7881 Bacteremia: Secondary | ICD-10-CM

## 2021-03-17 DIAGNOSIS — B9561 Methicillin susceptible Staphylococcus aureus infection as the cause of diseases classified elsewhere: Secondary | ICD-10-CM | POA: Diagnosis not present

## 2021-03-17 LAB — COMPREHENSIVE METABOLIC PANEL
ALT: 81 U/L — ABNORMAL HIGH (ref 0–44)
AST: 70 U/L — ABNORMAL HIGH (ref 15–41)
Albumin: 2.9 g/dL — ABNORMAL LOW (ref 3.5–5.0)
Alkaline Phosphatase: 36 U/L — ABNORMAL LOW (ref 38–126)
Anion gap: 17 — ABNORMAL HIGH (ref 5–15)
BUN: 118 mg/dL — ABNORMAL HIGH (ref 6–20)
CO2: 19 mmol/L — ABNORMAL LOW (ref 22–32)
Calcium: 8.7 mg/dL — ABNORMAL LOW (ref 8.9–10.3)
Chloride: 96 mmol/L — ABNORMAL LOW (ref 98–111)
Creatinine, Ser: 10.93 mg/dL — ABNORMAL HIGH (ref 0.61–1.24)
GFR, Estimated: 5 mL/min — ABNORMAL LOW (ref >60)
Glucose, Bld: 171 mg/dL — ABNORMAL HIGH (ref 70–99)
Potassium: 3.6 mmol/L (ref 3.5–5.1)
Sodium: 132 mmol/L — ABNORMAL LOW (ref 135–145)
Total Bilirubin: 1 mg/dL (ref 0.3–1.2)
Total Protein: 6.1 g/dL — ABNORMAL LOW (ref 6.5–8.1)

## 2021-03-17 LAB — CBC
HCT: 26.9 % — ABNORMAL LOW (ref 39.0–52.0)
Hemoglobin: 9.1 g/dL — ABNORMAL LOW (ref 13.0–17.0)
MCH: 32.3 pg (ref 26.0–34.0)
MCHC: 33.8 g/dL (ref 30.0–36.0)
MCV: 95.4 fL (ref 80.0–100.0)
Platelets: 91 10*3/uL — ABNORMAL LOW (ref 150–400)
RBC: 2.82 MIL/uL — ABNORMAL LOW (ref 4.22–5.81)
RDW: 13.3 % (ref 11.5–15.5)
WBC: 10 10*3/uL (ref 4.0–10.5)
nRBC: 0 % (ref 0.0–0.2)

## 2021-03-17 LAB — GASTROINTESTINAL PANEL BY PCR, STOOL (REPLACES STOOL CULTURE)

## 2021-03-17 LAB — ECHOCARDIOGRAM COMPLETE
AR max vel: 0.82 cm2
AV Area VTI: 1.22 cm2
AV Area mean vel: 1.17 cm2
AV Mean grad: 30 mmHg
AV Peak grad: 61.5 mmHg
Ao pk vel: 3.92 m/s
Area-P 1/2: 3.77 cm2
Height: 68 in
MV VTI: 2.3 cm2
S' Lateral: 2.9 cm
Weight: 2733.7 oz

## 2021-03-17 LAB — HEPATITIS PANEL, ACUTE
HCV Ab: NONREACTIVE
Hep A IgM: NONREACTIVE
Hep B C IgM: NONREACTIVE
Hepatitis B Surface Ag: NONREACTIVE

## 2021-03-17 LAB — GLUCOSE, CAPILLARY
Glucose-Capillary: 164 mg/dL — ABNORMAL HIGH (ref 70–99)
Glucose-Capillary: 101 mg/dL — ABNORMAL HIGH (ref 70–99)
Glucose-Capillary: 158 mg/dL — ABNORMAL HIGH (ref 70–99)
Glucose-Capillary: 134 mg/dL — ABNORMAL HIGH (ref 70–99)

## 2021-03-17 LAB — HEPATITIS B SURFACE ANTIGEN: Hepatitis B Surface Ag: NONREACTIVE

## 2021-03-17 LAB — HIV ANTIBODY (ROUTINE TESTING W REFLEX): HIV Screen 4th Generation wRfx: NONREACTIVE

## 2021-03-17 LAB — HEMOGLOBIN A1C
Hgb A1c MFr Bld: 6.6 % — ABNORMAL HIGH (ref 4.8–5.6)
Mean Plasma Glucose: 143 mg/dL

## 2021-03-17 LAB — C DIFFICILE QUICK SCREEN W PCR REFLEX
C Diff antigen: NEGATIVE
C Diff interpretation: NOT DETECTED
C Diff toxin: NEGATIVE

## 2021-03-17 LAB — HEPATITIS B SURFACE ANTIBODY,QUALITATIVE: Hep B S Ab: REACTIVE — AB

## 2021-03-17 MED ORDER — ACETAMINOPHEN 325 MG PO TABS
ORAL_TABLET | ORAL | Status: AC
  Administered 2021-03-17: 650 mg via ORAL
  Filled 2021-03-17: qty 2

## 2021-03-17 MED ORDER — LIDOCAINE HCL (PF) 1 % IJ SOLN: 5.0000 mL | INTRAMUSCULAR | Status: DC | PRN

## 2021-03-17 MED ORDER — SODIUM CHLORIDE 0.9 % IV SOLN: 100.0000 mL | INTRAVENOUS | Status: DC | PRN

## 2021-03-17 MED ORDER — LIDOCAINE-PRILOCAINE 2.5-2.5 % EX CREA: 1.0000 "application " | TOPICAL_CREAM | CUTANEOUS | Status: DC | PRN

## 2021-03-17 MED ORDER — PENTAFLUOROPROP-TETRAFLUOROETH EX AERO: 1.0000 "application " | INHALATION_SPRAY | CUTANEOUS | Status: DC | PRN

## 2021-03-17 NOTE — Progress Notes (Signed)
?PROGRESS NOTE ? ? ? ? ?Paul Gonzalez, is a 56 y.o. male, DOB - [birth year], OAC:[id number] ? ?Admit date - 03/16/2021   Admitting Physician Lynnet Hefley Denton Brick, MD ? ?Outpatient Primary MD for the patient is Coral Spikes, DO ? ?LOS - 1 ? ?Chief Complaint  ?Patient presents with  ? abnormal lab work   ?    ? ? ?Brief Narrative:  ?56 y.o. male with past medical history relevant for type 2 Diabetes, HTN, HLD, ESRD on HD MWF, diabetic retinopathy with vision loss, anemia of ESRD admitted on 03/16/2021 with nausea vomiting and diarrhea as well as fatigue and weakness and found to have MSSA bacteremia in 4 out of 4 bottles ? ?  ?-Assessment and Plan: ? ? ?1) MSSA bacteremia--- review of records shows that patient did have a fever of 102.3 while in the ED on 03/15/2021 ?-blood cultures from 03/15/2021 were positive for MSSA in 4 out of 4 bottles ?-Repeat blood cultures from 03/16/2021 also appears to be positive for GPC ?-Patient received IV vancomycin in the ED ?-Discussed with ID pharmacist Mr. Heide Guile and ID physician Dr. Lucianne Lei- Dam--- recommends IV Ancef post HD hemodialysis session ?-Plan is to repeat blood cultures on 03/18/2021 after hemodialysis  ?-Echocardiogram pending ?- May need TEE ?-WBC is down to 10.0 from 12.3 the day before ?  ?2) nausea vomiting and diarrhea-- ?-associated with fatigue, weakness ?-Diarrhea was without blood or mucus ?-No sick contacts at home, no recent antibiotic use, felt warm but no documented fever showed hours ?-CT abdomen and pelvis from 03/15/2021 without acute findings ?-Stool for C. difficile negative, GI pathogen/stool culture pending ?  ?3) chronic anemia of ESRD--- hemoglobin around 9 ?-No bleeding concerns at this time ?-EPO/ESA agent per nephrology team ?-No concerns about ongoing bleeding at this time ?  ?4) thrombocytopenia--- acute versus chronic, may be related to bacteremia/infection ?-Monitor closely, no evidence of ongoing bleeding at this time ?-Consider stopping  prophylactic subcu heparin if platelets trend down further ?  ?5) ESRD--- HD Monday Wednesday Friday--per nephrology team dialysis deferred to 03/17/2021 ?-Patient has been having diarrhea and does not appear to be volume overloaded at this time ?-Potassium is 4.1 ?  ?6)Elevated LFTs--- CT abdomen and pelvis from 03/15/2021 without acute findings, repeat LFTs in a.m.  ?-hepatitis profile negative for hep A, hep B or hep C ?-get right upper quadrant ultrasound ?  ?7)DM2-no recent A1c available ?-c/n Lantus insulin, ?Use Novolog/Humalog Sliding scale insulin with Accu-Cheks/Fingersticks as ordered  ?  ?8)HTN-IV hydralazine as needed elevated BP ?  ?Disposition/Need for in-Hospital Stay- patient unable to be discharged at this time due to --- MSSA bacteremia requiring IV antibiotics and further work-up to rule out endocarditis ?-Possible discharge home on 03/18/2021 after hemodialysis and after repeat blood cultures with plans to get IV Ancef post hemodialysis on Mondays Wednesdays Fridays as outpatient ?  ?Status is: Inpatient ?  ?Remains inpatient appropriate because:  ?  ?Dispo: The patient is from: Home ?             Anticipated d/c is to: Home ?             Anticipated d/c date is: 1 day--Possible discharge home on 03/18/2021 after hemodialysis and after repeat blood cultures with plans to get IV Ancef post hemodialysis on Mondays Wednesdays Fridays as outpatient ?             Patient currently is not medically stable to d/c. ?Barriers: Not Clinically Stable-  ? ?  Code Status : -  Code Status: Full Code  ? ?Family Communication:  (patient is alert, awake and coherent) Discussed with wife at bedside ? ?DVT Prophylaxis  :   - SCDs   heparin injection 5,000 Units Start: 03/16/21 2200 ?SCDs Start: 03/16/21 1811 ?Place TED hose Start: 03/16/21 1811 ? ? ?Lab Results  ?Component Value Date  ? PLT 91 (L) 03/17/2021  ? ?Inpatient Medications ? ?Scheduled Meds: ? acetaminophen      ? atorvastatin  20 mg Oral q1800  ? calcitRIOL   0.75 mcg Oral Once in dialysis  ? Chlorhexidine Gluconate Cloth  6 each Topical Q0600  ? citalopram  40 mg Oral Daily  ? fenofibrate  160 mg Oral Daily  ? heparin  5,000 Units Subcutaneous Q8H  ? insulin aspart  0-5 Units Subcutaneous QHS  ? insulin aspart  0-6 Units Subcutaneous TID WC  ? insulin glargine-yfgn  10 Units Subcutaneous QHS  ? multivitamin with minerals  1 tablet Oral Daily  ? sodium chloride flush  3 mL Intravenous Q12H  ? sodium chloride flush  3 mL Intravenous Q12H  ? ?Continuous Infusions: ? sodium chloride    ? sodium chloride    ? sodium chloride    ? [START ON 03/18/2021]  ceFAZolin (ANCEF) IV    ?  ceFAZolin (ANCEF) IV 2 g (03/17/21 1236)  ? ?PRN Meds:.sodium chloride, sodium chloride, sodium chloride, acetaminophen **OR** acetaminophen, alum & mag hydroxide-simeth, bisacodyl, hydrALAZINE, lidocaine (PF), lidocaine-prilocaine, ondansetron **OR** ondansetron (ZOFRAN) IV, pentafluoroprop-tetrafluoroeth, polyethylene glycol, sodium chloride flush ? ? ?Anti-infectives (From admission, onward)  ? ? Start     Dose/Rate Route Frequency Ordered Stop  ? 03/18/21 1600  ceFAZolin (ANCEF) IVPB 2g/100 mL premix       ? 2 g ?200 mL/hr over 30 Minutes Intravenous Every M-W-F (Hemodialysis) 03/16/21 1509    ? 03/17/21 1200  ceFAZolin (ANCEF) IVPB 2g/100 mL premix       ? 2 g ?200 mL/hr over 30 Minutes Intravenous Every T-Th-Sa (Hemodialysis) 03/16/21 1509 03/19/21 1159  ? 03/16/21 1200  vancomycin (VANCOREADY) IVPB 1500 mg/300 mL       ? 1,500 mg ?150 mL/hr over 120 Minutes Intravenous  Once 03/16/21 1145 03/16/21 1428  ? ?  ? ?Subjective: ?Paul Gonzalez today has no fevers, no emesis,  No chest pain,   ?-Complains of fatigue and generalized weakness ?-Tolerating hemodialysis well on 03/17/2021 ?-Wife at bedside ? ?Objective: ?Vitals:  ? 03/17/21 1130 03/17/21 1200 03/17/21 1230 03/17/21 1245  ?BP: 118/63 122/61 102/61 100/60  ?Pulse: 84 79 81 80  ?Resp:      ?Temp:      ?TempSrc:      ?SpO2:      ?Weight:       ?Height:      ? ? ?Intake/Output Summary (Last 24 hours) at 03/17/2021 1259 ?Last data filed at 03/17/2021 1245 ?Gross per 24 hour  ?Intake 646.06 ml  ?Output 539 ml  ?Net 107.06 ml  ? ?Filed Weights  ? 03/16/21 0946 03/16/21 1418 03/17/21 0900  ?Weight: 79.8 kg 77.8 kg 77.5 kg  ? ?Physical Exam ? ?Gen:- Awake Alert, ill but not toxic appearing ?HEENT:--Legally blind ?Neck-Supple Neck,No JVD,.  ?Lungs-  CTAB , fair symmetrical air movement ?CV- S1 and S2 normal, regular, ??  Heart murmur but this appears to be transmitted sounds from patient's left arm fistula  ?Abd-  +ve B.Sounds, Abd Soft, No tenderness,    ?Extremity/Skin:- No  edema, pedal pulses present  ?  Psych-affect is appropriate, oriented x3 ?Neuro-no new focal deficits, no tremors ?-MSK-left upper extremity AV fistula positive thrill and bruit ? ?Data Reviewed: I have personally reviewed following labs and imaging studies ? ?CBC: ?Recent Labs  ?Lab 03/15/21 ?1650 03/16/21 ?1209 03/17/21 ?8177  ?WBC 12.7* 9.3 10.0  ?NEUTROABS  --  8.7*  --   ?HGB 11.1* 10.2* 9.1*  ?HCT 33.3* 30.5* 26.9*  ?MCV 96.5 96.2 95.4  ?PLT 106* 81* 91*  ? ?Basic Metabolic Panel: ?Recent Labs  ?Lab 03/15/21 ?1650 03/16/21 ?1209 03/17/21 ?1165  ?NA 129* 131* 132*  ?K 3.8 4.1 3.6  ?CL 90* 93* 96*  ?CO2 22 20* 19*  ?GLUCOSE 276* 296* 171*  ?BUN 66* 98* 118*  ?CREATININE 8.68* 9.93* 10.93*  ?CALCIUM 9.1 8.9 8.7*  ? ?GFR: ?Estimated Creatinine Clearance: 7.4 mL/min (A) (by C-G formula based on SCr of 10.93 mg/dL (H)). ?Liver Function Tests: ?Recent Labs  ?Lab 03/15/21 ?1650 03/16/21 ?1209 03/17/21 ?7903  ?AST 69* 108* 70*  ?ALT 50* 79* 81*  ?ALKPHOS 43 39 36*  ?BILITOT 0.8 1.0 1.0  ?PROT 7.6 6.8 6.1*  ?ALBUMIN 3.8 3.3* 2.9*  ? ?Cardiac Enzymes: ?No results for input(s): CKTOTAL, CKMB, CKMBINDEX, TROPONINI in the last 168 hours. ?BNP (last 3 results) ?No results for input(s): PROBNP in the last 8760 hours. ?HbA1C: ?No results for input(s): HGBA1C in the last 72 hours. ?Sepsis  Labs: ?'@LABRCNTIP'$ (procalcitonin:4,lacticidven:4) ?) ?Recent Results (from the past 240 hour(s))  ?Resp Panel by RT-PCR (Flu A&B, Covid) Nasopharyngeal Swab     Status: None  ? Collection Time: 03/15/21  4:58 PM  ? Specimen

## 2021-03-17 NOTE — Progress Notes (Incomplete)
*  PRELIMINARY RESULTS* ?Echocardiogram ?2D Echocardiogram has been performed. ? ?Paul Gonzalez ?03/17/2021, 3:12 PM ?

## 2021-03-17 NOTE — Procedures (Signed)
? ?  HEMODIALYSIS TREATMENT NOTE: ? ? ?3.5 hour heparin-free session completed using left forearm AVF (15g/antegrade). Goal NOT met.  Pt was 1 kg below his EDW before we started.  Soft BPs limited UF efforts.  Net UF 539 cc.  ? ?Afebrile, but +chills, "hurting all over".  Acetaminophen given at 1300. Cefazolin 2g given over the last 30 minutes of HD. ? ?HD is off-schedule today.  Next session planned for tomorrow.  I'll coordinate antibiotic regimen with pharmacy at that time. ? ? ?Rockwell Alexandria, RN ?

## 2021-03-17 NOTE — Progress Notes (Signed)
?  Transition of Care (TOC) Screening Note ? ? ?Patient Details  ?Name: Paul Gonzalez ?Date of Birth: 01-07-1965 ? ? ?Transition of Care (TOC) CM/SW Contact:    ?Boneta Lucks, RN ?Phone Number: ?03/17/2021, 12:47 PM ? ?Pending work up, echo, [employer], needs HD tomorrow. ? ?Transition of Care Department [facility]) has reviewed patient and no TOC needs have been identified at this time. We will continue to monitor patient advancement through interdisciplinary progression rounds. If new patient transition needs arise, please place a TOC consult. ? ? ?

## 2021-03-17 NOTE — Progress Notes (Signed)
? ? ? ? ?  INFECTIOUS DISEASE ATTENDING ADDENDUM: ? ? ?Date: 03/17/2021 ? ?Patient name: Paul Gonzalez  ?Medical record number: [id number]  ?Date of birth: 11-25-1965  ? ?TTE done report not yet back ? ?Repeat blood cultures still positive  ? ?Will continue ancef ? ?Will need TEE ? ?If we d not clear his bactermia and his TEE fails to show endocarditis I would recommend foramal evaluation of his graft by [facility].  ? ? ?Doe she have any soft tissue site of infection that could be a source? ? ? ? ?Rhina Brackett Dam ?03/17/2021, 3:45 PM ? ? ?
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
## 2021-03-18 DIAGNOSIS — I10 Essential (primary) hypertension: Secondary | ICD-10-CM | POA: Diagnosis not present

## 2021-03-18 DIAGNOSIS — N186 End stage renal disease: Secondary | ICD-10-CM | POA: Diagnosis not present

## 2021-03-18 DIAGNOSIS — E11319 Type 2 diabetes mellitus with unspecified diabetic retinopathy without macular edema: Secondary | ICD-10-CM | POA: Diagnosis not present

## 2021-03-18 DIAGNOSIS — R7881 Bacteremia: Secondary | ICD-10-CM | POA: Diagnosis not present

## 2021-03-18 LAB — BASIC METABOLIC PANEL
Anion gap: 12 (ref 5–15)
BUN: 66 mg/dL — ABNORMAL HIGH (ref 6–20)
CO2: 27 mmol/L (ref 22–32)
Calcium: 8.4 mg/dL — ABNORMAL LOW (ref 8.9–10.3)
Chloride: 96 mmol/L — ABNORMAL LOW (ref 98–111)
Creatinine, Ser: 6.84 mg/dL — ABNORMAL HIGH (ref 0.61–1.24)
GFR, Estimated: 9 mL/min — ABNORMAL LOW (ref >60)
Glucose, Bld: 82 mg/dL (ref 70–99)
Potassium: 3.3 mmol/L — ABNORMAL LOW (ref 3.5–5.1)
Sodium: 135 mmol/L (ref 135–145)

## 2021-03-18 LAB — CULTURE, BLOOD (ROUTINE X 2)
Special Requests: ADEQUATE
Special Requests: ADEQUATE

## 2021-03-18 LAB — GLUCOSE, CAPILLARY
Glucose-Capillary: 76 mg/dL (ref 70–99)
Glucose-Capillary: 92 mg/dL (ref 70–99)
Glucose-Capillary: 138 mg/dL — ABNORMAL HIGH (ref 70–99)
Glucose-Capillary: 158 mg/dL — ABNORMAL HIGH (ref 70–99)

## 2021-03-18 LAB — CBC
HCT: 25.8 % — ABNORMAL LOW (ref 39.0–52.0)
Hemoglobin: 8.7 g/dL — ABNORMAL LOW (ref 13.0–17.0)
MCH: 32.1 pg (ref 26.0–34.0)
MCHC: 33.7 g/dL (ref 30.0–36.0)
MCV: 95.2 fL (ref 80.0–100.0)
Platelets: 92 10*3/uL — ABNORMAL LOW (ref 150–400)
RBC: 2.71 MIL/uL — ABNORMAL LOW (ref 4.22–5.81)
RDW: 13.4 % (ref 11.5–15.5)
WBC: 8.5 10*3/uL (ref 4.0–10.5)
nRBC: 0 % (ref 0.0–0.2)

## 2021-03-18 LAB — HEPATITIS B SURFACE ANTIBODY, QUANTITATIVE: Hep B S AB Quant (Post): 520.3 m[IU]/mL (ref >9.9)

## 2021-03-18 MED ORDER — POTASSIUM CHLORIDE CRYS ER 20 MEQ PO TBCR
40.0000 meq | EXTENDED_RELEASE_TABLET | Freq: Once | ORAL | Status: AC
  Administered 2021-03-18: 40 meq via ORAL
  Filled 2021-03-18: qty 2

## 2021-03-18 MED ORDER — OXYCODONE HCL 5 MG PO TABS
5.0000 mg | ORAL_TABLET | Freq: Three times a day (TID) | ORAL | Status: DC | PRN
  Administered 2021-03-18: 5 mg via ORAL
  Filled 2021-03-18: qty 1

## 2021-03-18 MED ORDER — DARBEPOETIN ALFA 100 MCG/0.5ML IJ SOSY
PREFILLED_SYRINGE | INTRAMUSCULAR | Status: AC
  Administered 2021-03-18: 100 ug via INTRAVENOUS
  Filled 2021-03-18: qty 0.5

## 2021-03-18 MED ORDER — DARBEPOETIN ALFA 100 MCG/0.5ML IJ SOSY
100.0000 ug | PREFILLED_SYRINGE | INTRAMUSCULAR | Status: DC
  Filled 2021-03-18: qty 0.5

## 2021-03-18 NOTE — TOC Progression Note (Signed)
Transition of Care (TOC) - Progression Note  ? ? ?Patient Details  ?Name: Paul Gonzalez ?MRN: [id number] ?Date of Birth: [birth year] ? ?Transition of Care (TOC) CM/SW Contact  ?Salome Arnt, LCSW ?Phone Number: ?03/18/2021, 2:48 PM ? ?Clinical Narrative: Anticipate d/c tomorrow. LCSW notified Betsy at [facility] that pt will require IV antibiotics at dialysis. She requested that D/C summary be faxed tomorrow to [phone].    ? ? ? ?  ?Barriers to Discharge: Continued Medical Work up ? ?Expected Discharge Plan and Services ?  ?  ?  ?  ?  ?                ?  ?  ?  ?  ?  ?  ?  ?  ?  ?  ? ? ?Social Determinants of Health (SDOH) Interventions ?  ? ?Readmission Risk Interventions ?No flowsheet data found. ? ?

## 2021-03-18 NOTE — Progress Notes (Signed)
? ? ? ? ?  INFECTIOUS DISEASE ATTENDING ADDENDUM: ? ? ?Date: 03/18/2021 ? ?Patient name: Paul Gonzalez  ?Medical record number: [id number]  ?Date of birth: 1965-12-24  ? ?Blood cultures from the 12th and 13 are positive those in the 14th and now drawn today are incubating. ? ?He has worsening aortic stenosis on transthoracic echocardiogram but no endocarditis on a limited study. ? ?He clearly needs a transesophageal echocardiogram. ? ?If his cultures remain persistently positive despite being on effective antibiotics would worry about his graft being infected. ? ?It is still not clear to me what the source of this is other than the fact that he is at risk for staphylococcal bacteremia having hemodialysis. ? ?I would treat him with 6 weeks of antibiotics with dialysis once he clears his infection. ? ? ?Rhina Brackett Dam ?03/18/2021, 7:29 PM ? ?

## 2021-03-18 NOTE — Procedures (Signed)
? ?  HEMODIALYSIS TREATMENT NOTE: ? ? ?UF limited by hypotension and soft pressures.  Appears euvolemic.  Kept even.  Surveillance BCx2 were drawn by phlebotomist via direct venipuncture from non-access arm - as per Dr. Tommy Medal.  Cefazolin 2g given during last 30 minutes of HD.  All blood was returned and hemostasis was achieved in 15 minutes.  No changes from pre-HD assessment.  Net UF 0. ? ? ?Rockwell Alexandria, RN ?

## 2021-03-18 NOTE — Progress Notes (Signed)
?[facility] ?Progress Note  ? ? ?Assessment/ Plan:   ?Assessment/Plan: 56 year old WM with longstanding DM-  blindness and ESRD presents with MSSA bacteremia  ?1 MSSA bacteremia-  in all 4 bottles-  on ancef, ID following.  Source unclear,  HD unit had denied any access issues and his fistula looks good-  still could have had some incidental bacteria introduction during HD.  TEE pending. If 3/14 bcx are persistently positive then will need to get VVS on board to take a look at the AVF as a potential source ?2 ESRD: normally MWF at [facility]- via AVF- s/p HD 3/14-off schedule. Plan is to get him back on schedule on HD today ?3 Hypertension/volume: BPs better. UF as tolerated. Under EDW--may need new EDW on d/c ?4. Anemia of ESRD: was only on iron at OP center-  hgb now 8.7-  will give ESA starting 3/15. Hold on iron in the setting of bacteremia  ?5. Metabolic Bone Disease: continue calcitriol -  hold renvela for now given GI distress ?6.  N/V/D-  has been ruled out for Cdiff-  has elevated LFTs but CT is negative-  per primary team-  antiemetics   ? ?Outpatient orders Dialyzes at [facility] MWF 3 hours and 45 minutes   EDW 78.5. ?HD Bath 2/2.5, Dialyzer 300, Heparin no. Access left AVF- 15 gauge-  400 BFR/500 DFR ?Meds: 0.75 calcitriol q treatment and venofer 50 weekly ? ?Subjective:   ?No acute events. Tolerated hd yesterday but was only able to UF ~0.5L. He still reports some generalized body aches. He reports that he does not have much fluid on.  ? ?Objective:   ?BP (!) 145/82 (BP Location: Right Arm)   Pulse 80   Temp 99.3 ?F (37.4 ?C) (Oral)   Resp 17   Ht '5\' 8"'$  (1.727 m)   Wt 77.5 kg   SpO2 98%   BMI 25.98 kg/m?  ? ?Intake/Output Summary (Last 24 hours) at 03/18/2021 0853 ?Last data filed at 03/17/2021 1831 ?Gross per 24 hour  ?Intake 300 ml  ?Output 539 ml  ?Net -239 ml  ? ?Weight change: -2.333 kg ? ?Physical Exam: ?Gen:nad, legally blind ?XMI:[id number], +systolic murmur ?Resp:cta  bl ?OZY:YQMG, nt ?Ext:no edema ?Neuro: awake, alert ?Dialysis access: LUE AVF +b/t ? ?Imaging: ?ECHOCARDIOGRAM COMPLETE ? ?Result Date: 03/17/2021 ?   ECHOCARDIOGRAM REPORT   Patient Name:   Paul Gonzalez Date of Exam: 03/17/2021 Medical Rec #:  [id number]       Height:       68.0 in Accession #:    [id number]      Weight:       170.9 lb Date of Birth:  09-18-1965       BSA:          1.911 m? Patient Age:    58 years        BP:           154/87 mmHg Patient Gender: M               HR:           99 bpm. Exam Location:  [facility] Procedure: 2D Echo, Cardiac Doppler and Color Doppler Indications:    Bacteremia  History:        Patient has prior history of Echocardiogram examinations, most                 recent 09/04/2019. Risk Factors:Hypertension, Diabetes and  Dyslipidemia. Patient is Blind. Test discontinued at patient                 requet due to pain.  Sonographer:    Wenda Low Referring Phys: [phone] CORNELIUS N VAN DAM  Sonographer Comments: Suboptimal apical window. IMPRESSIONS  1. Study stopped early due to patient discomfort.  2. Left ventricular ejection fraction, by estimation, is 60 to 65%. The left ventricle has normal function. The left ventricle has no regional wall motion abnormalities. There is moderate concentric left ventricular hypertrophy. Left ventricular diastolic parameters were normal.  3. Right ventricular systolic function is normal. The right ventricular size is normal. There is normal pulmonary artery systolic pressure.  4. Left atrial size was moderately dilated.  5. The mitral valve is abnormal. Trivial mitral valve regurgitation. Moderate mitral annular calcification.  6. The aortic valve has an indeterminant number of cusps but appears tricuspid. There is moderate calcification of the aortic valve. There is moderate thickening of the aortic valve. Aortic valve regurgitation is not visualized. Moderate aortic valve stenosis. AVA 1.0cm2 by continuity. Aortic valve mean  gradient 13mHg. Aortic valve Vmax measures 3.92 m/s. DI 0.28. Comparison(s): Compared to prior TTE in 08/2019, the aortic stenosis is now moderate (previously mild to moderate with mean gradient 177mg (now 309m)). Conclusion(s)/Recommendation(s): No evidence of valvular vegetations on this transthoracic echocardiogram. Consider a transesophageal echocardiogram to exclude infective endocarditis if clinically indicated. FINDINGS  Left Ventricle: Left ventricular ejection fraction, by estimation, is 60 to 65%. The left ventricle has normal function. The left ventricle has no regional wall motion abnormalities. The left ventricular internal cavity size was normal in size. There is  moderate concentric left ventricular hypertrophy. Left ventricular diastolic parameters were normal. Right Ventricle: The right ventricular size is normal. Right vetricular wall thickness was not well visualized. Right ventricular systolic function is normal. There is normal pulmonary artery systolic pressure. The tricuspid regurgitant velocity is 2.33 m/s, and with an assumed right atrial pressure of 8 mmHg, the estimated right ventricular systolic pressure is 29.67.3Hg. Left Atrium: Left atrial size was moderately dilated. Right Atrium: Right atrial size was normal in size. Pericardium: There is no evidence of pericardial effusion. Mitral Valve: The mitral valve is abnormal. There is moderate thickening of the mitral valve leaflet(s). There is moderate calcification of the mitral valve leaflet(s). Moderate mitral annular calcification. Trivial mitral valve regurgitation. MV peak gradient, 7.4 mmHg. The mean mitral valve gradient is 3.0 mmHg. Tricuspid Valve: The tricuspid valve is normal in structure. Tricuspid valve regurgitation is trivial. Aortic Valve: The aortic valve has an indeterminant number of cusps. There is moderate calcification of the aortic valve. There is moderate thickening of the aortic valve. Aortic valve  regurgitation is not visualized. Moderate aortic stenosis is present.  Aortic valve mean gradient measures 30.0 mmHg. Aortic valve peak gradient measures 61.5 mmHg. Aortic valve area, by VTI measures 1.22 cm?. Pulmonic Valve: The pulmonic valve was normal in structure. Pulmonic valve regurgitation is trivial. Aorta: The aortic root is normal in size and structure. IAS/Shunts: The atrial septum is grossly normal.  LEFT VENTRICLE PLAX 2D LVIDd:         4.90 cm   Diastology LVIDs:         2.90 cm   LV e' medial:    7.07 cm/s LV PW:         1.40 cm   LV E/e' medial:  17.3 LV IVS:        1.40 cm  LV e' lateral:   9.36 cm/s LVOT diam:     1.90 cm   LV E/e' lateral: 13.0 LV SV:         66 LV SV Index:   34 LVOT Area:     2.84 cm?  RIGHT VENTRICLE RV Basal diam:  3.85 cm RV Mid diam:    4.40 cm LEFT ATRIUM             Index        RIGHT ATRIUM           Index LA diam:        4.80 cm 2.51 cm/m?   RA Area:     19.40 cm? LA Vol (A2C):   69.1 ml 36.15 ml/m?  RA Volume:   52.60 ml  27.52 ml/m? LA Vol (A4C):   91.6 ml 47.92 ml/m? LA Biplane Vol: 79.7 ml 41.70 ml/m?  AORTIC VALVE                     PULMONIC VALVE AV Area (Vmax):    0.82 cm?      PV Vmax:       1.31 m/s AV Area (Vmean):   1.17 cm?      PV Peak grad:  6.9 mmHg AV Area (VTI):     1.22 cm? AV Vmax:           392.00 cm/s AV Vmean:          165.333 cm/s AV VTI:            0.538 m AV Peak Grad:      61.5 mmHg AV Mean Grad:      30.0 mmHg LVOT Vmax:         114.00 cm/s LVOT Vmean:        68.100 cm/s LVOT VTI:          0.231 m LVOT/AV VTI ratio: 0.43  AORTA Ao Root diam: 3.40 cm Ao Asc diam:  3.80 cm MITRAL VALVE                TRICUSPID VALVE MV Area (PHT): 3.77 cm?     TR Peak grad:   21.7 mmHg MV Area VTI:   2.30 cm?     TR Vmax:        233.00 cm/s MV Peak grad:  7.4 mmHg MV Mean grad:  3.0 mmHg     SHUNTS MV Vmax:       1.36 m/s     Systemic VTI:  0.23 m MV Vmean:      81.6 cm/s    Systemic Diam: 1.90 cm MV Decel Time: 201 msec MV E velocity: 122.00 cm/s MV A  velocity: 100.00 cm/s MV E/A ratio:  1.22 Gwyndolyn Kaufman MD Electronically signed by Gwyndolyn Kaufman MD Signature Date/Time: 03/17/2021/4:42:25 PM    Final    ? ?Labs: ?BMET ?Recent Labs  ?Lab 03/15/21 ?1650 03/16/21 ?1209

## 2021-03-18 NOTE — Progress Notes (Signed)
Pt is alert and oriented x4. Medication was giving at the beginning of shift without complication. PT reports of 4/10 body pain and requested a tylenol. Pts call bell is within reach.  ?

## 2021-03-18 NOTE — Consult Note (Signed)
[facility] Nurse wound consult note ? ?Reason for Consult: foot wounds ?Anderson with bedside nurse; orders updated  ?Wound type: DM vs PAD ?Patient with ESRD ?Pressure Injury POA: NA ?Measurement:see nursing flow sheets ?Wound bed: reported to be black  ?Drainage (amount, consistency, odor) none ?Periwound: intact  ?Dressing procedure/placement/frequency:  ?In the presence of ESRD, PAD, and DM would utilize conservative wound care and best practice to paint stable eschar with betadine swab for antibacterial properties and to keep dry and stable.  ? ?Consider podiatry referral if patient desires for long term management of foot/nail care.  ? ?Yamilet Mcfayden [facility], CNS, CWON-AP ?[phone]  ?

## 2021-03-18 NOTE — Progress Notes (Signed)
? ? ?  [facility] has been requested to perform a transesophageal echocardiogram on Paul Gonzalez for bacteremia.  After careful review of history and examination, the risks and benefits of transesophageal echocardiogram have been explained including risks of esophageal damage, perforation (1:10,000 risk), bleeding, pharyngeal hematoma as well as other potential complications associated with conscious sedation including aspiration, arrhythmia, respiratory failure and death. Alternatives to treatment were discussed, questions were answered. Patient is willing to proceed.  ? ?Hgb at 8.7 and platelets 92. K+ low at 3.3 and will order replacement with plans for a repeat BMET tomorrow morning. TEE tentatively scheduled with Dr. Harl Bowie on 03/19/2021 at 1030.  ? ?Erma Heritage, PA-C  ?03/18/2021 11:54 AM  ? ?

## 2021-03-18 NOTE — Progress Notes (Signed)
?PROGRESS NOTE ? ? ? ? ?Paul Gonzalez, is a 56 y.o. male, DOB - 03/14/1965, OMV:[id number] ? ?Admit date - 03/16/2021   Admitting Physician Courage Denton Brick, MD ? ?Outpatient Primary MD for the patient is Coral Spikes, DO ? ?LOS - 2 ? ?Chief Complaint  ?Patient presents with  ? abnormal lab work   ?  ? ? ?Brief Narrative:  ?56 y.o. male with past medical history relevant for type 2 Diabetes, HTN, HLD, ESRD on HD MWF, diabetic retinopathy with vision loss, anemia of ESRD admitted on 03/16/2021 with nausea vomiting and diarrhea as well as fatigue and weakness and found to have MSSA bacteremia in 4 out of 4 bottles ?  ?-Assessment and Plan: ? ? ?1) MSSA bacteremia--- review of records shows that patient did have a fever of 102.3 while in the ED on 03/15/2021 ?-blood cultures from 03/15/2021 were positive for MSSA in 4 out of 4 bottles ?-Repeat blood cultures from 03/16/2021 also appears to be positive for GPC ?-Patient received IV vancomycin in the ED ?-Discussed with ID pharmacist Mr. Heide Guile and ID physician Dr. Lucianne Lei- Dam--- recommends IV Ancef post HD hemodialysis session ?-Plan is to repeat blood cultures on 03/18/2021 after hemodialysis  ?-Echocardiogram (TTE) no vegetations seen.  ?- TEE planned for 03/19/21 ?-WBC is down to 10.0 which is reassuring ?- repeated BC done 3/15.   ?  ?2) nausea vomiting and diarrhea--seems to be resolved now ?-associated with fatigue, weakness ?-Diarrhea was without blood or mucus ?-No sick contacts at home, no recent antibiotic use, felt warm but no documented fever showed hours ?-CT abdomen and pelvis from 03/15/2021 without acute findings ?-Stool for C. difficile negative, GI pathogen/stool culture pending ?  ?3) chronic anemia of ESRD--- hemoglobin around 9 ?-No bleeding concerns at this time ?-EPO/ESA agent per nephrology team ?-No concerns about ongoing bleeding at this time ?  ?4) thrombocytopenia--- acute versus chronic, may be related to bacteremia/infection ?-Monitor closely,  no evidence of ongoing bleeding at this time ?-Consider stopping prophylactic subcu heparin if platelets trend down further ?  ?5) ESRD--- HD Monday Wednesday Friday--per nephrology team dialysis deferred to 03/17/2021 ?-Patient has been having diarrhea and does not appear to be volume overloaded at this time ?-Potassium is 4.1 ?  ?6)Elevated LFTs--- CT abdomen and pelvis from 03/15/2021 without acute findings, repeat LFTs in a.m.  ?-hepatitis profile negative for hep A, hep B or hep C ?-get right upper quadrant ultrasound ?  ?7)DM2-no recent A1c available ?-c/n Lantus insulin, ?Use Novolog/Humalog Sliding scale insulin with Accu-Cheks/Fingersticks as ordered  ?  ?8)HTN-IV hydralazine as needed elevated BP ?  ?Disposition/Need for in-Hospital Stay- patient unable to be discharged at this time due to --- MSSA bacteremia requiring IV antibiotics and further work-up to rule out endocarditis ?-Possible discharge home on 03/18/2021 after hemodialysis and after repeat blood cultures with plans to get IV Ancef post hemodialysis on Mondays Wednesdays Fridays as outpatient ?  ?Status is: Inpatient ?  ?Remains inpatient appropriate because:  ?  ?Dispo: The patient is from: Home ?             Anticipated d/c is to: Home ?             Anticipated d/c date is: anticipating DC 3/16 pending TEE and final ID recs.  ?             Patient currently is not medically stable to d/c. ?Barriers: Not Clinically Stable-  ? ?Code Status : -  Code Status: Full Code  ? ?Family Communication:  (patient is alert, awake and coherent) Discussed with wife at bedside ? ?DVT Prophylaxis  :   - SCDs   heparin injection 5,000 Units Start: 03/16/21 2200 ?SCDs Start: 03/16/21 1811 ?Place TED hose Start: 03/16/21 1811 ? ? ?Lab Results  ?Component Value Date  ? PLT 92 (L) 03/18/2021  ? ?Inpatient Medications ? ?Scheduled Meds: ? atorvastatin  20 mg Oral q1800  ? Chlorhexidine Gluconate Cloth  6 each Topical Q0600  ? citalopram  40 mg Oral Daily  ? darbepoetin  ([name]) injection - DIALYSIS  100 mcg Intravenous Q Wed-HD  ? fenofibrate  160 mg Oral Daily  ? heparin  5,000 Units Subcutaneous Q8H  ? insulin aspart  0-5 Units Subcutaneous QHS  ? insulin aspart  0-6 Units Subcutaneous TID WC  ? insulin glargine-yfgn  10 Units Subcutaneous QHS  ? multivitamin with minerals  1 tablet Oral Daily  ? potassium chloride  40 mEq Oral Once  ? sodium chloride flush  3 mL Intravenous Q12H  ? sodium chloride flush  3 mL Intravenous Q12H  ? ?Continuous Infusions: ? sodium chloride    ? sodium chloride    ? sodium chloride    ?  ceFAZolin (ANCEF) IV 2 g (03/18/21 1615)  ? ?PRN Meds:.sodium chloride, sodium chloride, sodium chloride, acetaminophen **OR** acetaminophen, alum & mag hydroxide-simeth, bisacodyl, hydrALAZINE, lidocaine (PF), lidocaine-prilocaine, ondansetron **OR** ondansetron (ZOFRAN) IV, pentafluoroprop-tetrafluoroeth, polyethylene glycol, sodium chloride flush ? ? ?Anti-infectives (From admission, onward)  ? ? Start     Dose/Rate Route Frequency Ordered Stop  ? 03/18/21 1600  ceFAZolin (ANCEF) IVPB 2g/100 mL premix       ? 2 g ?200 mL/hr over 30 Minutes Intravenous Every M-W-F (Hemodialysis) 03/16/21 1509    ? 03/17/21 1200  ceFAZolin (ANCEF) IVPB 2g/100 mL premix       ? 2 g ?200 mL/hr over 30 Minutes Intravenous Every T-Th-Sa (Hemodialysis) 03/16/21 1509 03/17/21 1306  ? 03/16/21 1200  vancomycin (VANCOREADY) IVPB 1500 mg/300 mL       ? 1,500 mg ?150 mL/hr over 120 Minutes Intravenous  Once 03/16/21 1145 03/16/21 1428  ? ?  ? ?Subjective: ?Paul Gonzalez  ?No specific complaints today.  ?Objective: ?Vitals:  ? 03/18/21 1530 03/18/21 1600 03/18/21 1630 03/18/21 1645  ?BP: 109/70 114/70 103/69 113/77  ?Pulse: 70 79 80 80  ?Resp:      ?Temp:      ?TempSrc:      ?SpO2:      ?Weight:      ?Height:      ? ? ?Intake/Output Summary (Last 24 hours) at 03/18/2021 1727 ?Last data filed at 03/18/2021 1645 ?Gross per 24 hour  ?Intake 120 ml  ?Output 62 ml  ?Net 58 ml  ? ?Filed Weights  ?  03/16/21 1418 03/17/21 0900 03/18/21 1300  ?Weight: 77.8 kg 77.5 kg 76.9 kg  ? ?Physical Exam ? ?Gen:- Awake Alert,NAD. Cooperative and pleasant.  ?HEENT:--Legally blind. Nares clear.  Throat clear.  ?Neck-no thyromegaly or masses palpated.   ?Lungs-  BBS no rhonchi heard.  ?CV- S1 and S2 normal, regular, ??  Heart murmur but this appears to be transmitted sounds from patient's left arm fistula  ?Abd-  +ve B.Sounds, Abd Soft, No tenderness,    ?Extremity/Skin:- No  edema, pedal pulses present  ?Psych-affect is appropriate, oriented x3 ?Neuro-no new focal deficits, no tremors ?-MSK-left upper extremity AV fistula positive thrill and bruit ? ?Data Reviewed: I have  personally reviewed following labs and imaging studies ? ?CBC: ?Recent Labs  ?Lab 03/15/21 ?1650 03/16/21 ?1209 03/17/21 ?0355 03/18/21 ?9741  ?WBC 12.7* 9.3 10.0 8.5  ?NEUTROABS  --  8.7*  --   --   ?HGB 11.1* 10.2* 9.1* 8.7*  ?HCT 33.3* 30.5* 26.9* 25.8*  ?MCV 96.5 96.2 95.4 95.2  ?PLT 106* 81* 91* 92*  ? ?Basic Metabolic Panel: ?Recent Labs  ?Lab 03/15/21 ?1650 03/16/21 ?1209 03/17/21 ?6384 03/18/21 ?5364  ?NA 129* 131* 132* 135  ?K 3.8 4.1 3.6 3.3*  ?CL 90* 93* 96* 96*  ?CO2 22 20* 19* 27  ?GLUCOSE 276* 296* 171* 82  ?BUN 66* 98* 118* 66*  ?CREATININE 8.68* 9.93* 10.93* 6.84*  ?CALCIUM 9.1 8.9 8.7* 8.4*  ? ?GFR: ?Estimated Creatinine Clearance: 11.8 mL/min (A) (by C-G formula based on SCr of 6.84 mg/dL (H)). ?Liver Function Tests: ?Recent Labs  ?Lab 03/15/21 ?1650 03/16/21 ?1209 03/17/21 ?6803  ?AST 69* 108* 70*  ?ALT 50* 79* 81*  ?ALKPHOS 43 39 36*  ?BILITOT 0.8 1.0 1.0  ?PROT 7.6 6.8 6.1*  ?ALBUMIN 3.8 3.3* 2.9*  ? ?Cardiac Enzymes: ?No results for input(s): CKTOTAL, CKMB, CKMBINDEX, TROPONINI in the last 168 hours. ?BNP (last 3 results) ?No results for input(s): PROBNP in the last 8760 hours. ?HbA1C: ?Recent Labs  ?  03/16/21 ?1209  ?HGBA1C 6.6*  ? ? ?Recent Results (from the past 240 hour(s))  ?Resp Panel by RT-PCR (Flu A&B, Covid) Nasopharyngeal  Swab     Status: None  ? Collection Time: 03/15/21  4:58 PM  ? Specimen: Nasopharyngeal Swab; Nasopharyngeal(NP) swabs in vial transport medium  ?Result Value Ref Range Status  ? SARS Coronavirus 2 by

## 2021-03-19 ENCOUNTER — Inpatient Hospital Stay (HOSPITAL_COMMUNITY): Payer: Medicare Other | Admitting: Anesthesiology

## 2021-03-19 ENCOUNTER — Encounter (HOSPITAL_COMMUNITY): Payer: Self-pay | Admitting: Family Medicine

## 2021-03-19 ENCOUNTER — Encounter (HOSPITAL_COMMUNITY)
Admission: EM | Disposition: A | Discharge status: Home / Self Care | Payer: Self-pay | Source: Home / Self Care | Attending: Family Medicine

## 2021-03-19 ENCOUNTER — Other Ambulatory Visit (HOSPITAL_COMMUNITY): Payer: Self-pay | Admitting: *Deleted

## 2021-03-19 ENCOUNTER — Telehealth: Payer: Self-pay | Admitting: Infectious Disease

## 2021-03-19 ENCOUNTER — Inpatient Hospital Stay (HOSPITAL_COMMUNITY): Payer: Medicare Other

## 2021-03-19 DIAGNOSIS — E11319 Type 2 diabetes mellitus with unspecified diabetic retinopathy without macular edema: Secondary | ICD-10-CM | POA: Diagnosis not present

## 2021-03-19 DIAGNOSIS — I340 Nonrheumatic mitral (valve) insufficiency: Secondary | ICD-10-CM

## 2021-03-19 DIAGNOSIS — I120 Hypertensive chronic kidney disease with stage 5 chronic kidney disease or end stage renal disease: Secondary | ICD-10-CM

## 2021-03-19 DIAGNOSIS — N186 End stage renal disease: Secondary | ICD-10-CM | POA: Diagnosis not present

## 2021-03-19 DIAGNOSIS — R7881 Bacteremia: Secondary | ICD-10-CM

## 2021-03-19 DIAGNOSIS — E1122 Type 2 diabetes mellitus with diabetic chronic kidney disease: Secondary | ICD-10-CM

## 2021-03-19 DIAGNOSIS — I10 Essential (primary) hypertension: Secondary | ICD-10-CM | POA: Diagnosis not present

## 2021-03-19 HISTORY — PX: TEE WITHOUT CARDIOVERSION: SHX5443

## 2021-03-19 LAB — CULTURE, BLOOD (ROUTINE X 2)

## 2021-03-19 LAB — BASIC METABOLIC PANEL
Anion gap: 13 (ref 5–15)
BUN: 50 mg/dL — ABNORMAL HIGH (ref 6–20)
CO2: 25 mmol/L (ref 22–32)
Calcium: 8.2 mg/dL — ABNORMAL LOW (ref 8.9–10.3)
Chloride: 95 mmol/L — ABNORMAL LOW (ref 98–111)
Creatinine, Ser: 5.33 mg/dL — ABNORMAL HIGH (ref 0.61–1.24)
GFR, Estimated: 12 mL/min — ABNORMAL LOW (ref >60)
Glucose, Bld: 176 mg/dL — ABNORMAL HIGH (ref 70–99)
Potassium: 3.6 mmol/L (ref 3.5–5.1)
Sodium: 133 mmol/L — ABNORMAL LOW (ref 135–145)

## 2021-03-19 LAB — MAGNESIUM: Magnesium: 2.3 mg/dL (ref 1.7–2.4)

## 2021-03-19 LAB — GLUCOSE, CAPILLARY
Glucose-Capillary: 172 mg/dL — ABNORMAL HIGH (ref 70–99)
Glucose-Capillary: 154 mg/dL — ABNORMAL HIGH (ref 70–99)
Glucose-Capillary: 147 mg/dL — ABNORMAL HIGH (ref 70–99)

## 2021-03-19 SURGERY — ECHOCARDIOGRAM, TRANSESOPHAGEAL
Anesthesia: General

## 2021-03-19 MED ORDER — INSULIN NPH (HUMAN) (ISOPHANE) 100 UNIT/ML ~~LOC~~ SUSP: 10.0000 [IU] | Freq: Every day | SUBCUTANEOUS | Status: DC

## 2021-03-19 MED ORDER — SODIUM CHLORIDE 0.9 % IV SOLN
Freq: Once | INTRAVENOUS | Status: DC
Start: 2021-03-19 — End: 2021-03-19

## 2021-03-19 MED ORDER — GLYCOPYRROLATE PF 0.2 MG/ML IJ SOSY
PREFILLED_SYRINGE | INTRAMUSCULAR | Status: AC
  Filled 2021-03-19: qty 3

## 2021-03-19 MED ORDER — LACTATED RINGERS IV SOLN: INTRAVENOUS | Status: DC

## 2021-03-19 MED ORDER — SALONPAS DEEP RELIEVING 3.1-10-15 % EX GEL: 1.0000 | Freq: Two times a day (BID) | CUTANEOUS | 1 refills | Status: DC | PRN

## 2021-03-19 MED ORDER — PROPOFOL 10 MG/ML IV BOLUS
INTRAVENOUS | Status: AC
  Filled 2021-03-19: qty 20

## 2021-03-19 MED ORDER — PHENYLEPHRINE 40 MCG/ML (10ML) SYRINGE FOR IV PUSH (FOR BLOOD PRESSURE SUPPORT)
PREFILLED_SYRINGE | INTRAVENOUS | Status: AC
  Filled 2021-03-19: qty 10

## 2021-03-19 MED ORDER — PROPOFOL 10 MG/ML IV BOLUS
INTRAVENOUS | Status: DC | PRN
  Administered 2021-03-19: 30 mg via INTRAVENOUS
  Administered 2021-03-19: 20 mg via INTRAVENOUS
  Administered 2021-03-19: 100 mg via INTRAVENOUS
  Administered 2021-03-19 (×3): 20 mg via INTRAVENOUS

## 2021-03-19 MED ORDER — POTASSIUM CHLORIDE CRYS ER 20 MEQ PO TBCR: 40.0000 meq | EXTENDED_RELEASE_TABLET | Freq: Once | ORAL | Status: DC

## 2021-03-19 MED ORDER — POTASSIUM CHLORIDE CRYS ER 20 MEQ PO TBCR
20.0000 meq | EXTENDED_RELEASE_TABLET | Freq: Once | ORAL | Status: AC
  Administered 2021-03-19: 20 meq via ORAL
  Filled 2021-03-19: qty 1

## 2021-03-19 MED ORDER — DEXMEDETOMIDINE (PRECEDEX) IN NS 20 MCG/5ML (4 MCG/ML) IV SYRINGE
PREFILLED_SYRINGE | INTRAVENOUS | Status: AC
  Filled 2021-03-19: qty 10

## 2021-03-19 MED ORDER — LIDOCAINE HCL (CARDIAC) PF 100 MG/5ML IV SOSY
PREFILLED_SYRINGE | INTRAVENOUS | Status: DC | PRN
  Administered 2021-03-19: 80 mg via INTRAVENOUS

## 2021-03-19 MED ORDER — SODIUM CHLORIDE 0.9 % IV SOLN: INTRAVENOUS | Status: DC | PRN

## 2021-03-19 MED ORDER — CEFAZOLIN IV (FOR PTA / DISCHARGE USE ONLY): 2.0000 g | INTRAVENOUS | 0 refills | Status: AC

## 2021-03-19 MED ORDER — CHLORHEXIDINE GLUCONATE 0.12 % MT SOLN
15.0000 mL | Freq: Once | OROMUCOSAL | Status: AC
  Administered 2021-03-19: 15 mL via OROMUCOSAL

## 2021-03-19 MED ORDER — ETOMIDATE 2 MG/ML IV SOLN
INTRAVENOUS | Status: AC
  Filled 2021-03-19: qty 10

## 2021-03-19 MED ORDER — LIDOCAINE HCL (PF) 2 % IJ SOLN
INTRAMUSCULAR | Status: AC
  Filled 2021-03-19: qty 5

## 2021-03-19 MED ORDER — ORAL CARE MOUTH RINSE: 15.0000 mL | Freq: Once | OROMUCOSAL | Status: AC

## 2021-03-19 NOTE — Anesthesia Postprocedure Evaluation (Signed)
Anesthesia Post Note ? ?Patient: Paul Gonzalez ? ?Procedure(s) Performed: TRANSESOPHAGEAL ECHOCARDIOGRAM (TEE) ? ?Patient location during evaluation: Phase II ?Anesthesia Type: General ?Level of consciousness: awake ?Pain management: pain level controlled ?Vital Signs Assessment: post-procedure vital signs reviewed and stable ?Respiratory status: spontaneous breathing and respiratory function stable ?Cardiovascular status: blood pressure returned to baseline and stable ?Postop Assessment: no headache and no apparent nausea or vomiting ?Anesthetic complications: no ?Comments: Late entry ? ? ?No notable events documented. ? ? ?Last Vitals:  ?Vitals:  ? 03/19/21 1144 03/19/21 1200  ?BP:  (!) 168/71  ?Pulse:  80  ?Resp:  18  ?Temp: 37.3 ?C 36.8 ?C  ?SpO2:  98%  ?  ?Last Pain:  ?Vitals:  ? 03/19/21 1344  ?TempSrc:   ?PainSc: 0-No pain  ? ? ?  ?  ?  ?  ?  ?  ? ?Louann Sjogren ? ? ? ? ?

## 2021-03-19 NOTE — Anesthesia Preprocedure Evaluation (Signed)
Anesthesia Evaluation  ?Patient identified by MRN, date of birth, ID band ?Patient awake ? ? ? ?Reviewed: ?Allergy & Precautions, H&P , NPO status , Patient's Chart, lab work & pertinent test results, reviewed documented beta blocker date and time  ? ?Airway ?Mallampati: II ? ?TM Distance: >3 FB ?Neck ROM: full ? ? ? Dental ?no notable dental hx. ? ?  ?Pulmonary ?neg pulmonary ROS,  ?  ?Pulmonary exam normal ?breath sounds clear to auscultation ? ? ? ? ? ? Cardiovascular ?Exercise Tolerance: Good ?hypertension, negative cardio ROS ? ? ?Rhythm:regular Rate:Normal ? ? ?  ?Neuro/Psych ?PSYCHIATRIC DISORDERS Depression negative neurological ROS ?   ? GI/Hepatic ?negative GI ROS, Neg liver ROS,   ?Endo/Other  ?negative endocrine ROSdiabetes, Poorly Controlled, Type 2 ? Renal/GU ?ESRF and DialysisRenal disease  ?negative genitourinary ?  ?Musculoskeletal ? ? Abdominal ?  ?Peds ? Hematology ? ?(+) Blood dyscrasia, anemia ,   ?Anesthesia Other Findings ? ? Reproductive/Obstetrics ?negative OB ROS ? ?  ? ? ? ? ? ? ? ? ? ? ? ? ? ?  ?  ? ? ? ? ? ? ? ? ?Anesthesia Physical ?Anesthesia Plan ? ?ASA: 4 and emergent ? ?Anesthesia Plan: General  ? ?Post-op Pain Management:   ? ?Induction:  ? ?PONV Risk Score and Plan: Propofol infusion ? ?Airway Management Planned:  ? ?Additional Equipment:  ? ?Intra-op Plan:  ? ?Post-operative Plan:  ? ?Informed Consent: I have reviewed the patients History and Physical, chart, labs and discussed the procedure including the risks, benefits and alternatives for the proposed anesthesia with the patient or authorized representative who has indicated his/her understanding and acceptance.  ? ? ? ?Dental Advisory Given ? ?Plan Discussed with: CRNA ? ?Anesthesia Plan Comments:   ? ? ? ? ? ? ?Anesthesia Quick Evaluation ? ?

## 2021-03-19 NOTE — Care Management Important Message (Signed)
Important Message ? ?Patient Details  ?Name: Paul Gonzalez ?MRN: [id number] ?Date of Birth: October 17, 1965 ? ? ?Medicare Important Message Given:  Yes ? ? ? ? ?Tommy Medal ?03/19/2021, 1:45 PM ?

## 2021-03-19 NOTE — Discharge Summary (Signed)
Physician Discharge Summary  ?Paul Gonzalez INO:[id number] DOB: 1965/02/03 DOA: 03/16/2021 ? ?PCP: Coral Spikes, DO ? ?Admit date: 03/16/2021 ?Discharge date: 03/19/2021 ? ?Admitted From:  Home  ?Disposition: Home  ? ?Recommendations for Outpatient Follow-up:  ?Follow up with PCP in 1 weeks ?Resume regular HD schedule tomorrow 3/17 ?Take cefazolin IV as ordered during every dialysis treatment thru 04/29/21 ?Please follow up on final results of blood cultures from 03/18/21  ? ?Discharge Condition: STABLE   ?CODE STATUS: FULL ?DIET: renal carb modified   ? ?Brief Hospitalization Summary: ?Please see all hospital notes, images, labs for full details of the hospitalization. ?ADMISSION HPI:  56 y.o. male with past medical history relevant for type 2 Diabetes, HTN, HLD, ESRD on HD MWF, diabetic retinopathy with vision loss, anemia of ESRD who presents to the ED with nausea vomiting and diarrhea since 03/14/2021, associated with fatigue, weakness ?-Diarrhea was without blood or mucus ?-No sick contacts at home, no recent antibiotic use, felt warm -review of records shows that patient did have a fever of 102.3 while in the ED on 03/15/2021 ?-CT abdomen and pelvis from 03/15/2021 without acute findings ?-Patient was seen in the ED on 03/15/2021 for above symptoms blood cultures were obtained patient got a call today to return to the ED as patient blood cultures were positive for MSSA in 4 out of 4 bottles ?-Patient received IV vancomycin in the ED ?-Discussed with ID pharmacist Mr. Heide Guile and ID physician Dr. Lucianne Lei- Dam--- recommends IV Ancef post HD hemodialysis session ?-Plan is to repeat blood cultures in 24 to 48 hours from now ?-Echocardiogram pending May need TEE ?--Additional history obtained from patient's wife at bedside ? ?HOSPITAL COURSE BY PROBLEM LIST  ? ?1) MSSA bacteremia--- review of records shows that patient did have a fever of 102.3 while in the ED on 03/15/2021 ?-blood cultures from 03/15/2021 were positive for  MSSA in 4 out of 4 bottles ?-Repeat blood cultures from 03/16/2021 also appears to be positive for GPC, repeated [facility] 03/18/21 no growth to date  ?-Patient received IV vancomycin in the ED ?-Discussed with ID pharmacist Mr. Heide Guile and ID physician Dr. Lucianne Lei- Dam--- recommends IV Ancef post HD hemodialysis session ?-Plan is to repeat blood cultures on 03/18/2021 after hemodialysis  ?-Echocardiogram (TTE) no vegetations seen.  ?- TEE planned for 03/19/21:  spoke with Dr. Harl Bowie, no evidence or findings of vegetations seen.  ?- ID Dr. Tommy Medal recommending 6 weeks of cefazolin with hemodialysis thru 04/29/21 ?-WBC is down to 10.0 which is reassuring ?- repeated BC done 3/15- no growth to date.   ?  ?2) nausea vomiting and diarrhea--seems to be resolved now ?-associated with fatigue, weakness ?-Diarrhea was without blood or mucus ?-No sick contacts at home, no recent antibiotic use, felt warm but no documented fever showed hours ?-CT abdomen and pelvis from 03/15/2021 without acute findings ?-Stool for C. difficile negative ?  ?3) chronic anemia of ESRD--- hemoglobin around 9 ?-No bleeding concerns at this time ?-EPO/ESA agent per nephrology team ?-No concerns about ongoing bleeding at this time ?  ?4) thrombocytopenia--- acute versus chronic, may be related to bacteremia/infection ?-Monitor closely, no evidence of ongoing bleeding at this time ?  ?5) ESRD--- HD Monday Wednesday Friday--per nephrology team dialysis deferred to 03/17/2021 ?-Patient has been having diarrhea and does not appear to be volume overloaded at this time ?-Potassium is 4.1 ?  ?6)Elevated LFTs--- CT abdomen and pelvis from 03/15/2021 without acute findings, repeat LFTs in a.m.  ?-  hepatitis profile negative for hep A, hep B or hep C ?-get right upper quadrant ultrasound ?  ?7)DM2-no recent A1c available ?-c/n Lantus insulin ?Use Novolog/Humalog Sliding scale insulin with Accu-Cheks/Fingersticks as ordered  ?  ?8)HTN-IV hydralazine as needed elevated  BP ? ?Discharge Diagnoses:  ?Principal Problem: ?  MSSA bacteremia ?Active Problems: ?  HTN (hypertension), benign ?  Type 2 diabetes mellitus with complications ([place]) ?  Anemia in CKD (chronic kidney disease) ?  ESRD (end stage renal disease) on dialysis [facility]) ?  Diabetic retinopathy/legally blind ? ?Discharge Instructions: ?Discharge Instructions   ? ? [facility] pharmacist to adjust dose for Vancomycin, Aminoglycosides and other anti-infective therapies as requested by physician.   Complete by: As directed ?  ? [facility] to provide Cath Flo 38m   Complete by: As directed ?  ? Administer for PICC line occlusion and as ordered by physician for other access device issues.  ? Anaphylaxis Kit: Provided to treat any anaphylactic reaction to the medication being provided to the patient if First Dose or when requested by physician   Complete by: As directed ?  ? Epinephrine 175mml vial / amp: Administer 0.6m14m0.6ml65mubcutaneously once for moderate to severe anaphylaxis, nurse to call physician and pharmacy when reaction occurs and call 911 if needed for immediate care  ? Diphenhydramine 50mg25mIV vial: Administer 25-50mg 32mM PRN for first dose reaction, rash, itching, mild reaction, nurse to call physician and pharmacy when reaction occurs  ? Sodium Chloride 0.9% NS 500ml I26mdminister if needed for hypovolemic blood pressure drop or as ordered by physician after call to physician with anaphylactic reaction  ? Change dressing on IV access line weekly and PRN   Complete by: As directed ?  ? Flush IV access with Sodium Chloride 0.9% and Heparin 10 units/ml or 100 units/ml   Complete by: As directed ?  ? Home infusion instructions - [facility]   Complete by: As directed ?  ? Instructions: Flush IV access with Sodium Chloride 0.9% and Heparin 10units/ml or 100units/ml  ? Change dressing on IV access line: Weekly and PRN  ? Instructions Cath Flo 2mg: Ad20mister for PICC Line  occlusion and as ordered by physician for other access device  ? [facility] pharmacist to adjust dose for: Vancomycin, Aminoglycosides and other anti-infective therapies as requested by physician  ? Method of administration may be changed at the discretion of home infusion pharmacist based upon assessment of the patient and/or caregiver?s ability to self-administer the medication ordered   Complete by: As directed ?  ? ?  ? ?Allergies as of 03/19/2021   ?No Known Allergies ?  ? ?  ?Medication List  ?  ? ?TAKE these medications   ? ?atorvastatin 20 MG tablet ?Commonly known as: LIPITOR ?Take 1 tablet by mouth once daily ?  ?BD Veo Insulin Syringe U/F 31G X 15/64" 0.3 ML Misc ?Generic drug: Insulin Syringe-Needle U-100 ?USE AS DIRECTED ?  ?calcium acetate 667 MG capsule ?Commonly known as: PHOSLO ?Take 667 mg by mouth 3 (three) times daily with meals. ?  ?ceFAZolin  IVPB ?Commonly known as: ANCEF ?Inject 2 g into the vein every Monday, Wednesday, and Friday with hemodialysis. Indication:  MSSA Bacteremai ?First Dose: No ?Last Day of Therapy:  04/29/2021 ?Labs - Once weekly:  CBC/D and BMP, ?Labs - Every other week:  ESR and CRP ?Method of administration: IV Push ?Method of administration may be changed at the discretion of home  infusion pharmacist based upon assessment of the patient and/or caregiver's ability to self-administer the medication ordered. ?Start taking on: March 20, 2021 ?  ?citalopram 40 MG tablet ?Commonly known as: CELEXA ?Take 1 tablet (40 mg total) by mouth daily. ?  ?fenofibrate 145 MG tablet ?Commonly known as: Tricor ?Take 1 tablet (145 mg total) by mouth daily. ?  ?furosemide 80 MG tablet ?Commonly known as: LASIX ?Take 80 mg by mouth daily. ?  ?insulin NPH Human 100 UNIT/ML injection ?Commonly known as: NovoLIN N ?Inject 0.1-0.25 mLs (10-25 Units total) into the skin at bedtime. ?  ?loratadine 10 MG tablet ?Commonly known as: Claritin ?Take 1 tablet (10 mg total) by mouth daily. ?   ?multivitamin tablet ?Take 1 tablet by mouth daily. ?  ?ondansetron 4 MG disintegrating tablet ?Commonly known as: ZOFRAN-ODT ?Take 1 tablet (4 mg total) by mouth every 8 (eight) hours as needed for nausea or vomit

## 2021-03-19 NOTE — Telephone Encounter (Signed)
Can we make MIke an appt with me in about 8 weeks so I can check some surveillance blood culutres on him He was at [facility] ?

## 2021-03-19 NOTE — TOC Transition Note (Signed)
Transition of Care (TOC) - CM/SW Discharge Note ? ? ?Patient Details  ?Name: Paul Gonzalez ?MRN: [id number] ?Date of Birth: 08-Apr-1965 ? ?Transition of Care (TOC) CM/SW Contact:  ?Boneta Lucks, RN ?Phone Number: ?03/19/2021, 1:14 PM ? ? ?Clinical Narrative:   Patient discharging home. Patient needs 6 weeks antibiotic therapy with dialysis. Faxed DC summary and OPAT to [facility] in [place].  ? ? ?Final next level of care: Home/Self Care ?Barriers to Discharge: Barriers Resolved ? ? ?Patient Goals and CMS Choice ?Patient states their goals for this hospitalization and ongoing recovery are:: to go home. ?CMS Medicare.gov Compare Post Acute Care list provided to:: Patient ?Choice offered to / list presented to : Patient ? ?Discharge Placement ?  ?          ?Patient and family notified of of transfer: 03/19/21 ? ?Discharge Plan and Services ?   ? ?Readmission Risk Interventions ?Readmission Risk Prevention Plan 03/19/2021  ?Transportation Screening Complete  ?PCP or Specialist Appt within 5-7 Days Complete  ?Home Care Screening Complete  ?Medication Review (RN CM) Complete  ?Some recent data might be hidden  ? ? ? ? ? ?

## 2021-03-19 NOTE — Progress Notes (Addendum)
*  PRELIMINARY RESULTS* ?Echocardiogram ?Transesophageal Echocardiogram has been performed. ? ?Paul Gonzalez ?03/19/2021, 11:47 AM ?

## 2021-03-19 NOTE — Anesthesia Procedure Notes (Signed)
Date/Time: 03/19/2021 9:52 AM ?Performed by: Vista Deck, CRNA ?Pre-anesthesia Checklist: Patient identified, Emergency Drugs available, Suction available, Timeout performed and Patient being monitored ?Patient Re-evaluated:Patient Re-evaluated prior to induction ?Oxygen Delivery Method: Nasal Cannula ? ? ? ? ?

## 2021-03-19 NOTE — Progress Notes (Addendum)
?[facility] ?Progress Note  ? ? ?Assessment/ Plan:   ?Assessment/Plan: 56 year old WM with longstanding DM-  blindness and ESRD presents with MSSA bacteremia  ?1 MSSA bacteremia-  in all 4 bottles-  on ancef, ID following.  Source unclear,  HD unit had denied any access issues and his fistula looks good-  still could have had some incidental bacteria introduction during HD.  TEE pending. Repeat bcx drawn 3/15, may need to have VVS take a look at AVF to see if it was a potential source ?2 ESRD: normally MWF at [facility]- via AVF- next HD on schedule tomorrow 3/17 ?3 Hypertension/volume: UF as tolerated. Under EDW--may need new EDW on d/c. Overall euvolemic on exam ?4. Anemia of ESRD: was only on iron at OP center-  ESA started 3/15. Hold on iron in the setting of bacteremia . Transfuse prn for hgb <7 ?5. Metabolic Bone Disease: continue calcitriol -  hold renvela for now given GI distress ?6.  N/V/D-  has been ruled out for Cdiff-  has elevated LFTs but CT is negative-  per primary team ? ?Outpatient orders Dialyzes at [facility] MWF 3 hours and 45 minutes   EDW 78.5. ?HD Bath 2/2.5, Dialyzer 300, Heparin no. Access left AVF- 15 gauge-  400 BFR/500 DFR ?Meds: 0.75 calcitriol q treatment and venofer 50 weekly ? ?Subjective:   ?No acute events. Tolerated hd yesterday-ran net even. TEE planned for today.  ? ?Objective:   ?BP (!) 159/69 (BP Location: Right Arm)   Pulse 87   Temp 98.6 ?F (37 ?C) (Oral)   Resp 17   Ht '5\' 8"'$  (1.727 m)   Wt 76.9 kg   SpO2 98%   BMI 25.78 kg/m?  ? ?Intake/Output Summary (Last 24 hours) at 03/19/2021 0842 ?Last data filed at 03/18/2021 1645 ?Gross per 24 hour  ?Intake --  ?Output 62 ml  ?Net -62 ml  ? ?Weight change: -0.6 kg ? ?Physical Exam: ?Gen:nad, legally blind ?UVO:Z3G6, +systolic murmur ?Resp:cta bl ?YQI:HKVQ, nt ?Ext:no edema ?Neuro: awake, alert ?Dialysis access: LUE AVF +b/t ? ?Imaging: ?ECHOCARDIOGRAM COMPLETE ? ?Result Date: 03/17/2021 ?    ECHOCARDIOGRAM REPORT   Patient Name:   Paul Gonzalez Date of Exam: 03/17/2021 Medical Rec #:  [id number]       Height:       68.0 in Accession #:    [id number]      Weight:       170.9 lb Date of Birth:  03-Jan-1966       BSA:          1.911 m? Patient Age:    75 years        BP:           154/87 mmHg Patient Gender: M               HR:           99 bpm. Exam Location:  [facility] Procedure: 2D Echo, Cardiac Doppler and Color Doppler Indications:    Bacteremia  History:        Patient has prior history of Echocardiogram examinations, most                 recent 09/04/2019. Risk Factors:Hypertension, Diabetes and                 Dyslipidemia. Patient is Blind. Test discontinued at patient  requet due to pain.  Sonographer:    Wenda Low Referring Phys: [phone] CORNELIUS N VAN DAM  Sonographer Comments: Suboptimal apical window. IMPRESSIONS  1. Study stopped early due to patient discomfort.  2. Left ventricular ejection fraction, by estimation, is 60 to 65%. The left ventricle has normal function. The left ventricle has no regional wall motion abnormalities. There is moderate concentric left ventricular hypertrophy. Left ventricular diastolic parameters were normal.  3. Right ventricular systolic function is normal. The right ventricular size is normal. There is normal pulmonary artery systolic pressure.  4. Left atrial size was moderately dilated.  5. The mitral valve is abnormal. Trivial mitral valve regurgitation. Moderate mitral annular calcification.  6. The aortic valve has an indeterminant number of cusps but appears tricuspid. There is moderate calcification of the aortic valve. There is moderate thickening of the aortic valve. Aortic valve regurgitation is not visualized. Moderate aortic valve stenosis. AVA 1.0cm2 by continuity. Aortic valve mean gradient 28mHg. Aortic valve Vmax measures 3.92 m/s. DI 0.28. Comparison(s): Compared to prior TTE in 08/2019, the aortic stenosis is now moderate  (previously mild to moderate with mean gradient 125mg (now 3058m)). Conclusion(s)/Recommendation(s): No evidence of valvular vegetations on this transthoracic echocardiogram. Consider a transesophageal echocardiogram to exclude infective endocarditis if clinically indicated. FINDINGS  Left Ventricle: Left ventricular ejection fraction, by estimation, is 60 to 65%. The left ventricle has normal function. The left ventricle has no regional wall motion abnormalities. The left ventricular internal cavity size was normal in size. There is  moderate concentric left ventricular hypertrophy. Left ventricular diastolic parameters were normal. Right Ventricle: The right ventricular size is normal. Right vetricular wall thickness was not well visualized. Right ventricular systolic function is normal. There is normal pulmonary artery systolic pressure. The tricuspid regurgitant velocity is 2.33 m/s, and with an assumed right atrial pressure of 8 mmHg, the estimated right ventricular systolic pressure is 29.01.0Hg. Left Atrium: Left atrial size was moderately dilated. Right Atrium: Right atrial size was normal in size. Pericardium: There is no evidence of pericardial effusion. Mitral Valve: The mitral valve is abnormal. There is moderate thickening of the mitral valve leaflet(s). There is moderate calcification of the mitral valve leaflet(s). Moderate mitral annular calcification. Trivial mitral valve regurgitation. MV peak gradient, 7.4 mmHg. The mean mitral valve gradient is 3.0 mmHg. Tricuspid Valve: The tricuspid valve is normal in structure. Tricuspid valve regurgitation is trivial. Aortic Valve: The aortic valve has an indeterminant number of cusps. There is moderate calcification of the aortic valve. There is moderate thickening of the aortic valve. Aortic valve regurgitation is not visualized. Moderate aortic stenosis is present.  Aortic valve mean gradient measures 30.0 mmHg. Aortic valve peak gradient measures 61.5  mmHg. Aortic valve area, by VTI measures 1.22 cm?. Pulmonic Valve: The pulmonic valve was normal in structure. Pulmonic valve regurgitation is trivial. Aorta: The aortic root is normal in size and structure. IAS/Shunts: The atrial septum is grossly normal.  LEFT VENTRICLE PLAX 2D LVIDd:         4.90 cm   Diastology LVIDs:         2.90 cm   LV e' medial:    7.07 cm/s LV PW:         1.40 cm   LV E/e' medial:  17.3 LV IVS:        1.40 cm   LV e' lateral:   9.36 cm/s LVOT diam:     1.90 cm   LV E/e' lateral: 13.0 LV  SV:         66 LV SV Index:   34 LVOT Area:     2.84 cm?  RIGHT VENTRICLE RV Basal diam:  3.85 cm RV Mid diam:    4.40 cm LEFT ATRIUM             Index        RIGHT ATRIUM           Index LA diam:        4.80 cm 2.51 cm/m?   RA Area:     19.40 cm? LA Vol (A2C):   69.1 ml 36.15 ml/m?  RA Volume:   52.60 ml  27.52 ml/m? LA Vol (A4C):   91.6 ml 47.92 ml/m? LA Biplane Vol: 79.7 ml 41.70 ml/m?  AORTIC VALVE                     PULMONIC VALVE AV Area (Vmax):    0.82 cm?      PV Vmax:       1.31 m/s AV Area (Vmean):   1.17 cm?      PV Peak grad:  6.9 mmHg AV Area (VTI):     1.22 cm? AV Vmax:           392.00 cm/s AV Vmean:          165.333 cm/s AV VTI:            0.538 m AV Peak Grad:      61.5 mmHg AV Mean Grad:      30.0 mmHg LVOT Vmax:         114.00 cm/s LVOT Vmean:        68.100 cm/s LVOT VTI:          0.231 m LVOT/AV VTI ratio: 0.43  AORTA Ao Root diam: 3.40 cm Ao Asc diam:  3.80 cm MITRAL VALVE                TRICUSPID VALVE MV Area (PHT): 3.77 cm?     TR Peak grad:   21.7 mmHg MV Area VTI:   2.30 cm?     TR Vmax:        233.00 cm/s MV Peak grad:  7.4 mmHg MV Mean grad:  3.0 mmHg     SHUNTS MV Vmax:       1.36 m/s     Systemic VTI:  0.23 m MV Vmean:      81.6 cm/s    Systemic Diam: 1.90 cm MV Decel Time: 201 msec MV E velocity: 122.00 cm/s MV A velocity: 100.00 cm/s MV E/A ratio:  1.22 Gwyndolyn Kaufman MD Electronically signed by Gwyndolyn Kaufman MD Signature Date/Time: 03/17/2021/4:42:25 PM    Final     ? ?Labs: ?BMET ?Recent Labs  ?Lab 03/15/21 ?1650 03/16/21 ?1209 03/17/21 ?9381 03/18/21 ?0175 03/19/21 ?0542  ?NA 129* 131* 132* 135 133*  ?K 3.8 4.1 3.6 3.3* 3.6  ?CL 90* 93* 96* 96* 95*  ?CO2 22 20* 19* 27 25  ?GLUCOSE 27

## 2021-03-19 NOTE — Anesthesia Procedure Notes (Signed)
Date/Time: 03/19/2021 10:28 AM ?Performed by: Vista Deck, CRNA ?Pre-anesthesia Checklist: Patient identified, Emergency Drugs available, Suction available, Timeout performed and Patient being monitored ?Patient Re-evaluated:Patient Re-evaluated prior to induction ?Oxygen Delivery Method: Non-rebreather mask ? ? ? ? ?

## 2021-03-19 NOTE — CV Procedure (Signed)
Patient was brought to the procedure suite after appropriate consent was obtained. He was placed in the left lateral decubitus position with bite block placed. Sedation achieved with the assistance of anesthesiology, please see there records for details. TEE probe intubated into esophagus without difficulty and several images obtained. Please see full TEE report for complete details. Aortic valve density calcification cannto exlcude possible vegetation, to review complete study and finalize interpretation in TEE report. Cardiopulmonary monitoring was performed throughout the procedure, he tolerated well without complications.  ? ? ?Paul Gonzalez ?

## 2021-03-19 NOTE — Progress Notes (Signed)
? ? ?  The patient is scheduled for a TEE today at approximately 1030 with Dr. Harl Bowie. The procedure including risks and benefits reviewed yesterday as previously documented and he is in agreement to proceed. K+ has improved to 3.6 this AM following supplementation. Will give another 20 mEq today since he is at the low-end of normal.  ? ?Signed, ?Erma Heritage, PA-C ?03/19/2021, 7:50 AM ? ? ?

## 2021-03-19 NOTE — Discharge Instructions (Signed)
Please go to dialysis tomorrow as scheduled ?Take antibiotic at every dialysis treatment thru April 29, 2021.  ?

## 2021-03-19 NOTE — Progress Notes (Signed)
PHARMACY CONSULT NOTE FOR: ? ?OUTPATIENT  PARENTERAL ANTIBIOTIC THERAPY (OPAT) ? ?Indication: MSSA Bacteremia ?Regimen: Cefazolin 2gm IV after HD MWF ?End date: 04/29/21 ? ?IV antibiotic discharge orders are pended. ?To discharging provider:  please sign these orders via discharge navigator,  ?Select New Orders & click on the button choice - Manage This Unsigned Work.  ?  ? ?Thank you for allowing pharmacy to be a part of this patient's care. ? ?Isac Sarna, BS Pharm D, BCPS ?Clinical Pharmacist ?Pager [phone] ?03/19/2021, 12:33 PM ?

## 2021-03-19 NOTE — Transfer of Care (Signed)
Immediate Anesthesia Transfer of Care Note ? ?Patient: Paul Gonzalez ? ?Procedure(s) Performed: TRANSESOPHAGEAL ECHOCARDIOGRAM (TEE) ? ?Patient Location: PACU ? ?Anesthesia Type:General ? ?Level of Consciousness: awake and patient cooperative ? ?Airway & Oxygen Therapy: Patient Spontanous Breathing and Patient connected to nasal cannula oxygen ? ?Post-op Assessment: Report given to RN and Post -op Vital signs reviewed and stable ? ?Post vital signs: Reviewed and stable ? ?Last Vitals:  ?Vitals Value Taken Time  ?BP 122/94 1107  ?Temp 99.3 1107  ?Pulse 61 03/19/21 1106  ?Resp 28 03/19/21 1106  ?SpO2 99 % 03/19/21 1106  ?Vitals shown include unvalidated device data. ? ?Last Pain:  ?Vitals:  ? 03/19/21 0919  ?TempSrc: Oral  ?PainSc: 4   ?   ? ?Patients Stated Pain Goal: 8 (03/19/21 0919) ? ?Complications: No notable events documented. ?
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
## 2021-03-20 ENCOUNTER — Telehealth: Payer: Self-pay

## 2021-03-20 DIAGNOSIS — N186 End stage renal disease: Secondary | ICD-10-CM | POA: Diagnosis not present

## 2021-03-20 DIAGNOSIS — Z992 Dependence on renal dialysis: Secondary | ICD-10-CM | POA: Diagnosis not present

## 2021-03-20 DIAGNOSIS — N2581 Secondary hyperparathyroidism of renal origin: Secondary | ICD-10-CM | POA: Diagnosis not present

## 2021-03-20 DIAGNOSIS — T827XXA Infection and inflammatory reaction due to other cardiac and vascular devices, implants and grafts, initial encounter: Secondary | ICD-10-CM | POA: Diagnosis not present

## 2021-03-20 DIAGNOSIS — D631 Anemia in chronic kidney disease: Secondary | ICD-10-CM | POA: Diagnosis not present

## 2021-03-20 DIAGNOSIS — D509 Iron deficiency anemia, unspecified: Secondary | ICD-10-CM | POA: Diagnosis not present

## 2021-03-20 DIAGNOSIS — Z5181 Encounter for therapeutic drug level monitoring: Secondary | ICD-10-CM | POA: Diagnosis not present

## 2021-03-20 DIAGNOSIS — Z79899 Other long term (current) drug therapy: Secondary | ICD-10-CM | POA: Diagnosis not present

## 2021-03-20 NOTE — Telephone Encounter (Signed)
Transition Care Management Follow-up Telephone Call ?Date of discharge and from where: 03/19/21 [facility] ?How have you been since you were released from the hospital? Spoke with pt's POA, Paul Gonzalez, and she states pt is still weak. ?Any questions or concerns? No ? ?Items Reviewed: ?Did the pt receive and understand the discharge instructions provided? Yes  ?Medications obtained and verified? Yes  ?Other? No  ?Any new allergies since your discharge? No  ?Dietary orders reviewed? Yes ?Do you have support at home? Yes  ? ?Home Care and Equipment/Supplies: ?Were home health services ordered? no ?If so, what is the name of the agency? N/A  ?Has the agency set up a time to come to the patient's home? not applicable ?Were any new equipment or medical supplies ordered?  No ?What is the name of the medical supply agency? N/A ?Were you able to get the supplies/equipment? not applicable ?Do you have any questions related to the use of the equipment or supplies? No ? ?Functional Questionnaire: (I = Independent and D = Dependent) ?ADLs: I ? ?Bathing/Dressing- I ? ?Meal Prep- I ? ?Eating- I ? ?Maintaining continence- I ? ?Transferring/Ambulation- I ? ?Managing Meds- I ? ?Follow up appointments reviewed: ? ?PCP Hospital f/u appt confirmed? Yes  Scheduled to see Dr. Lacinda Axon on 03/31/21 @ 2:30. ?[facility] f/u appt confirmed?  N/A   ?Are transportation arrangements needed? No  ?If their condition worsens, is the pt aware to call PCP or go to the Emergency Dept.? Yes ?Was the patient provided with contact information for the PCP's office or ED? Yes ?Was to pt encouraged to call back with questions or concerns? Yes ? ?

## 2021-03-20 NOTE — Telephone Encounter (Signed)
Called patient to schedule appointment, will need to call back after his dialysis today.  ? ?Beryle Flock, RN ? ?

## 2021-03-22 LAB — CULTURE, BLOOD (ROUTINE X 2)
Culture: NO GROWTH
Culture: NO GROWTH
Special Requests: ADEQUATE
Special Requests: ADEQUATE

## 2021-03-23 DIAGNOSIS — D631 Anemia in chronic kidney disease: Secondary | ICD-10-CM | POA: Diagnosis not present

## 2021-03-23 DIAGNOSIS — D509 Iron deficiency anemia, unspecified: Secondary | ICD-10-CM | POA: Diagnosis not present

## 2021-03-23 DIAGNOSIS — N2581 Secondary hyperparathyroidism of renal origin: Secondary | ICD-10-CM | POA: Diagnosis not present

## 2021-03-23 DIAGNOSIS — Z992 Dependence on renal dialysis: Secondary | ICD-10-CM | POA: Diagnosis not present

## 2021-03-23 DIAGNOSIS — T827XXA Infection and inflammatory reaction due to other cardiac and vascular devices, implants and grafts, initial encounter: Secondary | ICD-10-CM | POA: Diagnosis not present

## 2021-03-23 DIAGNOSIS — N186 End stage renal disease: Secondary | ICD-10-CM | POA: Diagnosis not present

## 2021-03-24 ENCOUNTER — Encounter (HOSPITAL_COMMUNITY): Payer: Self-pay | Admitting: Cardiology

## 2021-03-24 LAB — CULTURE, BLOOD (ROUTINE X 2)
Culture: NO GROWTH
Culture: NO GROWTH
Special Requests: ADEQUATE
Special Requests: ADEQUATE

## 2021-03-24 NOTE — Telephone Encounter (Signed)
?  Spoke with patient and accepts appointment in [facility] for hospital f/u visit and blood cultures with Dr. Tommy Medal ?Paul Gonzalez ? ?

## 2021-03-25 DIAGNOSIS — D509 Iron deficiency anemia, unspecified: Secondary | ICD-10-CM | POA: Diagnosis not present

## 2021-03-25 DIAGNOSIS — T827XXA Infection and inflammatory reaction due to other cardiac and vascular devices, implants and grafts, initial encounter: Secondary | ICD-10-CM | POA: Diagnosis not present

## 2021-03-25 DIAGNOSIS — D631 Anemia in chronic kidney disease: Secondary | ICD-10-CM | POA: Diagnosis not present

## 2021-03-25 DIAGNOSIS — N2581 Secondary hyperparathyroidism of renal origin: Secondary | ICD-10-CM | POA: Diagnosis not present

## 2021-03-25 DIAGNOSIS — N186 End stage renal disease: Secondary | ICD-10-CM | POA: Diagnosis not present

## 2021-03-25 DIAGNOSIS — Z992 Dependence on renal dialysis: Secondary | ICD-10-CM | POA: Diagnosis not present

## 2021-03-27 ENCOUNTER — Other Ambulatory Visit: Payer: Self-pay | Admitting: Family Medicine

## 2021-03-27 DIAGNOSIS — D509 Iron deficiency anemia, unspecified: Secondary | ICD-10-CM | POA: Diagnosis not present

## 2021-03-27 DIAGNOSIS — F3341 Major depressive disorder, recurrent, in partial remission: Secondary | ICD-10-CM

## 2021-03-27 DIAGNOSIS — N186 End stage renal disease: Secondary | ICD-10-CM | POA: Diagnosis not present

## 2021-03-27 DIAGNOSIS — T827XXA Infection and inflammatory reaction due to other cardiac and vascular devices, implants and grafts, initial encounter: Secondary | ICD-10-CM | POA: Diagnosis not present

## 2021-03-27 DIAGNOSIS — D631 Anemia in chronic kidney disease: Secondary | ICD-10-CM | POA: Diagnosis not present

## 2021-03-27 DIAGNOSIS — Z992 Dependence on renal dialysis: Secondary | ICD-10-CM | POA: Diagnosis not present

## 2021-03-27 DIAGNOSIS — N2581 Secondary hyperparathyroidism of renal origin: Secondary | ICD-10-CM | POA: Diagnosis not present

## 2021-03-30 DIAGNOSIS — D631 Anemia in chronic kidney disease: Secondary | ICD-10-CM | POA: Diagnosis not present

## 2021-03-30 DIAGNOSIS — N2581 Secondary hyperparathyroidism of renal origin: Secondary | ICD-10-CM | POA: Diagnosis not present

## 2021-03-30 DIAGNOSIS — T827XXA Infection and inflammatory reaction due to other cardiac and vascular devices, implants and grafts, initial encounter: Secondary | ICD-10-CM | POA: Diagnosis not present

## 2021-03-30 DIAGNOSIS — Z992 Dependence on renal dialysis: Secondary | ICD-10-CM | POA: Diagnosis not present

## 2021-03-30 DIAGNOSIS — N186 End stage renal disease: Secondary | ICD-10-CM | POA: Diagnosis not present

## 2021-03-30 DIAGNOSIS — D509 Iron deficiency anemia, unspecified: Secondary | ICD-10-CM | POA: Diagnosis not present

## 2021-03-31 ENCOUNTER — Other Ambulatory Visit: Payer: Self-pay

## 2021-03-31 ENCOUNTER — Encounter: Payer: Self-pay | Admitting: Family Medicine

## 2021-03-31 ENCOUNTER — Ambulatory Visit (INDEPENDENT_AMBULATORY_CARE_PROVIDER_SITE_OTHER): Payer: Medicare Other | Admitting: Family Medicine

## 2021-03-31 VITALS — BP 113/57 | HR 93 | Temp 98.9°F | Wt 175.0 lb

## 2021-03-31 DIAGNOSIS — M542 Cervicalgia: Secondary | ICD-10-CM

## 2021-03-31 DIAGNOSIS — B9561 Methicillin susceptible Staphylococcus aureus infection as the cause of diseases classified elsewhere: Secondary | ICD-10-CM | POA: Diagnosis not present

## 2021-03-31 DIAGNOSIS — R7881 Bacteremia: Secondary | ICD-10-CM

## 2021-03-31 MED ORDER — CYCLOBENZAPRINE HCL 5 MG PO TABS: 5.0000 mg | ORAL_TABLET | Freq: Two times a day (BID) | ORAL | 0 refills | Status: DC | PRN

## 2021-03-31 NOTE — Patient Instructions (Addendum)
Medication as prescribed. ? ?Lots of heat. ? ?Follow up in 3-6 months. ? ?Take care ? ?Dr. Lacinda Axon  ?

## 2021-04-01 DIAGNOSIS — T827XXA Infection and inflammatory reaction due to other cardiac and vascular devices, implants and grafts, initial encounter: Secondary | ICD-10-CM | POA: Diagnosis not present

## 2021-04-01 DIAGNOSIS — M542 Cervicalgia: Secondary | ICD-10-CM | POA: Insufficient documentation

## 2021-04-01 DIAGNOSIS — Z992 Dependence on renal dialysis: Secondary | ICD-10-CM | POA: Diagnosis not present

## 2021-04-01 DIAGNOSIS — N186 End stage renal disease: Secondary | ICD-10-CM | POA: Diagnosis not present

## 2021-04-01 DIAGNOSIS — D631 Anemia in chronic kidney disease: Secondary | ICD-10-CM | POA: Diagnosis not present

## 2021-04-01 DIAGNOSIS — D509 Iron deficiency anemia, unspecified: Secondary | ICD-10-CM | POA: Diagnosis not present

## 2021-04-01 DIAGNOSIS — N2581 Secondary hyperparathyroidism of renal origin: Secondary | ICD-10-CM | POA: Diagnosis not present

## 2021-04-01 NOTE — Assessment & Plan Note (Signed)
Advise heat.  Flexeril to be used sparingly. ?

## 2021-04-01 NOTE — Assessment & Plan Note (Signed)
Patient is to continue IV antibiotics until 4/26.  He is stable at this time. ?

## 2021-04-01 NOTE — Progress Notes (Signed)
? ?Subjective:  ?Patient ID: Paul Gonzalez, male    DOB: 1965/07/14  Age: 56 y.o. MRN: [id number] ? ?CC: ?Chief Complaint  ?Patient presents with  ? Hospitalization Follow-up  ?  TOC- patient in hospital for staph infection MSSA- doing some better but a slow process  ? ? ?HPI: ? ?56 year old male with ESRD on hemodialysis Monday, Wednesday, Friday, hypertension, anemia chronic disease, type 2 diabetes with complications, hyperlipidemia presents for hospital follow-up. ? ?Recently admitted to the hospital from 3/13 to 3/16.  Hospital course and discharge summary reviewed. ?In summary: Presented to the ER with nausea, vomiting, diarrhea as well as fatigue and weakness.  He had fever as well.  CT abdomen and pelvis was negative.  Blood cultures were obtained and were positive.  Cultures grew MSSA.  He was placed on IV vancomycin in the ED and was later transitioned to Ancef.  Had a TEE which was negative. ? ?Patient reports that he is still not feeling well but his appetite is returning.  He is to continue IV antibiotics until 4/26.  No recent fever.  Patient reports that he has been having some neck pain.  His wife attributes this to him spending so much time in the bed lying down.  He localizes the pain to the right side of the neck.  Sharp pain. ? ?Patient Active Problem List  ? Diagnosis Date Noted  ? Neck pain 04/01/2021  ? MSSA bacteremia 03/16/2021  ? ESRD (end stage renal disease) on dialysis ([place]) 12/04/2020  ? Legally blind 12/04/2020  ? Diabetic retinopathy/legally blind 12/04/2020  ? Depression 01/21/2014  ? Anemia in CKD (chronic kidney disease) 12/11/2013  ? Diabetic neuropathy, type II diabetes mellitus ([place]) 04/10/2013  ? HTN (hypertension), benign 01/05/2013  ? Type 2 diabetes mellitus with complications ([place]) 53/29/9242  ? Hyperlipidemia 01/05/2013  ? ? ?Social Hx   ?Social History  ? ?Socioeconomic History  ? Marital status: Significant Other  ?  Spouse name: [name]  ? Number of children: 0   ? Years of education: Not on file  ? Highest education level: Not on file  ?Occupational History  ? Not on file  ?Tobacco Use  ? Smoking status: Never  ? Smokeless tobacco: Never  ?Substance and Sexual Activity  ? Alcohol use: Yes  ?  Comment: rarely  ? Drug use: No  ? Sexual activity: Not on file  ?Other Topics Concern  ? Not on file  ?Social History Narrative  ? ** Merged History Encounter **   ? Gregary Signs and Ronalee Belts have been together x 25 years in 2022.  ? ?Social Determinants of Health  ? ?Financial Resource Strain: Medium Risk  ? Difficulty of Paying Living Expenses: Somewhat hard  ?Food Insecurity: No Food Insecurity  ? Worried About Charity fundraiser in the Last Year: Never true  ? Ran Out of Food in the Last Year: Never true  ?Transportation Needs: No Transportation Needs  ? Lack of Transportation (Medical): No  ? Lack of Transportation (Non-Medical): No  ?Physical Activity: Sufficiently Active  ? Days of Exercise per Week: 5 days  ? Minutes of Exercise per Session: 60 min  ?Stress: Stress Concern Present  ? Feeling of Stress : To some extent  ?Social Connections: Moderately Isolated  ? Frequency of Communication with Friends and Family: More than three times a week  ? Frequency of Social Gatherings with Friends and Family: More than three times a week  ? Attends Religious Services: Never  ? Active  Member of Clubs or Organizations: No  ? Attends Archivist Meetings: Never  ? Marital Status: Married  ? ? ?Review of Systems ?Per HPI ? ?Objective:  ?BP (!) 113/57   Pulse 93   Temp 98.9 ?F (37.2 ?C)   Wt 175 lb (79.4 kg)   SpO2 99%   BMI 26.61 kg/m?  ? ? ?  03/31/2021  ?  2:27 PM 03/19/2021  ? 12:00 PM 03/19/2021  ? 11:30 AM  ?BP/Weight  ?Systolic BP 088 110 315  ?Diastolic BP 57 71 63  ?Wt. (Lbs) 175    ?BMI 26.61 kg/m2    ? ? ?Physical Exam ?Vitals and nursing note reviewed.  ?Constitutional:   ?   General: He is not in acute distress. ?   Comments: Chronically ill-appearing.  ?Eyes:  ?   General:      ?   Right eye: No discharge.     ?   Left eye: No discharge.  ?   Conjunctiva/sclera: Conjunctivae normal.  ?Cardiovascular:  ?   Rate and Rhythm: Normal rate and regular rhythm.  ?   Heart sounds: Murmur heard.  ?Pulmonary:  ?   Effort: Pulmonary effort is normal.  ?   Breath sounds: Normal breath sounds. No wheezing, rhonchi or rales.  ?Abdominal:  ?   General: There is no distension.  ?   Palpations: Abdomen is soft.  ?   Tenderness: There is no abdominal tenderness.  ?Neurological:  ?   Mental Status: He is alert.  ?Psychiatric:     ?   Mood and Affect: Mood normal.     ?   Behavior: Behavior normal.  ? ? ?Lab Results  ?Component Value Date  ? WBC 8.5 03/18/2021  ? HGB 8.7 (L) 03/18/2021  ? HCT 25.8 (L) 03/18/2021  ? PLT 92 (L) 03/18/2021  ? GLUCOSE 176 (H) 03/19/2021  ? CHOL 132 12/04/2020  ? TRIG 182 (H) 12/04/2020  ? HDL 36 (L) 12/04/2020  ? [place] 65 12/04/2020  ? ALT 81 (H) 03/17/2021  ? AST 70 (H) 03/17/2021  ? NA 133 (L) 03/19/2021  ? K 3.6 03/19/2021  ? CL 95 (L) 03/19/2021  ? CREATININE 5.33 (H) 03/19/2021  ? BUN 50 (H) 03/19/2021  ? CO2 25 03/19/2021  ? INR 1.16 03/02/2013  ? HGBA1C 6.6 (H) 03/16/2021  ? ? ? ?Assessment & Plan:  ? ?Problem List Items Addressed This Visit   ? ?  ? Other  ? Neck pain  ?  Advise heat.  Flexeril to be used sparingly. ?  ?  ? MSSA bacteremia - Primary  ?  Patient is to continue IV antibiotics until 4/26.  He is stable at this time. ?  ?  ? ? ?Meds ordered this encounter  ?Medications  ? cyclobenzaprine (FLEXERIL) 5 MG tablet  ?  Sig: Take 1 tablet (5 mg total) by mouth 2 (two) times daily as needed for muscle spasms.  ?  Dispense:  30 tablet  ?  Refill:  0  ? ?Thersa Salt DO ?[place] ? ?

## 2021-04-03 DIAGNOSIS — T827XXA Infection and inflammatory reaction due to other cardiac and vascular devices, implants and grafts, initial encounter: Secondary | ICD-10-CM | POA: Diagnosis not present

## 2021-04-03 DIAGNOSIS — N2581 Secondary hyperparathyroidism of renal origin: Secondary | ICD-10-CM | POA: Diagnosis not present

## 2021-04-03 DIAGNOSIS — D509 Iron deficiency anemia, unspecified: Secondary | ICD-10-CM | POA: Diagnosis not present

## 2021-04-03 DIAGNOSIS — N186 End stage renal disease: Secondary | ICD-10-CM | POA: Diagnosis not present

## 2021-04-03 DIAGNOSIS — Z992 Dependence on renal dialysis: Secondary | ICD-10-CM | POA: Diagnosis not present

## 2021-04-03 DIAGNOSIS — D631 Anemia in chronic kidney disease: Secondary | ICD-10-CM | POA: Diagnosis not present

## 2021-04-04 DIAGNOSIS — N186 End stage renal disease: Secondary | ICD-10-CM | POA: Diagnosis not present

## 2021-04-04 DIAGNOSIS — T827XXA Infection and inflammatory reaction due to other cardiac and vascular devices, implants and grafts, initial encounter: Secondary | ICD-10-CM | POA: Diagnosis not present

## 2021-04-04 DIAGNOSIS — D631 Anemia in chronic kidney disease: Secondary | ICD-10-CM | POA: Diagnosis not present

## 2021-04-04 DIAGNOSIS — Z992 Dependence on renal dialysis: Secondary | ICD-10-CM | POA: Diagnosis not present

## 2021-04-04 DIAGNOSIS — N250 Renal osteodystrophy: Secondary | ICD-10-CM | POA: Diagnosis not present

## 2021-04-04 DIAGNOSIS — D509 Iron deficiency anemia, unspecified: Secondary | ICD-10-CM | POA: Diagnosis not present

## 2021-04-04 DIAGNOSIS — N2581 Secondary hyperparathyroidism of renal origin: Secondary | ICD-10-CM | POA: Diagnosis not present

## 2021-04-06 DIAGNOSIS — T827XXA Infection and inflammatory reaction due to other cardiac and vascular devices, implants and grafts, initial encounter: Secondary | ICD-10-CM | POA: Diagnosis not present

## 2021-04-06 DIAGNOSIS — Z992 Dependence on renal dialysis: Secondary | ICD-10-CM | POA: Diagnosis not present

## 2021-04-06 DIAGNOSIS — N186 End stage renal disease: Secondary | ICD-10-CM | POA: Diagnosis not present

## 2021-04-06 DIAGNOSIS — D631 Anemia in chronic kidney disease: Secondary | ICD-10-CM | POA: Diagnosis not present

## 2021-04-06 DIAGNOSIS — N2581 Secondary hyperparathyroidism of renal origin: Secondary | ICD-10-CM | POA: Diagnosis not present

## 2021-04-06 DIAGNOSIS — D509 Iron deficiency anemia, unspecified: Secondary | ICD-10-CM | POA: Diagnosis not present

## 2021-04-08 DIAGNOSIS — D509 Iron deficiency anemia, unspecified: Secondary | ICD-10-CM | POA: Diagnosis not present

## 2021-04-08 DIAGNOSIS — N2581 Secondary hyperparathyroidism of renal origin: Secondary | ICD-10-CM | POA: Diagnosis not present

## 2021-04-08 DIAGNOSIS — Z992 Dependence on renal dialysis: Secondary | ICD-10-CM | POA: Diagnosis not present

## 2021-04-08 DIAGNOSIS — D631 Anemia in chronic kidney disease: Secondary | ICD-10-CM | POA: Diagnosis not present

## 2021-04-08 DIAGNOSIS — T827XXA Infection and inflammatory reaction due to other cardiac and vascular devices, implants and grafts, initial encounter: Secondary | ICD-10-CM | POA: Diagnosis not present

## 2021-04-08 DIAGNOSIS — N186 End stage renal disease: Secondary | ICD-10-CM | POA: Diagnosis not present

## 2021-04-10 DIAGNOSIS — D509 Iron deficiency anemia, unspecified: Secondary | ICD-10-CM | POA: Diagnosis not present

## 2021-04-10 DIAGNOSIS — N186 End stage renal disease: Secondary | ICD-10-CM | POA: Diagnosis not present

## 2021-04-10 DIAGNOSIS — T827XXA Infection and inflammatory reaction due to other cardiac and vascular devices, implants and grafts, initial encounter: Secondary | ICD-10-CM | POA: Diagnosis not present

## 2021-04-10 DIAGNOSIS — D631 Anemia in chronic kidney disease: Secondary | ICD-10-CM | POA: Diagnosis not present

## 2021-04-10 DIAGNOSIS — Z992 Dependence on renal dialysis: Secondary | ICD-10-CM | POA: Diagnosis not present

## 2021-04-10 DIAGNOSIS — N2581 Secondary hyperparathyroidism of renal origin: Secondary | ICD-10-CM | POA: Diagnosis not present

## 2021-04-13 DIAGNOSIS — D509 Iron deficiency anemia, unspecified: Secondary | ICD-10-CM | POA: Diagnosis not present

## 2021-04-13 DIAGNOSIS — D631 Anemia in chronic kidney disease: Secondary | ICD-10-CM | POA: Diagnosis not present

## 2021-04-13 DIAGNOSIS — E119 Type 2 diabetes mellitus without complications: Secondary | ICD-10-CM | POA: Diagnosis not present

## 2021-04-13 DIAGNOSIS — N186 End stage renal disease: Secondary | ICD-10-CM | POA: Diagnosis not present

## 2021-04-13 DIAGNOSIS — N2581 Secondary hyperparathyroidism of renal origin: Secondary | ICD-10-CM | POA: Diagnosis not present

## 2021-04-13 DIAGNOSIS — Z992 Dependence on renal dialysis: Secondary | ICD-10-CM | POA: Diagnosis not present

## 2021-04-13 DIAGNOSIS — T827XXA Infection and inflammatory reaction due to other cardiac and vascular devices, implants and grafts, initial encounter: Secondary | ICD-10-CM | POA: Diagnosis not present

## 2021-04-15 DIAGNOSIS — D509 Iron deficiency anemia, unspecified: Secondary | ICD-10-CM | POA: Diagnosis not present

## 2021-04-15 DIAGNOSIS — D631 Anemia in chronic kidney disease: Secondary | ICD-10-CM | POA: Diagnosis not present

## 2021-04-15 DIAGNOSIS — N186 End stage renal disease: Secondary | ICD-10-CM | POA: Diagnosis not present

## 2021-04-15 DIAGNOSIS — N2581 Secondary hyperparathyroidism of renal origin: Secondary | ICD-10-CM | POA: Diagnosis not present

## 2021-04-15 DIAGNOSIS — T827XXA Infection and inflammatory reaction due to other cardiac and vascular devices, implants and grafts, initial encounter: Secondary | ICD-10-CM | POA: Diagnosis not present

## 2021-04-15 DIAGNOSIS — Z992 Dependence on renal dialysis: Secondary | ICD-10-CM | POA: Diagnosis not present

## 2021-04-16 ENCOUNTER — Other Ambulatory Visit: Payer: Self-pay | Admitting: Nurse Practitioner

## 2021-04-17 DIAGNOSIS — N2581 Secondary hyperparathyroidism of renal origin: Secondary | ICD-10-CM | POA: Diagnosis not present

## 2021-04-17 DIAGNOSIS — N186 End stage renal disease: Secondary | ICD-10-CM | POA: Diagnosis not present

## 2021-04-17 DIAGNOSIS — D509 Iron deficiency anemia, unspecified: Secondary | ICD-10-CM | POA: Diagnosis not present

## 2021-04-17 DIAGNOSIS — D631 Anemia in chronic kidney disease: Secondary | ICD-10-CM | POA: Diagnosis not present

## 2021-04-17 DIAGNOSIS — T827XXA Infection and inflammatory reaction due to other cardiac and vascular devices, implants and grafts, initial encounter: Secondary | ICD-10-CM | POA: Diagnosis not present

## 2021-04-17 DIAGNOSIS — Z992 Dependence on renal dialysis: Secondary | ICD-10-CM | POA: Diagnosis not present

## 2021-04-20 DIAGNOSIS — N186 End stage renal disease: Secondary | ICD-10-CM | POA: Diagnosis not present

## 2021-04-20 DIAGNOSIS — Z992 Dependence on renal dialysis: Secondary | ICD-10-CM | POA: Diagnosis not present

## 2021-04-20 DIAGNOSIS — N2581 Secondary hyperparathyroidism of renal origin: Secondary | ICD-10-CM | POA: Diagnosis not present

## 2021-04-20 DIAGNOSIS — D509 Iron deficiency anemia, unspecified: Secondary | ICD-10-CM | POA: Diagnosis not present

## 2021-04-20 DIAGNOSIS — D631 Anemia in chronic kidney disease: Secondary | ICD-10-CM | POA: Diagnosis not present

## 2021-04-20 DIAGNOSIS — T827XXA Infection and inflammatory reaction due to other cardiac and vascular devices, implants and grafts, initial encounter: Secondary | ICD-10-CM | POA: Diagnosis not present

## 2021-04-22 DIAGNOSIS — D631 Anemia in chronic kidney disease: Secondary | ICD-10-CM | POA: Diagnosis not present

## 2021-04-22 DIAGNOSIS — N186 End stage renal disease: Secondary | ICD-10-CM | POA: Diagnosis not present

## 2021-04-22 DIAGNOSIS — D509 Iron deficiency anemia, unspecified: Secondary | ICD-10-CM | POA: Diagnosis not present

## 2021-04-22 DIAGNOSIS — T827XXA Infection and inflammatory reaction due to other cardiac and vascular devices, implants and grafts, initial encounter: Secondary | ICD-10-CM | POA: Diagnosis not present

## 2021-04-22 DIAGNOSIS — Z992 Dependence on renal dialysis: Secondary | ICD-10-CM | POA: Diagnosis not present

## 2021-04-22 DIAGNOSIS — N2581 Secondary hyperparathyroidism of renal origin: Secondary | ICD-10-CM | POA: Diagnosis not present

## 2021-04-24 DIAGNOSIS — T827XXA Infection and inflammatory reaction due to other cardiac and vascular devices, implants and grafts, initial encounter: Secondary | ICD-10-CM | POA: Diagnosis not present

## 2021-04-24 DIAGNOSIS — D631 Anemia in chronic kidney disease: Secondary | ICD-10-CM | POA: Diagnosis not present

## 2021-04-24 DIAGNOSIS — D509 Iron deficiency anemia, unspecified: Secondary | ICD-10-CM | POA: Diagnosis not present

## 2021-04-24 DIAGNOSIS — Z992 Dependence on renal dialysis: Secondary | ICD-10-CM | POA: Diagnosis not present

## 2021-04-24 DIAGNOSIS — N186 End stage renal disease: Secondary | ICD-10-CM | POA: Diagnosis not present

## 2021-04-24 DIAGNOSIS — N2581 Secondary hyperparathyroidism of renal origin: Secondary | ICD-10-CM | POA: Diagnosis not present

## 2021-04-27 DIAGNOSIS — T827XXA Infection and inflammatory reaction due to other cardiac and vascular devices, implants and grafts, initial encounter: Secondary | ICD-10-CM | POA: Diagnosis not present

## 2021-04-27 DIAGNOSIS — D631 Anemia in chronic kidney disease: Secondary | ICD-10-CM | POA: Diagnosis not present

## 2021-04-27 DIAGNOSIS — N2581 Secondary hyperparathyroidism of renal origin: Secondary | ICD-10-CM | POA: Diagnosis not present

## 2021-04-27 DIAGNOSIS — Z992 Dependence on renal dialysis: Secondary | ICD-10-CM | POA: Diagnosis not present

## 2021-04-27 DIAGNOSIS — D509 Iron deficiency anemia, unspecified: Secondary | ICD-10-CM | POA: Diagnosis not present

## 2021-04-27 DIAGNOSIS — N186 End stage renal disease: Secondary | ICD-10-CM | POA: Diagnosis not present

## 2021-04-29 DIAGNOSIS — D631 Anemia in chronic kidney disease: Secondary | ICD-10-CM | POA: Diagnosis not present

## 2021-04-29 DIAGNOSIS — N2581 Secondary hyperparathyroidism of renal origin: Secondary | ICD-10-CM | POA: Diagnosis not present

## 2021-04-29 DIAGNOSIS — Z992 Dependence on renal dialysis: Secondary | ICD-10-CM | POA: Diagnosis not present

## 2021-04-29 DIAGNOSIS — N186 End stage renal disease: Secondary | ICD-10-CM | POA: Diagnosis not present

## 2021-04-29 DIAGNOSIS — D509 Iron deficiency anemia, unspecified: Secondary | ICD-10-CM | POA: Diagnosis not present

## 2021-04-29 DIAGNOSIS — T827XXA Infection and inflammatory reaction due to other cardiac and vascular devices, implants and grafts, initial encounter: Secondary | ICD-10-CM | POA: Diagnosis not present

## 2021-05-01 DIAGNOSIS — T827XXA Infection and inflammatory reaction due to other cardiac and vascular devices, implants and grafts, initial encounter: Secondary | ICD-10-CM | POA: Diagnosis not present

## 2021-05-01 DIAGNOSIS — N186 End stage renal disease: Secondary | ICD-10-CM | POA: Diagnosis not present

## 2021-05-01 DIAGNOSIS — D631 Anemia in chronic kidney disease: Secondary | ICD-10-CM | POA: Diagnosis not present

## 2021-05-01 DIAGNOSIS — Z992 Dependence on renal dialysis: Secondary | ICD-10-CM | POA: Diagnosis not present

## 2021-05-01 DIAGNOSIS — N2581 Secondary hyperparathyroidism of renal origin: Secondary | ICD-10-CM | POA: Diagnosis not present

## 2021-05-01 DIAGNOSIS — D509 Iron deficiency anemia, unspecified: Secondary | ICD-10-CM | POA: Diagnosis not present

## 2021-05-03 DIAGNOSIS — N186 End stage renal disease: Secondary | ICD-10-CM | POA: Diagnosis not present

## 2021-05-03 DIAGNOSIS — Z992 Dependence on renal dialysis: Secondary | ICD-10-CM | POA: Diagnosis not present

## 2021-05-04 DIAGNOSIS — D509 Iron deficiency anemia, unspecified: Secondary | ICD-10-CM | POA: Diagnosis not present

## 2021-05-04 DIAGNOSIS — N2581 Secondary hyperparathyroidism of renal origin: Secondary | ICD-10-CM | POA: Diagnosis not present

## 2021-05-04 DIAGNOSIS — N186 End stage renal disease: Secondary | ICD-10-CM | POA: Diagnosis not present

## 2021-05-04 DIAGNOSIS — Z992 Dependence on renal dialysis: Secondary | ICD-10-CM | POA: Diagnosis not present

## 2021-05-04 DIAGNOSIS — N250 Renal osteodystrophy: Secondary | ICD-10-CM | POA: Diagnosis not present

## 2021-05-04 DIAGNOSIS — D631 Anemia in chronic kidney disease: Secondary | ICD-10-CM | POA: Diagnosis not present

## 2021-05-06 DIAGNOSIS — N2581 Secondary hyperparathyroidism of renal origin: Secondary | ICD-10-CM | POA: Diagnosis not present

## 2021-05-06 DIAGNOSIS — D509 Iron deficiency anemia, unspecified: Secondary | ICD-10-CM | POA: Diagnosis not present

## 2021-05-06 DIAGNOSIS — N250 Renal osteodystrophy: Secondary | ICD-10-CM | POA: Diagnosis not present

## 2021-05-06 DIAGNOSIS — N186 End stage renal disease: Secondary | ICD-10-CM | POA: Diagnosis not present

## 2021-05-06 DIAGNOSIS — Z992 Dependence on renal dialysis: Secondary | ICD-10-CM | POA: Diagnosis not present

## 2021-05-06 DIAGNOSIS — D631 Anemia in chronic kidney disease: Secondary | ICD-10-CM | POA: Diagnosis not present

## 2021-05-08 DIAGNOSIS — N250 Renal osteodystrophy: Secondary | ICD-10-CM | POA: Diagnosis not present

## 2021-05-08 DIAGNOSIS — D631 Anemia in chronic kidney disease: Secondary | ICD-10-CM | POA: Diagnosis not present

## 2021-05-08 DIAGNOSIS — D509 Iron deficiency anemia, unspecified: Secondary | ICD-10-CM | POA: Diagnosis not present

## 2021-05-08 DIAGNOSIS — Z992 Dependence on renal dialysis: Secondary | ICD-10-CM | POA: Diagnosis not present

## 2021-05-08 DIAGNOSIS — N2581 Secondary hyperparathyroidism of renal origin: Secondary | ICD-10-CM | POA: Diagnosis not present

## 2021-05-08 DIAGNOSIS — N186 End stage renal disease: Secondary | ICD-10-CM | POA: Diagnosis not present

## 2021-05-11 DIAGNOSIS — D509 Iron deficiency anemia, unspecified: Secondary | ICD-10-CM | POA: Diagnosis not present

## 2021-05-11 DIAGNOSIS — N186 End stage renal disease: Secondary | ICD-10-CM | POA: Diagnosis not present

## 2021-05-11 DIAGNOSIS — Z992 Dependence on renal dialysis: Secondary | ICD-10-CM | POA: Diagnosis not present

## 2021-05-11 DIAGNOSIS — D631 Anemia in chronic kidney disease: Secondary | ICD-10-CM | POA: Diagnosis not present

## 2021-05-11 DIAGNOSIS — N250 Renal osteodystrophy: Secondary | ICD-10-CM | POA: Diagnosis not present

## 2021-05-11 DIAGNOSIS — N2581 Secondary hyperparathyroidism of renal origin: Secondary | ICD-10-CM | POA: Diagnosis not present

## 2021-05-13 DIAGNOSIS — D509 Iron deficiency anemia, unspecified: Secondary | ICD-10-CM | POA: Diagnosis not present

## 2021-05-13 DIAGNOSIS — Z992 Dependence on renal dialysis: Secondary | ICD-10-CM | POA: Diagnosis not present

## 2021-05-13 DIAGNOSIS — D631 Anemia in chronic kidney disease: Secondary | ICD-10-CM | POA: Diagnosis not present

## 2021-05-13 DIAGNOSIS — N186 End stage renal disease: Secondary | ICD-10-CM | POA: Diagnosis not present

## 2021-05-13 DIAGNOSIS — N250 Renal osteodystrophy: Secondary | ICD-10-CM | POA: Diagnosis not present

## 2021-05-13 DIAGNOSIS — N2581 Secondary hyperparathyroidism of renal origin: Secondary | ICD-10-CM | POA: Diagnosis not present

## 2021-05-15 DIAGNOSIS — N250 Renal osteodystrophy: Secondary | ICD-10-CM | POA: Diagnosis not present

## 2021-05-15 DIAGNOSIS — D631 Anemia in chronic kidney disease: Secondary | ICD-10-CM | POA: Diagnosis not present

## 2021-05-15 DIAGNOSIS — N2581 Secondary hyperparathyroidism of renal origin: Secondary | ICD-10-CM | POA: Diagnosis not present

## 2021-05-15 DIAGNOSIS — Z992 Dependence on renal dialysis: Secondary | ICD-10-CM | POA: Diagnosis not present

## 2021-05-15 DIAGNOSIS — D509 Iron deficiency anemia, unspecified: Secondary | ICD-10-CM | POA: Diagnosis not present

## 2021-05-15 DIAGNOSIS — N186 End stage renal disease: Secondary | ICD-10-CM | POA: Diagnosis not present

## 2021-05-18 DIAGNOSIS — N2581 Secondary hyperparathyroidism of renal origin: Secondary | ICD-10-CM | POA: Diagnosis not present

## 2021-05-18 DIAGNOSIS — D509 Iron deficiency anemia, unspecified: Secondary | ICD-10-CM | POA: Diagnosis not present

## 2021-05-18 DIAGNOSIS — N250 Renal osteodystrophy: Secondary | ICD-10-CM | POA: Diagnosis not present

## 2021-05-18 DIAGNOSIS — D631 Anemia in chronic kidney disease: Secondary | ICD-10-CM | POA: Diagnosis not present

## 2021-05-18 DIAGNOSIS — Z992 Dependence on renal dialysis: Secondary | ICD-10-CM | POA: Diagnosis not present

## 2021-05-18 DIAGNOSIS — N186 End stage renal disease: Secondary | ICD-10-CM | POA: Diagnosis not present

## 2021-05-20 DIAGNOSIS — D631 Anemia in chronic kidney disease: Secondary | ICD-10-CM | POA: Diagnosis not present

## 2021-05-20 DIAGNOSIS — D509 Iron deficiency anemia, unspecified: Secondary | ICD-10-CM | POA: Diagnosis not present

## 2021-05-20 DIAGNOSIS — N2581 Secondary hyperparathyroidism of renal origin: Secondary | ICD-10-CM | POA: Diagnosis not present

## 2021-05-20 DIAGNOSIS — Z992 Dependence on renal dialysis: Secondary | ICD-10-CM | POA: Diagnosis not present

## 2021-05-20 DIAGNOSIS — N186 End stage renal disease: Secondary | ICD-10-CM | POA: Diagnosis not present

## 2021-05-20 DIAGNOSIS — N250 Renal osteodystrophy: Secondary | ICD-10-CM | POA: Diagnosis not present

## 2021-05-22 DIAGNOSIS — D631 Anemia in chronic kidney disease: Secondary | ICD-10-CM | POA: Diagnosis not present

## 2021-05-22 DIAGNOSIS — N186 End stage renal disease: Secondary | ICD-10-CM | POA: Diagnosis not present

## 2021-05-22 DIAGNOSIS — N250 Renal osteodystrophy: Secondary | ICD-10-CM | POA: Diagnosis not present

## 2021-05-22 DIAGNOSIS — Z992 Dependence on renal dialysis: Secondary | ICD-10-CM | POA: Diagnosis not present

## 2021-05-22 DIAGNOSIS — N2581 Secondary hyperparathyroidism of renal origin: Secondary | ICD-10-CM | POA: Diagnosis not present

## 2021-05-22 DIAGNOSIS — D509 Iron deficiency anemia, unspecified: Secondary | ICD-10-CM | POA: Diagnosis not present

## 2021-05-25 DIAGNOSIS — D509 Iron deficiency anemia, unspecified: Secondary | ICD-10-CM | POA: Diagnosis not present

## 2021-05-25 DIAGNOSIS — N2581 Secondary hyperparathyroidism of renal origin: Secondary | ICD-10-CM | POA: Diagnosis not present

## 2021-05-25 DIAGNOSIS — Z992 Dependence on renal dialysis: Secondary | ICD-10-CM | POA: Diagnosis not present

## 2021-05-25 DIAGNOSIS — N186 End stage renal disease: Secondary | ICD-10-CM | POA: Diagnosis not present

## 2021-05-25 DIAGNOSIS — D631 Anemia in chronic kidney disease: Secondary | ICD-10-CM | POA: Diagnosis not present

## 2021-05-25 DIAGNOSIS — N250 Renal osteodystrophy: Secondary | ICD-10-CM | POA: Diagnosis not present

## 2021-05-27 DIAGNOSIS — D509 Iron deficiency anemia, unspecified: Secondary | ICD-10-CM | POA: Diagnosis not present

## 2021-05-27 DIAGNOSIS — D631 Anemia in chronic kidney disease: Secondary | ICD-10-CM | POA: Diagnosis not present

## 2021-05-27 DIAGNOSIS — Z992 Dependence on renal dialysis: Secondary | ICD-10-CM | POA: Diagnosis not present

## 2021-05-27 DIAGNOSIS — N2581 Secondary hyperparathyroidism of renal origin: Secondary | ICD-10-CM | POA: Diagnosis not present

## 2021-05-27 DIAGNOSIS — N250 Renal osteodystrophy: Secondary | ICD-10-CM | POA: Diagnosis not present

## 2021-05-27 DIAGNOSIS — N186 End stage renal disease: Secondary | ICD-10-CM | POA: Diagnosis not present

## 2021-05-28 ENCOUNTER — Inpatient Hospital Stay: Payer: Medicare Other | Admitting: Infectious Disease

## 2021-05-29 DIAGNOSIS — N2581 Secondary hyperparathyroidism of renal origin: Secondary | ICD-10-CM | POA: Diagnosis not present

## 2021-05-29 DIAGNOSIS — N250 Renal osteodystrophy: Secondary | ICD-10-CM | POA: Diagnosis not present

## 2021-05-29 DIAGNOSIS — D509 Iron deficiency anemia, unspecified: Secondary | ICD-10-CM | POA: Diagnosis not present

## 2021-05-29 DIAGNOSIS — N186 End stage renal disease: Secondary | ICD-10-CM | POA: Diagnosis not present

## 2021-05-29 DIAGNOSIS — D631 Anemia in chronic kidney disease: Secondary | ICD-10-CM | POA: Diagnosis not present

## 2021-05-29 DIAGNOSIS — Z992 Dependence on renal dialysis: Secondary | ICD-10-CM | POA: Diagnosis not present

## 2021-06-01 DIAGNOSIS — D631 Anemia in chronic kidney disease: Secondary | ICD-10-CM | POA: Diagnosis not present

## 2021-06-01 DIAGNOSIS — N250 Renal osteodystrophy: Secondary | ICD-10-CM | POA: Diagnosis not present

## 2021-06-01 DIAGNOSIS — Z992 Dependence on renal dialysis: Secondary | ICD-10-CM | POA: Diagnosis not present

## 2021-06-01 DIAGNOSIS — D509 Iron deficiency anemia, unspecified: Secondary | ICD-10-CM | POA: Diagnosis not present

## 2021-06-01 DIAGNOSIS — N2581 Secondary hyperparathyroidism of renal origin: Secondary | ICD-10-CM | POA: Diagnosis not present

## 2021-06-01 DIAGNOSIS — N186 End stage renal disease: Secondary | ICD-10-CM | POA: Diagnosis not present

## 2021-06-02 ENCOUNTER — Inpatient Hospital Stay: Payer: Medicare Other | Admitting: Infectious Disease

## 2021-06-03 DIAGNOSIS — D631 Anemia in chronic kidney disease: Secondary | ICD-10-CM | POA: Diagnosis not present

## 2021-06-03 DIAGNOSIS — N186 End stage renal disease: Secondary | ICD-10-CM | POA: Diagnosis not present

## 2021-06-03 DIAGNOSIS — Z992 Dependence on renal dialysis: Secondary | ICD-10-CM | POA: Diagnosis not present

## 2021-06-03 DIAGNOSIS — N250 Renal osteodystrophy: Secondary | ICD-10-CM | POA: Diagnosis not present

## 2021-06-03 DIAGNOSIS — N2581 Secondary hyperparathyroidism of renal origin: Secondary | ICD-10-CM | POA: Diagnosis not present

## 2021-06-03 DIAGNOSIS — D509 Iron deficiency anemia, unspecified: Secondary | ICD-10-CM | POA: Diagnosis not present

## 2021-06-04 ENCOUNTER — Encounter: Payer: Self-pay | Admitting: Family Medicine

## 2021-06-04 ENCOUNTER — Ambulatory Visit (INDEPENDENT_AMBULATORY_CARE_PROVIDER_SITE_OTHER): Payer: Medicare Other | Admitting: Family Medicine

## 2021-06-04 VITALS — BP 134/72 | HR 79 | Temp 98.1°F | Wt 177.6 lb

## 2021-06-04 DIAGNOSIS — Z992 Dependence on renal dialysis: Secondary | ICD-10-CM | POA: Diagnosis not present

## 2021-06-04 DIAGNOSIS — I350 Nonrheumatic aortic (valve) stenosis: Secondary | ICD-10-CM | POA: Diagnosis not present

## 2021-06-04 DIAGNOSIS — R059 Cough, unspecified: Secondary | ICD-10-CM | POA: Insufficient documentation

## 2021-06-04 DIAGNOSIS — R051 Acute cough: Secondary | ICD-10-CM | POA: Diagnosis not present

## 2021-06-04 DIAGNOSIS — F3341 Major depressive disorder, recurrent, in partial remission: Secondary | ICD-10-CM

## 2021-06-04 DIAGNOSIS — E118 Type 2 diabetes mellitus with unspecified complications: Secondary | ICD-10-CM

## 2021-06-04 DIAGNOSIS — D509 Iron deficiency anemia, unspecified: Secondary | ICD-10-CM | POA: Diagnosis not present

## 2021-06-04 DIAGNOSIS — I10 Essential (primary) hypertension: Secondary | ICD-10-CM

## 2021-06-04 DIAGNOSIS — E785 Hyperlipidemia, unspecified: Secondary | ICD-10-CM | POA: Diagnosis not present

## 2021-06-04 DIAGNOSIS — D631 Anemia in chronic kidney disease: Secondary | ICD-10-CM | POA: Diagnosis not present

## 2021-06-04 DIAGNOSIS — N2581 Secondary hyperparathyroidism of renal origin: Secondary | ICD-10-CM | POA: Diagnosis not present

## 2021-06-04 DIAGNOSIS — N250 Renal osteodystrophy: Secondary | ICD-10-CM | POA: Diagnosis not present

## 2021-06-04 DIAGNOSIS — N186 End stage renal disease: Secondary | ICD-10-CM | POA: Diagnosis not present

## 2021-06-04 MED ORDER — INSULIN NPH (HUMAN) (ISOPHANE) 100 UNIT/ML ~~LOC~~ SUSP: 10.0000 [IU] | Freq: Every day | SUBCUTANEOUS | 5 refills | Status: AC

## 2021-06-04 MED ORDER — PROMETHAZINE-DM 6.25-15 MG/5ML PO SYRP
5.0000 mL | ORAL_SOLUTION | Freq: Four times a day (QID) | ORAL | 0 refills | Status: DC | PRN
Start: 2021-06-04 — End: 2021-07-23

## 2021-06-04 MED ORDER — ATORVASTATIN CALCIUM 20 MG PO TABS: 20.0000 mg | ORAL_TABLET | Freq: Every day | ORAL | 1 refills | Status: AC

## 2021-06-04 MED ORDER — LORATADINE 10 MG PO TABS: 10.0000 mg | ORAL_TABLET | Freq: Every day | ORAL | 1 refills | Status: AC

## 2021-06-04 MED ORDER — CYCLOBENZAPRINE HCL 5 MG PO TABS: 5.0000 mg | ORAL_TABLET | Freq: Two times a day (BID) | ORAL | 1 refills | Status: AC | PRN

## 2021-06-04 MED ORDER — CITALOPRAM HYDROBROMIDE 40 MG PO TABS: 40.0000 mg | ORAL_TABLET | Freq: Every day | ORAL | 1 refills | Status: AC

## 2021-06-04 NOTE — Assessment & Plan Note (Signed)
Stable.  Continue Lasix.

## 2021-06-04 NOTE — Assessment & Plan Note (Signed)
A1c at goal.  No hypoglycemia.  Continue current dosing of NPH.

## 2021-06-04 NOTE — Progress Notes (Signed)
Subjective:  Patient ID: Paul Gonzalez, male    DOB: May 24, 1965  Age: 56 y.o. MRN: [id number]  CC: Chief Complaint  Patient presents with   Diabetes    Checking sugars at night; lab work from dialysis. Pt has had cough for about a week    HPI:  56 year old male with hypertension, type 2 diabetes with complications, ESRD on HD, hyperlipidemia presents for follow-up.  Hypertension is well controlled on Lasix and with HD.  Lipids have been stable.  He would like a lipid panel done today.  He is on atorvastatin and tolerating well.  Patient has blood work from [facility].  Most recent A1c 5.7.  Denies hypoglycemia.  He is doing well on NPH.  Patient reports ongoing cough over the past week.  Seems to be worse at night.  No fevers.  No other associated symptoms.  He would like something to help treat the cough.  Patient Active Problem List   Diagnosis Date Noted   Aortic stenosis 06/04/2021   Cough 06/04/2021   ESRD (end stage renal disease) on dialysis ([place]) 12/04/2020   Diabetic retinopathy/legally blind 12/04/2020   Depression 01/21/2014   Anemia in CKD (chronic kidney disease) 12/11/2013   Diabetic neuropathy, type II diabetes mellitus ([place]) 04/10/2013   HTN (hypertension), benign 01/05/2013   Type 2 diabetes mellitus with complications ([place]) 35/36/1443   Hyperlipidemia 01/05/2013    Social Hx   Social History   Socioeconomic History   Marital status: Significant Other    Spouse name: [name]   Number of children: 0   Years of education: Not on file   Highest education level: Not on file  Occupational History   Not on file  Tobacco Use   Smoking status: Never   Smokeless tobacco: Never  Substance and Sexual Activity   Alcohol use: Yes    Comment: rarely   Drug use: No   Sexual activity: Not on file  Other Topics Concern   Not on file  Social History Narrative   ** Merged History Encounter **    Gregary Signs and Ronalee Belts have been together x 25 years in 2022.    Social Determinants of Health   Financial Resource Strain: Medium Risk   Difficulty of Paying Living Expenses: Somewhat hard  Food Insecurity: No Food Insecurity   Worried About Charity fundraiser in the Last Year: Never true   Ran Out of Food in the Last Year: Never true  Transportation Needs: No Transportation Needs   Lack of Transportation (Medical): No   Lack of Transportation (Non-Medical): No  Physical Activity: Sufficiently Active   Days of Exercise per Week: 5 days   Minutes of Exercise per Session: 60 min  Stress: Stress Concern Present   Feeling of Stress : To some extent  Social Connections: Moderately Isolated   Frequency of Communication with Friends and Family: More than three times a week   Frequency of Social Gatherings with Friends and Family: More than three times a week   Attends Religious Services: Never   Marine scientist or Organizations: No   Attends Archivist Meetings: Never   Marital Status: Married    Review of Systems  Constitutional:  Negative for fever.  Respiratory:  Positive for cough.    Objective:  BP 134/72   Pulse 79   Temp 98.1 F (36.7 C)   Wt 177 lb 9.6 oz (80.6 kg)   SpO2 98%   BMI 27.00 kg/m  06/04/2021   10:00 AM 03/31/2021    2:27 PM 03/19/2021   12:00 PM  BP/Weight  Systolic BP 409 735 329  Diastolic BP 72 57 71  Wt. (Lbs) 177.6 175   BMI 27 kg/m2 26.61 kg/m2     Physical Exam Vitals and nursing note reviewed.  Constitutional:      General: He is not in acute distress.    Appearance: Normal appearance.  Eyes:     General:        Right eye: No discharge.        Left eye: No discharge.     Conjunctiva/sclera: Conjunctivae normal.  Cardiovascular:     Rate and Rhythm: Normal rate and regular rhythm.     Comments: 3/6 systolic murmur.  Pulmonary:     Effort: Pulmonary effort is normal.     Breath sounds: Normal breath sounds. No wheezing, rhonchi or rales.  Neurological:     Mental Status:  He is alert.  Psychiatric:        Mood and Affect: Mood normal.        Behavior: Behavior normal.    Lab Results  Component Value Date   WBC 8.5 03/18/2021   HGB 8.7 (L) 03/18/2021   HCT 25.8 (L) 03/18/2021   PLT 92 (L) 03/18/2021   GLUCOSE 176 (H) 03/19/2021   CHOL 132 12/04/2020   TRIG 182 (H) 12/04/2020   HDL 36 (L) 12/04/2020   LDLCALC 65 12/04/2020   ALT 81 (H) 03/17/2021   AST 70 (H) 03/17/2021   NA 133 (L) 03/19/2021   K 3.6 03/19/2021   CL 95 (L) 03/19/2021   CREATININE 5.33 (H) 03/19/2021   BUN 50 (H) 03/19/2021   CO2 25 03/19/2021   INR 1.16 03/02/2013   HGBA1C 6.6 (H) 03/16/2021     Assessment & Plan:   Problem List Items Addressed This Visit       Cardiovascular and Mediastinum   HTN (hypertension), benign    Stable.  Continue Lasix.       Relevant Medications   atorvastatin (LIPITOR) 20 MG tablet   Aortic stenosis   Relevant Medications   atorvastatin (LIPITOR) 20 MG tablet     Endocrine   Type 2 diabetes mellitus with complications (HCC)    J2E at goal.  No hypoglycemia.  Continue current dosing of NPH.       Relevant Medications   atorvastatin (LIPITOR) 20 MG tablet   insulin NPH Human (NOVOLIN N) 100 UNIT/ML injection     Other   Hyperlipidemia - Primary    Lipid panel today.  Continue Lipitor.       Relevant Medications   atorvastatin (LIPITOR) 20 MG tablet   Other Relevant Orders   Lipid Profile   Depression   Relevant Medications   citalopram (CELEXA) 40 MG tablet   Cough    Lungs clear.  No systemic symptoms.  Promethazine DM for cough.        Meds ordered this encounter  Medications   atorvastatin (LIPITOR) 20 MG tablet    Sig: Take 1 tablet (20 mg total) by mouth daily.    Dispense:  90 tablet    Refill:  1   citalopram (CELEXA) 40 MG tablet    Sig: Take 1 tablet (40 mg total) by mouth daily.    Dispense:  90 tablet    Refill:  1   loratadine (CLARITIN) 10 MG tablet    Sig: Take 1 tablet (10 mg total) by  mouth daily.    Dispense:  90 tablet    Refill:  1   insulin NPH Human (NOVOLIN N) 100 UNIT/ML injection    Sig: Inject 0.1-0.25 mLs (10-25 Units total) into the skin at bedtime.    Dispense:  10 mL    Refill:  5   cyclobenzaprine (FLEXERIL) 5 MG tablet    Sig: Take 1 tablet (5 mg total) by mouth 2 (two) times daily as needed for muscle spasms.    Dispense:  180 tablet    Refill:  1   promethazine-dextromethorphan (PROMETHAZINE-DM) 6.25-15 MG/5ML syrup    Sig: Take 5 mLs by mouth 4 (four) times daily as needed.    Dispense:  118 mL    Refill:  0    Follow-up:  Return in about 6 months (around 12/04/2021).  [place]

## 2021-06-04 NOTE — Patient Instructions (Signed)
Lab today.  Continue your medications.  Follow up in 6 months.  Take care  Dr. Jacobb Alen  

## 2021-06-04 NOTE — Assessment & Plan Note (Signed)
Lungs clear.  No systemic symptoms.  Promethazine DM for cough.

## 2021-06-04 NOTE — Assessment & Plan Note (Signed)
Lipid panel today. Continue Lipitor. 

## 2021-06-05 DIAGNOSIS — Z992 Dependence on renal dialysis: Secondary | ICD-10-CM | POA: Diagnosis not present

## 2021-06-05 DIAGNOSIS — N186 End stage renal disease: Secondary | ICD-10-CM | POA: Diagnosis not present

## 2021-06-05 DIAGNOSIS — D631 Anemia in chronic kidney disease: Secondary | ICD-10-CM | POA: Diagnosis not present

## 2021-06-05 DIAGNOSIS — N250 Renal osteodystrophy: Secondary | ICD-10-CM | POA: Diagnosis not present

## 2021-06-05 DIAGNOSIS — D509 Iron deficiency anemia, unspecified: Secondary | ICD-10-CM | POA: Diagnosis not present

## 2021-06-05 DIAGNOSIS — N2581 Secondary hyperparathyroidism of renal origin: Secondary | ICD-10-CM | POA: Diagnosis not present

## 2021-06-05 LAB — LIPID PANEL
Chol/HDL Ratio: 2 ratio (ref 0.0–5.0)
Cholesterol, Total: 86 mg/dL — ABNORMAL LOW (ref 100–199)
HDL: 42 mg/dL (ref >39)
LDL Chol Calc (NIH): 30 mg/dL (ref 0–99)
Triglycerides: 60 mg/dL (ref 0–149)
VLDL Cholesterol Cal: 14 mg/dL (ref 5–40)

## 2021-06-08 DIAGNOSIS — N186 End stage renal disease: Secondary | ICD-10-CM | POA: Diagnosis not present

## 2021-06-08 DIAGNOSIS — N250 Renal osteodystrophy: Secondary | ICD-10-CM | POA: Diagnosis not present

## 2021-06-08 DIAGNOSIS — D631 Anemia in chronic kidney disease: Secondary | ICD-10-CM | POA: Diagnosis not present

## 2021-06-08 DIAGNOSIS — Z992 Dependence on renal dialysis: Secondary | ICD-10-CM | POA: Diagnosis not present

## 2021-06-08 DIAGNOSIS — N2581 Secondary hyperparathyroidism of renal origin: Secondary | ICD-10-CM | POA: Diagnosis not present

## 2021-06-08 DIAGNOSIS — D509 Iron deficiency anemia, unspecified: Secondary | ICD-10-CM | POA: Diagnosis not present

## 2021-06-10 DIAGNOSIS — N2581 Secondary hyperparathyroidism of renal origin: Secondary | ICD-10-CM | POA: Diagnosis not present

## 2021-06-10 DIAGNOSIS — D631 Anemia in chronic kidney disease: Secondary | ICD-10-CM | POA: Diagnosis not present

## 2021-06-10 DIAGNOSIS — Z992 Dependence on renal dialysis: Secondary | ICD-10-CM | POA: Diagnosis not present

## 2021-06-10 DIAGNOSIS — N186 End stage renal disease: Secondary | ICD-10-CM | POA: Diagnosis not present

## 2021-06-10 DIAGNOSIS — N250 Renal osteodystrophy: Secondary | ICD-10-CM | POA: Diagnosis not present

## 2021-06-10 DIAGNOSIS — D509 Iron deficiency anemia, unspecified: Secondary | ICD-10-CM | POA: Diagnosis not present

## 2021-06-12 DIAGNOSIS — D509 Iron deficiency anemia, unspecified: Secondary | ICD-10-CM | POA: Diagnosis not present

## 2021-06-12 DIAGNOSIS — D631 Anemia in chronic kidney disease: Secondary | ICD-10-CM | POA: Diagnosis not present

## 2021-06-12 DIAGNOSIS — Z992 Dependence on renal dialysis: Secondary | ICD-10-CM | POA: Diagnosis not present

## 2021-06-12 DIAGNOSIS — N186 End stage renal disease: Secondary | ICD-10-CM | POA: Diagnosis not present

## 2021-06-12 DIAGNOSIS — N2581 Secondary hyperparathyroidism of renal origin: Secondary | ICD-10-CM | POA: Diagnosis not present

## 2021-06-12 DIAGNOSIS — N250 Renal osteodystrophy: Secondary | ICD-10-CM | POA: Diagnosis not present

## 2021-06-15 DIAGNOSIS — N186 End stage renal disease: Secondary | ICD-10-CM | POA: Diagnosis not present

## 2021-06-15 DIAGNOSIS — N2581 Secondary hyperparathyroidism of renal origin: Secondary | ICD-10-CM | POA: Diagnosis not present

## 2021-06-15 DIAGNOSIS — N250 Renal osteodystrophy: Secondary | ICD-10-CM | POA: Diagnosis not present

## 2021-06-15 DIAGNOSIS — D631 Anemia in chronic kidney disease: Secondary | ICD-10-CM | POA: Diagnosis not present

## 2021-06-15 DIAGNOSIS — Z992 Dependence on renal dialysis: Secondary | ICD-10-CM | POA: Diagnosis not present

## 2021-06-15 DIAGNOSIS — D509 Iron deficiency anemia, unspecified: Secondary | ICD-10-CM | POA: Diagnosis not present

## 2021-06-17 DIAGNOSIS — N186 End stage renal disease: Secondary | ICD-10-CM | POA: Diagnosis not present

## 2021-06-17 DIAGNOSIS — N2581 Secondary hyperparathyroidism of renal origin: Secondary | ICD-10-CM | POA: Diagnosis not present

## 2021-06-17 DIAGNOSIS — Z992 Dependence on renal dialysis: Secondary | ICD-10-CM | POA: Diagnosis not present

## 2021-06-17 DIAGNOSIS — D631 Anemia in chronic kidney disease: Secondary | ICD-10-CM | POA: Diagnosis not present

## 2021-06-17 DIAGNOSIS — D509 Iron deficiency anemia, unspecified: Secondary | ICD-10-CM | POA: Diagnosis not present

## 2021-06-17 DIAGNOSIS — N250 Renal osteodystrophy: Secondary | ICD-10-CM | POA: Diagnosis not present

## 2021-06-19 DIAGNOSIS — D509 Iron deficiency anemia, unspecified: Secondary | ICD-10-CM | POA: Diagnosis not present

## 2021-06-19 DIAGNOSIS — N2581 Secondary hyperparathyroidism of renal origin: Secondary | ICD-10-CM | POA: Diagnosis not present

## 2021-06-19 DIAGNOSIS — Z992 Dependence on renal dialysis: Secondary | ICD-10-CM | POA: Diagnosis not present

## 2021-06-19 DIAGNOSIS — N250 Renal osteodystrophy: Secondary | ICD-10-CM | POA: Diagnosis not present

## 2021-06-19 DIAGNOSIS — D631 Anemia in chronic kidney disease: Secondary | ICD-10-CM | POA: Diagnosis not present

## 2021-06-19 DIAGNOSIS — N186 End stage renal disease: Secondary | ICD-10-CM | POA: Diagnosis not present

## 2021-06-22 DIAGNOSIS — D509 Iron deficiency anemia, unspecified: Secondary | ICD-10-CM | POA: Diagnosis not present

## 2021-06-22 DIAGNOSIS — Z992 Dependence on renal dialysis: Secondary | ICD-10-CM | POA: Diagnosis not present

## 2021-06-22 DIAGNOSIS — N2581 Secondary hyperparathyroidism of renal origin: Secondary | ICD-10-CM | POA: Diagnosis not present

## 2021-06-22 DIAGNOSIS — N186 End stage renal disease: Secondary | ICD-10-CM | POA: Diagnosis not present

## 2021-06-22 DIAGNOSIS — N250 Renal osteodystrophy: Secondary | ICD-10-CM | POA: Diagnosis not present

## 2021-06-22 DIAGNOSIS — D631 Anemia in chronic kidney disease: Secondary | ICD-10-CM | POA: Diagnosis not present

## 2021-06-24 DIAGNOSIS — N250 Renal osteodystrophy: Secondary | ICD-10-CM | POA: Diagnosis not present

## 2021-06-24 DIAGNOSIS — Z992 Dependence on renal dialysis: Secondary | ICD-10-CM | POA: Diagnosis not present

## 2021-06-24 DIAGNOSIS — D631 Anemia in chronic kidney disease: Secondary | ICD-10-CM | POA: Diagnosis not present

## 2021-06-24 DIAGNOSIS — D509 Iron deficiency anemia, unspecified: Secondary | ICD-10-CM | POA: Diagnosis not present

## 2021-06-24 DIAGNOSIS — N2581 Secondary hyperparathyroidism of renal origin: Secondary | ICD-10-CM | POA: Diagnosis not present

## 2021-06-24 DIAGNOSIS — N186 End stage renal disease: Secondary | ICD-10-CM | POA: Diagnosis not present

## 2021-06-26 DIAGNOSIS — N250 Renal osteodystrophy: Secondary | ICD-10-CM | POA: Diagnosis not present

## 2021-06-26 DIAGNOSIS — N186 End stage renal disease: Secondary | ICD-10-CM | POA: Diagnosis not present

## 2021-06-26 DIAGNOSIS — D509 Iron deficiency anemia, unspecified: Secondary | ICD-10-CM | POA: Diagnosis not present

## 2021-06-26 DIAGNOSIS — N2581 Secondary hyperparathyroidism of renal origin: Secondary | ICD-10-CM | POA: Diagnosis not present

## 2021-06-26 DIAGNOSIS — D631 Anemia in chronic kidney disease: Secondary | ICD-10-CM | POA: Diagnosis not present

## 2021-06-26 DIAGNOSIS — Z992 Dependence on renal dialysis: Secondary | ICD-10-CM | POA: Diagnosis not present

## 2021-06-29 DIAGNOSIS — N186 End stage renal disease: Secondary | ICD-10-CM | POA: Diagnosis not present

## 2021-06-29 DIAGNOSIS — N2581 Secondary hyperparathyroidism of renal origin: Secondary | ICD-10-CM | POA: Diagnosis not present

## 2021-06-29 DIAGNOSIS — D509 Iron deficiency anemia, unspecified: Secondary | ICD-10-CM | POA: Diagnosis not present

## 2021-06-29 DIAGNOSIS — Z992 Dependence on renal dialysis: Secondary | ICD-10-CM | POA: Diagnosis not present

## 2021-06-29 DIAGNOSIS — D631 Anemia in chronic kidney disease: Secondary | ICD-10-CM | POA: Diagnosis not present

## 2021-06-29 DIAGNOSIS — N250 Renal osteodystrophy: Secondary | ICD-10-CM | POA: Diagnosis not present

## 2021-07-01 DIAGNOSIS — D509 Iron deficiency anemia, unspecified: Secondary | ICD-10-CM | POA: Diagnosis not present

## 2021-07-01 DIAGNOSIS — N250 Renal osteodystrophy: Secondary | ICD-10-CM | POA: Diagnosis not present

## 2021-07-01 DIAGNOSIS — Z992 Dependence on renal dialysis: Secondary | ICD-10-CM | POA: Diagnosis not present

## 2021-07-01 DIAGNOSIS — N186 End stage renal disease: Secondary | ICD-10-CM | POA: Diagnosis not present

## 2021-07-01 DIAGNOSIS — D631 Anemia in chronic kidney disease: Secondary | ICD-10-CM | POA: Diagnosis not present

## 2021-07-01 DIAGNOSIS — N2581 Secondary hyperparathyroidism of renal origin: Secondary | ICD-10-CM | POA: Diagnosis not present

## 2021-07-02 ENCOUNTER — Other Ambulatory Visit: Payer: Self-pay

## 2021-07-02 ENCOUNTER — Ambulatory Visit
Admission: RE | Admit: 2021-07-02 | Discharge: 2021-07-02 | Disposition: A | Discharge status: Home / Self Care | Payer: Medicare Other | Source: Ambulatory Visit | Attending: Infectious Disease | Admitting: Infectious Disease

## 2021-07-02 ENCOUNTER — Ambulatory Visit (INDEPENDENT_AMBULATORY_CARE_PROVIDER_SITE_OTHER): Payer: Medicare Other | Admitting: Infectious Disease

## 2021-07-02 ENCOUNTER — Encounter: Payer: Self-pay | Admitting: Infectious Disease

## 2021-07-02 VITALS — BP 167/75 | HR 83 | Temp 97.4°F | Wt 178.0 lb

## 2021-07-02 DIAGNOSIS — B9561 Methicillin susceptible Staphylococcus aureus infection as the cause of diseases classified elsewhere: Secondary | ICD-10-CM

## 2021-07-02 DIAGNOSIS — K223 Perforation of esophagus: Secondary | ICD-10-CM

## 2021-07-02 DIAGNOSIS — N186 End stage renal disease: Secondary | ICD-10-CM

## 2021-07-02 DIAGNOSIS — R7881 Bacteremia: Secondary | ICD-10-CM

## 2021-07-02 DIAGNOSIS — R052 Subacute cough: Secondary | ICD-10-CM

## 2021-07-02 DIAGNOSIS — Z992 Dependence on renal dialysis: Secondary | ICD-10-CM | POA: Diagnosis not present

## 2021-07-02 DIAGNOSIS — R059 Cough, unspecified: Secondary | ICD-10-CM | POA: Diagnosis not present

## 2021-07-02 DIAGNOSIS — R0602 Shortness of breath: Secondary | ICD-10-CM | POA: Diagnosis not present

## 2021-07-02 DIAGNOSIS — I120 Hypertensive chronic kidney disease with stage 5 chronic kidney disease or end stage renal disease: Secondary | ICD-10-CM

## 2021-07-02 DIAGNOSIS — I10 Essential (primary) hypertension: Secondary | ICD-10-CM
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
## 2021-07-03 DIAGNOSIS — D509 Iron deficiency anemia, unspecified: Secondary | ICD-10-CM | POA: Diagnosis not present

## 2021-07-03 DIAGNOSIS — D631 Anemia in chronic kidney disease: Secondary | ICD-10-CM | POA: Diagnosis not present

## 2021-07-03 DIAGNOSIS — N250 Renal osteodystrophy: Secondary | ICD-10-CM | POA: Diagnosis not present

## 2021-07-03 DIAGNOSIS — Z992 Dependence on renal dialysis: Secondary | ICD-10-CM | POA: Diagnosis not present

## 2021-07-03 DIAGNOSIS — N186 End stage renal disease: Secondary | ICD-10-CM | POA: Diagnosis not present

## 2021-07-03 DIAGNOSIS — N2581 Secondary hyperparathyroidism of renal origin: Secondary | ICD-10-CM | POA: Diagnosis not present

## 2021-07-04 DIAGNOSIS — Z992 Dependence on renal dialysis: Secondary | ICD-10-CM | POA: Diagnosis not present

## 2021-07-04 DIAGNOSIS — N2581 Secondary hyperparathyroidism of renal origin: Secondary | ICD-10-CM | POA: Diagnosis not present

## 2021-07-04 DIAGNOSIS — N186 End stage renal disease: Secondary | ICD-10-CM | POA: Diagnosis not present

## 2021-07-04 DIAGNOSIS — N250 Renal osteodystrophy: Secondary | ICD-10-CM | POA: Diagnosis not present

## 2021-07-04 DIAGNOSIS — D509 Iron deficiency anemia, unspecified: Secondary | ICD-10-CM | POA: Diagnosis not present

## 2021-07-04 DIAGNOSIS — D631 Anemia in chronic kidney disease: Secondary | ICD-10-CM | POA: Diagnosis not present

## 2021-07-06 DIAGNOSIS — N2581 Secondary hyperparathyroidism of renal origin: Secondary | ICD-10-CM | POA: Diagnosis not present

## 2021-07-06 DIAGNOSIS — N186 End stage renal disease: Secondary | ICD-10-CM | POA: Diagnosis not present

## 2021-07-06 DIAGNOSIS — D509 Iron deficiency anemia, unspecified: Secondary | ICD-10-CM | POA: Diagnosis not present

## 2021-07-06 DIAGNOSIS — D631 Anemia in chronic kidney disease: Secondary | ICD-10-CM | POA: Diagnosis not present

## 2021-07-06 DIAGNOSIS — Z992 Dependence on renal dialysis: Secondary | ICD-10-CM | POA: Diagnosis not present

## 2021-07-06 DIAGNOSIS — N250 Renal osteodystrophy: Secondary | ICD-10-CM | POA: Diagnosis not present

## 2021-07-08 DIAGNOSIS — D631 Anemia in chronic kidney disease: Secondary | ICD-10-CM | POA: Diagnosis not present

## 2021-07-08 DIAGNOSIS — Z992 Dependence on renal dialysis: Secondary | ICD-10-CM | POA: Diagnosis not present

## 2021-07-08 DIAGNOSIS — N186 End stage renal disease: Secondary | ICD-10-CM | POA: Diagnosis not present

## 2021-07-08 DIAGNOSIS — N250 Renal osteodystrophy: Secondary | ICD-10-CM | POA: Diagnosis not present

## 2021-07-08 DIAGNOSIS — D509 Iron deficiency anemia, unspecified: Secondary | ICD-10-CM | POA: Diagnosis not present

## 2021-07-08 DIAGNOSIS — N2581 Secondary hyperparathyroidism of renal origin: Secondary | ICD-10-CM | POA: Diagnosis not present

## 2021-07-08 LAB — CULTURE, BLOOD (SINGLE)
MICRO NUMBER:: 13606315
MICRO NUMBER:: 13606316
Result:: NO GROWTH
Result:: NO GROWTH
SPECIMEN QUALITY:: ADEQUATE
SPECIMEN QUALITY:: ADEQUATE

## 2021-07-10 DIAGNOSIS — Z992 Dependence on renal dialysis: Secondary | ICD-10-CM | POA: Diagnosis not present

## 2021-07-10 DIAGNOSIS — D631 Anemia in chronic kidney disease: Secondary | ICD-10-CM | POA: Diagnosis not present

## 2021-07-10 DIAGNOSIS — D509 Iron deficiency anemia, unspecified: Secondary | ICD-10-CM | POA: Diagnosis not present

## 2021-07-10 DIAGNOSIS — N2581 Secondary hyperparathyroidism of renal origin: Secondary | ICD-10-CM | POA: Diagnosis not present

## 2021-07-10 DIAGNOSIS — N250 Renal osteodystrophy: Secondary | ICD-10-CM | POA: Diagnosis not present

## 2021-07-10 DIAGNOSIS — N186 End stage renal disease: Secondary | ICD-10-CM | POA: Diagnosis not present

## 2021-07-12 DIAGNOSIS — Z992 Dependence on renal dialysis: Secondary | ICD-10-CM | POA: Diagnosis not present

## 2021-07-12 DIAGNOSIS — N2581 Secondary hyperparathyroidism of renal origin: Secondary | ICD-10-CM | POA: Diagnosis not present

## 2021-07-12 DIAGNOSIS — D509 Iron deficiency anemia, unspecified: Secondary | ICD-10-CM | POA: Diagnosis not present

## 2021-07-12 DIAGNOSIS — N250 Renal osteodystrophy: Secondary | ICD-10-CM | POA: Diagnosis not present

## 2021-07-12 DIAGNOSIS — D631 Anemia in chronic kidney disease: Secondary | ICD-10-CM | POA: Diagnosis not present

## 2021-07-12 DIAGNOSIS — N186 End stage renal disease: Secondary | ICD-10-CM | POA: Diagnosis not present

## 2021-07-13 DIAGNOSIS — D631 Anemia in chronic kidney disease: Secondary | ICD-10-CM | POA: Diagnosis not present

## 2021-07-13 DIAGNOSIS — D509 Iron deficiency anemia, unspecified: Secondary | ICD-10-CM | POA: Diagnosis not present

## 2021-07-13 DIAGNOSIS — Z992 Dependence on renal dialysis: Secondary | ICD-10-CM | POA: Diagnosis not present

## 2021-07-13 DIAGNOSIS — N186 End stage renal disease: Secondary | ICD-10-CM | POA: Diagnosis not present

## 2021-07-13 DIAGNOSIS — E119 Type 2 diabetes mellitus without complications: Secondary | ICD-10-CM | POA: Diagnosis not present

## 2021-07-13 DIAGNOSIS — N2581 Secondary hyperparathyroidism of renal origin: Secondary | ICD-10-CM | POA: Diagnosis not present

## 2021-07-13 DIAGNOSIS — N250 Renal osteodystrophy: Secondary | ICD-10-CM | POA: Diagnosis not present

## 2021-07-15 ENCOUNTER — Telehealth: Payer: Self-pay

## 2021-07-15 DIAGNOSIS — Z992 Dependence on renal dialysis: Secondary | ICD-10-CM | POA: Diagnosis not present

## 2021-07-15 DIAGNOSIS — D631 Anemia in chronic kidney disease: Secondary | ICD-10-CM | POA: Diagnosis not present

## 2021-07-15 DIAGNOSIS — N2581 Secondary hyperparathyroidism of renal origin: Secondary | ICD-10-CM | POA: Diagnosis not present

## 2021-07-15 DIAGNOSIS — D509 Iron deficiency anemia, unspecified: Secondary | ICD-10-CM | POA: Diagnosis not present

## 2021-07-15 DIAGNOSIS — N250 Renal osteodystrophy: Secondary | ICD-10-CM | POA: Diagnosis not present

## 2021-07-15 DIAGNOSIS — N186 End stage renal disease: Secondary | ICD-10-CM | POA: Diagnosis not present

## 2021-07-15 NOTE — Telephone Encounter (Signed)
Patient's wife is calling asking for chest x ray results before she schedules with the ENT. Patient was advised that Dr. Tommy Medal will not return until next week, but she insisted on getting a call back before next week about the results Please advise Paul Gonzalez

## 2021-07-16 ENCOUNTER — Encounter: Payer: Self-pay | Admitting: *Deleted

## 2021-07-16 DIAGNOSIS — N186 End stage renal disease: Secondary | ICD-10-CM | POA: Diagnosis not present

## 2021-07-16 DIAGNOSIS — K219 Gastro-esophageal reflux disease without esophagitis: Secondary | ICD-10-CM | POA: Diagnosis not present

## 2021-07-16 DIAGNOSIS — R053 Chronic cough: Secondary | ICD-10-CM | POA: Diagnosis not present

## 2021-07-16 DIAGNOSIS — Z794 Long term (current) use of insulin: Secondary | ICD-10-CM | POA: Diagnosis not present

## 2021-07-16 NOTE — Telephone Encounter (Signed)
My chart message sent to patient per Dr. Gale Journey

## 2021-07-17 DIAGNOSIS — Z992 Dependence on renal dialysis: Secondary | ICD-10-CM | POA: Diagnosis not present

## 2021-07-17 DIAGNOSIS — N250 Renal osteodystrophy: Secondary | ICD-10-CM | POA: Diagnosis not present

## 2021-07-17 DIAGNOSIS — D509 Iron deficiency anemia, unspecified: Secondary | ICD-10-CM | POA: Diagnosis not present

## 2021-07-17 DIAGNOSIS — N2581 Secondary hyperparathyroidism of renal origin: Secondary | ICD-10-CM | POA: Diagnosis not present

## 2021-07-17 DIAGNOSIS — D631 Anemia in chronic kidney disease: Secondary | ICD-10-CM | POA: Diagnosis not present

## 2021-07-17 DIAGNOSIS — N186 End stage renal disease: Secondary | ICD-10-CM | POA: Diagnosis not present

## 2021-07-20 DIAGNOSIS — N2581 Secondary hyperparathyroidism of renal origin: Secondary | ICD-10-CM | POA: Diagnosis not present

## 2021-07-20 DIAGNOSIS — D631 Anemia in chronic kidney disease: Secondary | ICD-10-CM | POA: Diagnosis not present

## 2021-07-20 DIAGNOSIS — Z992 Dependence on renal dialysis: Secondary | ICD-10-CM | POA: Diagnosis not present

## 2021-07-20 DIAGNOSIS — N250 Renal osteodystrophy: Secondary | ICD-10-CM | POA: Diagnosis not present

## 2021-07-20 DIAGNOSIS — D509 Iron deficiency anemia, unspecified: Secondary | ICD-10-CM | POA: Diagnosis not present

## 2021-07-20 DIAGNOSIS — N186 End stage renal disease: Secondary | ICD-10-CM | POA: Diagnosis not present

## 2021-07-22 DIAGNOSIS — N2581 Secondary hyperparathyroidism of renal origin: Secondary | ICD-10-CM | POA: Diagnosis not present

## 2021-07-22 DIAGNOSIS — D509 Iron deficiency anemia, unspecified: Secondary | ICD-10-CM | POA: Diagnosis not present

## 2021-07-22 DIAGNOSIS — Z992 Dependence on renal dialysis: Secondary | ICD-10-CM | POA: Diagnosis not present

## 2021-07-22 DIAGNOSIS — N186 End stage renal disease: Secondary | ICD-10-CM | POA: Diagnosis not present

## 2021-07-22 DIAGNOSIS — N250 Renal osteodystrophy: Secondary | ICD-10-CM | POA: Diagnosis not present

## 2021-07-22 DIAGNOSIS — D631 Anemia in chronic kidney disease: Secondary | ICD-10-CM | POA: Diagnosis not present

## 2021-07-23 ENCOUNTER — Ambulatory Visit (HOSPITAL_COMMUNITY)
Admission: RE | Admit: 2021-07-23 | Discharge: 2021-07-23 | Disposition: A | Discharge status: Home / Self Care | Payer: Medicare Other | Source: Ambulatory Visit | Attending: Nurse Practitioner | Admitting: Nurse Practitioner

## 2021-07-23 ENCOUNTER — Encounter: Payer: Self-pay | Admitting: Nurse Practitioner

## 2021-07-23 ENCOUNTER — Ambulatory Visit (INDEPENDENT_AMBULATORY_CARE_PROVIDER_SITE_OTHER): Payer: Medicare Other | Admitting: Nurse Practitioner

## 2021-07-23 VITALS — BP 122/72 | HR 88 | Temp 97.3°F | Wt 179.6 lb

## 2021-07-23 DIAGNOSIS — R059 Cough, unspecified: Secondary | ICD-10-CM | POA: Diagnosis not present

## 2021-07-23 DIAGNOSIS — R051 Acute cough: Secondary | ICD-10-CM | POA: Insufficient documentation

## 2021-07-23 DIAGNOSIS — K219 Gastro-esophageal reflux disease without esophagitis: Secondary | ICD-10-CM

## 2021-07-23 MED ORDER — LEVOFLOXACIN 250 MG PO TABS: ORAL_TABLET | ORAL | 0 refills | Status: AC

## 2021-07-23 MED ORDER — PANTOPRAZOLE SODIUM 40 MG PO TBEC: 40.0000 mg | DELAYED_RELEASE_TABLET | Freq: Every day | ORAL | 3 refills | Status: AC

## 2021-07-23 NOTE — Progress Notes (Signed)
Subjective:    Patient ID: Paul Gonzalez, male    DOB: 1965/01/31, 56 y.o.   MRN: [id number]  HPI  56 year old male patient with extensive history including end-stage renal disease diabetes and recent hospitalization with MSSA Bacteremia.  Patient being followed by infectious disease and ENT.   Patient presents to clinic today for having  a non-productive cough off and on for a couple of months. Pt went to infectious disease and they did x-ray and also went to ENT.  Patient states that his cough is worse when lying down. Patient states that he has been having to sleep in the recliner for several months.  Patient denies any fevers, body aches, chills, increased swelling, chest pain, nasal congestion, wheezing, chest tightness.  Review of Systems  Respiratory:  Positive for cough.   All other systems reviewed and are negative.      Objective:   Physical Exam Vitals reviewed.  Constitutional:      General: He is not in acute distress.    Appearance: Normal appearance. He is normal weight. He is not ill-appearing, toxic-appearing or diaphoretic.  HENT:     Head: Normocephalic and atraumatic.  Cardiovascular:     Rate and Rhythm: Normal rate and regular rhythm.     Chest Wall: Thrill present.     Pulses: Normal pulses.     Heart sounds: Murmur heard.     Arteriovenous access: Left arteriovenous access is present.    Comments: dialysis shunts noted to upper and lower arm.  Positive thrill to both. Pulmonary:     Effort: Pulmonary effort is normal. No accessory muscle usage, prolonged expiration or respiratory distress.     Breath sounds: Examination of the right-middle field reveals decreased breath sounds. Examination of the left-middle field reveals decreased breath sounds. Examination of the right-lower field reveals decreased breath sounds. Examination of the left-lower field reveals decreased breath sounds. Decreased breath sounds present. No wheezing, rhonchi or rales.   Abdominal:     General: Abdomen is flat. Bowel sounds are normal. There is no distension.     Palpations: Abdomen is soft. There is no mass.     Tenderness: There is no abdominal tenderness. There is no guarding or rebound.     Hernia: No hernia is present.  Musculoskeletal:     Cervical back: Normal range of motion and neck supple. No rigidity or tenderness.     Right lower leg: No edema.     Left lower leg: No edema.     Comments: Grossly intact  Lymphadenopathy:     Cervical: No cervical adenopathy.  Skin:    General: Skin is warm.     Capillary Refill: Capillary refill takes less than 2 seconds.  Neurological:     Mental Status: He is alert.     Comments: Grossly intact  Psychiatric:        Mood and Affect: Mood normal.        Behavior: Behavior normal.        Assessment & Plan:   1. Acute cough -We will repeat chest x-ray. -If x-ray continues to show infiltrates versus atelectasis we will start patient on antibiotics. - DG Chest 2 View, Stat - Rerturn to clinic in 5 days to see Dr. Lacinda Axon.  2. Gastroesophageal reflux disease, unspecified whether esophagitis present -Patient states that ENT recommended increasing Protonix to 20 mg to 40 mg. - pantoprazole (PROTONIX) 40 MG tablet; Take 1 tablet (40 mg total) by mouth daily.  Dispense: 30  tablet; Refill: 3    Note:  This document was prepared using Dragon voice recognition software and may include unintentional dictation errors. Note - This record has been created using [employer].  Chart creation errors have been sought, but may not always  have been located. Such creation errors do not reflect on  the standard of medical care.

## 2021-07-24 DIAGNOSIS — N186 End stage renal disease: Secondary | ICD-10-CM | POA: Diagnosis not present

## 2021-07-24 DIAGNOSIS — N2581 Secondary hyperparathyroidism of renal origin: Secondary | ICD-10-CM | POA: Diagnosis not present

## 2021-07-24 DIAGNOSIS — Z992 Dependence on renal dialysis: Secondary | ICD-10-CM | POA: Diagnosis not present

## 2021-07-24 DIAGNOSIS — D509 Iron deficiency anemia, unspecified: Secondary | ICD-10-CM | POA: Diagnosis not present

## 2021-07-24 DIAGNOSIS — N250 Renal osteodystrophy: Secondary | ICD-10-CM | POA: Diagnosis not present

## 2021-07-24 DIAGNOSIS — D631 Anemia in chronic kidney disease: Secondary | ICD-10-CM | POA: Diagnosis not present

## 2021-07-27 DIAGNOSIS — D631 Anemia in chronic kidney disease: Secondary | ICD-10-CM | POA: Diagnosis not present

## 2021-07-27 DIAGNOSIS — N186 End stage renal disease: Secondary | ICD-10-CM | POA: Diagnosis not present

## 2021-07-27 DIAGNOSIS — Z992 Dependence on renal dialysis: Secondary | ICD-10-CM | POA: Diagnosis not present

## 2021-07-27 DIAGNOSIS — N250 Renal osteodystrophy: Secondary | ICD-10-CM | POA: Diagnosis not present

## 2021-07-27 DIAGNOSIS — D509 Iron deficiency anemia, unspecified: Secondary | ICD-10-CM | POA: Diagnosis not present

## 2021-07-27 DIAGNOSIS — N2581 Secondary hyperparathyroidism of renal origin: Secondary | ICD-10-CM | POA: Diagnosis not present

## 2021-07-29 DIAGNOSIS — D509 Iron deficiency anemia, unspecified: Secondary | ICD-10-CM | POA: Diagnosis not present

## 2021-07-29 DIAGNOSIS — Z992 Dependence on renal dialysis: Secondary | ICD-10-CM | POA: Diagnosis not present

## 2021-07-29 DIAGNOSIS — N250 Renal osteodystrophy: Secondary | ICD-10-CM | POA: Diagnosis not present

## 2021-07-29 DIAGNOSIS — D631 Anemia in chronic kidney disease: Secondary | ICD-10-CM | POA: Diagnosis not present

## 2021-07-29 DIAGNOSIS — N2581 Secondary hyperparathyroidism of renal origin: Secondary | ICD-10-CM | POA: Diagnosis not present

## 2021-07-29 DIAGNOSIS — N186 End stage renal disease: Secondary | ICD-10-CM | POA: Diagnosis not present

## 2021-07-30 ENCOUNTER — Encounter: Payer: Self-pay | Admitting: Family Medicine

## 2021-07-30 ENCOUNTER — Ambulatory Visit (INDEPENDENT_AMBULATORY_CARE_PROVIDER_SITE_OTHER): Payer: Medicare Other | Admitting: Family Medicine

## 2021-07-30 DIAGNOSIS — R059 Cough, unspecified: Secondary | ICD-10-CM

## 2021-07-30 NOTE — Patient Instructions (Signed)
I am going to talk with the specialists.  If continues to persist or worsens, please let me know  Take care  Dr. Lacinda Axon

## 2021-07-31 DIAGNOSIS — D509 Iron deficiency anemia, unspecified: Secondary | ICD-10-CM | POA: Diagnosis not present

## 2021-07-31 DIAGNOSIS — N250 Renal osteodystrophy: Secondary | ICD-10-CM | POA: Diagnosis not present

## 2021-07-31 DIAGNOSIS — D631 Anemia in chronic kidney disease: Secondary | ICD-10-CM | POA: Diagnosis not present

## 2021-07-31 DIAGNOSIS — N186 End stage renal disease: Secondary | ICD-10-CM | POA: Diagnosis not present

## 2021-07-31 DIAGNOSIS — N2581 Secondary hyperparathyroidism of renal origin: Secondary | ICD-10-CM | POA: Diagnosis not present

## 2021-07-31 DIAGNOSIS — Z992 Dependence on renal dialysis: Secondary | ICD-10-CM | POA: Diagnosis not present

## 2021-07-31 NOTE — Assessment & Plan Note (Signed)
Persistent. Spoke with Pulmonology Dr. Elsworth Soho regarding his chest xray.  He is concerned that aortic stenosis is contributing/causing. Will discuss with cardiology.

## 2021-07-31 NOTE — Progress Notes (Signed)
Subjective:  Patient ID: Paul Gonzalez, male    DOB: 11/22/1965  Age: 56 y.o. MRN: [id number]  CC: Chief Complaint  Patient presents with   Cough    Follow up on cough. Wife states she feels it is better. Non productive.     HPI:  56 year old male with hypertension, type 2 diabetes, end-stage renal disease on hemodialysis, depression, hyperlipidemia, recent admission for MSSA bacteremia now followed by infectious disease presents for follow-up regarding cough.  Most recent chest x-ray revealed Ill-defined opacities within the bilateral lung bases, which may reflect atelectasis and/or pneumonia. Also revealed suspected small bilateral pleural effusions. He was treated with levaquin on 7/20 and has completed the course. He reports he has had a slight improvement in cough but it still persists particularly when he lies down at night.  Had recent ENT visit to determine if there was esophageal cause of his cough. Reported work up was negative.  No fever.  Ongoing fatigue. Does endorse dyspnea as well.   Patient Active Problem List   Diagnosis Date Noted   Aortic stenosis 06/04/2021   Cough 06/04/2021   ESRD (end stage renal disease) on dialysis ([place]) 12/04/2020   Diabetic retinopathy/legally blind 12/04/2020   Depression 01/21/2014   Anemia in CKD (chronic kidney disease) 12/11/2013   Diabetic neuropathy, type II diabetes mellitus ([place]) 04/10/2013   HTN (hypertension), benign 01/05/2013   Type 2 diabetes mellitus with complications ([place]) 09/26/3005   Hyperlipidemia 01/05/2013    Social Hx   Social History   Socioeconomic History   Marital status: Significant Other    Spouse name: [name]   Number of children: 0   Years of education: Not on file   Highest education level: Not on file  Occupational History   Not on file  Tobacco Use   Smoking status: Never   Smokeless tobacco: Never  Substance and Sexual Activity   Alcohol use: Yes    Comment: rarely   Drug use: No    Sexual activity: Not on file  Other Topics Concern   Not on file  Social History Narrative   ** Merged History Encounter **    Gregary Signs and Ronalee Belts have been together x 25 years in 2022.   Social Determinants of Health   Financial Resource Strain: Medium Risk (12/16/2020)   Overall Financial Resource Strain (CARDIA)    Difficulty of Paying Living Expenses: Somewhat hard  Food Insecurity: No Food Insecurity (01/14/2021)   Hunger Vital Sign    Worried About Running Out of Food in the Last Year: Never true    Ran Out of Food in the Last Year: Never true  Transportation Needs: No Transportation Needs (12/16/2020)   PRAPARE - Hydrologist (Medical): No    Lack of Transportation (Non-Medical): No  Physical Activity: Sufficiently Active (12/16/2020)   Exercise Vital Sign    Days of Exercise per Week: 5 days    Minutes of Exercise per Session: 60 min  Stress: Stress Concern Present (12/16/2020)   [place]    Feeling of Stress : To some extent  Social Connections: Moderately Isolated (12/16/2020)   Social Connection and Isolation Panel [NHANES]    Frequency of Communication with Friends and Family: More than three times a week    Frequency of Social Gatherings with Friends and Family: More than three times a week    Attends Religious Services: Never    Retail buyer of [employer]  or Organizations: No    Attends Archivist Meetings: Never    Marital Status: Married    Review of Systems Per HPI  Objective:  BP (!) 157/66   Pulse 91   Temp 98.5 F (36.9 C)   Wt 180 lb 12.8 oz (82 kg)   SpO2 96%   BMI 27.49 kg/m      07/30/2021    2:12 PM 07/30/2021    1:54 PM 07/23/2021   11:04 AM  BP/Weight  Systolic BP 150 569 794  Diastolic BP 66 46 72  Wt. (Lbs)  180.8 179.6  BMI  27.49 kg/m2 27.31 kg/m2    Physical Exam Vitals and nursing note reviewed.  Constitutional:      General:  He is not in acute distress.    Appearance: Normal appearance.  HENT:     Head: Normocephalic and atraumatic.  Cardiovascular:     Rate and Rhythm: Normal rate and regular rhythm.  Pulmonary:     Effort: Pulmonary effort is normal.     Breath sounds: Normal breath sounds. No wheezing or rales.  Neurological:     Mental Status: He is alert.  Psychiatric:     Comments: Depressed mood.    Lab Results  Component Value Date   WBC 8.5 03/18/2021   HGB 8.7 (L) 03/18/2021   HCT 25.8 (L) 03/18/2021   PLT 92 (L) 03/18/2021   GLUCOSE 176 (H) 03/19/2021   CHOL 86 (L) 06/04/2021   TRIG 60 06/04/2021   HDL 42 06/04/2021   LDLCALC 30 06/04/2021   ALT 81 (H) 03/17/2021   AST 70 (H) 03/17/2021   NA 133 (L) 03/19/2021   K 3.6 03/19/2021   CL 95 (L) 03/19/2021   CREATININE 5.33 (H) 03/19/2021   BUN 50 (H) 03/19/2021   CO2 25 03/19/2021   INR 1.16 03/02/2013   HGBA1C 6.6 (H) 03/16/2021     Assessment & Plan:   Problem List Items Addressed This Visit       Other   Cough    Persistent. Spoke with Pulmonology Dr. Elsworth Soho regarding his chest xray.  He is concerned that aortic stenosis is contributing/causing. Will discuss with cardiology.       Follow-up:  Return in about 3 months (around 10/30/2021).  [place]

## 2021-08-03 DIAGNOSIS — D631 Anemia in chronic kidney disease: Secondary | ICD-10-CM | POA: Diagnosis not present

## 2021-08-03 DIAGNOSIS — D509 Iron deficiency anemia, unspecified: Secondary | ICD-10-CM | POA: Diagnosis not present

## 2021-08-03 DIAGNOSIS — N250 Renal osteodystrophy: Secondary | ICD-10-CM | POA: Diagnosis not present

## 2021-08-03 DIAGNOSIS — N186 End stage renal disease: Secondary | ICD-10-CM | POA: Diagnosis not present

## 2021-08-03 DIAGNOSIS — Z992 Dependence on renal dialysis: Secondary | ICD-10-CM | POA: Diagnosis not present

## 2021-08-03 DIAGNOSIS — N2581 Secondary hyperparathyroidism of renal origin: Secondary | ICD-10-CM | POA: Diagnosis not present

## 2021-08-04 DIAGNOSIS — D631 Anemia in chronic kidney disease: Secondary | ICD-10-CM | POA: Diagnosis not present

## 2021-08-04 DIAGNOSIS — N2581 Secondary hyperparathyroidism of renal origin: Secondary | ICD-10-CM | POA: Diagnosis not present

## 2021-08-04 DIAGNOSIS — N250 Renal osteodystrophy: Secondary | ICD-10-CM | POA: Diagnosis not present

## 2021-08-04 DIAGNOSIS — N186 End stage renal disease: Secondary | ICD-10-CM | POA: Diagnosis not present

## 2021-08-04 DIAGNOSIS — D509 Iron deficiency anemia, unspecified: Secondary | ICD-10-CM | POA: Diagnosis not present

## 2021-08-04 DIAGNOSIS — Z992 Dependence on renal dialysis: Secondary | ICD-10-CM | POA: Diagnosis not present

## 2021-08-05 DIAGNOSIS — D631 Anemia in chronic kidney disease: Secondary | ICD-10-CM | POA: Diagnosis not present

## 2021-08-05 DIAGNOSIS — N250 Renal osteodystrophy: Secondary | ICD-10-CM | POA: Diagnosis not present

## 2021-08-05 DIAGNOSIS — Z992 Dependence on renal dialysis: Secondary | ICD-10-CM | POA: Diagnosis not present

## 2021-08-05 DIAGNOSIS — N186 End stage renal disease: Secondary | ICD-10-CM | POA: Diagnosis not present

## 2021-08-05 DIAGNOSIS — N2581 Secondary hyperparathyroidism of renal origin: Secondary | ICD-10-CM | POA: Diagnosis not present

## 2021-08-05 DIAGNOSIS — D509 Iron deficiency anemia, unspecified: Secondary | ICD-10-CM | POA: Diagnosis not present

## 2021-08-07 DIAGNOSIS — N250 Renal osteodystrophy: Secondary | ICD-10-CM | POA: Diagnosis not present

## 2021-08-07 DIAGNOSIS — N2581 Secondary hyperparathyroidism of renal origin: Secondary | ICD-10-CM | POA: Diagnosis not present

## 2021-08-07 DIAGNOSIS — Z992 Dependence on renal dialysis: Secondary | ICD-10-CM | POA: Diagnosis not present

## 2021-08-07 DIAGNOSIS — D509 Iron deficiency anemia, unspecified: Secondary | ICD-10-CM | POA: Diagnosis not present

## 2021-08-07 DIAGNOSIS — D631 Anemia in chronic kidney disease: Secondary | ICD-10-CM | POA: Diagnosis not present

## 2021-08-07 DIAGNOSIS — N186 End stage renal disease: Secondary | ICD-10-CM | POA: Diagnosis not present

## 2021-08-10 DIAGNOSIS — D631 Anemia in chronic kidney disease: Secondary | ICD-10-CM | POA: Diagnosis not present

## 2021-08-10 DIAGNOSIS — N250 Renal osteodystrophy: Secondary | ICD-10-CM | POA: Diagnosis not present

## 2021-08-10 DIAGNOSIS — D509 Iron deficiency anemia, unspecified: Secondary | ICD-10-CM | POA: Diagnosis not present

## 2021-08-10 DIAGNOSIS — N2581 Secondary hyperparathyroidism of renal origin: Secondary | ICD-10-CM | POA: Diagnosis not present

## 2021-08-10 DIAGNOSIS — Z992 Dependence on renal dialysis: Secondary | ICD-10-CM | POA: Diagnosis not present

## 2021-08-10 DIAGNOSIS — N186 End stage renal disease: Secondary | ICD-10-CM | POA: Diagnosis not present

## 2021-08-11 DIAGNOSIS — D631 Anemia in chronic kidney disease: Secondary | ICD-10-CM | POA: Diagnosis not present

## 2021-08-11 DIAGNOSIS — Z992 Dependence on renal dialysis: Secondary | ICD-10-CM | POA: Diagnosis not present

## 2021-08-11 DIAGNOSIS — N186 End stage renal disease: Secondary | ICD-10-CM | POA: Diagnosis not present

## 2021-08-11 DIAGNOSIS — N250 Renal osteodystrophy: Secondary | ICD-10-CM | POA: Diagnosis not present

## 2021-08-11 DIAGNOSIS — N2581 Secondary hyperparathyroidism of renal origin: Secondary | ICD-10-CM | POA: Diagnosis not present

## 2021-08-11 DIAGNOSIS — D509 Iron deficiency anemia, unspecified: Secondary | ICD-10-CM | POA: Diagnosis not present

## 2021-08-12 DIAGNOSIS — N2581 Secondary hyperparathyroidism of renal origin: Secondary | ICD-10-CM | POA: Diagnosis not present

## 2021-08-12 DIAGNOSIS — N250 Renal osteodystrophy: Secondary | ICD-10-CM | POA: Diagnosis not present

## 2021-08-12 DIAGNOSIS — D509 Iron deficiency anemia, unspecified: Secondary | ICD-10-CM | POA: Diagnosis not present

## 2021-08-12 DIAGNOSIS — N186 End stage renal disease: Secondary | ICD-10-CM | POA: Diagnosis not present

## 2021-08-12 DIAGNOSIS — D631 Anemia in chronic kidney disease: Secondary | ICD-10-CM | POA: Diagnosis not present

## 2021-08-12 DIAGNOSIS — Z992 Dependence on renal dialysis: Secondary | ICD-10-CM | POA: Diagnosis not present

## 2021-08-14 DIAGNOSIS — D509 Iron deficiency anemia, unspecified: Secondary | ICD-10-CM | POA: Diagnosis not present

## 2021-08-14 DIAGNOSIS — N186 End stage renal disease: Secondary | ICD-10-CM | POA: Diagnosis not present

## 2021-08-14 DIAGNOSIS — N2581 Secondary hyperparathyroidism of renal origin: Secondary | ICD-10-CM | POA: Diagnosis not present

## 2021-08-14 DIAGNOSIS — N250 Renal osteodystrophy: Secondary | ICD-10-CM | POA: Diagnosis not present

## 2021-08-14 DIAGNOSIS — Z992 Dependence on renal dialysis: Secondary | ICD-10-CM | POA: Diagnosis not present

## 2021-08-14 DIAGNOSIS — D631 Anemia in chronic kidney disease: Secondary | ICD-10-CM | POA: Diagnosis not present

## 2021-08-17 ENCOUNTER — Telehealth: Payer: Self-pay | Admitting: Family Medicine

## 2021-08-17 DIAGNOSIS — Z992 Dependence on renal dialysis: Secondary | ICD-10-CM | POA: Diagnosis not present

## 2021-08-17 DIAGNOSIS — D631 Anemia in chronic kidney disease: Secondary | ICD-10-CM | POA: Diagnosis not present

## 2021-08-17 DIAGNOSIS — D509 Iron deficiency anemia, unspecified: Secondary | ICD-10-CM | POA: Diagnosis not present

## 2021-08-17 DIAGNOSIS — N186 End stage renal disease: Secondary | ICD-10-CM | POA: Diagnosis not present

## 2021-08-17 DIAGNOSIS — R059 Cough, unspecified: Secondary | ICD-10-CM

## 2021-08-17 DIAGNOSIS — N2581 Secondary hyperparathyroidism of renal origin: Secondary | ICD-10-CM | POA: Diagnosis not present

## 2021-08-17 DIAGNOSIS — N250 Renal osteodystrophy: Secondary | ICD-10-CM | POA: Diagnosis not present

## 2021-08-17 DIAGNOSIS — R0602 Shortness of breath: Secondary | ICD-10-CM

## 2021-08-17 NOTE — Addendum Note (Signed)
Addended by: Vicente Males on: 08/17/2021 01:24 PM   Modules accepted: Orders

## 2021-08-17 NOTE — Telephone Encounter (Signed)
Pt wife called in to let PCP know that pt is still having cough and shortness of breath. Dry cough; shortness of breath is still about the same as last visit. (If pt is to be seen, he can only come on Tuesday or Thursday due to dialysis). Please advise. Thank you  CB# [phone]

## 2021-08-19 DIAGNOSIS — N186 End stage renal disease: Secondary | ICD-10-CM | POA: Diagnosis not present

## 2021-08-19 DIAGNOSIS — Z992 Dependence on renal dialysis: Secondary | ICD-10-CM | POA: Diagnosis not present

## 2021-08-19 DIAGNOSIS — N250 Renal osteodystrophy: Secondary | ICD-10-CM | POA: Diagnosis not present

## 2021-08-19 DIAGNOSIS — D631 Anemia in chronic kidney disease: Secondary | ICD-10-CM | POA: Diagnosis not present

## 2021-08-19 DIAGNOSIS — D509 Iron deficiency anemia, unspecified: Secondary | ICD-10-CM | POA: Diagnosis not present

## 2021-08-19 DIAGNOSIS — N2581 Secondary hyperparathyroidism of renal origin: Secondary | ICD-10-CM | POA: Diagnosis not present

## 2021-08-20 ENCOUNTER — Ambulatory Visit (INDEPENDENT_AMBULATORY_CARE_PROVIDER_SITE_OTHER): Payer: Medicare Other | Admitting: Interventional Cardiology

## 2021-08-20 ENCOUNTER — Encounter: Payer: Self-pay | Admitting: Interventional Cardiology

## 2021-08-20 VITALS — BP 128/72 | HR 90 | Ht 69.0 in | Wt 170.0 lb

## 2021-08-20 DIAGNOSIS — E782 Mixed hyperlipidemia: Secondary | ICD-10-CM | POA: Diagnosis not present

## 2021-08-20 DIAGNOSIS — R053 Chronic cough: Secondary | ICD-10-CM

## 2021-08-20 DIAGNOSIS — N186 End stage renal disease: Secondary | ICD-10-CM | POA: Diagnosis not present

## 2021-08-20 DIAGNOSIS — I350 Nonrheumatic aortic (valve) stenosis: Secondary | ICD-10-CM | POA: Diagnosis not present

## 2021-08-20 NOTE — Telephone Encounter (Signed)
Pt wife contacted and verbalized understanding.  

## 2021-08-20 NOTE — Patient Instructions (Signed)
Medication Instructions:  Your physician recommends that you continue on your current medications as directed. Please refer to the Current Medication list given to you today.  *If you need a refill on your cardiac medications before your next appointment, please call your pharmacy*    Testing/Procedures: ECHO  March- April 2024 Your physician has requested that you have an echocardiogram. Echocardiography is a painless test that uses sound waves to create images of your heart. It provides your doctor with information about the size and shape of your heart and how well your heart's chambers and valves are working. This procedure takes approximately one hour. There are no restrictions for this procedure.    Follow-Up: At [facility], you and your health needs are our priority.  As part of our continuing mission to provide you with exceptional heart care, we have created designated Provider Care Teams.  These Care Teams include your primary Cardiologist (physician) and Advanced Practice Providers (APPs -  Physician Assistants and Nurse Practitioners) who all work together to provide you with the care you need, when you need it.   Your next appointment:   1 year(s)  The format for your next appointment:   In Person  Provider:   Larae Grooms, MD {
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
## 2021-08-21 DIAGNOSIS — D631 Anemia in chronic kidney disease: Secondary | ICD-10-CM | POA: Diagnosis not present

## 2021-08-21 DIAGNOSIS — N2581 Secondary hyperparathyroidism of renal origin: Secondary | ICD-10-CM | POA: Diagnosis not present

## 2021-08-21 DIAGNOSIS — Z992 Dependence on renal dialysis: Secondary | ICD-10-CM | POA: Diagnosis not present

## 2021-08-21 DIAGNOSIS — D509 Iron deficiency anemia, unspecified: Secondary | ICD-10-CM | POA: Diagnosis not present

## 2021-08-21 DIAGNOSIS — N250 Renal osteodystrophy: Secondary | ICD-10-CM | POA: Diagnosis not present

## 2021-08-21 DIAGNOSIS — N186 End stage renal disease: Secondary | ICD-10-CM | POA: Diagnosis not present

## 2021-08-24 DIAGNOSIS — D509 Iron deficiency anemia, unspecified: Secondary | ICD-10-CM | POA: Diagnosis not present

## 2021-08-24 DIAGNOSIS — N250 Renal osteodystrophy: Secondary | ICD-10-CM | POA: Diagnosis not present

## 2021-08-24 DIAGNOSIS — D631 Anemia in chronic kidney disease: Secondary | ICD-10-CM | POA: Diagnosis not present

## 2021-08-24 DIAGNOSIS — Z992 Dependence on renal dialysis: Secondary | ICD-10-CM | POA: Diagnosis not present

## 2021-08-24 DIAGNOSIS — N186 End stage renal disease: Secondary | ICD-10-CM | POA: Diagnosis not present

## 2021-08-24 DIAGNOSIS — N2581 Secondary hyperparathyroidism of renal origin: Secondary | ICD-10-CM | POA: Diagnosis not present

## 2021-08-26 DIAGNOSIS — Z992 Dependence on renal dialysis: Secondary | ICD-10-CM | POA: Diagnosis not present

## 2021-08-26 DIAGNOSIS — N186 End stage renal disease: Secondary | ICD-10-CM | POA: Diagnosis not present

## 2021-08-26 DIAGNOSIS — D509 Iron deficiency anemia, unspecified: Secondary | ICD-10-CM | POA: Diagnosis not present

## 2021-08-26 DIAGNOSIS — N2581 Secondary hyperparathyroidism of renal origin: Secondary | ICD-10-CM | POA: Diagnosis not present

## 2021-08-26 DIAGNOSIS — N250 Renal osteodystrophy: Secondary | ICD-10-CM | POA: Diagnosis not present

## 2021-08-26 DIAGNOSIS — D631 Anemia in chronic kidney disease: Secondary | ICD-10-CM | POA: Diagnosis not present

## 2021-08-28 DIAGNOSIS — D631 Anemia in chronic kidney disease: Secondary | ICD-10-CM | POA: Diagnosis not present

## 2021-08-28 DIAGNOSIS — N250 Renal osteodystrophy: Secondary | ICD-10-CM | POA: Diagnosis not present

## 2021-08-28 DIAGNOSIS — Z992 Dependence on renal dialysis: Secondary | ICD-10-CM | POA: Diagnosis not present

## 2021-08-28 DIAGNOSIS — N186 End stage renal disease: Secondary | ICD-10-CM | POA: Diagnosis not present

## 2021-08-28 DIAGNOSIS — D509 Iron deficiency anemia, unspecified: Secondary | ICD-10-CM | POA: Diagnosis not present

## 2021-08-28 DIAGNOSIS — N2581 Secondary hyperparathyroidism of renal origin: Secondary | ICD-10-CM | POA: Diagnosis not present

## 2021-08-31 DIAGNOSIS — N2581 Secondary hyperparathyroidism of renal origin: Secondary | ICD-10-CM | POA: Diagnosis not present

## 2021-08-31 DIAGNOSIS — N186 End stage renal disease: Secondary | ICD-10-CM | POA: Diagnosis not present

## 2021-08-31 DIAGNOSIS — Z992 Dependence on renal dialysis: Secondary | ICD-10-CM | POA: Diagnosis not present

## 2021-08-31 DIAGNOSIS — D631 Anemia in chronic kidney disease: Secondary | ICD-10-CM | POA: Diagnosis not present

## 2021-08-31 DIAGNOSIS — N250 Renal osteodystrophy: Secondary | ICD-10-CM | POA: Diagnosis not present

## 2021-08-31 DIAGNOSIS — D509 Iron deficiency anemia, unspecified: Secondary | ICD-10-CM | POA: Diagnosis not present

## 2021-09-02 DIAGNOSIS — Z992 Dependence on renal dialysis: Secondary | ICD-10-CM | POA: Diagnosis not present

## 2021-09-02 DIAGNOSIS — N186 End stage renal disease: Secondary | ICD-10-CM | POA: Diagnosis not present

## 2021-09-02 DIAGNOSIS — N2581 Secondary hyperparathyroidism of renal origin: Secondary | ICD-10-CM | POA: Diagnosis not present

## 2021-09-02 DIAGNOSIS — D509 Iron deficiency anemia, unspecified: Secondary | ICD-10-CM | POA: Diagnosis not present

## 2021-09-02 DIAGNOSIS — N250 Renal osteodystrophy: Secondary | ICD-10-CM | POA: Diagnosis not present

## 2021-09-02 DIAGNOSIS — D631 Anemia in chronic kidney disease: Secondary | ICD-10-CM | POA: Diagnosis not present

## 2021-09-03 DIAGNOSIS — Z992 Dependence on renal dialysis: Secondary | ICD-10-CM | POA: Diagnosis not present

## 2021-09-03 DIAGNOSIS — N186 End stage renal disease: Secondary | ICD-10-CM | POA: Diagnosis not present

## 2021-09-04 DIAGNOSIS — D631 Anemia in chronic kidney disease: Secondary | ICD-10-CM | POA: Diagnosis not present

## 2021-09-04 DIAGNOSIS — D509 Iron deficiency anemia, unspecified: Secondary | ICD-10-CM | POA: Diagnosis not present

## 2021-09-04 DIAGNOSIS — N2581 Secondary hyperparathyroidism of renal origin: Secondary | ICD-10-CM | POA: Diagnosis not present

## 2021-09-04 DIAGNOSIS — E8779 Other fluid overload: Secondary | ICD-10-CM | POA: Diagnosis not present

## 2021-09-04 DIAGNOSIS — N186 End stage renal disease: Secondary | ICD-10-CM | POA: Diagnosis not present

## 2021-09-04 DIAGNOSIS — N250 Renal osteodystrophy: Secondary | ICD-10-CM | POA: Diagnosis not present

## 2021-09-04 DIAGNOSIS — Z992 Dependence on renal dialysis: Secondary | ICD-10-CM | POA: Diagnosis not present

## 2021-09-07 DIAGNOSIS — N2581 Secondary hyperparathyroidism of renal origin: Secondary | ICD-10-CM | POA: Diagnosis not present

## 2021-09-07 DIAGNOSIS — D631 Anemia in chronic kidney disease: Secondary | ICD-10-CM | POA: Diagnosis not present

## 2021-09-07 DIAGNOSIS — Z992 Dependence on renal dialysis: Secondary | ICD-10-CM | POA: Diagnosis not present

## 2021-09-07 DIAGNOSIS — N250 Renal osteodystrophy: Secondary | ICD-10-CM | POA: Diagnosis not present

## 2021-09-07 DIAGNOSIS — N186 End stage renal disease: Secondary | ICD-10-CM | POA: Diagnosis not present

## 2021-09-07 DIAGNOSIS — D509 Iron deficiency anemia, unspecified: Secondary | ICD-10-CM | POA: Diagnosis not present

## 2021-09-08 DIAGNOSIS — N186 End stage renal disease: Secondary | ICD-10-CM | POA: Diagnosis not present

## 2021-09-08 DIAGNOSIS — Z992 Dependence on renal dialysis: Secondary | ICD-10-CM | POA: Diagnosis not present

## 2021-09-08 DIAGNOSIS — R2232 Localized swelling, mass and lump, left upper limb: Secondary | ICD-10-CM | POA: Diagnosis not present

## 2021-09-09 DIAGNOSIS — N2581 Secondary hyperparathyroidism of renal origin: Secondary | ICD-10-CM | POA: Diagnosis not present

## 2021-09-09 DIAGNOSIS — Z992 Dependence on renal dialysis: Secondary | ICD-10-CM | POA: Diagnosis not present

## 2021-09-09 DIAGNOSIS — N250 Renal osteodystrophy: Secondary | ICD-10-CM | POA: Diagnosis not present

## 2021-09-09 DIAGNOSIS — D631 Anemia in chronic kidney disease: Secondary | ICD-10-CM | POA: Diagnosis not present

## 2021-09-09 DIAGNOSIS — D509 Iron deficiency anemia, unspecified: Secondary | ICD-10-CM | POA: Diagnosis not present

## 2021-09-09 DIAGNOSIS — N186 End stage renal disease: Secondary | ICD-10-CM | POA: Diagnosis not present

## 2021-09-10 DIAGNOSIS — D631 Anemia in chronic kidney disease: Secondary | ICD-10-CM | POA: Diagnosis not present

## 2021-09-10 DIAGNOSIS — Z992 Dependence on renal dialysis: Secondary | ICD-10-CM | POA: Diagnosis not present

## 2021-09-10 DIAGNOSIS — D509 Iron deficiency anemia, unspecified: Secondary | ICD-10-CM | POA: Diagnosis not present

## 2021-09-10 DIAGNOSIS — N250 Renal osteodystrophy: Secondary | ICD-10-CM | POA: Diagnosis not present

## 2021-09-10 DIAGNOSIS — N186 End stage renal disease: Secondary | ICD-10-CM | POA: Diagnosis not present

## 2021-09-10 DIAGNOSIS — N2581 Secondary hyperparathyroidism of renal origin: Secondary | ICD-10-CM | POA: Diagnosis not present

## 2021-09-11 DIAGNOSIS — D509 Iron deficiency anemia, unspecified: Secondary | ICD-10-CM | POA: Diagnosis not present

## 2021-09-11 DIAGNOSIS — D631 Anemia in chronic kidney disease: Secondary | ICD-10-CM | POA: Diagnosis not present

## 2021-09-11 DIAGNOSIS — N2581 Secondary hyperparathyroidism of renal origin: Secondary | ICD-10-CM | POA: Diagnosis not present

## 2021-09-11 DIAGNOSIS — N186 End stage renal disease: Secondary | ICD-10-CM | POA: Diagnosis not present

## 2021-09-11 DIAGNOSIS — N250 Renal osteodystrophy: Secondary | ICD-10-CM | POA: Diagnosis not present

## 2021-09-11 DIAGNOSIS — Z992 Dependence on renal dialysis: Secondary | ICD-10-CM | POA: Diagnosis not present

## 2021-09-14 DIAGNOSIS — D631 Anemia in chronic kidney disease: Secondary | ICD-10-CM | POA: Diagnosis not present

## 2021-09-14 DIAGNOSIS — N250 Renal osteodystrophy: Secondary | ICD-10-CM | POA: Diagnosis not present

## 2021-09-14 DIAGNOSIS — D509 Iron deficiency anemia, unspecified: Secondary | ICD-10-CM | POA: Diagnosis not present

## 2021-09-14 DIAGNOSIS — N186 End stage renal disease: Secondary | ICD-10-CM | POA: Diagnosis not present

## 2021-09-14 DIAGNOSIS — N2581 Secondary hyperparathyroidism of renal origin: Secondary | ICD-10-CM | POA: Diagnosis not present

## 2021-09-14 DIAGNOSIS — Z992 Dependence on renal dialysis: Secondary | ICD-10-CM | POA: Diagnosis not present

## 2021-09-16 DIAGNOSIS — D631 Anemia in chronic kidney disease: Secondary | ICD-10-CM | POA: Diagnosis not present

## 2021-09-16 DIAGNOSIS — N2581 Secondary hyperparathyroidism of renal origin: Secondary | ICD-10-CM | POA: Diagnosis not present

## 2021-09-16 DIAGNOSIS — N250 Renal osteodystrophy: Secondary | ICD-10-CM | POA: Diagnosis not present

## 2021-09-16 DIAGNOSIS — D509 Iron deficiency anemia, unspecified: Secondary | ICD-10-CM | POA: Diagnosis not present

## 2021-09-16 DIAGNOSIS — Z992 Dependence on renal dialysis: Secondary | ICD-10-CM | POA: Diagnosis not present

## 2021-09-16 DIAGNOSIS — N186 End stage renal disease: Secondary | ICD-10-CM | POA: Diagnosis not present

## 2021-09-17 DIAGNOSIS — N186 End stage renal disease: Secondary | ICD-10-CM | POA: Diagnosis not present

## 2021-09-17 DIAGNOSIS — Z992 Dependence on renal dialysis: Secondary | ICD-10-CM | POA: Diagnosis not present

## 2021-09-17 DIAGNOSIS — N250 Renal osteodystrophy: Secondary | ICD-10-CM | POA: Diagnosis not present

## 2021-09-17 DIAGNOSIS — D631 Anemia in chronic kidney disease: Secondary | ICD-10-CM | POA: Diagnosis not present

## 2021-09-17 DIAGNOSIS — D509 Iron deficiency anemia, unspecified: Secondary | ICD-10-CM | POA: Diagnosis not present

## 2021-09-17 DIAGNOSIS — N2581 Secondary hyperparathyroidism of renal origin: Secondary | ICD-10-CM | POA: Diagnosis not present

## 2021-09-18 DIAGNOSIS — N2581 Secondary hyperparathyroidism of renal origin: Secondary | ICD-10-CM | POA: Diagnosis not present

## 2021-09-18 DIAGNOSIS — N250 Renal osteodystrophy: Secondary | ICD-10-CM | POA: Diagnosis not present

## 2021-09-18 DIAGNOSIS — N186 End stage renal disease: Secondary | ICD-10-CM | POA: Diagnosis not present

## 2021-09-18 DIAGNOSIS — D509 Iron deficiency anemia, unspecified: Secondary | ICD-10-CM | POA: Diagnosis not present

## 2021-09-18 DIAGNOSIS — D631 Anemia in chronic kidney disease: Secondary | ICD-10-CM | POA: Diagnosis not present

## 2021-09-18 DIAGNOSIS — Z992 Dependence on renal dialysis: Secondary | ICD-10-CM | POA: Diagnosis not present

## 2021-09-21 DIAGNOSIS — N250 Renal osteodystrophy: Secondary | ICD-10-CM | POA: Diagnosis not present

## 2021-09-21 DIAGNOSIS — D509 Iron deficiency anemia, unspecified: Secondary | ICD-10-CM | POA: Diagnosis not present

## 2021-09-21 DIAGNOSIS — D631 Anemia in chronic kidney disease: Secondary | ICD-10-CM | POA: Diagnosis not present

## 2021-09-21 DIAGNOSIS — N186 End stage renal disease: Secondary | ICD-10-CM | POA: Diagnosis not present

## 2021-09-21 DIAGNOSIS — Z992 Dependence on renal dialysis: Secondary | ICD-10-CM | POA: Diagnosis not present

## 2021-09-21 DIAGNOSIS — N2581 Secondary hyperparathyroidism of renal origin: Secondary | ICD-10-CM | POA: Diagnosis not present

## 2021-09-23 DIAGNOSIS — N250 Renal osteodystrophy: Secondary | ICD-10-CM | POA: Diagnosis not present

## 2021-09-23 DIAGNOSIS — N186 End stage renal disease: Secondary | ICD-10-CM | POA: Diagnosis not present

## 2021-09-23 DIAGNOSIS — D509 Iron deficiency anemia, unspecified: Secondary | ICD-10-CM | POA: Diagnosis not present

## 2021-09-23 DIAGNOSIS — Z992 Dependence on renal dialysis: Secondary | ICD-10-CM | POA: Diagnosis not present

## 2021-09-23 DIAGNOSIS — N2581 Secondary hyperparathyroidism of renal origin: Secondary | ICD-10-CM | POA: Diagnosis not present

## 2021-09-23 DIAGNOSIS — D631 Anemia in chronic kidney disease: Secondary | ICD-10-CM | POA: Diagnosis not present

## 2021-09-25 DIAGNOSIS — N2581 Secondary hyperparathyroidism of renal origin: Secondary | ICD-10-CM | POA: Diagnosis not present

## 2021-09-25 DIAGNOSIS — N186 End stage renal disease: Secondary | ICD-10-CM | POA: Diagnosis not present

## 2021-09-25 DIAGNOSIS — N250 Renal osteodystrophy: Secondary | ICD-10-CM | POA: Diagnosis not present

## 2021-09-25 DIAGNOSIS — D631 Anemia in chronic kidney disease: Secondary | ICD-10-CM | POA: Diagnosis not present

## 2021-09-25 DIAGNOSIS — Z992 Dependence on renal dialysis: Secondary | ICD-10-CM | POA: Diagnosis not present

## 2021-09-25 DIAGNOSIS — D509 Iron deficiency anemia, unspecified: Secondary | ICD-10-CM | POA: Diagnosis not present

## 2021-09-28 DIAGNOSIS — Z992 Dependence on renal dialysis: Secondary | ICD-10-CM | POA: Diagnosis not present

## 2021-09-28 DIAGNOSIS — D509 Iron deficiency anemia, unspecified: Secondary | ICD-10-CM | POA: Diagnosis not present

## 2021-09-28 DIAGNOSIS — D631 Anemia in chronic kidney disease: Secondary | ICD-10-CM | POA: Diagnosis not present

## 2021-09-28 DIAGNOSIS — N250 Renal osteodystrophy: Secondary | ICD-10-CM | POA: Diagnosis not present

## 2021-09-28 DIAGNOSIS — N2581 Secondary hyperparathyroidism of renal origin: Secondary | ICD-10-CM | POA: Diagnosis not present

## 2021-09-28 DIAGNOSIS — N186 End stage renal disease: Secondary | ICD-10-CM | POA: Diagnosis not present

## 2021-09-30 DIAGNOSIS — N186 End stage renal disease: Secondary | ICD-10-CM | POA: Diagnosis not present

## 2021-09-30 DIAGNOSIS — N2581 Secondary hyperparathyroidism of renal origin: Secondary | ICD-10-CM | POA: Diagnosis not present

## 2021-09-30 DIAGNOSIS — N250 Renal osteodystrophy: Secondary | ICD-10-CM | POA: Diagnosis not present

## 2021-09-30 DIAGNOSIS — D509 Iron deficiency anemia, unspecified: Secondary | ICD-10-CM | POA: Diagnosis not present

## 2021-09-30 DIAGNOSIS — D631 Anemia in chronic kidney disease: Secondary | ICD-10-CM | POA: Diagnosis not present

## 2021-09-30 DIAGNOSIS — Z992 Dependence on renal dialysis: Secondary | ICD-10-CM | POA: Diagnosis not present

## 2021-10-02 DIAGNOSIS — D509 Iron deficiency anemia, unspecified: Secondary | ICD-10-CM | POA: Diagnosis not present

## 2021-10-02 DIAGNOSIS — N250 Renal osteodystrophy: Secondary | ICD-10-CM | POA: Diagnosis not present

## 2021-10-02 DIAGNOSIS — N186 End stage renal disease: Secondary | ICD-10-CM | POA: Diagnosis not present

## 2021-10-02 DIAGNOSIS — D631 Anemia in chronic kidney disease: Secondary | ICD-10-CM | POA: Diagnosis not present

## 2021-10-02 DIAGNOSIS — N2581 Secondary hyperparathyroidism of renal origin: Secondary | ICD-10-CM | POA: Diagnosis not present

## 2021-10-02 DIAGNOSIS — Z992 Dependence on renal dialysis: Secondary | ICD-10-CM | POA: Diagnosis not present

## 2021-10-03 DIAGNOSIS — N186 End stage renal disease: Secondary | ICD-10-CM | POA: Diagnosis not present

## 2021-10-03 DIAGNOSIS — Z992 Dependence on renal dialysis: Secondary | ICD-10-CM | POA: Diagnosis not present

## 2021-10-04 DIAGNOSIS — Z23 Encounter for immunization: Secondary | ICD-10-CM | POA: Diagnosis not present

## 2021-10-04 DIAGNOSIS — N186 End stage renal disease: Secondary | ICD-10-CM | POA: Diagnosis not present

## 2021-10-04 DIAGNOSIS — D631 Anemia in chronic kidney disease: Secondary | ICD-10-CM | POA: Diagnosis not present

## 2021-10-04 DIAGNOSIS — N250 Renal osteodystrophy: Secondary | ICD-10-CM | POA: Diagnosis not present

## 2021-10-04 DIAGNOSIS — Z992 Dependence on renal dialysis: Secondary | ICD-10-CM | POA: Diagnosis not present

## 2021-10-04 DIAGNOSIS — D509 Iron deficiency anemia, unspecified: Secondary | ICD-10-CM | POA: Diagnosis not present

## 2021-10-04 DIAGNOSIS — N2581 Secondary hyperparathyroidism of renal origin: Secondary | ICD-10-CM | POA: Diagnosis not present

## 2021-10-05 DIAGNOSIS — Z23 Encounter for immunization: Secondary | ICD-10-CM | POA: Diagnosis not present

## 2021-10-05 DIAGNOSIS — D509 Iron deficiency anemia, unspecified: Secondary | ICD-10-CM | POA: Diagnosis not present

## 2021-10-05 DIAGNOSIS — D631 Anemia in chronic kidney disease: Secondary | ICD-10-CM | POA: Diagnosis not present

## 2021-10-05 DIAGNOSIS — N186 End stage renal disease: Secondary | ICD-10-CM | POA: Diagnosis not present

## 2021-10-05 DIAGNOSIS — N2581 Secondary hyperparathyroidism of renal origin: Secondary | ICD-10-CM | POA: Diagnosis not present

## 2021-10-05 DIAGNOSIS — Z992 Dependence on renal dialysis: Secondary | ICD-10-CM | POA: Diagnosis not present

## 2021-10-07 DIAGNOSIS — N2581 Secondary hyperparathyroidism of renal origin: Secondary | ICD-10-CM | POA: Diagnosis not present

## 2021-10-07 DIAGNOSIS — D631 Anemia in chronic kidney disease: Secondary | ICD-10-CM | POA: Diagnosis not present

## 2021-10-07 DIAGNOSIS — Z23 Encounter for immunization: Secondary | ICD-10-CM | POA: Diagnosis not present

## 2021-10-07 DIAGNOSIS — N186 End stage renal disease: Secondary | ICD-10-CM | POA: Diagnosis not present

## 2021-10-07 DIAGNOSIS — Z992 Dependence on renal dialysis: Secondary | ICD-10-CM | POA: Diagnosis not present

## 2021-10-07 DIAGNOSIS — D509 Iron deficiency anemia, unspecified: Secondary | ICD-10-CM | POA: Diagnosis not present

## 2021-10-09 DIAGNOSIS — N2581 Secondary hyperparathyroidism of renal origin: Secondary | ICD-10-CM | POA: Diagnosis not present

## 2021-10-09 DIAGNOSIS — N186 End stage renal disease: Secondary | ICD-10-CM | POA: Diagnosis not present

## 2021-10-09 DIAGNOSIS — Z23 Encounter for immunization: Secondary | ICD-10-CM | POA: Diagnosis not present

## 2021-10-09 DIAGNOSIS — Z992 Dependence on renal dialysis: Secondary | ICD-10-CM | POA: Diagnosis not present

## 2021-10-09 DIAGNOSIS — D631 Anemia in chronic kidney disease: Secondary | ICD-10-CM | POA: Diagnosis not present

## 2021-10-09 DIAGNOSIS — D509 Iron deficiency anemia, unspecified: Secondary | ICD-10-CM | POA: Diagnosis not present

## 2021-10-10 DIAGNOSIS — D509 Iron deficiency anemia, unspecified: Secondary | ICD-10-CM | POA: Diagnosis not present

## 2021-10-10 DIAGNOSIS — D631 Anemia in chronic kidney disease: Secondary | ICD-10-CM | POA: Diagnosis not present

## 2021-10-10 DIAGNOSIS — Z992 Dependence on renal dialysis: Secondary | ICD-10-CM | POA: Diagnosis not present

## 2021-10-10 DIAGNOSIS — N186 End stage renal disease: Secondary | ICD-10-CM | POA: Diagnosis not present

## 2021-10-10 DIAGNOSIS — Z23 Encounter for immunization: Secondary | ICD-10-CM | POA: Diagnosis not present

## 2021-10-10 DIAGNOSIS — N2581 Secondary hyperparathyroidism of renal origin: Secondary | ICD-10-CM | POA: Diagnosis not present

## 2021-10-12 DIAGNOSIS — Z23 Encounter for immunization: Secondary | ICD-10-CM | POA: Diagnosis not present

## 2021-10-12 DIAGNOSIS — N186 End stage renal disease: Secondary | ICD-10-CM | POA: Diagnosis not present

## 2021-10-12 DIAGNOSIS — D509 Iron deficiency anemia, unspecified: Secondary | ICD-10-CM | POA: Diagnosis not present

## 2021-10-12 DIAGNOSIS — D631 Anemia in chronic kidney disease: Secondary | ICD-10-CM | POA: Diagnosis not present

## 2021-10-12 DIAGNOSIS — Z992 Dependence on renal dialysis: Secondary | ICD-10-CM | POA: Diagnosis not present

## 2021-10-12 DIAGNOSIS — N2581 Secondary hyperparathyroidism of renal origin: Secondary | ICD-10-CM | POA: Diagnosis not present

## 2021-10-14 DIAGNOSIS — D631 Anemia in chronic kidney disease: Secondary | ICD-10-CM | POA: Diagnosis not present

## 2021-10-14 DIAGNOSIS — N186 End stage renal disease: Secondary | ICD-10-CM | POA: Diagnosis not present

## 2021-10-14 DIAGNOSIS — N2581 Secondary hyperparathyroidism of renal origin: Secondary | ICD-10-CM | POA: Diagnosis not present

## 2021-10-14 DIAGNOSIS — Z992 Dependence on renal dialysis: Secondary | ICD-10-CM | POA: Diagnosis not present

## 2021-10-14 DIAGNOSIS — Z23 Encounter for immunization: Secondary | ICD-10-CM | POA: Diagnosis not present

## 2021-10-14 DIAGNOSIS — D509 Iron deficiency anemia, unspecified: Secondary | ICD-10-CM | POA: Diagnosis not present

## 2021-10-16 DIAGNOSIS — D631 Anemia in chronic kidney disease: Secondary | ICD-10-CM | POA: Diagnosis not present

## 2021-10-16 DIAGNOSIS — D509 Iron deficiency anemia, unspecified: Secondary | ICD-10-CM | POA: Diagnosis not present

## 2021-10-16 DIAGNOSIS — N2581 Secondary hyperparathyroidism of renal origin: Secondary | ICD-10-CM | POA: Diagnosis not present

## 2021-10-16 DIAGNOSIS — N186 End stage renal disease: Secondary | ICD-10-CM | POA: Diagnosis not present

## 2021-10-16 DIAGNOSIS — Z992 Dependence on renal dialysis: Secondary | ICD-10-CM | POA: Diagnosis not present

## 2021-10-16 DIAGNOSIS — Z23 Encounter for immunization: Secondary | ICD-10-CM | POA: Diagnosis not present

## 2021-10-19 DIAGNOSIS — N2581 Secondary hyperparathyroidism of renal origin: Secondary | ICD-10-CM | POA: Diagnosis not present

## 2021-10-19 DIAGNOSIS — D631 Anemia in chronic kidney disease: Secondary | ICD-10-CM | POA: Diagnosis not present

## 2021-10-19 DIAGNOSIS — D509 Iron deficiency anemia, unspecified: Secondary | ICD-10-CM | POA: Diagnosis not present

## 2021-10-19 DIAGNOSIS — Z23 Encounter for immunization: Secondary | ICD-10-CM | POA: Diagnosis not present

## 2021-10-19 DIAGNOSIS — Z992 Dependence on renal dialysis: Secondary | ICD-10-CM | POA: Diagnosis not present

## 2021-10-19 DIAGNOSIS — N186 End stage renal disease: Secondary | ICD-10-CM | POA: Diagnosis not present

## 2021-10-21 DIAGNOSIS — Z23 Encounter for immunization: Secondary | ICD-10-CM | POA: Diagnosis not present

## 2021-10-21 DIAGNOSIS — Z992 Dependence on renal dialysis: Secondary | ICD-10-CM | POA: Diagnosis not present

## 2021-10-21 DIAGNOSIS — N186 End stage renal disease: Secondary | ICD-10-CM | POA: Diagnosis not present

## 2021-10-21 DIAGNOSIS — D631 Anemia in chronic kidney disease: Secondary | ICD-10-CM | POA: Diagnosis not present

## 2021-10-21 DIAGNOSIS — N2581 Secondary hyperparathyroidism of renal origin: Secondary | ICD-10-CM | POA: Diagnosis not present

## 2021-10-21 DIAGNOSIS — D509 Iron deficiency anemia, unspecified: Secondary | ICD-10-CM | POA: Diagnosis not present

## 2021-10-23 DIAGNOSIS — Z23 Encounter for immunization: Secondary | ICD-10-CM | POA: Diagnosis not present

## 2021-10-23 DIAGNOSIS — Z992 Dependence on renal dialysis: Secondary | ICD-10-CM | POA: Diagnosis not present

## 2021-10-23 DIAGNOSIS — D509 Iron deficiency anemia, unspecified: Secondary | ICD-10-CM | POA: Diagnosis not present

## 2021-10-23 DIAGNOSIS — N2581 Secondary hyperparathyroidism of renal origin: Secondary | ICD-10-CM | POA: Diagnosis not present

## 2021-10-23 DIAGNOSIS — D631 Anemia in chronic kidney disease: Secondary | ICD-10-CM | POA: Diagnosis not present

## 2021-10-23 DIAGNOSIS — N186 End stage renal disease: Secondary | ICD-10-CM | POA: Diagnosis not present

## 2021-10-26 DIAGNOSIS — N186 End stage renal disease: Secondary | ICD-10-CM | POA: Diagnosis not present

## 2021-10-26 DIAGNOSIS — Z23 Encounter for immunization: Secondary | ICD-10-CM | POA: Diagnosis not present

## 2021-10-26 DIAGNOSIS — D509 Iron deficiency anemia, unspecified: Secondary | ICD-10-CM | POA: Diagnosis not present

## 2021-10-26 DIAGNOSIS — Z992 Dependence on renal dialysis: Secondary | ICD-10-CM | POA: Diagnosis not present

## 2021-10-26 DIAGNOSIS — N2581 Secondary hyperparathyroidism of renal origin: Secondary | ICD-10-CM | POA: Diagnosis not present

## 2021-10-26 DIAGNOSIS — D631 Anemia in chronic kidney disease: Secondary | ICD-10-CM | POA: Diagnosis not present

## 2021-10-28 DIAGNOSIS — D631 Anemia in chronic kidney disease: Secondary | ICD-10-CM | POA: Diagnosis not present

## 2021-10-28 DIAGNOSIS — N2581 Secondary hyperparathyroidism of renal origin: Secondary | ICD-10-CM | POA: Diagnosis not present

## 2021-10-28 DIAGNOSIS — Z23 Encounter for immunization: Secondary | ICD-10-CM | POA: Diagnosis not present

## 2021-10-28 DIAGNOSIS — D509 Iron deficiency anemia, unspecified: Secondary | ICD-10-CM | POA: Diagnosis not present

## 2021-10-28 DIAGNOSIS — N186 End stage renal disease: Secondary | ICD-10-CM | POA: Diagnosis not present

## 2021-10-28 DIAGNOSIS — Z992 Dependence on renal dialysis: Secondary | ICD-10-CM | POA: Diagnosis not present

## 2021-10-30 DIAGNOSIS — D631 Anemia in chronic kidney disease: Secondary | ICD-10-CM | POA: Diagnosis not present

## 2021-10-30 DIAGNOSIS — D509 Iron deficiency anemia, unspecified: Secondary | ICD-10-CM | POA: Diagnosis not present

## 2021-10-30 DIAGNOSIS — Z23 Encounter for immunization: Secondary | ICD-10-CM | POA: Diagnosis not present

## 2021-10-30 DIAGNOSIS — N186 End stage renal disease: Secondary | ICD-10-CM | POA: Diagnosis not present

## 2021-10-30 DIAGNOSIS — N2581 Secondary hyperparathyroidism of renal origin: Secondary | ICD-10-CM | POA: Diagnosis not present

## 2021-10-30 DIAGNOSIS — E119 Type 2 diabetes mellitus without complications: Secondary | ICD-10-CM | POA: Diagnosis not present

## 2021-10-30 DIAGNOSIS — Z992 Dependence on renal dialysis: Secondary | ICD-10-CM | POA: Diagnosis not present

## 2021-10-31 ENCOUNTER — Inpatient Hospital Stay (HOSPITAL_COMMUNITY)
Admission: EM | Admit: 2021-10-31 | Discharge: 2021-12-04 | DRG: 853 | Disposition: E | Payer: Medicare Other | Attending: Family Medicine | Admitting: Family Medicine

## 2021-10-31 ENCOUNTER — Emergency Department (HOSPITAL_COMMUNITY): Payer: Medicare Other

## 2021-10-31 ENCOUNTER — Encounter (HOSPITAL_COMMUNITY): Payer: Self-pay | Admitting: Emergency Medicine

## 2021-10-31 ENCOUNTER — Other Ambulatory Visit: Payer: Self-pay

## 2021-10-31 DIAGNOSIS — I959 Hypotension, unspecified: Secondary | ICD-10-CM

## 2021-10-31 DIAGNOSIS — D509 Iron deficiency anemia, unspecified: Secondary | ICD-10-CM | POA: Diagnosis present

## 2021-10-31 DIAGNOSIS — I5082 Biventricular heart failure: Secondary | ICD-10-CM | POA: Diagnosis present

## 2021-10-31 DIAGNOSIS — Z515 Encounter for palliative care: Secondary | ICD-10-CM | POA: Diagnosis not present

## 2021-10-31 DIAGNOSIS — I088 Other rheumatic multiple valve diseases: Secondary | ICD-10-CM | POA: Diagnosis not present

## 2021-10-31 DIAGNOSIS — I70261 Atherosclerosis of native arteries of extremities with gangrene, right leg: Secondary | ICD-10-CM | POA: Diagnosis not present

## 2021-10-31 DIAGNOSIS — R7881 Bacteremia: Secondary | ICD-10-CM | POA: Diagnosis not present

## 2021-10-31 DIAGNOSIS — L97429 Non-pressure chronic ulcer of left heel and midfoot with unspecified severity: Secondary | ICD-10-CM | POA: Diagnosis present

## 2021-10-31 DIAGNOSIS — K219 Gastro-esophageal reflux disease without esophagitis: Secondary | ICD-10-CM | POA: Diagnosis present

## 2021-10-31 DIAGNOSIS — D696 Thrombocytopenia, unspecified: Secondary | ICD-10-CM | POA: Diagnosis present

## 2021-10-31 DIAGNOSIS — Z66 Do not resuscitate: Secondary | ICD-10-CM | POA: Diagnosis present

## 2021-10-31 DIAGNOSIS — E1152 Type 2 diabetes mellitus with diabetic peripheral angiopathy with gangrene: Secondary | ICD-10-CM | POA: Diagnosis not present

## 2021-10-31 DIAGNOSIS — I953 Hypotension of hemodialysis: Secondary | ICD-10-CM | POA: Diagnosis not present

## 2021-10-31 DIAGNOSIS — Z6828 Body mass index (BMI) 28.0-28.9, adult: Secondary | ICD-10-CM

## 2021-10-31 DIAGNOSIS — I080 Rheumatic disorders of both mitral and aortic valves: Secondary | ICD-10-CM | POA: Diagnosis present

## 2021-10-31 DIAGNOSIS — I739 Peripheral vascular disease, unspecified: Secondary | ICD-10-CM | POA: Diagnosis not present

## 2021-10-31 DIAGNOSIS — I3139 Other pericardial effusion (noninflammatory): Secondary | ICD-10-CM | POA: Diagnosis present

## 2021-10-31 DIAGNOSIS — Z992 Dependence on renal dialysis: Secondary | ICD-10-CM | POA: Diagnosis not present

## 2021-10-31 DIAGNOSIS — E872 Acidosis, unspecified: Secondary | ICD-10-CM | POA: Diagnosis not present

## 2021-10-31 DIAGNOSIS — F32A Depression, unspecified: Secondary | ICD-10-CM | POA: Diagnosis present

## 2021-10-31 DIAGNOSIS — A4159 Other Gram-negative sepsis: Secondary | ICD-10-CM | POA: Diagnosis not present

## 2021-10-31 DIAGNOSIS — Z1152 Encounter for screening for COVID-19: Secondary | ICD-10-CM | POA: Diagnosis not present

## 2021-10-31 DIAGNOSIS — E11628 Type 2 diabetes mellitus with other skin complications: Secondary | ICD-10-CM | POA: Diagnosis not present

## 2021-10-31 DIAGNOSIS — Z79899 Other long term (current) drug therapy: Secondary | ICD-10-CM

## 2021-10-31 DIAGNOSIS — I082 Rheumatic disorders of both aortic and tricuspid valves: Secondary | ICD-10-CM | POA: Diagnosis present

## 2021-10-31 DIAGNOSIS — I96 Gangrene, not elsewhere classified: Secondary | ICD-10-CM | POA: Insufficient documentation

## 2021-10-31 DIAGNOSIS — D631 Anemia in chronic kidney disease: Secondary | ICD-10-CM | POA: Diagnosis not present

## 2021-10-31 DIAGNOSIS — E11319 Type 2 diabetes mellitus with unspecified diabetic retinopathy without macular edema: Secondary | ICD-10-CM | POA: Diagnosis present

## 2021-10-31 DIAGNOSIS — E11621 Type 2 diabetes mellitus with foot ulcer: Secondary | ICD-10-CM | POA: Diagnosis present

## 2021-10-31 DIAGNOSIS — R262 Difficulty in walking, not elsewhere classified: Secondary | ICD-10-CM | POA: Diagnosis present

## 2021-10-31 DIAGNOSIS — I440 Atrioventricular block, first degree: Secondary | ICD-10-CM | POA: Diagnosis present

## 2021-10-31 DIAGNOSIS — E43 Unspecified severe protein-calorie malnutrition: Secondary | ICD-10-CM | POA: Diagnosis present

## 2021-10-31 DIAGNOSIS — J918 Pleural effusion in other conditions classified elsewhere: Secondary | ICD-10-CM | POA: Diagnosis present

## 2021-10-31 DIAGNOSIS — I350 Nonrheumatic aortic (valve) stenosis: Secondary | ICD-10-CM | POA: Diagnosis not present

## 2021-10-31 DIAGNOSIS — N186 End stage renal disease: Secondary | ICD-10-CM | POA: Diagnosis present

## 2021-10-31 DIAGNOSIS — I120 Hypertensive chronic kidney disease with stage 5 chronic kidney disease or end stage renal disease: Secondary | ICD-10-CM | POA: Diagnosis not present

## 2021-10-31 DIAGNOSIS — E871 Hypo-osmolality and hyponatremia: Secondary | ICD-10-CM | POA: Diagnosis present

## 2021-10-31 DIAGNOSIS — E46 Unspecified protein-calorie malnutrition: Secondary | ICD-10-CM | POA: Diagnosis not present

## 2021-10-31 DIAGNOSIS — I358 Other nonrheumatic aortic valve disorders: Secondary | ICD-10-CM | POA: Diagnosis not present

## 2021-10-31 DIAGNOSIS — N2581 Secondary hyperparathyroidism of renal origin: Secondary | ICD-10-CM | POA: Diagnosis present

## 2021-10-31 DIAGNOSIS — J9811 Atelectasis: Secondary | ICD-10-CM | POA: Diagnosis not present

## 2021-10-31 DIAGNOSIS — I509 Heart failure, unspecified: Secondary | ICD-10-CM | POA: Diagnosis not present

## 2021-10-31 DIAGNOSIS — I330 Acute and subacute infective endocarditis: Secondary | ICD-10-CM | POA: Diagnosis present

## 2021-10-31 DIAGNOSIS — E8809 Other disorders of plasma-protein metabolism, not elsewhere classified: Secondary | ICD-10-CM | POA: Diagnosis present

## 2021-10-31 DIAGNOSIS — Z794 Long term (current) use of insulin: Secondary | ICD-10-CM

## 2021-10-31 DIAGNOSIS — L03031 Cellulitis of right toe: Secondary | ICD-10-CM | POA: Diagnosis present

## 2021-10-31 DIAGNOSIS — D72829 Elevated white blood cell count, unspecified: Secondary | ICD-10-CM | POA: Diagnosis present

## 2021-10-31 DIAGNOSIS — R918 Other nonspecific abnormal finding of lung field: Secondary | ICD-10-CM | POA: Diagnosis not present

## 2021-10-31 DIAGNOSIS — E1122 Type 2 diabetes mellitus with diabetic chronic kidney disease: Secondary | ICD-10-CM | POA: Diagnosis not present

## 2021-10-31 DIAGNOSIS — Z833 Family history of diabetes mellitus: Secondary | ICD-10-CM

## 2021-10-31 DIAGNOSIS — R7989 Other specified abnormal findings of blood chemistry: Secondary | ICD-10-CM | POA: Diagnosis not present

## 2021-10-31 DIAGNOSIS — I132 Hypertensive heart and chronic kidney disease with heart failure and with stage 5 chronic kidney disease, or end stage renal disease: Secondary | ICD-10-CM | POA: Diagnosis present

## 2021-10-31 DIAGNOSIS — I5021 Acute systolic (congestive) heart failure: Secondary | ICD-10-CM | POA: Diagnosis not present

## 2021-10-31 DIAGNOSIS — L03119 Cellulitis of unspecified part of limb: Secondary | ICD-10-CM

## 2021-10-31 DIAGNOSIS — I255 Ischemic cardiomyopathy: Secondary | ICD-10-CM | POA: Diagnosis present

## 2021-10-31 DIAGNOSIS — I5041 Acute combined systolic (congestive) and diastolic (congestive) heart failure: Secondary | ICD-10-CM | POA: Diagnosis not present

## 2021-10-31 DIAGNOSIS — R0602 Shortness of breath: Secondary | ICD-10-CM | POA: Diagnosis not present

## 2021-10-31 DIAGNOSIS — E876 Hypokalemia: Secondary | ICD-10-CM | POA: Diagnosis present

## 2021-10-31 DIAGNOSIS — R600 Localized edema: Secondary | ICD-10-CM | POA: Diagnosis not present

## 2021-10-31 DIAGNOSIS — E782 Mixed hyperlipidemia: Secondary | ICD-10-CM | POA: Diagnosis present

## 2021-10-31 DIAGNOSIS — I351 Nonrheumatic aortic (valve) insufficiency: Secondary | ICD-10-CM | POA: Diagnosis not present

## 2021-10-31 DIAGNOSIS — J90 Pleural effusion, not elsewhere classified: Secondary | ICD-10-CM | POA: Diagnosis not present

## 2021-10-31 DIAGNOSIS — A419 Sepsis, unspecified organism: Secondary | ICD-10-CM | POA: Diagnosis not present

## 2021-10-31 DIAGNOSIS — R059 Cough, unspecified: Secondary | ICD-10-CM | POA: Diagnosis not present

## 2021-10-31 DIAGNOSIS — R9431 Abnormal electrocardiogram [ECG] [EKG]: Secondary | ICD-10-CM | POA: Diagnosis not present

## 2021-10-31 DIAGNOSIS — Z86010 Personal history of colonic polyps: Secondary | ICD-10-CM

## 2021-10-31 DIAGNOSIS — I2510 Atherosclerotic heart disease of native coronary artery without angina pectoris: Secondary | ICD-10-CM | POA: Diagnosis present

## 2021-10-31 DIAGNOSIS — R188 Other ascites: Secondary | ICD-10-CM | POA: Diagnosis present

## 2021-10-31 DIAGNOSIS — M898X9 Other specified disorders of bone, unspecified site: Secondary | ICD-10-CM | POA: Diagnosis present

## 2021-10-31 DIAGNOSIS — J849 Interstitial pulmonary disease, unspecified: Secondary | ICD-10-CM | POA: Diagnosis present

## 2021-10-31 DIAGNOSIS — H548 Legal blindness, as defined in USA: Secondary | ICD-10-CM | POA: Diagnosis present

## 2021-10-31 DIAGNOSIS — I9589 Other hypotension: Secondary | ICD-10-CM | POA: Diagnosis present

## 2021-10-31 DIAGNOSIS — I493 Ventricular premature depolarization: Secondary | ICD-10-CM | POA: Diagnosis present

## 2021-10-31 DIAGNOSIS — I5043 Acute on chronic combined systolic (congestive) and diastolic (congestive) heart failure: Secondary | ICD-10-CM | POA: Diagnosis not present

## 2021-10-31 DIAGNOSIS — E1140 Type 2 diabetes mellitus with diabetic neuropathy, unspecified: Secondary | ICD-10-CM | POA: Diagnosis present

## 2021-10-31 DIAGNOSIS — I10 Essential (primary) hypertension: Secondary | ICD-10-CM

## 2021-10-31 DIAGNOSIS — L97519 Non-pressure chronic ulcer of other part of right foot with unspecified severity: Secondary | ICD-10-CM | POA: Diagnosis present

## 2021-10-31 DIAGNOSIS — Z7982 Long term (current) use of aspirin: Secondary | ICD-10-CM

## 2021-10-31 DIAGNOSIS — I700 Atherosclerosis of aorta: Secondary | ICD-10-CM | POA: Diagnosis present

## 2021-10-31 DIAGNOSIS — E663 Overweight: Secondary | ICD-10-CM | POA: Diagnosis present

## 2021-10-31 DIAGNOSIS — I359 Nonrheumatic aortic valve disorder, unspecified: Secondary | ICD-10-CM | POA: Diagnosis not present

## 2021-10-31 DIAGNOSIS — I339 Acute and subacute endocarditis, unspecified: Secondary | ICD-10-CM | POA: Diagnosis not present

## 2021-10-31 DIAGNOSIS — Z7189 Other specified counseling: Secondary | ICD-10-CM | POA: Diagnosis not present

## 2021-10-31 DIAGNOSIS — Z7902 Long term (current) use of antithrombotics/antiplatelets: Secondary | ICD-10-CM

## 2021-10-31 DIAGNOSIS — N250 Renal osteodystrophy: Secondary | ICD-10-CM | POA: Diagnosis not present

## 2021-10-31 DIAGNOSIS — I352 Nonrheumatic aortic (valve) stenosis with insufficiency: Secondary | ICD-10-CM | POA: Diagnosis not present

## 2021-10-31 DIAGNOSIS — E1165 Type 2 diabetes mellitus with hyperglycemia: Secondary | ICD-10-CM | POA: Diagnosis present

## 2021-10-31 DIAGNOSIS — R7401 Elevation of levels of liver transaminase levels: Secondary | ICD-10-CM

## 2021-10-31 HISTORY — DX: Nonrheumatic aortic (valve) stenosis: I35.0

## 2021-10-31 LAB — CBC WITH DIFFERENTIAL/PLATELET
Abs Immature Granulocytes: 0.11 10*3/uL — ABNORMAL HIGH (ref 0.00–0.07)
Basophils Absolute: 0.1 10*3/uL (ref 0.0–0.1)
Basophils Relative: 0 %
Eosinophils Absolute: 0 10*3/uL (ref 0.0–0.5)
Eosinophils Relative: 0 %
HCT: 40.1 % (ref 39.0–52.0)
Hemoglobin: 12.6 g/dL — ABNORMAL LOW (ref 13.0–17.0)
Immature Granulocytes: 1 %
Lymphocytes Relative: 2 %
Lymphs Abs: 0.4 10*3/uL — ABNORMAL LOW (ref 0.7–4.0)
MCH: 28.6 pg (ref 26.0–34.0)
MCHC: 31.4 g/dL (ref 30.0–36.0)
MCV: 91.1 fL (ref 80.0–100.0)
Monocytes Absolute: 1.4 10*3/uL — ABNORMAL HIGH (ref 0.1–1.0)
Monocytes Relative: 7 %
Neutro Abs: 17.6 10*3/uL — ABNORMAL HIGH (ref 1.7–7.7)
Neutrophils Relative %: 90 %
Platelets: 185 10*3/uL (ref 150–400)
RBC: 4.4 MIL/uL (ref 4.22–5.81)
RDW: 20.9 % — ABNORMAL HIGH (ref 11.5–15.5)
WBC: 19.6 10*3/uL — ABNORMAL HIGH (ref 4.0–10.5)
nRBC: 0.1 % (ref 0.0–0.2)

## 2021-10-31 LAB — BRAIN NATRIURETIC PEPTIDE: B Natriuretic Peptide: >4500 pg/mL — ABNORMAL HIGH (ref 0.0–100.0)

## 2021-10-31 LAB — COMPREHENSIVE METABOLIC PANEL
ALT: 47 U/L — ABNORMAL HIGH (ref 0–44)
AST: 57 U/L — ABNORMAL HIGH (ref 15–41)
Albumin: 3.3 g/dL — ABNORMAL LOW (ref 3.5–5.0)
Alkaline Phosphatase: 112 U/L (ref 38–126)
Anion gap: 17 — ABNORMAL HIGH (ref 5–15)
BUN: 45 mg/dL — ABNORMAL HIGH (ref 6–20)
CO2: 25 mmol/L (ref 22–32)
Calcium: 10 mg/dL (ref 8.9–10.3)
Chloride: 93 mmol/L — ABNORMAL LOW (ref 98–111)
Creatinine, Ser: 5.14 mg/dL — ABNORMAL HIGH (ref 0.61–1.24)
GFR, Estimated: 12 mL/min — ABNORMAL LOW (ref >60)
Glucose, Bld: 173 mg/dL — ABNORMAL HIGH (ref 70–99)
Potassium: 4.9 mmol/L (ref 3.5–5.1)
Sodium: 135 mmol/L (ref 135–145)
Total Bilirubin: 1.6 mg/dL — ABNORMAL HIGH (ref 0.3–1.2)
Total Protein: 7.4 g/dL (ref 6.5–8.1)

## 2021-10-31 LAB — APTT: aPTT: 36 seconds (ref 24–36)

## 2021-10-31 LAB — PROTIME-INR
INR: 1.6 — ABNORMAL HIGH (ref 0.8–1.2)
Prothrombin Time: 19.1 seconds — ABNORMAL HIGH (ref 11.4–15.2)

## 2021-10-31 LAB — CBG MONITORING, ED: Glucose-Capillary: 199 mg/dL — ABNORMAL HIGH (ref 70–99)

## 2021-10-31 LAB — LACTIC ACID, PLASMA
Lactic Acid, Venous: 2.4 mmol/L (ref 0.5–1.9)
Lactic Acid, Venous: 2.8 mmol/L (ref 0.5–1.9)
Lactic Acid, Venous: 2.1 mmol/L (ref 0.5–1.9)

## 2021-10-31 LAB — RESP PANEL BY RT-PCR (FLU A&B, COVID) ARPGX2
Influenza A by PCR: NEGATIVE
Influenza B by PCR: NEGATIVE
SARS Coronavirus 2 by RT PCR: NEGATIVE

## 2021-10-31 MED ORDER — SODIUM CHLORIDE 0.9 % IV SOLN
2.0000 g | Freq: Once | INTRAVENOUS | Status: AC
  Administered 2021-10-31: 2 g via INTRAVENOUS
  Filled 2021-10-31: qty 12.5

## 2021-10-31 MED ORDER — ACETAMINOPHEN 325 MG PO TABS
650.0000 mg | ORAL_TABLET | Freq: Four times a day (QID) | ORAL | Status: DC | PRN
  Administered 2021-11-07 – 2021-11-08 (×2): 650 mg via ORAL
  Filled 2021-10-31 (×2): qty 2

## 2021-10-31 MED ORDER — INSULIN ASPART 100 UNIT/ML IJ SOLN
0.0000 [IU] | Freq: Three times a day (TID) | INTRAMUSCULAR | Status: DC
  Administered 2021-11-01: 3 [IU] via SUBCUTANEOUS
  Administered 2021-11-01: 2 [IU] via SUBCUTANEOUS
  Administered 2021-11-01 – 2021-11-03 (×5): 1 [IU] via SUBCUTANEOUS
  Administered 2021-11-04: 2 [IU] via SUBCUTANEOUS
  Administered 2021-11-04 – 2021-11-05 (×2): 1 [IU] via SUBCUTANEOUS
  Administered 2021-11-07: 2 [IU] via SUBCUTANEOUS
  Administered 2021-11-07: 3 [IU] via SUBCUTANEOUS
  Administered 2021-11-08: 1 [IU] via SUBCUTANEOUS
  Administered 2021-11-08: 2 [IU] via SUBCUTANEOUS
  Administered 2021-11-08 – 2021-11-10 (×4): 1 [IU] via SUBCUTANEOUS
  Administered 2021-11-10: 2 [IU] via SUBCUTANEOUS
  Administered 2021-11-10 – 2021-11-11 (×2): 1 [IU] via SUBCUTANEOUS
  Filled 2021-10-31 (×6): qty 1

## 2021-10-31 MED ORDER — VANCOMYCIN HCL 750 MG/150ML IV SOLN
750.0000 mg | INTRAVENOUS | Status: DC
  Filled 2021-10-31: qty 150

## 2021-10-31 MED ORDER — ATORVASTATIN CALCIUM 10 MG PO TABS
20.0000 mg | ORAL_TABLET | Freq: Every day | ORAL | Status: DC
  Administered 2021-11-01 – 2021-11-11 (×10): 20 mg via ORAL
  Filled 2021-10-31 (×10): qty 2

## 2021-10-31 MED ORDER — VANCOMYCIN HCL IN DEXTROSE 1-5 GM/200ML-% IV SOLN
1000.0000 mg | Freq: Once | INTRAVENOUS | Status: AC
  Administered 2021-10-31: 1000 mg via INTRAVENOUS
  Filled 2021-10-31: qty 200

## 2021-10-31 MED ORDER — VANCOMYCIN HCL 500 MG/100ML IV SOLN
500.0000 mg | Freq: Once | INTRAVENOUS | Status: AC
  Administered 2021-10-31: 500 mg via INTRAVENOUS
  Filled 2021-10-31 (×2): qty 100

## 2021-10-31 MED ORDER — SODIUM CHLORIDE 0.9 % IV SOLN: 2.0000 g | INTRAVENOUS | Status: DC

## 2021-10-31 MED ORDER — MIDODRINE HCL 5 MG PO TABS
5.0000 mg | ORAL_TABLET | Freq: Once | ORAL | Status: AC
  Administered 2021-10-31: 5 mg via ORAL
  Filled 2021-10-31: qty 1

## 2021-10-31 MED ORDER — INSULIN GLARGINE-YFGN 100 UNIT/ML ~~LOC~~ SOLN
8.0000 [IU] | Freq: Every day | SUBCUTANEOUS | Status: DC
  Administered 2021-11-01 – 2021-11-09 (×9): 8 [IU] via SUBCUTANEOUS
  Filled 2021-10-31 (×11): qty 0.08

## 2021-10-31 MED ORDER — SODIUM CHLORIDE 0.9 % IV BOLUS
1000.0000 mL | Freq: Once | INTRAVENOUS | Status: AC
  Administered 2021-10-31: 1000 mL via INTRAVENOUS

## 2021-10-31 MED ORDER — ACETAMINOPHEN 650 MG RE SUPP: 650.0000 mg | Freq: Four times a day (QID) | RECTAL | Status: DC | PRN

## 2021-10-31 MED ORDER — SODIUM CHLORIDE 0.9 % IV SOLN
500.0000 mg | INTRAVENOUS | Status: DC
  Administered 2021-10-31 – 2021-11-01 (×2): 500 mg via INTRAVENOUS
  Filled 2021-10-31 (×2): qty 5

## 2021-10-31 MED ORDER — PANTOPRAZOLE SODIUM 40 MG PO TBEC
40.0000 mg | DELAYED_RELEASE_TABLET | Freq: Every day | ORAL | Status: DC
  Administered 2021-11-01 – 2021-11-11 (×10): 40 mg via ORAL
  Filled 2021-10-31 (×10): qty 1

## 2021-10-31 MED ORDER — CALCIUM ACETATE (PHOS BINDER) 667 MG PO CAPS
667.0000 mg | ORAL_CAPSULE | Freq: Three times a day (TID) | ORAL | Status: DC
  Administered 2021-11-01 – 2021-11-10 (×21): 667 mg via ORAL
  Filled 2021-10-31 (×21): qty 1

## 2021-10-31 MED ORDER — MIDODRINE HCL 5 MG PO TABS: 5.0000 mg | ORAL_TABLET | Freq: Every day | ORAL | Status: DC

## 2021-10-31 MED ORDER — MIDODRINE HCL 5 MG PO TABS
5.0000 mg | ORAL_TABLET | Freq: Every day | ORAL | Status: DC
  Administered 2021-11-01: 5 mg via ORAL
  Filled 2021-10-31: qty 1

## 2021-10-31 MED ORDER — GLUCERNA SHAKE PO LIQD
237.0000 mL | Freq: Three times a day (TID) | ORAL | Status: DC
  Administered 2021-11-01 – 2021-11-10 (×17): 237 mL via ORAL
  Filled 2021-10-31 (×16): qty 237

## 2021-10-31 MED ORDER — SODIUM CHLORIDE 0.9 % IV SOLN
2.0000 g | INTRAVENOUS | Status: DC
  Administered 2021-10-31: 2 g via INTRAVENOUS
  Filled 2021-10-31: qty 20

## 2021-10-31 MED ORDER — HEPARIN SODIUM (PORCINE) 5000 UNIT/ML IJ SOLN
5000.0000 [IU] | Freq: Three times a day (TID) | INTRAMUSCULAR | Status: DC
  Administered 2021-10-31 – 2021-11-01 (×3): 5000 [IU] via SUBCUTANEOUS
  Filled 2021-10-31 (×3): qty 1

## 2021-10-31 MED ORDER — ONDANSETRON HCL 4 MG/2ML IJ SOLN
4.0000 mg | Freq: Four times a day (QID) | INTRAMUSCULAR | Status: DC | PRN
  Administered 2021-11-01 – 2021-11-09 (×3): 4 mg via INTRAVENOUS
  Filled 2021-10-31 (×2): qty 2

## 2021-10-31 MED ORDER — VANCOMYCIN HCL 1500 MG/300ML IV SOLN
1500.0000 mg | Freq: Once | INTRAVENOUS | Status: DC
  Filled 2021-10-31: qty 300

## 2021-10-31 MED ORDER — FERROUS SULFATE 325 (65 FE) MG PO TABS
325.0000 mg | ORAL_TABLET | Freq: Every day | ORAL | Status: DC
  Administered 2021-11-01 – 2021-11-11 (×9): 325 mg via ORAL
  Filled 2021-10-31 (×10): qty 1

## 2021-10-31 MED ORDER — INSULIN ASPART 100 UNIT/ML IJ SOLN
0.0000 [IU] | Freq: Every day | INTRAMUSCULAR | Status: DC
  Administered 2021-11-04 – 2021-11-09 (×2): 2 [IU] via SUBCUTANEOUS
  Administered 2021-11-10: 3 [IU] via SUBCUTANEOUS

## 2021-10-31 MED ORDER — ONDANSETRON HCL 4 MG PO TABS: 4.0000 mg | ORAL_TABLET | Freq: Four times a day (QID) | ORAL | Status: DC | PRN

## 2021-10-31 NOTE — ED Notes (Signed)
Date and time results received: 10/22/2021 1640 (use smartphrase ".now" to insert current time)  Test: lactic acid  Critical Value: 2.4  Name of Provider Notified: Dr. Roderic Palau  Orders Received? Or Actions Taken?: Orders Received - See Orders for details

## 2021-10-31 NOTE — Sepsis Progress Note (Addendum)
Elink following code sepsis  1800 message sent asking for 3rd lactic

## 2021-10-31 NOTE — ED Provider Notes (Signed)
[facility] EMERGENCY DEPARTMENT Provider Note   CSN: [id number] Arrival date & time: 10/30/2021  1410     History {Add pertinent medical, surgical, social history, OB history to HPI:1} Chief Complaint  Patient presents with   Shortness of Breath    Paul Gonzalez is a 56 y.o. male.  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  He presents today with a cough for a week and some shortness of breath and also ulcers on his feet.   Shortness of Breath      Home Medications Prior to Admission medications   Medication Sig Start Date End Date Taking? Authorizing Provider  atorvastatin (LIPITOR) 20 MG tablet Take 1 tablet (20 mg total) by mouth daily. 06/04/21  Yes Cook, Jayce G, DO  calcium acetate (PHOSLO) 667 MG capsule Take 667 mg by mouth 3 (three) times daily with meals. 10/05/20  Yes [provider]  citalopram (CELEXA) 40 MG tablet Take 1 tablet (40 mg total) by mouth daily. 06/04/21  Yes Cook, Jayce G, DO  cyclobenzaprine (FLEXERIL) 5 MG tablet Take 1 tablet (5 mg total) by mouth 2 (two) times daily as needed for muscle spasms. 06/04/21  Yes Cook, Jayce G, DO  furosemide (LASIX) 80 MG tablet Take 80 mg by mouth daily.   Yes [provider]  insulin NPH Human (NOVOLIN N) 100 UNIT/ML injection Inject 0.1-0.25 mLs (10-25 Units total) into the skin at bedtime. 06/04/21  Yes Cook, Jayce G, DO  loratadine (CLARITIN) 10 MG tablet Take 1 tablet (10 mg total) by mouth daily. Patient taking differently: Take 10 mg by mouth daily as needed for allergies. 06/04/21  Yes Cook, Jayce G, DO  midodrine (PROAMATINE) 5 MG tablet Take 5 mg by mouth daily. 10/20/21  Yes [provider]  Multiple Vitamin (MULTIVITAMIN) tablet Take 1 tablet by mouth daily.   Yes [provider]  pantoprazole (PROTONIX) 40 MG tablet Take 1 tablet (40 mg total) by mouth daily. 07/23/21  Yes Ameduite, Trenton Gammon, NP   Aloe-Sodium Chloride (AYR SALINE NASAL GEL NA) Place into the nose daily.    [provider]  Insulin Syringe-Needle U-100 (BD VEO INSULIN SYRINGE U/F) 31G X 15/64" 0.3 ML MISC USE AS DIRECTED 04/04/20   Elvia Collum M, DO      Allergies    Patient has no known allergies.    Review of Systems   Review of Systems  Respiratory:  Positive for shortness of breath.     Physical Exam Updated Vital Signs BP (!) 105/52   Pulse 94   Temp 98.5 F (36.9 C)   Resp 18   Ht '5\' 9"'$  (1.753 m)   Wt 77.1 kg   SpO2 95%   BMI 25.10 kg/m  Physical Exam  ED Results / Procedures / Treatments   Labs (all labs ordered are listed, but only abnormal results are displayed) Labs Reviewed  LACTIC ACID, PLASMA - Abnormal; Notable for the following components:      Result Value   Lactic Acid, Venous 2.4 (*)    All other components within normal limits  COMPREHENSIVE METABOLIC PANEL - Abnormal; Notable for the following components:   Chloride 93 (*)    Glucose, Bld 173 (*)    BUN 45 (*)    Creatinine, Ser 5.14 (*)    Albumin 3.3 (*)    AST 57 (*)    ALT 47 (*)  Total Bilirubin 1.6 (*)    GFR, Estimated 12 (*)    Anion gap 17 (*)    All other components within normal limits  CBC WITH DIFFERENTIAL/PLATELET - Abnormal; Notable for the following components:   WBC 19.6 (*)    Hemoglobin 12.6 (*)    RDW 20.9 (*)    Neutro Abs 17.6 (*)    Lymphs Abs 0.4 (*)    Monocytes Absolute 1.4 (*)    Abs Immature Granulocytes 0.11 (*)    All other components within normal limits  PROTIME-INR - Abnormal; Notable for the following components:   Prothrombin Time 19.1 (*)    INR 1.6 (*)    All other components within normal limits  BRAIN NATRIURETIC PEPTIDE - Abnormal; Notable for the following components:   B Natriuretic Peptide >4,500.0 (*)    All other components within normal limits  RESP PANEL BY RT-PCR (FLU A&B, COVID) ARPGX2  CULTURE, BLOOD (ROUTINE X 2)  CULTURE, BLOOD (ROUTINE X 2)   URINE CULTURE  APTT  LACTIC ACID, PLASMA  URINALYSIS, ROUTINE W REFLEX MICROSCOPIC    EKG None  Radiology CT Chest Wo Contrast  Result Date: 10/15/2021 CLINICAL DATA:  Cough and shortness of breath. EXAM: CT CHEST WITHOUT CONTRAST TECHNIQUE: Multidetector CT imaging of the chest was performed following the standard protocol without IV contrast. RADIATION DOSE REDUCTION: This exam was performed according to the departmental dose-optimization program which includes automated exposure control, adjustment of the mA and/or kV according to patient size and/or use of iterative reconstruction technique. COMPARISON:  CT chest 03/02/2013 FINDINGS: Cardiovascular: Heart is borderline enlarged to mildly increased. No pericardial effusion. Coronary artery calcification is evident. Aortic valve calcification evident. Mild atherosclerotic calcification is noted in the wall of the thoracic aorta. Mediastinum/Nodes: Scattered small to upper normal mediastinal lymph nodes evident including 10 mm short axis precarinal node on 56/2. No evidence for gross hilar lymphadenopathy although assessment is limited by the lack of intravenous contrast on the current study. The esophagus has normal imaging features. Upper normal left axillary and subpectoral lymphadenopathy evident. Lungs/Pleura: Large right and moderate left pleural effusions noted with bibasilar lower lobe collapse/consolidative disease. Patchy ground-glass opacity in both lungs may be related to edema. No suspicious pulmonary nodule or mass within the aerated portion of either lung. Upper Abdomen: Moderate to large volume ascites seen in the visualized upper abdomen. Incomplete visualization of the kidneys suggests bilateral renal atrophy. Musculoskeletal: No worrisome lytic or sclerotic osseous abnormality. IMPRESSION: 1. Large right and moderate left pleural effusions with bibasilar lower lobe collapse/consolidative disease. 2. Patchy ground-glass opacity in  both lungs may be related to edema. 3. Moderate to large volume ascites in the visualized upper abdomen. 4. Incomplete visualization of the kidneys suggests bilateral renal atrophy. 5.  Aortic Atherosclerosis (ICD10-I70.0). Electronically Signed   By: Misty Stanley M.D.   On: 10/16/2021 17:08   DG Foot Complete Right  Result Date: 10/23/2021 CLINICAL DATA:  Pain diabetic foot ulcers EXAM: RIGHT FOOT COMPLETE - 3+ VIEW COMPARISON:  05/25/2016 FINDINGS: There is no evidence of fracture or dislocation. There is no evidence of arthropathy or other focal bone abnormality. Soft tissue wound over the medial great toe. Soft tissue edema of the forefoot. Vascular calcinosis. IMPRESSION: 1. No fracture or dislocation of the right foot. No radiographic findings of osteomyelitis. 2. Soft tissue wound over the medial great toe. Soft tissue edema of the forefoot. Electronically Signed   By: Delanna Ahmadi M.D.   On: 10/18/2021 15:48  DG Chest 2 View  Result Date: 10/27/2021 CLINICAL DATA:  Shortness of breath and cough. EXAM: CHEST - 2 VIEW COMPARISON:  Two-view chest x-ray 07/23/2021 FINDINGS: Heart is enlarged. Bilateral pleural effusions are present, right greater than left. Mild associated airspace disease is present. The upper lung fields are clear. IMPRESSION: 1. Cardiomegaly without failure. 2. Bilateral pleural effusions and associated airspace disease, likely atelectasis, right greater than left. Electronically Signed   By: San Morelle M.D.   On: 10/12/2021 14:45    Procedures Procedures  {Document cardiac monitor, telemetry assessment procedure when appropriate:1}  Medications Ordered in ED Medications  cefTRIAXone (ROCEPHIN) 2 g in sodium chloride 0.9 % 100 mL IVPB (0 g Intravenous Stopped 10/19/2021 1708)  azithromycin (ZITHROMAX) 500 mg in sodium chloride 0.9 % 250 mL IVPB (500 mg Intravenous New Bag/Given 10/07/2021 1704)  vancomycin (VANCOCIN) IVPB 1000 mg/200 mL premix (1,000 mg  Intravenous New Bag/Given 10/24/2021 1716)  sodium chloride 0.9 % bolus 1,000 mL (0 mLs Intravenous Stopped 10/24/2021 1734)  midodrine (PROAMATINE) tablet 5 mg (5 mg Oral Given 10/10/2021 1713)    ED Course/ Medical Decision Making/ A&P  Patient is septic and has a gangrenous great toe on his right foot.  He also has fluid in his lungs unlikely pneumonia and has an elevated BNP.  I spoke with nephrology Dr. Johnney Ou and she felt like the patient did not need dialysis now and could be admitted to [facility] but if the hospitalist preferred to [facility] that is understandable.   CRITICAL CARE Performed by: Milton Ferguson Total critical care time: 45 minutes Critical care time was exclusive of separately billable procedures and treating other patients. Critical care was necessary to treat or prevent imminent or life-threatening deterioration. Critical care was time spent personally by me on the following activities: development of treatment plan with patient and/or surrogate as well as nursing, discussions with consultants, evaluation of patient's response to treatment, examination of patient, obtaining history from patient or surrogate, ordering and performing treatments and interventions, ordering and review of laboratory studies, ordering and review of radiographic studies, pulse oximetry and re-evaluation of patient's condition.                          Medical Decision Making Amount and/or Complexity of Data Reviewed Labs: ordered. Radiology: ordered. ECG/medicine tests: ordered.  Risk Prescription drug management. Decision regarding hospitalization.   Patient is admitted to the hospitalist service for gangrenous foot and congestive heart failure with pleural effusions  {Document critical care time when appropriate:1} {Document review of labs and clinical decision tools ie heart score, Chads2Vasc2 etc:1}  {Document your independent review of radiology images, and any outside  records:1} {Document your discussion with family members, caretakers, and with consultants:1} {Document social determinants of health affecting pt's care:1} {Document your decision making why or why not admission, treatments were needed:1} Final Clinical Impression(s) / ED Diagnoses Final diagnoses:  Acute sepsis ([place])    Rx / DC Orders ED Discharge Orders     None
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
## 2021-10-31 NOTE — ED Triage Notes (Signed)
Pt c/o SOB x 2 weeks with np cough. Also c/o bilateral diabetic foot ulcers. States he has been unable to get an appointment with podiatrist.

## 2021-10-31 NOTE — Progress Notes (Signed)
Pharmacy Antibiotic Note  Paul Gonzalez is a 56 y.o. male admitted on 10/21/2021 with  wound/PNA .  Pharmacy has been consulted for vanc/cefepime dosing.  Pt presented with shortness of breath along with wounds on his feet. He was started on ceftriaxone/azith before abx were ordered to change to cefepime/vanc. He is ESRD (MWF).  Plan: Addition vanc '500mg'$  IV x1 to make a total of '1500mg'$  today Vanc '750mg'$  IV MWF Cefepime 2g x1 then MWF  Height: '5\' 9"'$  (175.3 cm) Weight: 77.1 kg (170 lb) IBW/kg (Calculated) : 70.7  Temp (24hrs), Avg:98.5 F (36.9 C), Min:98.5 F (36.9 C), Max:98.5 F (36.9 C)  Recent Labs  Lab 11/02/2021 1553 10/30/2021 1801  WBC 19.6*  --   CREATININE 5.14*  --   LATICACIDVEN 2.4* 2.8*    Estimated Creatinine Clearance: 16.2 mL/min (A) (by C-G formula based on SCr of 5.14 mg/dL (H)).    No Known Allergies  Antimicrobials this admission: 10/28 vanc>> 10/28 ceftriaxone x1 10/28 azith>> 10/28 cefepime>>  Dose adjustments this admission:   Microbiology results: 10/28 blood>>  Onnie Boer, PharmD, [place], AAHIVP, CPP Infectious Disease Pharmacist 10/22/2021 9:23 PM

## 2021-11-01 ENCOUNTER — Other Ambulatory Visit (HOSPITAL_COMMUNITY): Payer: Medicare Other

## 2021-11-01 ENCOUNTER — Inpatient Hospital Stay (HOSPITAL_COMMUNITY): Payer: Medicare Other

## 2021-11-01 ENCOUNTER — Other Ambulatory Visit (HOSPITAL_COMMUNITY): Payer: Self-pay | Admitting: *Deleted

## 2021-11-01 DIAGNOSIS — I5021 Acute systolic (congestive) heart failure: Secondary | ICD-10-CM

## 2021-11-01 DIAGNOSIS — I96 Gangrene, not elsewhere classified: Secondary | ICD-10-CM | POA: Diagnosis not present

## 2021-11-01 DIAGNOSIS — E11628 Type 2 diabetes mellitus with other skin complications: Secondary | ICD-10-CM | POA: Diagnosis not present

## 2021-11-01 DIAGNOSIS — J90 Pleural effusion, not elsewhere classified: Secondary | ICD-10-CM | POA: Diagnosis not present

## 2021-11-01 DIAGNOSIS — N186 End stage renal disease: Secondary | ICD-10-CM | POA: Diagnosis not present

## 2021-11-01 LAB — COMPREHENSIVE METABOLIC PANEL
ALT: 34 U/L (ref 0–44)
AST: 29 U/L (ref 15–41)
Albumin: 2.7 g/dL — ABNORMAL LOW (ref 3.5–5.0)
Alkaline Phosphatase: 92 U/L (ref 38–126)
Anion gap: 13 (ref 5–15)
BUN: 49 mg/dL — ABNORMAL HIGH (ref 6–20)
CO2: 25 mmol/L (ref 22–32)
Calcium: 9.7 mg/dL (ref 8.9–10.3)
Chloride: 96 mmol/L — ABNORMAL LOW (ref 98–111)
Creatinine, Ser: 5.27 mg/dL — ABNORMAL HIGH (ref 0.61–1.24)
GFR, Estimated: 12 mL/min — ABNORMAL LOW (ref >60)
Glucose, Bld: 156 mg/dL — ABNORMAL HIGH (ref 70–99)
Potassium: 4.9 mmol/L (ref 3.5–5.1)
Sodium: 134 mmol/L — ABNORMAL LOW (ref 135–145)
Total Bilirubin: 1.1 mg/dL (ref 0.3–1.2)
Total Protein: 6.2 g/dL — ABNORMAL LOW (ref 6.5–8.1)

## 2021-11-01 LAB — HEMOGLOBIN A1C
Hgb A1c MFr Bld: 6.4 % — ABNORMAL HIGH (ref 4.8–5.6)
Mean Plasma Glucose: 136.98 mg/dL

## 2021-11-01 LAB — CBC
HCT: 35.3 % — ABNORMAL LOW (ref 39.0–52.0)
Hemoglobin: 11.3 g/dL — ABNORMAL LOW (ref 13.0–17.0)
MCH: 28.4 pg (ref 26.0–34.0)
MCHC: 32 g/dL (ref 30.0–36.0)
MCV: 88.7 fL (ref 80.0–100.0)
Platelets: 159 10*3/uL (ref 150–400)
RBC: 3.98 MIL/uL — ABNORMAL LOW (ref 4.22–5.81)
RDW: 20.7 % — ABNORMAL HIGH (ref 11.5–15.5)
WBC: 16 10*3/uL — ABNORMAL HIGH (ref 4.0–10.5)
nRBC: 0 % (ref 0.0–0.2)

## 2021-11-01 LAB — PHOSPHORUS: Phosphorus: 7 mg/dL — ABNORMAL HIGH (ref 2.5–4.6)

## 2021-11-01 LAB — ECHOCARDIOGRAM COMPLETE
AR max vel: 0.55 cm2
AV Area VTI: 0.54 cm2
AV Area mean vel: 0.64 cm2
AV Mean grad: 36 mmHg
AV Peak grad: 59.9 mmHg
Ao pk vel: 3.87 m/s
Area-P 1/2: 6.17 cm2
Calc EF: 36.7 %
Height: 69 in
MV M vel: 4.5 m/s
MV Peak grad: 81 mmHg
P 1/2 time: 160 msec
S' Lateral: 4.7 cm
Single Plane A2C EF: 34.5 %
Single Plane A4C EF: 37 %
Weight: 2720 oz

## 2021-11-01 LAB — MAGNESIUM: Magnesium: 2.1 mg/dL (ref 1.7–2.4)

## 2021-11-01 LAB — CBG MONITORING, ED
Glucose-Capillary: 168 mg/dL — ABNORMAL HIGH (ref 70–99)
Glucose-Capillary: 204 mg/dL — ABNORMAL HIGH (ref 70–99)
Glucose-Capillary: 260 mg/dL — ABNORMAL HIGH (ref 70–99)
Glucose-Capillary: 172 mg/dL — ABNORMAL HIGH (ref 70–99)

## 2021-11-01 MED ORDER — LACTATED RINGERS IV BOLUS: 1000.0000 mL | Freq: Once | INTRAVENOUS | Status: DC

## 2021-11-01 MED ORDER — CHLORHEXIDINE GLUCONATE CLOTH 2 % EX PADS
6.0000 | MEDICATED_PAD | Freq: Every day | CUTANEOUS | Status: DC
  Administered 2021-11-03 – 2021-11-08 (×3): 6 via TOPICAL

## 2021-11-01 MED ORDER — MORPHINE SULFATE (PF) 2 MG/ML IV SOLN
2.0000 mg | INTRAVENOUS | Status: DC | PRN
  Administered 2021-11-01: 2 mg via INTRAVENOUS
  Filled 2021-11-01: qty 1

## 2021-11-01 MED ORDER — OXYCODONE HCL 5 MG PO TABS
5.0000 mg | ORAL_TABLET | Freq: Four times a day (QID) | ORAL | Status: DC | PRN
  Administered 2021-11-02 – 2021-11-11 (×8): 5 mg via ORAL
  Filled 2021-11-01 (×8): qty 1

## 2021-11-01 MED ORDER — MIDODRINE HCL 5 MG PO TABS: 10.0000 mg | ORAL_TABLET | ORAL | Status: DC

## 2021-11-01 MED ORDER — HYDROMORPHONE HCL 1 MG/ML IJ SOLN
0.5000 mg | INTRAMUSCULAR | Status: DC | PRN
  Administered 2021-11-08 – 2021-11-09 (×2): 0.5 mg via INTRAVENOUS
  Filled 2021-11-01 (×2): qty 0.5

## 2021-11-01 NOTE — Progress Notes (Signed)
PROGRESS NOTE   Paul Gonzalez  GYJ:[id number] DOB: December 23, 1965 DOA: 10/27/2021 PCP: Coral Spikes, DO   Chief Complaint  Patient presents with   Shortness of Breath   Level of care: Telemetry Medical  Brief Admission History:  56 y.o. male with medical history significant of Hypertension, hyperlipidemia, ESRD on HD (MWF), anemia of chronic disease, diabetic retinopathy with vision loss who presents to the emergency department due to worsening shortness of breath that has been ongoing for about 2 weeks, this was associated with nonproductive cough.  He  also complained of bilateral diabetic foot ulcers and states that he has not been able to get an appointment with a podiatrist.  He denies chest pain, fever, chills, nausea, vomiting, diarrhea or constipation.   ED Course:  In the emergency department, he was intermittently tachypneic, BP was soft at 105/52, other vital signs were within normal range.  Work-up in the ED showed leukocytosis, normocytic anemia, BMP showed sodium 135, potassium 4.9, chloride 93, bicarb 25, glucose 173, BUN 45, creatinine 5.14, albumin 3.3, AST 57, ALT 47, total bilirubin 1.6.  Anion gap 17, lactic acid 2.4 > 2.8, BNP > 4,500.  Influenza A, B, SARS coronavirus 2 was negative.  Right foot x-ray showed no fracture or dislocation of the right foot.  No radiographic findings of osteomyelitis.  Soft tissue wound over the medial great toe.  Soft tissue edema of the forefoot.  Chest x-ray showed cardiomegaly without failure.  Bilateral pleural effusion and associated airspace disease, likely atelectasis, right greater than left.  He was treated with IV ceftriaxone, vancomycin and azithromycin.  Midodrine was given.  IV hydration was provided.  Hospitalist was asked to admit patient for further evaluation and management.  Orthopedic surgeon (Dr. Erlinda Hong) was consulted and patient will be seen when he arrives at [facility].   Assessment and Plan:  Sepsis secondary to gangrene  right toe with cellulitis - orthopedics consulted Dr Erlinda Hong who will see patient at [facility] - pt will require amputation of gangrenous tissue - continue broad spectrum antibiotics   Volume overload - fluid management with hemodialysis   ESRD on HD - He is on schedule with MWF HD treatments - nephrology was consulted regarding inpatient HD treatments - electrolytes are stable at this time  - notify nephrology team when he arrives at [facility]  Lactic acidosis Lactate down to 2.1 now  Bilateral Pleural Effusions R>L - thoracentesis ordered for large right effusion  - continue supportive care and volume management with HD for now - not currently with any symptoms of respiratory distress   Leukocytosis  - monitor CBC/diff as response to treating infection - follow blood cultures   Diabetes mellitus with neurological, renal, vascular complications - continue SSI coverage, daily lantus 8 units for basal coverage, frequent CBG monitoring  Chronic Hypotension  - we have resumed his home midodrine   DVT prophylaxis: [place] heparin  Code Status: Full  Family Communication: wife bedside 10/29 Disposition: Status is: Inpatient Remains inpatient appropriate because: IV antibiotics, intensity   Consultants:  Orthopedics Dr Erlinda Hong  Procedures:   Antimicrobials:  Vanc, cefepime  Subjective: Pt wanting to eat something.  Pain is controlled.    Objective: Vitals:   11/01/21 0920 11/01/21 1106 11/01/21 1107 11/01/21 1108  BP:  (!) 107/47    Pulse:  85  85  Resp:  (!) 23 (!) 24 (!) 22  Temp: 98.3 F (36.8 C)   98.5 F (36.9 C)  TempSrc: Oral   Oral  SpO2:  98%  100%  Weight:      Height:        Intake/Output Summary (Last 24 hours) at 11/01/2021 1231 Last data filed at 11/01/2021 0152 Gross per 24 hour  Intake 200 ml  Output --  Net 200 ml   Filed Weights   10/05/2021 1423  Weight: 77.1 kg   Examination:  General exam: Appears calm and comfortable appears chronically ill.  Respiratory  system: diminished BS RLL  Cardiovascular system: normal S1 & S2 heard. No JVD, murmurs, rubs, gallops or clicks. No pedal edema. Gastrointestinal system: Abdomen is nondistended, soft and nontender. No organomegaly or masses felt. Normal bowel sounds heard. Central nervous system: Alert and oriented. No focal neurological deficits. 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  Skin: bilateral foot ulceration lesions, discoloration/gangrene seen Psychiatry: Judgement and insight appear poor. Mood & affect flat.   Data Reviewed: I have personally reviewed following labs and imaging studies  CBC: Recent Labs  Lab 10/30/2021 1553 11/01/21 0525  WBC 19.6* 16.0*  NEUTROABS 17.6*  --   HGB 12.6* 11.3*  HCT 40.1 35.3*  MCV 91.1 88.7  PLT 185 854    Basic Metabolic Panel: Recent Labs  Lab 10/29/2021 1553 11/01/21 0525  NA 135 134*  K 4.9 4.9  CL 93* 96*  CO2 25 25  GLUCOSE 173* 156*  BUN 45* 49*  CREATININE 5.14* 5.27*  CALCIUM 10.0 9.7  MG  --  2.1  PHOS  --  7.0*    CBG: Recent Labs  Lab 10/12/2021 2143 11/01/21 0715 11/01/21 1119  GLUCAP 199* 168* 204*    Recent Results (from the past 240 hour(s))  Resp Panel by RT-PCR (Flu A&B, Covid) Anterior Nasal Swab     Status: None   Collection Time: 10/30/2021  3:22 PM   Specimen: Anterior Nasal Swab  Result Value Ref Range Status   SARS Coronavirus 2 by RT PCR NEGATIVE NEGATIVE Final    Comment: (NOTE) SARS-CoV-2 target nucleic acids are NOT DETECTED.  The SARS-CoV-2 RNA is generally detectable in upper respiratory specimens during the acute phase of infection. The lowest concentration of SARS-CoV-2 viral copies this assay can detect is 138 copies/mL. A negative result does not preclude SARS-Cov-2 infection and should not be used as the sole basis for treatment or other patient management decisions. A negative result may occur with  improper specimen collection/handling, submission of  specimen other than nasopharyngeal swab, presence of viral mutation(s) within the areas targeted by this assay, and inadequate number of viral copies(<138 copies/mL). A negative result must be combined with clinical observations, patient history, and epidemiological information. The expected result is Negative.  Fact Sheet for Patients:  EntrepreneurPulse.com.au  Fact Sheet for Healthcare Providers:  IncredibleEmployment.be  This test is no t yet approved or cleared by the [place] FDA and  has been authorized for detection and/or diagnosis of SARS-CoV-2 by FDA under an Emergency Use Authorization (EUA). This EUA will remain  in effect (meaning this test can be used) for the duration of the COVID-19 declaration under Section 564(b)(1) of the Act, 21 U.S.C.section 360bbb-3(b)(1), unless the authorization is terminated  or revoked sooner.       Influenza A by PCR NEGATIVE NEGATIVE Final   Influenza B by PCR NEGATIVE NEGATIVE Final    Comment: (NOTE) The Xpert Xpress SARS-CoV-2/FLU/RSV plus assay is intended as an aid in the diagnosis of influenza from Nasopharyngeal swab specimens and should not be used  as a sole basis for treatment. Nasal washings and aspirates are unacceptable for Xpert Xpress SARS-CoV-2/FLU/RSV testing.  Fact Sheet for Patients: EntrepreneurPulse.com.au  Fact Sheet for Healthcare Providers: IncredibleEmployment.be  This test is not yet approved or cleared by the [place] FDA and has been authorized for detection and/or diagnosis of SARS-CoV-2 by FDA under an Emergency Use Authorization (EUA). This EUA will remain in effect (meaning this test can be used) for the duration of the COVID-19 declaration under Section 564(b)(1) of the Act, 21 U.S.C. section 360bbb-3(b)(1), unless the authorization is terminated or revoked.  Performed at [facility], [address],  [place], [place] [address]   Blood Culture (routine x 2)     Status: None (Preliminary result)   Collection Time: 10/19/2021  3:53 PM   Specimen: Blood  Result Value Ref Range Status   Specimen Description BLOOD RIGHT ARM  Final   Special Requests   Final    BOTTLES DRAWN AEROBIC AND ANAEROBIC Blood Culture adequate volume   Culture   Final    NO GROWTH < 12 HOURS Performed at [facility], [address], [place], [place] [address]    Report Status PENDING  Incomplete  Blood Culture (routine x 2)     Status: None (Preliminary result)   Collection Time: 10/23/2021  3:54 PM   Specimen: Blood  Result Value Ref Range Status   Specimen Description RIGHT ANTECUBITAL  Final   Special Requests   Final    BOTTLES DRAWN AEROBIC AND ANAEROBIC Blood Culture results may not be optimal due to an excessive volume of blood received in culture bottles   Culture   Final    NO GROWTH < 12 HOURS Performed at [facility], [address] [place], [place] [address]    Report Status PENDING  Incomplete     Radiology Studies: CT Chest Wo Contrast  Result Date: 10/14/2021 CLINICAL DATA:  Cough and shortness of breath. EXAM: CT CHEST WITHOUT CONTRAST TECHNIQUE: Multidetector CT imaging of the chest was performed following the standard protocol without IV contrast. RADIATION DOSE REDUCTION: This exam was performed according to the departmental dose-optimization program which includes automated exposure control, adjustment of the mA and/or kV according to patient size and/or use of iterative reconstruction technique. COMPARISON:  CT chest 03/02/2013 FINDINGS: Cardiovascular: Heart is borderline enlarged to mildly increased. No pericardial effusion. Coronary artery calcification is evident. Aortic valve calcification evident. Mild atherosclerotic calcification is noted in the wall of the thoracic aorta. Mediastinum/Nodes: Scattered small to upper normal mediastinal lymph nodes evident including 10 mm short axis precarinal  node on 56/2. No evidence for gross hilar lymphadenopathy although assessment is limited by the lack of intravenous contrast on the current study. The esophagus has normal imaging features. Upper normal left axillary and subpectoral lymphadenopathy evident. Lungs/Pleura: Large right and moderate left pleural effusions noted with bibasilar lower lobe collapse/consolidative disease. Patchy ground-glass opacity in both lungs may be related to edema. No suspicious pulmonary nodule or mass within the aerated portion of either lung. Upper Abdomen: Moderate to large volume ascites seen in the visualized upper abdomen. Incomplete visualization of the kidneys suggests bilateral renal atrophy. Musculoskeletal: No worrisome lytic or sclerotic osseous abnormality. IMPRESSION: 1. Large right and moderate left pleural effusions with bibasilar lower lobe collapse/consolidative disease. 2. Patchy ground-glass opacity in both lungs may be related to edema. 3. Moderate to large volume ascites in the visualized upper abdomen. 4. Incomplete visualization of the kidneys suggests bilateral renal atrophy. 5.  Aortic  Atherosclerosis (ICD10-I70.0). Electronically Signed   By: Misty Stanley M.D.   On: 10/07/2021 17:08   DG Foot Complete Right  Result Date: 10/20/2021 CLINICAL DATA:  Pain diabetic foot ulcers EXAM: RIGHT FOOT COMPLETE - 3+ VIEW COMPARISON:  05/25/2016 FINDINGS: There is no evidence of fracture or dislocation. There is no evidence of arthropathy or other focal bone abnormality. Soft tissue wound over the medial great toe. Soft tissue edema of the forefoot. Vascular calcinosis. IMPRESSION: 1. No fracture or dislocation of the right foot. No radiographic findings of osteomyelitis. 2. Soft tissue wound over the medial great toe. Soft tissue edema of the forefoot. Electronically Signed   By: Delanna Ahmadi M.D.   On: 10/07/2021 15:48   DG Chest 2 View  Result Date: 10/13/2021 CLINICAL DATA:  Shortness of breath and cough.  EXAM: CHEST - 2 VIEW COMPARISON:  Two-view chest x-ray 07/23/2021 FINDINGS: Heart is enlarged. Bilateral pleural effusions are present, right greater than left. Mild associated airspace disease is present. The upper lung fields are clear. IMPRESSION: 1. Cardiomegaly without failure. 2. Bilateral pleural effusions and associated airspace disease, likely atelectasis, right greater than left. Electronically Signed   By: San Morelle M.D.   On: 10/13/2021 14:45    Scheduled Meds:  atorvastatin  20 mg Oral Daily   calcium acetate  667 mg Oral TID WC   feeding supplement (GLUCERNA SHAKE)  237 mL Oral TID BM   ferrous sulfate  325 mg Oral Q breakfast   heparin  5,000 Units Subcutaneous Q8H   insulin aspart  0-5 Units Subcutaneous QHS   insulin aspart  0-6 Units Subcutaneous TID WC   insulin glargine-yfgn  8 Units Subcutaneous QHS   midodrine  5 mg Oral Daily   pantoprazole  40 mg Oral Daily   Continuous Infusions:  azithromycin Stopped (10/11/2021 1818)   [START ON 11/02/2021] ceFEPime (MAXIPIME) IV     [START ON 11/02/2021] vancomycin      LOS: 1 day   Time spent: 35 mins  Romonda Parker Wynetta Emery, MD How to contact the [facility] Attending or Consulting provider [place] or covering provider during after hours [place], for this patient?  Check the care team in [facility] and look for a) attending/consulting TR[facility] provider listed and b) the [facility] team listed Log into [email or web address] and use [place]'s universal password to access. If you do not have the password, please contact the hospital operator. Locate the [facility] provider you are looking for under [facility] and page to a number that you can be directly reached. If you still have difficulty reaching the provider, please page the [facility] (Director on Call) for the Hospitalists listed on amion for assistance.  11/01/2021, 12:31 PM

## 2021-11-01 NOTE — Hospital Course (Signed)
56 y.o. male with medical history significant of Hypertension, hyperlipidemia, ESRD on HD (MWF), anemia of chronic disease, diabetic retinopathy with vision loss who presents to the emergency department due to worsening shortness of breath that has been ongoing for about 2 weeks, this was associated with nonproductive cough.  He  also complained of bilateral diabetic foot ulcers and states that he has not been able to get an appointment with a podiatrist.  He denies chest pain, fever, chills, nausea, vomiting, diarrhea or constipation.   ED Course:  In the emergency department, he was intermittently tachypneic, BP was soft at 105/52, other vital signs were within normal range.  Work-up in the ED showed leukocytosis, normocytic anemia, BMP showed sodium 135, potassium 4.9, chloride 93, bicarb 25, glucose 173, BUN 45, creatinine 5.14, albumin 3.3, AST 57, ALT 47, total bilirubin 1.6.  Anion gap 17, lactic acid 2.4 > 2.8, BNP > 4,500.  Influenza A, B, SARS coronavirus 2 was negative.  Right foot x-ray showed no fracture or dislocation of the right foot.  No radiographic findings of osteomyelitis.  Soft tissue wound over the medial great toe.  Soft tissue edema of the forefoot.  Chest x-ray showed cardiomegaly without failure.  Bilateral pleural effusion and associated airspace disease, likely atelectasis, right greater than left.  He was treated with IV ceftriaxone, vancomycin and azithromycin.  Midodrine was given.  IV hydration was provided.  Hospitalist was asked to admit patient for further evaluation and management.  Orthopedic surgeon (Dr. Erlinda Hong) was consulted and patient will be seen when he arrives at [facility].

## 2021-11-01 NOTE — ED Notes (Signed)
Breakfast tray given to pt 

## 2021-11-01 NOTE — ED Notes (Signed)
Unable to collect UA & urine culture. Pt states "no, very little" to voiding urine.
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
## 2021-11-01 NOTE — ED Notes (Signed)
Lunch tray given to pt.

## 2021-11-01 NOTE — TOC Progression Note (Signed)
  Transition of Care [facility]) Screening Note   Patient Details  Name: Paul Gonzalez Date of Birth: 02/23/1965   Transition of Care [facility]) CM/SW Contact:    Boneta Lucks, RN Phone Number: 11/01/2021, 9:58 AM  Transfer to [facility]  Transition of Care Department [facility]) has reviewed patient and no TOC needs have been identified at this time. We will continue to monitor patient advancement through interdisciplinary progression rounds. If new patient transition needs arise, please place a TOC consult.

## 2021-11-01 NOTE — Progress Notes (Signed)
*  PRELIMINARY RESULTS* Echocardiogram 2D Echocardiogram has been performed.  Paul Gonzalez 11/01/2021, 10:42 AM

## 2021-11-01 NOTE — ED Notes (Signed)
Patient refused Heparin, medication signed off but not given.

## 2021-11-02 ENCOUNTER — Encounter (HOSPITAL_COMMUNITY): Payer: Self-pay | Admitting: Internal Medicine

## 2021-11-02 ENCOUNTER — Inpatient Hospital Stay (HOSPITAL_COMMUNITY): Payer: Medicare Other

## 2021-11-02 DIAGNOSIS — A419 Sepsis, unspecified organism: Principal | ICD-10-CM | POA: Insufficient documentation

## 2021-11-02 DIAGNOSIS — I352 Nonrheumatic aortic (valve) stenosis with insufficiency: Secondary | ICD-10-CM

## 2021-11-02 DIAGNOSIS — I5041 Acute combined systolic (congestive) and diastolic (congestive) heart failure: Secondary | ICD-10-CM

## 2021-11-02 DIAGNOSIS — E11628 Type 2 diabetes mellitus with other skin complications: Secondary | ICD-10-CM | POA: Diagnosis not present

## 2021-11-02 DIAGNOSIS — N186 End stage renal disease: Secondary | ICD-10-CM | POA: Diagnosis not present

## 2021-11-02 DIAGNOSIS — R7881 Bacteremia: Secondary | ICD-10-CM

## 2021-11-02 DIAGNOSIS — J90 Pleural effusion, not elsewhere classified: Secondary | ICD-10-CM | POA: Diagnosis not present

## 2021-11-02 LAB — LACTATE DEHYDROGENASE, PLEURAL OR PERITONEAL FLUID: LD, Fluid: 44 U/L — ABNORMAL HIGH (ref 3–23)

## 2021-11-02 LAB — BLOOD CULTURE ID PANEL (REFLEXED) - BCID2

## 2021-11-02 LAB — CBC WITH DIFFERENTIAL/PLATELET
Abs Immature Granulocytes: 0.07 10*3/uL (ref 0.00–0.07)
Basophils Absolute: 0.1 10*3/uL (ref 0.0–0.1)
Basophils Relative: 0 %
Eosinophils Absolute: 0.3 10*3/uL (ref 0.0–0.5)
Eosinophils Relative: 2 %
HCT: 34.7 % — ABNORMAL LOW (ref 39.0–52.0)
Hemoglobin: 11.1 g/dL — ABNORMAL LOW (ref 13.0–17.0)
Immature Granulocytes: 0 %
Lymphocytes Relative: 3 %
Lymphs Abs: 0.5 10*3/uL — ABNORMAL LOW (ref 0.7–4.0)
MCH: 28.5 pg (ref 26.0–34.0)
MCHC: 32 g/dL (ref 30.0–36.0)
MCV: 89.2 fL (ref 80.0–100.0)
Monocytes Absolute: 1.2 10*3/uL — ABNORMAL HIGH (ref 0.1–1.0)
Monocytes Relative: 8 %
Neutro Abs: 13.7 10*3/uL — ABNORMAL HIGH (ref 1.7–7.7)
Neutrophils Relative %: 87 %
Platelets: 155 10*3/uL (ref 150–400)
RBC: 3.89 MIL/uL — ABNORMAL LOW (ref 4.22–5.81)
RDW: 20.6 % — ABNORMAL HIGH (ref 11.5–15.5)
WBC: 15.8 10*3/uL — ABNORMAL HIGH (ref 4.0–10.5)
nRBC: 0 % (ref 0.0–0.2)

## 2021-11-02 LAB — RENAL FUNCTION PANEL
Albumin: 2.6 g/dL — ABNORMAL LOW (ref 3.5–5.0)
Anion gap: 13 (ref 5–15)
BUN: 63 mg/dL — ABNORMAL HIGH (ref 6–20)
CO2: 25 mmol/L (ref 22–32)
Calcium: 9.4 mg/dL (ref 8.9–10.3)
Chloride: 96 mmol/L — ABNORMAL LOW (ref 98–111)
Creatinine, Ser: 6.21 mg/dL — ABNORMAL HIGH (ref 0.61–1.24)
GFR, Estimated: 10 mL/min — ABNORMAL LOW (ref >60)
Glucose, Bld: 174 mg/dL — ABNORMAL HIGH (ref 70–99)
Phosphorus: 7.5 mg/dL — ABNORMAL HIGH (ref 2.5–4.6)
Potassium: 5 mmol/L (ref 3.5–5.1)
Sodium: 134 mmol/L — ABNORMAL LOW (ref 135–145)

## 2021-11-02 LAB — GLUCOSE, PLEURAL OR PERITONEAL FLUID: Glucose, Fluid: 182 mg/dL

## 2021-11-02 LAB — PROTEIN, PLEURAL OR PERITONEAL FLUID: Total protein, fluid: <3 g/dL

## 2021-11-02 LAB — BODY FLUID CELL COUNT WITH DIFFERENTIAL
Eos, Fluid: 0 %
Lymphs, Fluid: 11 %
Monocyte-Macrophage-Serous Fluid: 68 % (ref 50–90)
Neutrophil Count, Fluid: 28 % — ABNORMAL HIGH (ref 0–25)
Total Nucleated Cell Count, Fluid: 86 cu mm (ref 0–1000)

## 2021-11-02 LAB — GRAM STAIN: Gram Stain: NONE SEEN

## 2021-11-02 LAB — CBG MONITORING, ED
Glucose-Capillary: 164 mg/dL — ABNORMAL HIGH (ref 70–99)
Glucose-Capillary: 174 mg/dL — ABNORMAL HIGH (ref 70–99)
Glucose-Capillary: 171 mg/dL — ABNORMAL HIGH (ref 70–99)
Glucose-Capillary: 158 mg/dL — ABNORMAL HIGH (ref 70–99)

## 2021-11-02 LAB — HEPATITIS B SURFACE ANTIGEN: Hepatitis B Surface Ag: NONREACTIVE

## 2021-11-02 MED ORDER — PENTAFLUOROPROP-TETRAFLUOROETH EX AERO: 1.0000 | INHALATION_SPRAY | CUTANEOUS | Status: DC | PRN

## 2021-11-02 MED ORDER — CALCITRIOL 0.25 MCG PO CAPS
0.2500 ug | ORAL_CAPSULE | ORAL | Status: DC
  Administered 2021-11-02 – 2021-11-11 (×4): 0.25 ug via ORAL
  Filled 2021-11-02 (×3): qty 1

## 2021-11-02 MED ORDER — PIPERACILLIN-TAZOBACTAM IN DEX 2-0.25 GM/50ML IV SOLN
2.2500 g | Freq: Three times a day (TID) | INTRAVENOUS | Status: DC
  Filled 2021-11-02 (×4): qty 50

## 2021-11-02 MED ORDER — LIDOCAINE-PRILOCAINE 2.5-2.5 % EX CREA: 1.0000 | TOPICAL_CREAM | CUTANEOUS | Status: DC | PRN

## 2021-11-02 MED ORDER — ALBUMIN HUMAN 25 % IV SOLN
25.0000 g | Freq: Once | INTRAVENOUS | Status: AC
  Administered 2021-11-02: 25 g via INTRAVENOUS

## 2021-11-02 MED ORDER — ALTEPLASE 2 MG IJ SOLR: 2.0000 mg | Freq: Once | INTRAMUSCULAR | Status: DC | PRN

## 2021-11-02 MED ORDER — MIDODRINE HCL 5 MG PO TABS
10.0000 mg | ORAL_TABLET | Freq: Three times a day (TID) | ORAL | Status: DC
  Administered 2021-11-02 – 2021-11-05 (×8): 10 mg via ORAL
  Filled 2021-11-02 (×9): qty 2

## 2021-11-02 MED ORDER — SODIUM CHLORIDE 0.9 % IV SOLN
2.2500 g | Freq: Three times a day (TID) | INTRAVENOUS | Status: DC
  Administered 2021-11-02 – 2021-11-03 (×4): 2.25 g via INTRAVENOUS
  Filled 2021-11-02 (×9): qty 10

## 2021-11-02 MED ORDER — ANTICOAGULANT SODIUM CITRATE 4% (200MG/5ML) IV SOLN: 5.0000 mL | Status: DC | PRN

## 2021-11-02 MED ORDER — HEPARIN SODIUM (PORCINE) 1000 UNIT/ML DIALYSIS: 1000.0000 [IU] | INTRAMUSCULAR | Status: DC | PRN

## 2021-11-02 MED ORDER — LIDOCAINE HCL (PF) 1 % IJ SOLN: 5.0000 mL | INTRAMUSCULAR | Status: DC | PRN

## 2021-11-02 NOTE — ED Notes (Signed)
Pt transported to Dialysis ?

## 2021-11-02 NOTE — Consult Note (Addendum)
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 pressure improves can consider IV Lasix. - Patient may eventually need a heart cath to evaluate for ischemia and the aortic valve, but defer at this time given sepsis.   Bilateral pleural effusions - plan for thoracentesis -Continue volume management with HD  Aortic valve disease -Echo this admission showed LVEF 25 to 30%, moderate to severe AI, and moderate to severe AS, gradient of 36 mmHg. -We will likely need TEE at some point  Sepsis secondary to gangrene right toe with cellulitis Lactic acidosis -Lactic acidosis improving, down to 2.1 -Antibiotics per primary team -will likely need amputation at [facility]  ESRD on HD -He is on schedule Monday Wednesday Friday -Nephrology is following  For questions or updates, please contact Cloud Please consult [email or web address] for contact  info under    Signed, Cadence Ninfa Meeker, PA-C  11/02/2021 7:44 AM    Attending note:  Patient seen and examined.  I reviewed extensive records and discussed the case with Ms. Jorene Minors, agree with her above findings.  Paul Gonzalez presents to the [facility] ER complaining of acute on chronic shortness of breath with progressive symptoms over a period of months, nonproductive cough, also progressive diabetic foot ulcers.  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  Blood cultures positive for Staphylococcus aureus and also Bacteroides fragilis.  States that he has been compliant with hemodialysis  sessions.  He is pending transfer to [facility] for further management with consultations including orthopedics, nephrology, and cardiology.  He has a history of MSSA bacteremia back in March of this year, TEE at that time by Dr. Harl Bowie indicated bulky calcification of the left coronary and noncoronary aortic cusps with similar appearance to prior studies.  Transthoracic echocardiogram at that time indicated moderate to severe aortic stenosis with mean gradient 30 mmHg and dimensionless index 0.28.  No aortic regurgitation reported at that time.  He was seen in August of this year by Dr. Irish Lack for evaluation of aortic stenosis with plan for reimaging in 6 months to a year.  On examination patient is in no distress, mildly short of breath at rest.  He is currently afebrile, heart rate in the 80s in sinus rhythm by telemetry which I personally reviewed, blood pressure 98/55.  Lungs exhibit diminished breath sounds mid to lower lung zones bilaterally with egophony midlung zones.  Cardiac exam with RRR and 3/6 basal systolic murmur, 1/6 to 2/6 diastolic murmur, no gallop.  Abdomen nontender.  He has gangrenous changes of the right great toe and other diabetic  foot ulcerative changes.  Pertinent lab work includes potassium 5.0, BUN 63, creatinine 6.21, WBC 15.8, hemoglobin 11.1, platelets 155, glucose 174.  Chest CT shows large right-sided and moderate left-sided pleural effusions with lower lobe collapse and consolidative disease, patchy groundglass opacities in both lung fields, moderate to large point ascites.  I personally reviewed his ECG from 10/21/2021 which shows sinus rhythm with IVCD/LVH and poor R wave progression rule out anterolateral infarct pattern.  PR interval similar to prior tracing from 2019.  Follow-up echocardiogram yesterday shows newly documented cardiomyopathy with LVEF 25 to 30% and restrictive diastolic filling pattern, severely dilated right ventricle with moderately elevated RVSP, severe left atrial enlargement, small pericardial effusion, moderate mitral annular calcification with mild mitral stenosis and mild mitral regurgitation, and moderate to severe aortic regurgitation as well as moderate to severe aortic stenosis.  Mean gradient 36 mmHg and dimensionless index 0.20.  No description of valvular vegetations.  Complex situation, patient awaits transfer to [facility].  He has sepsis and right great toe cellulitis with gangrene and plan for amputation per review of the notes.  He is on broad-spectrum antibiotics at this time.  Has bacteremia with Staphylococcus aureus and also Bacteroides fragilis.  Undergoing thoracentesis today prior to transfer to address pleural effusions and has also been seen by nephrology for ongoing HD sessions.  With progressive aortic valve disease in particular new moderate to severe aortic regurgitation, he should undergo a TEE to better exclude endocarditis.  Whether or not his cardiomyopathy is secondary to progressive aortic valve disease is unclear but certainly possible since his last imaging was back in March.  Would not pursue invasive cardiac work-up at this time in light of active  bacteremia.  His general surgical risk is high based on active comorbidities and this is largely an unmodifiable risk in the short-term.  Bigger question will be whether he needs to be considered for surgical management of his aortic valve particularly if he does have evidence of endocarditis, may need TCTS consultation depending on TEE results.  Our cardiology service will continue to follow him at [facility]  Satira Sark, M.D., F.A.C.C.

## 2021-11-02 NOTE — ED Notes (Signed)
Patient of floor to dialysis

## 2021-11-02 NOTE — Progress Notes (Signed)
Subjective:  Still in ER at [facility]-  had a thoracentesis of 1500 ccs on the right-  feels better- due for HD today and plan is still for him to transfer to [facility] for amputation of a gangrenous toe  Objective Vital signs in last 24 hours: Vitals:   11/02/21 0000 11/02/21 0423 11/02/21 0425 11/02/21 0622  BP: (!) 102/50 102/66    Pulse: 80 85 85   Resp: '19 18 12   '$ Temp:    98.6 F (37 C)  TempSrc:    Oral  SpO2: 100% 95% 95%   Weight:      Height:       Weight change:   Intake/Output Summary (Last 24 hours) at 11/02/2021 0830 Last data filed at 11/01/2021 1715 Gross per 24 hour  Intake 250 ml  Output --  Net 250 ml    [facility]- MWF  3:45, 77 kg ( left at 78 on Friday) ,  L AVF , 15 gauge 400 BFR/500 DFR, 2/2.5 bath, no heparin, calcitriol 0.25 TIW, mircera 150 ( due 10/30), venofer 50 ( due 10/30)   Assessment/Plan:   # ESRD  - MWF schedule at [facility] usually  - HD today - Note that his EDW needs to be lowered to try to prevent fluid from re-accumulating-  may be difficult due to BP-  will use albumin here and increase midodrine to attempt more UF    # Bilateral pleural effusions  - s/p thoracentesis on right-  removed 1500 - optimize volume with HD to prevent re-accumulation    # Gangrenous wound of right great toe with bacteremia  - Ortho has been consulted  - he is to be transferred to [facility]  -Put on vanc and cefepime    # Chronic hypotension - limits UF with HD - continue midodrine - will inc to 10 TID   # Anemia CKD - Hb 11.3 - acceptable-  no meds for now    # Metabolic bone disease  -  continue calcitriol TIW - looks like on phoslo as OP-  will add    Disposition - patient is to be transferred to [facility] for ortho; we will follow him there      [place]: Basic Metabolic Panel: Recent Labs  Lab 10/17/2021 1553 11/01/21 0525 11/02/21 0604  NA 135 134* 134*  K 4.9 4.9 5.0  CL 93* 96* 96*  CO2 '25 25 25  '$ GLUCOSE  173* 156* 174*  BUN 45* 49* 63*  CREATININE 5.14* 5.27* 6.21*  CALCIUM 10.0 9.7 9.4  PHOS  --  7.0* 7.5*   Liver Function Tests: Recent Labs  Lab 10/28/2021 1553 11/01/21 0525 11/02/21 0604  AST 57* 29  --   ALT 47* 34  --   ALKPHOS 112 92  --   BILITOT 1.6* 1.1  --   PROT 7.4 6.2*  --   ALBUMIN 3.3* 2.7* 2.6*   No results for input(s): "LIPASE", "AMYLASE" in the last 168 hours. No results for input(s): "AMMONIA" in the last 168 hours. CBC: Recent Labs  Lab 10/08/2021 1553 11/01/21 0525 11/02/21 0604  WBC 19.6* 16.0* 15.8*  NEUTROABS 17.6*  --  13.7*  HGB 12.6* 11.3* 11.1*  HCT 40.1 35.3* 34.7*  MCV 91.1 88.7 89.2  PLT 185 159 155   Cardiac Enzymes: No results for input(s): "CKTOTAL", "CKMB", "CKMBINDEX", "TROPONINI" in the last 168 hours. CBG: Recent Labs  Lab 11/01/21 1119 11/01/21 1650 11/01/21 2247 11/02/21  0536 11/02/21 0744  GLUCAP 204* 260* 172* 164* 174*    Iron Studies: No results for input(s): "IRON", "TIBC", "TRANSFERRIN", "FERRITIN" in the last 72 hours. Studies/Results: ECHOCARDIOGRAM COMPLETE  Result Date: 11/01/2021    ECHOCARDIOGRAM REPORT   Patient Name:   Paul Gonzalez Date of Exam: 11/01/2021 Medical Rec #:  [id number]       Height:       69.0 in Accession #:    [id number]      Weight:       170.0 lb Date of Birth:  1965-01-05       BSA:          1.928 m Patient Age:    56 years        BP:           98/56 mmHg Patient Gender: M               HR:           88 bpm. Exam Location:  [facility] Procedure: 2D Echo, Cardiac Doppler and Color Doppler Indications:    CHF-Acute Systolic G92.11  History:        Patient has prior history of Echocardiogram examinations, most                 recent 03/19/2021. Risk Factors:Hypertension, Diabetes and                 Dyslipidemia. ESRD (end stage renal disease) on dialysis.                 Elevated brain natriuretic peptide (BNP) level. Pleural                 effusion, bilateral.  Sonographer:    Alvino Chapel RCS  Referring Phys: [id number] OLADAPO ADEFESO IMPRESSIONS  1. Left ventricular ejection fraction, by estimation, is 25 to 30%. The left ventricle has severely decreased function. The left ventricle demonstrates global hypokinesis. Left ventricular diastolic parameters are consistent with Grade III diastolic dysfunction (restrictive).  2. Right ventricular systolic function is mildly reduced. The right ventricular size is severely enlarged. There is moderately elevated pulmonary artery systolic pressure. The estimated right ventricular systolic pressure is 14.4 mmHg.  3. Left atrial size was severely dilated.  4. Right atrial size was mildly dilated.  5. A small pericardial effusion is present. The pericardial effusion is circumferential.  6. The mitral valve is abnormal. Mild mitral valve regurgitation. Mild mitral stenosis. Moderate mitral annular calcification.  7. The aortic valve is calcified. Aortic valve regurgitation is moderate to severe. Moderate to severe aortic valve stenosis. GRadient of 36 mm Hg at max despite decreased strok volume index. DVI 0.20. Comparison(s): Significant changes from prior. Atempting to reach primary team. FINDINGS  Left Ventricle: Left ventricular ejection fraction, by estimation, is 25 to 30%. The left ventricle has severely decreased function. The left ventricle demonstrates global hypokinesis. The left ventricular internal cavity size was normal in size. There is no left ventricular hypertrophy. Left ventricular diastolic parameters are consistent with Grade III diastolic dysfunction (restrictive). Right Ventricle: The right ventricular size is severely enlarged. No increase in right ventricular wall thickness. Right ventricular systolic function is mildly reduced. There is moderately elevated pulmonary artery systolic pressure. The tricuspid regurgitant velocity is 2.82 m/s, and with an assumed right atrial pressure of 15 mmHg, the estimated right ventricular systolic pressure is  81.8 mmHg. Left Atrium: Left atrial size was severely dilated. Right Atrium: Right atrial size was mildly  dilated. Pericardium: A small pericardial effusion is present. The pericardial effusion is circumferential. Presence of epicardial fat layer. Mitral Valve: The mitral valve is abnormal. Moderate mitral annular calcification. Mild mitral valve regurgitation. Mild mitral valve stenosis. Tricuspid Valve: The tricuspid valve is normal in structure. Tricuspid valve regurgitation is mild . No evidence of tricuspid stenosis. Aortic Valve: The aortic valve is calcified. Aortic valve regurgitation is moderate to severe. Aortic regurgitation PHT measures 160 msec. Moderate to severe aortic stenosis is present. Aortic valve mean gradient measures 36.0 mmHg. Aortic valve peak gradient measures 59.9 mmHg. Aortic valve area, by VTI measures 0.54 cm. Pulmonic Valve: The pulmonic valve was grossly normal. Pulmonic valve regurgitation is trivial. No evidence of pulmonic stenosis. Aorta: The aortic root is normal in size and structure. IAS/Shunts: No atrial level shunt detected by color flow Doppler.  LEFT VENTRICLE PLAX 2D LVIDd:         5.50 cm LVIDs:         4.70 cm LV PW:         1.10 cm LV IVS:        1.00 cm LVOT diam:     1.90 cm LV SV:         46 LV SV Index:   24 LVOT Area:     2.84 cm  LV Volumes (MOD) LV vol d, MOD A2C: 177.0 ml LV vol d, MOD A4C: 211.0 ml LV vol s, MOD A2C: 116.0 ml LV vol s, MOD A4C: 133.0 ml LV SV MOD A2C:     61.0 ml LV SV MOD A4C:     211.0 ml LV SV MOD BP:      73.3 ml RIGHT VENTRICLE RV S prime:     7.62 cm/s TAPSE (M-mode): 1.5 cm LEFT ATRIUM              Index        RIGHT ATRIUM           Index LA diam:        5.30 cm  2.75 cm/m   RA Area:     21.40 cm LA Vol (A2C):   148.0 ml 76.76 ml/m  RA Volume:   71.10 ml  36.88 ml/m LA Vol (A4C):   143.0 ml 74.17 ml/m LA Biplane Vol: 148.0 ml 76.76 ml/m  AORTIC VALVE AV Area (Vmax):    0.55 cm AV Area (Vmean):   0.64 cm AV Area (VTI):     0.54  cm AV Vmax:           387.00 cm/s AV Vmean:          232.500 cm/s AV VTI:            0.863 m AV Peak Grad:      59.9 mmHg AV Mean Grad:      36.0 mmHg LVOT Vmax:         75.70 cm/s LVOT Vmean:        52.500 cm/s LVOT VTI:          0.164 m LVOT/AV VTI ratio: 0.19 AI PHT:            160 msec  AORTA Ao Root diam: 3.10 cm MITRAL VALVE                TRICUSPID VALVE MV Area (PHT): 6.17 cm     TR Peak grad:   31.8 mmHg MV Decel Time: 123 msec     TR Vmax:  282.00 cm/s MR Peak grad: 81.0 mmHg MR Mean grad: 49.0 mmHg     SHUNTS MR Vmax:      450.00 cm/s   Systemic VTI:  0.16 m MR Vmean:     322.0 cm/s    Systemic Diam: 1.90 cm MV E velocity: 114.00 cm/s MV A velocity: 34.70 cm/s MV E/A ratio:  3.29 Rudean Haskell MD Electronically signed by Rudean Haskell MD Signature Date/Time: 11/01/2021/1:03:57 PM    Final    CT Chest Wo Contrast  Result Date: 10/05/2021 CLINICAL DATA:  Cough and shortness of breath. EXAM: CT CHEST WITHOUT CONTRAST TECHNIQUE: Multidetector CT imaging of the chest was performed following the standard protocol without IV contrast. RADIATION DOSE REDUCTION: This exam was performed according to the departmental dose-optimization program which includes automated exposure control, adjustment of the mA and/or kV according to patient size and/or use of iterative reconstruction technique. COMPARISON:  CT chest 03/02/2013 FINDINGS: Cardiovascular: Heart is borderline enlarged to mildly increased. No pericardial effusion. Coronary artery calcification is evident. Aortic valve calcification evident. Mild atherosclerotic calcification is noted in the wall of the thoracic aorta. Mediastinum/Nodes: Scattered small to upper normal mediastinal lymph nodes evident including 10 mm short axis precarinal node on 56/2. No evidence for gross hilar lymphadenopathy although assessment is limited by the lack of intravenous contrast on the current study. The esophagus has normal imaging features. Upper  normal left axillary and subpectoral lymphadenopathy evident. Lungs/Pleura: Large right and moderate left pleural effusions noted with bibasilar lower lobe collapse/consolidative disease. Patchy ground-glass opacity in both lungs may be related to edema. No suspicious pulmonary nodule or mass within the aerated portion of either lung. Upper Abdomen: Moderate to large volume ascites seen in the visualized upper abdomen. Incomplete visualization of the kidneys suggests bilateral renal atrophy. Musculoskeletal: No worrisome lytic or sclerotic osseous abnormality. IMPRESSION: 1. Large right and moderate left pleural effusions with bibasilar lower lobe collapse/consolidative disease. 2. Patchy ground-glass opacity in both lungs may be related to edema. 3. Moderate to large volume ascites in the visualized upper abdomen. 4. Incomplete visualization of the kidneys suggests bilateral renal atrophy. 5.  Aortic Atherosclerosis (ICD10-I70.0). Electronically Signed   By: Misty Stanley M.D.   On: 10/19/2021 17:08   DG Foot Complete Right  Result Date: 10/11/2021 CLINICAL DATA:  Pain diabetic foot ulcers EXAM: RIGHT FOOT COMPLETE - 3+ VIEW COMPARISON:  05/25/2016 FINDINGS: There is no evidence of fracture or dislocation. There is no evidence of arthropathy or other focal bone abnormality. Soft tissue wound over the medial great toe. Soft tissue edema of the forefoot. Vascular calcinosis. IMPRESSION: 1. No fracture or dislocation of the right foot. No radiographic findings of osteomyelitis. 2. Soft tissue wound over the medial great toe. Soft tissue edema of the forefoot. Electronically Signed   By: Delanna Ahmadi M.D.   On: 10/25/2021 15:48   DG Chest 2 View  Result Date: 10/06/2021 CLINICAL DATA:  Shortness of breath and cough. EXAM: CHEST - 2 VIEW COMPARISON:  Two-view chest x-ray 07/23/2021 FINDINGS: Heart is enlarged. Bilateral pleural effusions are present, right greater than left. Mild associated airspace disease  is present. The upper lung fields are clear. IMPRESSION: 1. Cardiomegaly without failure. 2. Bilateral pleural effusions and associated airspace disease, likely atelectasis, right greater than left. Electronically Signed   By: San Morelle M.D.   On: 10/14/2021 14:45   Medications: Infusions:  anticoagulant sodium citrate     azithromycin Stopped (11/01/21 1715)   ceFEPime (MAXIPIME) IV  vancomycin      Scheduled Medications:  atorvastatin  20 mg Oral Daily   calcium acetate  667 mg Oral TID WC   Chlorhexidine Gluconate Cloth  6 each Topical Q0600   feeding supplement (GLUCERNA SHAKE)  237 mL Oral TID BM   ferrous sulfate  325 mg Oral Q breakfast   insulin aspart  0-5 Units Subcutaneous QHS   insulin aspart  0-6 Units Subcutaneous TID WC   insulin glargine-yfgn  8 Units Subcutaneous QHS   midodrine  10 mg Oral Q M,W,F-HD   pantoprazole  40 mg Oral Daily    have reviewed scheduled and prn medications.  Physical Exam: General:  thin, pale, NAD-  not very talkative Heart:RRR Lungs:dec BS at bases Abdomen:soft,non tender Extremities: pitting edema to dep areas Dialysis Access: left AVF    11/02/2021,8:30 AM  LOS: 2 days

## 2021-11-02 NOTE — Procedures (Signed)
PreOperative Dx: RIGHT pleural effusion Postoperative Dx: RIGHT pleural effusion Procedure:   US guided RIGHT thoracentesis Radiologist:  Thornton Papas Anesthesia:  10 ml of 1% lidocaine Specimen:  1.5 L of yellow colored fluid EBL:   < 1 ml Complications:  None

## 2021-11-02 NOTE — Progress Notes (Signed)
PROGRESS NOTE   Paul Gonzalez  JOA:[id number] DOB: [birth year] DOA: 10/07/2021 PCP: Coral Spikes, DO   Chief Complaint  Patient presents with   Shortness of Breath   Level of care: Telemetry Medical  Brief Admission History:  56 y.o. male with medical history significant of Hypertension, hyperlipidemia, ESRD on HD (MWF), anemia of chronic disease, diabetic retinopathy with vision loss who presents to the emergency department due to worsening shortness of breath that has been ongoing for about 2 weeks, this was associated with nonproductive cough.  He  also complained of bilateral diabetic foot ulcers and states that he has not been able to get an appointment with a podiatrist.  He denies chest pain, fever, chills, nausea, vomiting, diarrhea or constipation.   ED Course:  In the emergency department, he was intermittently tachypneic, BP was soft at 105/52, other vital signs were within normal range.  Work-up in the ED showed leukocytosis, normocytic anemia, BMP showed sodium 135, potassium 4.9, chloride 93, bicarb 25, glucose 173, BUN 45, creatinine 5.14, albumin 3.3, AST 57, ALT 47, total bilirubin 1.6.  Anion gap 17, lactic acid 2.4 > 2.8, BNP > 4,500.  Influenza A, B, SARS coronavirus 2 was negative.  Right foot x-ray showed no fracture or dislocation of the right foot.  No radiographic findings of osteomyelitis.  Soft tissue wound over the medial great toe.  Soft tissue edema of the forefoot.  Chest x-ray showed cardiomegaly without failure.  Bilateral pleural effusion and associated airspace disease, likely atelectasis, right greater than left.  He was treated with IV ceftriaxone, vancomycin and azithromycin.  Midodrine was given.  IV hydration was provided.  Hospitalist was asked to admit patient for further evaluation and management.  Orthopedic surgeon (Dr. Erlinda Hong) was consulted and patient will be seen when he arrives at [facility].   Assessment and Plan:  Sepsis secondary to gangrene  right toe with cellulitis - orthopedics consulted Dr Erlinda Hong who will see patient at [facility] - pt will require amputation of gangrenous tissue - continue broad spectrum antibiotics   Volume overload - fluid management with hemodialysis   New finding of Cardiomyopathy  HFrEF - echo with findings of change in EF now down to 20-25% - inpatient cardiology consultation requested - pt may require TEE to better assess valves  - appreciate cardiology team recs; they will follow him at [facility]   B Fragilis Bacteremia - discussed with pharm D and changed to IV Zosyn for better coverage  ESRD on HD - He is on schedule with MWF HD treatments - nephrology was consulted regarding inpatient HD treatments - electrolytes are stable at this time  - nephrology team will follow him when he arrives at [facility]  Lactic acidosis Lactate down to 2.1 now  Bilateral Pleural Effusions R>L - thoracentesis ordered for large right effusion, completed 10/30 with 1.5L removed - continue supportive care and volume management with HD for now - not currently with any symptoms of respiratory distress   Leukocytosis  - monitor CBC/diff as response to treating infection - follow blood cultures   Diabetes mellitus with neurological, renal, vascular complications - continue SSI coverage, daily lantus 8 units for basal coverage, frequent CBG monitoring CBG (last 3)  Recent Labs    11/01/21 2247 11/02/21 0536 11/02/21 0744  GLUCAP 172* 164* 174*   Chronic Hypotension  - we have resumed his home midodrine   DVT prophylaxis: Morrison heparin  Code Status: Full  Family Communication: wife bedside 10/29 Disposition: Status is:  Inpatient Remains inpatient appropriate because: IV antibiotics, intensity   Consultants:  Orthopedics Dr Erlinda Hong  Procedures: hemodialysis  Antimicrobials:  Vanc, cefepime  Subjective: Pt breathing better after thoracentesis   Objective: Vitals:   11/02/21 1100 11/02/21 1130 11/02/21 1200 11/02/21 1230   BP: (!) 90/43 (!) 87/55 (!) 105/49 (!) 94/50  Pulse: 80 79 82 78  Resp: '10 19 15 16  '$ Temp:      TempSrc:      SpO2: 100% 100% 100% 100%  Weight:      Height:        Intake/Output Summary (Last 24 hours) at 11/02/2021 1253 Last data filed at 11/01/2021 1715 Gross per 24 hour  Intake 250 ml  Output --  Net 250 ml   Filed Weights   10/19/2021 1423 11/02/21 1046  Weight: 77.1 kg 61 kg   Examination:  General exam: Appears calm and comfortable appears chronically ill.  Respiratory system: diminished BS RLL  Cardiovascular system: normal S1 & S2 heard. No JVD, murmurs, rubs, gallops or clicks. No pedal edema. Gastrointestinal system: Abdomen is nondistended, soft and nontender. No organomegaly or masses felt. Normal bowel sounds heard. Central nervous system: Alert and oriented. No focal neurological deficits. 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  Skin: bilateral foot ulceration lesions, discoloration/gangrene seen Psychiatry: Judgement and insight appear poor. Mood & affect flat.   Data Reviewed: I have personally reviewed following labs and imaging studies  CBC: Recent Labs  Lab 10/24/2021 1553 11/01/21 0525 11/02/21 0604  WBC 19.6* 16.0* 15.8*  NEUTROABS 17.6*  --  13.7*  HGB 12.6* 11.3* 11.1*  HCT 40.1 35.3* 34.7*  MCV 91.1 88.7 89.2  PLT 185 159 182    Basic Metabolic Panel: Recent Labs  Lab 10/19/2021 1553 11/01/21 0525 11/02/21 0604  NA 135 134* 134*  K 4.9 4.9 5.0  CL 93* 96* 96*  CO2 '25 25 25  '$ GLUCOSE 173* 156* 174*  BUN 45* 49* 63*  CREATININE 5.14* 5.27* 6.21*  CALCIUM 10.0 9.7 9.4  MG  --  2.1  --   PHOS  --  7.0* 7.5*    CBG: Recent Labs  Lab 11/01/21 1119 11/01/21 1650 11/01/21 2247 11/02/21 0536 11/02/21 0744  GLUCAP 204* 260* 172* 164* 174*    Recent Results (from the past 240 hour(s))  Resp Panel by RT-PCR (Flu A&B, Covid) Anterior Nasal Swab     Status: None   Collection Time: 10/07/2021   3:22 PM   Specimen: Anterior Nasal Swab  Result Value Ref Range Status   SARS Coronavirus 2 by RT PCR NEGATIVE NEGATIVE Final    Comment: (NOTE) SARS-CoV-2 target nucleic acids are NOT DETECTED.  The SARS-CoV-2 RNA is generally detectable in upper respiratory specimens during the acute phase of infection. The lowest concentration of SARS-CoV-2 viral copies this assay can detect is 138 copies/mL. A negative result does not preclude SARS-Cov-2 infection and should not be used as the sole basis for treatment or other patient management decisions. A negative result may occur with  improper specimen collection/handling, submission of specimen other than nasopharyngeal swab, presence of viral mutation(s) within the areas targeted by this assay, and inadequate number of viral copies(<138 copies/mL). A negative result must be combined with clinical observations, patient history, and epidemiological information. The expected result is Negative.  Fact Sheet for Patients:  EntrepreneurPulse.com.au  Fact Sheet for Healthcare Providers:  IncredibleEmployment.be  This test is no t yet approved or cleared by the [place]  States FDA and  has been authorized for detection and/or diagnosis of SARS-CoV-2 by FDA under an Emergency Use Authorization (EUA). This EUA will remain  in effect (meaning this test can be used) for the duration of the COVID-19 declaration under Section 564(b)(1) of the Act, 21 U.S.C.section 360bbb-3(b)(1), unless the authorization is terminated  or revoked sooner.       Influenza A by PCR NEGATIVE NEGATIVE Final   Influenza B by PCR NEGATIVE NEGATIVE Final    Comment: (NOTE) The Xpert Xpress SARS-CoV-2/FLU/RSV plus assay is intended as an aid in the diagnosis of influenza from Nasopharyngeal swab specimens and should not be used as a sole basis for treatment. Nasal washings and aspirates are unacceptable for Xpert Xpress  SARS-CoV-2/FLU/RSV testing.  Fact Sheet for Patients: EntrepreneurPulse.com.au  Fact Sheet for Healthcare Providers: IncredibleEmployment.be  This test is not yet approved or cleared by the [place] FDA and has been authorized for detection and/or diagnosis of SARS-CoV-2 by FDA under an Emergency Use Authorization (EUA). This EUA will remain in effect (meaning this test can be used) for the duration of the COVID-19 declaration under Section 564(b)(1) of the Act, 21 U.S.C. section 360bbb-3(b)(1), unless the authorization is terminated or revoked.  Performed at [facility], [address], [place], [place] [address]   Blood Culture (routine x 2)     Status: None (Preliminary result)   Collection Time: 11/03/2021  3:53 PM   Specimen: BLOOD RIGHT ARM  Result Value Ref Range Status   Specimen Description BLOOD RIGHT ARM  Final   Special Requests   Final    BOTTLES DRAWN AEROBIC AND ANAEROBIC Blood Culture adequate volume   Culture   Final    NO GROWTH 2 DAYS Performed at [facility], [address] [place], [place] [address]    Report Status PENDING  Incomplete  Blood Culture (routine x 2)     Status: None (Preliminary result)   Collection Time: 10/16/2021  3:54 PM   Specimen: Right Antecubital; Blood  Result Value Ref Range Status   Specimen Description   Final    RIGHT ANTECUBITAL Performed at [facility], [address], [place], [place] [address]    Special Requests   Final    BOTTLES DRAWN AEROBIC AND ANAEROBIC Blood Culture results may not be optimal due to an excessive volume of blood received in culture bottles Performed at [facility], [address] [place], [place] [address]    Culture  Setup Time   Final    GRAM NEGATIVE RODS Gram Stain Report Called to,Read Back By and Verified With:  J. LONG @ 1512 BY STEPHTR 11/01/21 CRITICAL RESULT CALLED TO, READ BACK BY AND VERIFIED WITH: E PREYER,RN'@0125'$  0/30/23 [place]    Culture   Final     CULTURE REINCUBATED FOR BETTER GROWTH Performed at [facility], [place] [address] [place], [place] [address]    Report Status PENDING  Incomplete  Blood Culture ID Panel (Reflexed)     Status: Abnormal   Collection Time: 10/06/2021  3:54 PM  Result Value Ref Range Status   Enterococcus faecalis NOT DETECTED NOT DETECTED Final   Enterococcus Faecium NOT DETECTED NOT DETECTED Final   Listeria monocytogenes NOT DETECTED NOT DETECTED Final   Staphylococcus species NOT DETECTED NOT DETECTED Final   Staphylococcus aureus (BCID) NOT DETECTED NOT DETECTED Final   Staphylococcus epidermidis NOT DETECTED NOT DETECTED Final   Staphylococcus lugdunensis NOT DETECTED NOT DETECTED Final   Streptococcus species NOT DETECTED  NOT DETECTED Final   Streptococcus agalactiae NOT DETECTED NOT DETECTED Final   Streptococcus pneumoniae NOT DETECTED NOT DETECTED Final   Streptococcus pyogenes NOT DETECTED NOT DETECTED Final   A.calcoaceticus-baumannii NOT DETECTED NOT DETECTED Final   Bacteroides fragilis DETECTED (A) NOT DETECTED Final    Comment: CRITICAL RESULT CALLED TO, READ BACK BY AND VERIFIED WITH: E PREYER,RN'@0125'$  11/02/21 [place]    Enterobacterales NOT DETECTED NOT DETECTED Final   Enterobacter cloacae complex NOT DETECTED NOT DETECTED Final   Escherichia coli NOT DETECTED NOT DETECTED Final   Klebsiella aerogenes NOT DETECTED NOT DETECTED Final   Klebsiella oxytoca NOT DETECTED NOT DETECTED Final   Klebsiella pneumoniae NOT DETECTED NOT DETECTED Final   Proteus species NOT DETECTED NOT DETECTED Final   Salmonella species NOT DETECTED NOT DETECTED Final   Serratia marcescens NOT DETECTED NOT DETECTED Final   Haemophilus influenzae NOT DETECTED NOT DETECTED Final   Neisseria meningitidis NOT DETECTED NOT DETECTED Final   Pseudomonas aeruginosa NOT DETECTED NOT DETECTED Final   Stenotrophomonas maltophilia NOT DETECTED NOT DETECTED Final   Candida albicans NOT DETECTED NOT DETECTED Final    Candida auris NOT DETECTED NOT DETECTED Final   Candida glabrata NOT DETECTED NOT DETECTED Final   Candida krusei NOT DETECTED NOT DETECTED Final   Candida parapsilosis NOT DETECTED NOT DETECTED Final   Candida tropicalis NOT DETECTED NOT DETECTED Final   Cryptococcus neoformans/gattii NOT DETECTED NOT DETECTED Final    Comment: Performed at [facility], [place] [address], [place], [place] [address]  Culture, body fluid w Gram Stain-bottle     Status: None (Preliminary result)   Collection Time: 11/02/21  8:54 AM   Specimen: Pleura  Result Value Ref Range Status   Specimen Description PLEURAL BOTTLES DRAWN AEROBIC AND ANAEROBIC  Final   Special Requests   Final    Blood Culture adequate volume Performed at [facility], [address] [place], [place] [address]    Culture PENDING  Incomplete   Report Status PENDING  Incomplete  Gram stain     Status: None   Collection Time: 11/02/21  8:54 AM   Specimen: Pleura  Result Value Ref Range Status   Specimen Description PLEURAL  Final   Special Requests NONE  Final   Gram Stain   Final    NO ORGANISMS SEEN WBC PRESENT, PREDOMINANTLY PMN CYTOSPIN SMEAR Performed at [facility], [address] [place], [place] [address]    Report Status 11/02/2021 FINAL  Final     Radiology Studies: US THORACENTESIS ASP PLEURAL SPACE W/IMG GUIDE  Result Date: 11/02/2021 INDICATION: BILATERAL pleural effusions larger on RIGHT, shortness of breath, history end-stage renal disease on dialysis, diabetes mellitus, hypertension EXAM: ULTRASOUND GUIDED DIAGNOSTIC AND THERAPEUTIC RIGHT THORACENTESIS MEDICATIONS: None. COMPLICATIONS: None immediate. PROCEDURE: An ultrasound guided thoracentesis was thoroughly discussed with the patient and questions answered. The benefits, risks, alternatives and complications were also discussed. The patient understands and wishes to proceed with the procedure. Written consent was obtained. Ultrasound was performed to  localize and mark an adequate pocket of fluid in the RIGHT chest. The area was then prepped and draped in the normal sterile fashion. 1% Lidocaine was used for local anesthesia. Under ultrasound guidance a 6 Fr Safe-T-Centesis catheter was introduced. Thoracentesis was performed. The catheter was removed and a dressing applied. FINDINGS: A total of approximately 1.5 L of yellow fluid was removed. Samples were sent to the laboratory as requested by the clinical team. IMPRESSION: Successful ultrasound guided RIGHT  thoracentesis yielding 1.5 L of pleural fluid. Electronically Signed   By: Lavonia Dana M.D.   On: 11/02/2021 10:31   DG Chest 1 View  Result Date: 11/02/2021 CLINICAL DATA:  Status post left thoracentesis. EXAM: CHEST  1 VIEW COMPARISON:  October 31, 2021. FINDINGS: No pneumothorax is noted status post left thoracentesis. Left pleural effusion appears to be smaller. Mild bibasilar subsegmental atelectasis is noted. IMPRESSION: No pneumothorax status post left thoracentesis. Electronically Signed   By: Marijo Conception M.D.   On: 11/02/2021 09:41   ECHOCARDIOGRAM COMPLETE  Result Date: 11/01/2021    ECHOCARDIOGRAM REPORT   Patient Name:   Paul Gonzalez Date of Exam: 11/01/2021 Medical Rec #:  [id number]       Height:       69.0 in Accession #:    [id number]      Weight:       170.0 lb Date of Birth:  [birth year]       BSA:          1.928 m Patient Age:    73 years        BP:           98/56 mmHg Patient Gender: M               HR:           88 bpm. Exam Location:  [facility] Procedure: 2D Echo, Cardiac Doppler and Color Doppler Indications:    CHF-Acute Systolic U13.24  History:        Patient has prior history of Echocardiogram examinations, most                 recent 03/19/2021. Risk Factors:Hypertension, Diabetes and                 Dyslipidemia. ESRD (end stage renal disease) on dialysis.                 Elevated brain natriuretic peptide (BNP) level. Pleural                 effusion, bilateral.   Sonographer:    Alvino Chapel RCS Referring Phys: [id number] OLADAPO ADEFESO IMPRESSIONS  1. Left ventricular ejection fraction, by estimation, is 25 to 30%. The left ventricle has severely decreased function. The left ventricle demonstrates global hypokinesis. Left ventricular diastolic parameters are consistent with Grade III diastolic dysfunction (restrictive).  2. Right ventricular systolic function is mildly reduced. The right ventricular size is severely enlarged. There is moderately elevated pulmonary artery systolic pressure. The estimated right ventricular systolic pressure is 53.6 mmHg.  3. Left atrial size was severely dilated.  4. Right atrial size was mildly dilated.  5. A small pericardial effusion is present. The pericardial effusion is circumferential.  6. The mitral valve is abnormal. Mild mitral valve regurgitation. Mild mitral stenosis. Moderate mitral annular calcification.  7. The aortic valve is calcified. Aortic valve regurgitation is moderate to severe. Moderate to severe aortic valve stenosis. GRadient of 36 mm Hg at max despite decreased strok volume index. DVI 0.20. Comparison(s): Significant changes from prior. Atempting to reach primary team. FINDINGS  Left Ventricle: Left ventricular ejection fraction, by estimation, is 25 to 30%. The left ventricle has severely decreased function. The left ventricle demonstrates global hypokinesis. The left ventricular internal cavity size was normal in size. There is no left ventricular hypertrophy. Left ventricular diastolic parameters are consistent with Grade III diastolic dysfunction (restrictive). Right Ventricle: The right ventricular size is severely  enlarged. No increase in right ventricular wall thickness. Right ventricular systolic function is mildly reduced. There is moderately elevated pulmonary artery systolic pressure. The tricuspid regurgitant velocity is 2.82 m/s, and with an assumed right atrial pressure of 15 mmHg, the estimated right  ventricular systolic pressure is 70.9 mmHg. Left Atrium: Left atrial size was severely dilated. Right Atrium: Right atrial size was mildly dilated. Pericardium: A small pericardial effusion is present. The pericardial effusion is circumferential. Presence of epicardial fat layer. Mitral Valve: The mitral valve is abnormal. Moderate mitral annular calcification. Mild mitral valve regurgitation. Mild mitral valve stenosis. Tricuspid Valve: The tricuspid valve is normal in structure. Tricuspid valve regurgitation is mild . No evidence of tricuspid stenosis. Aortic Valve: The aortic valve is calcified. Aortic valve regurgitation is moderate to severe. Aortic regurgitation PHT measures 160 msec. Moderate to severe aortic stenosis is present. Aortic valve mean gradient measures 36.0 mmHg. Aortic valve peak gradient measures 59.9 mmHg. Aortic valve area, by VTI measures 0.54 cm. Pulmonic Valve: The pulmonic valve was grossly normal. Pulmonic valve regurgitation is trivial. No evidence of pulmonic stenosis. Aorta: The aortic root is normal in size and structure. IAS/Shunts: No atrial level shunt detected by color flow Doppler.  LEFT VENTRICLE PLAX 2D LVIDd:         5.50 cm LVIDs:         4.70 cm LV PW:         1.10 cm LV IVS:        1.00 cm LVOT diam:     1.90 cm LV SV:         46 LV SV Index:   24 LVOT Area:     2.84 cm  LV Volumes (MOD) LV vol d, MOD A2C: 177.0 ml LV vol d, MOD A4C: 211.0 ml LV vol s, MOD A2C: 116.0 ml LV vol s, MOD A4C: 133.0 ml LV SV MOD A2C:     61.0 ml LV SV MOD A4C:     211.0 ml LV SV MOD BP:      73.3 ml RIGHT VENTRICLE RV S prime:     7.62 cm/s TAPSE (M-mode): 1.5 cm LEFT ATRIUM              Index        RIGHT ATRIUM           Index LA diam:        5.30 cm  2.75 cm/m   RA Area:     21.40 cm LA Vol (A2C):   148.0 ml 76.76 ml/m  RA Volume:   71.10 ml  36.88 ml/m LA Vol (A4C):   143.0 ml 74.17 ml/m LA Biplane Vol: 148.0 ml 76.76 ml/m  AORTIC VALVE AV Area (Vmax):    0.55 cm AV Area (Vmean):    0.64 cm AV Area (VTI):     0.54 cm AV Vmax:           387.00 cm/s AV Vmean:          232.500 cm/s AV VTI:            0.863 m AV Peak Grad:      59.9 mmHg AV Mean Grad:      36.0 mmHg LVOT Vmax:         75.70 cm/s LVOT Vmean:        52.500 cm/s LVOT VTI:          0.164 m LVOT/AV VTI ratio: 0.19 AI PHT:  160 msec  AORTA Ao Root diam: 3.10 cm MITRAL VALVE                TRICUSPID VALVE MV Area (PHT): 6.17 cm     TR Peak grad:   31.8 mmHg MV Decel Time: 123 msec     TR Vmax:        282.00 cm/s MR Peak grad: 81.0 mmHg MR Mean grad: 49.0 mmHg     SHUNTS MR Vmax:      450.00 cm/s   Systemic VTI:  0.16 m MR Vmean:     322.0 cm/s    Systemic Diam: 1.90 cm MV E velocity: 114.00 cm/s MV A velocity: 34.70 cm/s MV E/A ratio:  3.29 Rudean Haskell MD Electronically signed by Rudean Haskell MD Signature Date/Time: 11/01/2021/1:03:57 PM    Final    CT Chest Wo Contrast  Result Date: 10/16/2021 CLINICAL DATA:  Cough and shortness of breath. EXAM: CT CHEST WITHOUT CONTRAST TECHNIQUE: Multidetector CT imaging of the chest was performed following the standard protocol without IV contrast. RADIATION DOSE REDUCTION: This exam was performed according to the departmental dose-optimization program which includes automated exposure control, adjustment of the mA and/or kV according to patient size and/or use of iterative reconstruction technique. COMPARISON:  CT chest 03/02/2013 FINDINGS: Cardiovascular: Heart is borderline enlarged to mildly increased. No pericardial effusion. Coronary artery calcification is evident. Aortic valve calcification evident. Mild atherosclerotic calcification is noted in the wall of the thoracic aorta. Mediastinum/Nodes: Scattered small to upper normal mediastinal lymph nodes evident including 10 mm short axis precarinal node on 56/2. No evidence for gross hilar lymphadenopathy although assessment is limited by the lack of intravenous contrast on the current study. The esophagus has  normal imaging features. Upper normal left axillary and subpectoral lymphadenopathy evident. Lungs/Pleura: Large right and moderate left pleural effusions noted with bibasilar lower lobe collapse/consolidative disease. Patchy ground-glass opacity in both lungs may be related to edema. No suspicious pulmonary nodule or mass within the aerated portion of either lung. Upper Abdomen: Moderate to large volume ascites seen in the visualized upper abdomen. Incomplete visualization of the kidneys suggests bilateral renal atrophy. Musculoskeletal: No worrisome lytic or sclerotic osseous abnormality. IMPRESSION: 1. Large right and moderate left pleural effusions with bibasilar lower lobe collapse/consolidative disease. 2. Patchy ground-glass opacity in both lungs may be related to edema. 3. Moderate to large volume ascites in the visualized upper abdomen. 4. Incomplete visualization of the kidneys suggests bilateral renal atrophy. 5.  Aortic Atherosclerosis (ICD10-I70.0). Electronically Signed   By: Misty Stanley M.D.   On: 10/28/2021 17:08   DG Foot Complete Right  Result Date: 10/12/2021 CLINICAL DATA:  Pain diabetic foot ulcers EXAM: RIGHT FOOT COMPLETE - 3+ VIEW COMPARISON:  05/25/2016 FINDINGS: There is no evidence of fracture or dislocation. There is no evidence of arthropathy or other focal bone abnormality. Soft tissue wound over the medial great toe. Soft tissue edema of the forefoot. Vascular calcinosis. IMPRESSION: 1. No fracture or dislocation of the right foot. No radiographic findings of osteomyelitis. 2. Soft tissue wound over the medial great toe. Soft tissue edema of the forefoot. Electronically Signed   By: Delanna Ahmadi M.D.   On: 10/28/2021 15:48   DG Chest 2 View  Result Date: 10/14/2021 CLINICAL DATA:  Shortness of breath and cough. EXAM: CHEST - 2 VIEW COMPARISON:  Two-view chest x-ray 07/23/2021 FINDINGS: Heart is enlarged. Bilateral pleural effusions are present, right greater than left.  Mild associated airspace disease is present.  The upper lung fields are clear. IMPRESSION: 1. Cardiomegaly without failure. 2. Bilateral pleural effusions and associated airspace disease, likely atelectasis, right greater than left. Electronically Signed   By: San Morelle M.D.   On: 10/18/2021 14:45    Scheduled Meds:  atorvastatin  20 mg Oral Daily   calcitRIOL  0.25 mcg Oral Q M,W,F-HD   calcium acetate  667 mg Oral TID WC   Chlorhexidine Gluconate Cloth  6 each Topical Q0600   feeding supplement (GLUCERNA SHAKE)  237 mL Oral TID BM   ferrous sulfate  325 mg Oral Q breakfast   insulin aspart  0-5 Units Subcutaneous QHS   insulin aspart  0-6 Units Subcutaneous TID WC   insulin glargine-yfgn  8 Units Subcutaneous QHS   midodrine  10 mg Oral TID WC   pantoprazole  40 mg Oral Daily   Continuous Infusions:  anticoagulant sodium citrate     piperacillin-tazobactam (ZOSYN)  IV      LOS: 2 days   Time spent: 35 mins  Mylik Pro Wynetta Emery, MD How to contact the [facility] Attending or Consulting provider [place] or covering provider during after hours [place], for this patient?  Check the care team in [facility] and look for a) attending/consulting [facility] provider listed and b) the [facility] team listed Log into [email or web address] and use [place] universal password to access. If you do not have the password, please contact the hospital operator. Locate the [facility] provider you are looking for under [facility] and page to a number that you can be directly reached. If you still have difficulty reaching the provider, please page the [facility] (Director on Call) for the Hospitalists listed on amion for assistance.  11/02/2021, 12:53 PM

## 2021-11-02 NOTE — Progress Notes (Signed)
Thoracentesis complete no signs of distress. 1500 ml pleural fluid removed. Patient being taken to xray for post thoracentesis chest xray.

## 2021-11-02 NOTE — Progress Notes (Signed)
Received patient in bed to unit.  Alert and oriented.  Informed consent signed and in chart.   Treatment initiated: 1030 Treatment completed: 1430  Patient tolerated well.  Transported back to the room  Alert, without acute distress.  Hand-off given to patient's nurse.   Access used: AVF Access issues: none  Total UF removed: 1.8 L Medication(s) given: Calcitriol 0.25 mcg Po. Albumin 25 g IV. Post HD VS: 92/58 P 80 R 20. O2 sat 100 %  i n 2 L O2 using. Post HD weight: 60 KG   [facility]

## 2021-11-02 NOTE — Progress Notes (Signed)
Pharmacy Antibiotic Note  Paul Gonzalez is a 56 y.o. male admitted on 10/08/2021 with bacteremia/cellulitis  Pharmacy has been consulted for Zosyn dosing.   Plan: Zosyn 2.25g IV every 8 hours. Monitor labs, c/s, and patient improvement.  Height: '5\' 9"'$  (175.3 cm) Weight: 77.1 kg (170 lb) IBW/kg (Calculated) : 70.7  Temp (24hrs), Avg:98.1 F (36.7 C), Min:97.7 F (36.5 C), Max:98.6 F (37 C)  Recent Labs  Lab 11/02/2021 1553 10/04/2021 1801 10/08/2021 2101 11/01/21 0525 11/02/21 0604  WBC 19.6*  --   --  16.0* 15.8*  CREATININE 5.14*  --   --  5.27* 6.21*  LATICACIDVEN 2.4* 2.8* 2.1*  --   --      Estimated Creatinine Clearance: 13.4 mL/min (A) (by C-G formula based on SCr of 6.21 mg/dL (H)).    No Known Allergies  Antimicrobials this admission: Zosyn 10/30 >> Vanco/Cefepime 10/28 >> 10/30 CTX/azith 10/28   Microbiology results: 10/28 blood>> gram neg rods BCID: bacteroides  Margot Ables, PharmD Clinical Pharmacist 11/02/2021 9:55 AM

## 2021-11-03 DIAGNOSIS — N186 End stage renal disease: Secondary | ICD-10-CM | POA: Diagnosis not present

## 2021-11-03 DIAGNOSIS — Z992 Dependence on renal dialysis: Secondary | ICD-10-CM | POA: Diagnosis not present

## 2021-11-03 DIAGNOSIS — E11628 Type 2 diabetes mellitus with other skin complications: Secondary | ICD-10-CM | POA: Diagnosis not present

## 2021-11-03 DIAGNOSIS — I96 Gangrene, not elsewhere classified: Secondary | ICD-10-CM | POA: Diagnosis not present

## 2021-11-03 DIAGNOSIS — I5041 Acute combined systolic (congestive) and diastolic (congestive) heart failure: Secondary | ICD-10-CM | POA: Diagnosis not present

## 2021-11-03 DIAGNOSIS — J90 Pleural effusion, not elsewhere classified: Secondary | ICD-10-CM | POA: Diagnosis not present

## 2021-11-03 LAB — CBC WITH DIFFERENTIAL/PLATELET
Abs Immature Granulocytes: 0.04 10*3/uL (ref 0.00–0.07)
Basophils Absolute: 0.1 10*3/uL (ref 0.0–0.1)
Basophils Relative: 1 %
Eosinophils Absolute: 0.4 10*3/uL (ref 0.0–0.5)
Eosinophils Relative: 3 %
HCT: 35.2 % — ABNORMAL LOW (ref 39.0–52.0)
Hemoglobin: 11.2 g/dL — ABNORMAL LOW (ref 13.0–17.0)
Immature Granulocytes: 0 %
Lymphocytes Relative: 4 %
Lymphs Abs: 0.5 10*3/uL — ABNORMAL LOW (ref 0.7–4.0)
MCH: 28.6 pg (ref 26.0–34.0)
MCHC: 31.8 g/dL (ref 30.0–36.0)
MCV: 90 fL (ref 80.0–100.0)
Monocytes Absolute: 0.9 10*3/uL (ref 0.1–1.0)
Monocytes Relative: 7 %
Neutro Abs: 11 10*3/uL — ABNORMAL HIGH (ref 1.7–7.7)
Neutrophils Relative %: 85 %
Platelets: 126 10*3/uL — ABNORMAL LOW (ref 150–400)
RBC: 3.91 MIL/uL — ABNORMAL LOW (ref 4.22–5.81)
RDW: 20.8 % — ABNORMAL HIGH (ref 11.5–15.5)
WBC: 12.9 10*3/uL — ABNORMAL HIGH (ref 4.0–10.5)
nRBC: 0 % (ref 0.0–0.2)

## 2021-11-03 LAB — RENAL FUNCTION PANEL
Albumin: 2.6 g/dL — ABNORMAL LOW (ref 3.5–5.0)
Anion gap: 10 (ref 5–15)
BUN: 40 mg/dL — ABNORMAL HIGH (ref 6–20)
CO2: 27 mmol/L (ref 22–32)
Calcium: 8.8 mg/dL — ABNORMAL LOW (ref 8.9–10.3)
Chloride: 95 mmol/L — ABNORMAL LOW (ref 98–111)
Creatinine, Ser: 5.04 mg/dL — ABNORMAL HIGH (ref 0.61–1.24)
GFR, Estimated: 13 mL/min — ABNORMAL LOW (ref >60)
Glucose, Bld: 150 mg/dL — ABNORMAL HIGH (ref 70–99)
Phosphorus: 5.5 mg/dL — ABNORMAL HIGH (ref 2.5–4.6)
Potassium: 3.9 mmol/L (ref 3.5–5.1)
Sodium: 132 mmol/L — ABNORMAL LOW (ref 135–145)

## 2021-11-03 LAB — CULTURE, BLOOD (ROUTINE X 2)

## 2021-11-03 LAB — CYTOLOGY - NON PAP

## 2021-11-03 LAB — GLUCOSE, CAPILLARY
Glucose-Capillary: 192 mg/dL — ABNORMAL HIGH (ref 70–99)
Glucose-Capillary: 176 mg/dL — ABNORMAL HIGH (ref 70–99)

## 2021-11-03 LAB — HEPATITIS B SURFACE ANTIBODY, QUANTITATIVE: Hep B S AB Quant (Post): 655.1 m[IU]/mL (ref >9.9)

## 2021-11-03 LAB — CBG MONITORING, ED
Glucose-Capillary: 120 mg/dL — ABNORMAL HIGH (ref 70–99)
Glucose-Capillary: 160 mg/dL — ABNORMAL HIGH (ref 70–99)

## 2021-11-03 MED ORDER — CHLORHEXIDINE GLUCONATE CLOTH 2 % EX PADS
6.0000 | MEDICATED_PAD | Freq: Every day | CUTANEOUS | Status: DC
  Administered 2021-11-05 – 2021-11-11 (×5): 6 via TOPICAL

## 2021-11-03 MED ORDER — PIPERACILLIN-TAZOBACTAM IN DEX 2-0.25 GM/50ML IV SOLN
2.2500 g | Freq: Three times a day (TID) | INTRAVENOUS | Status: DC
  Filled 2021-11-03: qty 50

## 2021-11-03 MED ORDER — GUAIFENESIN-DM 100-10 MG/5ML PO SYRP
5.0000 mL | ORAL_SOLUTION | ORAL | Status: DC | PRN
  Administered 2021-11-03 – 2021-11-05 (×2): 5 mL via ORAL
  Filled 2021-11-03 (×3): qty 5

## 2021-11-03 MED ORDER — GUAIFENESIN-DM 100-10 MG/5ML PO SYRP
5.0000 mL | ORAL_SOLUTION | Freq: Once | ORAL | Status: AC
  Administered 2021-11-03: 5 mL via ORAL
  Filled 2021-11-03: qty 5

## 2021-11-03 NOTE — Progress Notes (Signed)
Subjective:  Still in ER at [facility]-  had HD yesterday -  only could remove 1800 due to low BP-  post weight 60  likely not right as edw is 77)   he is being fed by his wife-  no complaints  Objective Vital signs in last 24 hours: Vitals:   11/03/21 0530 11/03/21 0600 11/03/21 0730 11/03/21 0800  BP: (!) 97/58 (!) 93/52 (!) 97/52 (!) 98/52  Pulse:   83 80  Resp: (!) 21 (!) 23 (!) 22 (!) 24  Temp:   98.7 F (37.1 C)   TempSrc:   Oral   SpO2:  98% 100% 100%  Weight:      Height:       Weight change:   Intake/Output Summary (Last 24 hours) at 11/03/2021 0908 Last data filed at 11/03/2021 7824 Gross per 24 hour  Intake 150 ml  Output 1800 ml  Net -1650 ml    [facility]- MWF  3:45, 77 kg ( left at 78 on Friday) ,  L AVF , 15 gauge 400 BFR/500 DFR, 2/2.5 bath, no heparin, calcitriol 0.25 TIW, mircera 150 ( due 10/30), venofer 50 ( due 10/30)   Assessment/Plan:   # ESRD  - MWF schedule at [facility] usually  - HD Monday-  next due tomorrow via AVF  - Note that his EDW needs to be lowered to try to prevent fluid from re-accumulating-  may be difficult due to BP-  will use albumin here and increase midodrine to attempt more UF    # Bilateral pleural effusions  - s/p thoracentesis on right on 10/30-  removed 1500 - optimize volume with HD to prevent re-accumulation    # Gangrenous wound of right great toe with bacteremia  - Ortho has been consulted  - he is to be transferred to [facility]  -Put on vanc and cefepime    # Chronic hypotension - limits UF with HD - continue midodrine - will inc to 10 TID   # Anemia CKD - Hb 11.2- - acceptable-  no meds for now    # Metabolic bone disease  -  continue calcitriol TIW - looks like on phoslo as OP-  have added   Disposition - patient is to be transferred to [facility] for ortho; we will follow him there-  HD tomorrow either here or at [facility]: Basic Metabolic Panel: Recent Labs  Lab  11/01/21 0525 11/02/21 0604 11/03/21 0306  NA 134* 134* 132*  K 4.9 5.0 3.9  CL 96* 96* 95*  CO2 '25 25 27  '$ GLUCOSE 156* 174* 150*  BUN 49* 63* 40*  CREATININE 5.27* 6.21* 5.04*  CALCIUM 9.7 9.4 8.8*  PHOS 7.0* 7.5* 5.5*   Liver Function Tests: Recent Labs  Lab 10/30/2021 1553 11/01/21 0525 11/02/21 0604 11/03/21 0306  AST 57* 29  --   --   ALT 47* 34  --   --   ALKPHOS 112 92  --   --   BILITOT 1.6* 1.1  --   --   PROT 7.4 6.2*  --   --   ALBUMIN 3.3* 2.7* 2.6* 2.6*   No results for input(s): "LIPASE", "AMYLASE" in the last 168 hours. No results for input(s): "AMMONIA" in the last 168 hours. CBC: Recent Labs  Lab 10/24/2021 1553 11/01/21 0525 11/02/21 0604 11/03/21 0306  WBC 19.6* 16.0* 15.8* 12.9*  NEUTROABS 17.6*  --  13.7*  11.0*  HGB 12.6* 11.3* 11.1* 11.2*  HCT 40.1 35.3* 34.7* 35.2*  MCV 91.1 88.7 89.2 90.0  PLT 185 159 155 126*   Cardiac Enzymes: No results for input(s): "CKTOTAL", "CKMB", "CKMBINDEX", "TROPONINI" in the last 168 hours. CBG: Recent Labs  Lab 11/02/21 0536 11/02/21 0744 11/02/21 1732 11/02/21 2117 11/03/21 0752  GLUCAP 164* 174* 171* 158* 120*    Iron Studies: No results for input(s): "IRON", "TIBC", "TRANSFERRIN", "FERRITIN" in the last 72 hours. Studies/Results: US THORACENTESIS ASP PLEURAL SPACE W/IMG GUIDE  Result Date: 11/02/2021 INDICATION: BILATERAL pleural effusions larger on RIGHT, shortness of breath, history end-stage renal disease on dialysis, diabetes mellitus, hypertension EXAM: ULTRASOUND GUIDED DIAGNOSTIC AND THERAPEUTIC RIGHT THORACENTESIS MEDICATIONS: None. COMPLICATIONS: None immediate. PROCEDURE: An ultrasound guided thoracentesis was thoroughly discussed with the patient and questions answered. The benefits, risks, alternatives and complications were also discussed. The patient understands and wishes to proceed with the procedure. Written consent was obtained. Ultrasound was performed to localize and mark an adequate  pocket of fluid in the RIGHT chest. The area was then prepped and draped in the normal sterile fashion. 1% Lidocaine was used for local anesthesia. Under ultrasound guidance a 6 Fr Safe-T-Centesis catheter was introduced. Thoracentesis was performed. The catheter was removed and a dressing applied. FINDINGS: A total of approximately 1.5 L of yellow fluid was removed. Samples were sent to the laboratory as requested by the clinical team. IMPRESSION: Successful ultrasound guided RIGHT thoracentesis yielding 1.5 L of pleural fluid. Electronically Signed   By: Lavonia Dana M.D.   On: 11/02/2021 10:31   DG Chest 1 View  Result Date: 11/02/2021 CLINICAL DATA:  Status post left thoracentesis. EXAM: CHEST  1 VIEW COMPARISON:  October 31, 2021. FINDINGS: No pneumothorax is noted status post left thoracentesis. Left pleural effusion appears to be smaller. Mild bibasilar subsegmental atelectasis is noted. IMPRESSION: No pneumothorax status post left thoracentesis. Electronically Signed   By: Marijo Conception M.D.   On: 11/02/2021 09:41   ECHOCARDIOGRAM COMPLETE  Result Date: 11/01/2021    ECHOCARDIOGRAM REPORT   Patient Name:   Paul Gonzalez Date of Exam: 11/01/2021 Medical Rec #:  [id number]       Height:       69.0 in Accession #:    [id number]      Weight:       170.0 lb Date of Birth:  10-12-1965       BSA:          1.928 m Patient Age:    56 years        BP:           98/56 mmHg Patient Gender: M               HR:           88 bpm. Exam Location:  [facility] Procedure: 2D Echo, Cardiac Doppler and Color Doppler Indications:    CHF-Acute Systolic Y60.63  History:        Patient has prior history of Echocardiogram examinations, most                 recent 03/19/2021. Risk Factors:Hypertension, Diabetes and                 Dyslipidemia. ESRD (end stage renal disease) on dialysis.                 Elevated brain natriuretic peptide (BNP) level. Pleural  effusion, bilateral.  Sonographer:    Alvino Chapel  RCS Referring Phys: [id number] OLADAPO ADEFESO IMPRESSIONS  1. Left ventricular ejection fraction, by estimation, is 25 to 30%. The left ventricle has severely decreased function. The left ventricle demonstrates global hypokinesis. Left ventricular diastolic parameters are consistent with Grade III diastolic dysfunction (restrictive).  2. Right ventricular systolic function is mildly reduced. The right ventricular size is severely enlarged. There is moderately elevated pulmonary artery systolic pressure. The estimated right ventricular systolic pressure is 16.0 mmHg.  3. Left atrial size was severely dilated.  4. Right atrial size was mildly dilated.  5. A small pericardial effusion is present. The pericardial effusion is circumferential.  6. The mitral valve is abnormal. Mild mitral valve regurgitation. Mild mitral stenosis. Moderate mitral annular calcification.  7. The aortic valve is calcified. Aortic valve regurgitation is moderate to severe. Moderate to severe aortic valve stenosis. GRadient of 36 mm Hg at max despite decreased strok volume index. DVI 0.20. Comparison(s): Significant changes from prior. Atempting to reach primary team. FINDINGS  Left Ventricle: Left ventricular ejection fraction, by estimation, is 25 to 30%. The left ventricle has severely decreased function. The left ventricle demonstrates global hypokinesis. The left ventricular internal cavity size was normal in size. There is no left ventricular hypertrophy. Left ventricular diastolic parameters are consistent with Grade III diastolic dysfunction (restrictive). Right Ventricle: The right ventricular size is severely enlarged. No increase in right ventricular wall thickness. Right ventricular systolic function is mildly reduced. There is moderately elevated pulmonary artery systolic pressure. The tricuspid regurgitant velocity is 2.82 m/s, and with an assumed right atrial pressure of 15 mmHg, the estimated right ventricular systolic pressure  is 10.9 mmHg. Left Atrium: Left atrial size was severely dilated. Right Atrium: Right atrial size was mildly dilated. Pericardium: A small pericardial effusion is present. The pericardial effusion is circumferential. Presence of epicardial fat layer. Mitral Valve: The mitral valve is abnormal. Moderate mitral annular calcification. Mild mitral valve regurgitation. Mild mitral valve stenosis. Tricuspid Valve: The tricuspid valve is normal in structure. Tricuspid valve regurgitation is mild . No evidence of tricuspid stenosis. Aortic Valve: The aortic valve is calcified. Aortic valve regurgitation is moderate to severe. Aortic regurgitation PHT measures 160 msec. Moderate to severe aortic stenosis is present. Aortic valve mean gradient measures 36.0 mmHg. Aortic valve peak gradient measures 59.9 mmHg. Aortic valve area, by VTI measures 0.54 cm. Pulmonic Valve: The pulmonic valve was grossly normal. Pulmonic valve regurgitation is trivial. No evidence of pulmonic stenosis. Aorta: The aortic root is normal in size and structure. IAS/Shunts: No atrial level shunt detected by color flow Doppler.  LEFT VENTRICLE PLAX 2D LVIDd:         5.50 cm LVIDs:         4.70 cm LV PW:         1.10 cm LV IVS:        1.00 cm LVOT diam:     1.90 cm LV SV:         46 LV SV Index:   24 LVOT Area:     2.84 cm  LV Volumes (MOD) LV vol d, MOD A2C: 177.0 ml LV vol d, MOD A4C: 211.0 ml LV vol s, MOD A2C: 116.0 ml LV vol s, MOD A4C: 133.0 ml LV SV MOD A2C:     61.0 ml LV SV MOD A4C:     211.0 ml LV SV MOD BP:      73.3 ml RIGHT VENTRICLE RV S  prime:     7.62 cm/s TAPSE (M-mode): 1.5 cm LEFT ATRIUM              Index        RIGHT ATRIUM           Index LA diam:        5.30 cm  2.75 cm/m   RA Area:     21.40 cm LA Vol (A2C):   148.0 ml 76.76 ml/m  RA Volume:   71.10 ml  36.88 ml/m LA Vol (A4C):   143.0 ml 74.17 ml/m LA Biplane Vol: 148.0 ml 76.76 ml/m  AORTIC VALVE AV Area (Vmax):    0.55 cm AV Area (Vmean):   0.64 cm AV Area (VTI):      0.54 cm AV Vmax:           387.00 cm/s AV Vmean:          232.500 cm/s AV VTI:            0.863 m AV Peak Grad:      59.9 mmHg AV Mean Grad:      36.0 mmHg LVOT Vmax:         75.70 cm/s LVOT Vmean:        52.500 cm/s LVOT VTI:          0.164 m LVOT/AV VTI ratio: 0.19 AI PHT:            160 msec  AORTA Ao Root diam: 3.10 cm MITRAL VALVE                TRICUSPID VALVE MV Area (PHT): 6.17 cm     TR Peak grad:   31.8 mmHg MV Decel Time: 123 msec     TR Vmax:        282.00 cm/s MR Peak grad: 81.0 mmHg MR Mean grad: 49.0 mmHg     SHUNTS MR Vmax:      450.00 cm/s   Systemic VTI:  0.16 m MR Vmean:     322.0 cm/s    Systemic Diam: 1.90 cm MV E velocity: 114.00 cm/s MV A velocity: 34.70 cm/s MV E/A ratio:  3.29 Rudean Haskell MD Electronically signed by Rudean Haskell MD Signature Date/Time: 11/01/2021/1:03:57 PM    Final    Medications: Infusions:  anticoagulant sodium citrate     piperacillin-tazobactam (ZOSYN)  IV Stopped (11/03/21 2458)    Scheduled Medications:  atorvastatin  20 mg Oral Daily   calcitRIOL  0.25 mcg Oral Q M,W,F-HD   calcium acetate  667 mg Oral TID WC   Chlorhexidine Gluconate Cloth  6 each Topical Q0600   feeding supplement (GLUCERNA SHAKE)  237 mL Oral TID BM   ferrous sulfate  325 mg Oral Q breakfast   insulin aspart  0-5 Units Subcutaneous QHS   insulin aspart  0-6 Units Subcutaneous TID WC   insulin glargine-yfgn  8 Units Subcutaneous QHS   midodrine  10 mg Oral TID WC   pantoprazole  40 mg Oral Daily    have reviewed scheduled and prn medications.  Physical Exam: General:  thin, pale, NAD-  not very talkative Heart:RRR Lungs:dec BS at bases Abdomen:soft,non tender Extremities: pitting edema to dep areas Dialysis Access: left AVF    11/03/2021,9:08 AM  LOS: 3 days

## 2021-11-03 NOTE — Progress Notes (Addendum)
Rounding Note    Patient Name: Paul Gonzalez Date of Encounter: 11/03/2021  Accident Cardiologist: Larae Grooms, MD   Subjective   The patient reports breathing is better after thoracentesis. No chest pain reported. Still awaiting transfer to [facility].  Inpatient Medications    Scheduled Meds:  atorvastatin  20 mg Oral Daily   calcitRIOL  0.25 mcg Oral Q M,W,F-HD   calcium acetate  667 mg Oral TID WC   Chlorhexidine Gluconate Cloth  6 each Topical Q0600   feeding supplement (GLUCERNA SHAKE)  237 mL Oral TID BM   ferrous sulfate  325 mg Oral Q breakfast   insulin aspart  0-5 Units Subcutaneous QHS   insulin aspart  0-6 Units Subcutaneous TID WC   insulin glargine-yfgn  8 Units Subcutaneous QHS   midodrine  10 mg Oral TID WC   pantoprazole  40 mg Oral Daily   Continuous Infusions:  anticoagulant sodium citrate     piperacillin-tazobactam (ZOSYN)  IV Stopped (11/03/21 0651)   PRN Meds: acetaminophen **OR** acetaminophen, alteplase, anticoagulant sodium citrate, heparin, HYDROmorphone (DILAUDID) injection, lidocaine (PF), lidocaine-prilocaine, ondansetron **OR** ondansetron (ZOFRAN) IV, oxyCODONE, pentafluoroprop-tetrafluoroeth   Vital Signs    Vitals:   11/03/21 0530 11/03/21 0600 11/03/21 0730 11/03/21 0800  BP: (!) 97/58 (!) 93/52 (!) 97/52 (!) 98/52  Pulse:   83 80  Resp: (!) 21 (!) 23 (!) 22 (!) 24  Temp:   98.7 F (37.1 C)   TempSrc:   Oral   SpO2:  98% 100% 100%  Weight:      Height:        Intake/Output Summary (Last 24 hours) at 11/03/2021 0858 Last data filed at 11/03/2021 0651 Gross per 24 hour  Intake 150 ml  Output 1800 ml  Net -1650 ml      11/02/2021    2:39 PM 11/02/2021   10:46 AM 10/16/2021    2:23 PM  Last 3 Weights  Weight (lbs) 132 lb 4.4 oz 134 lb 7.7 oz 170 lb  Weight (kg) 60 kg 61 kg 77.111 kg      Telemetry    NSR HR 80s, PVCs - Personally Reviewed  ECG    No new - Personally Reviewed  Physical Exam    GEN: No acute distress.   Neck: No JVD Cardiac: RRR, no murmurs, rubs, or gallops.  Respiratory: diminished at bases GI: Soft, nontender, non-distended  MS: No edema; No deformity. Neuro:  Nonfocal  Psych: Normal affect   Labs    Chemistry Recent Labs  Lab 10/28/2021 1553 11/01/21 0525 11/02/21 0604 11/03/21 0306  NA 135 134* 134* 132*  K 4.9 4.9 5.0 3.9  CL 93* 96* 96* 95*  CO2 '25 25 25 27  '$ GLUCOSE 173* 156* 174* 150*  BUN 45* 49* 63* 40*  CREATININE 5.14* 5.27* 6.21* 5.04*  CALCIUM 10.0 9.7 9.4 8.8*  MG  --  2.1  --   --   PROT 7.4 6.2*  --   --   ALBUMIN 3.3* 2.7* 2.6* 2.6*  AST 57* 29  --   --   ALT 47* 34  --   --   ALKPHOS 112 92  --   --   BILITOT 1.6* 1.1  --   --   GFRNONAA 12* 12* 10* 13*  ANIONGAP 17* '13 13 10    '$ Hematology Recent Labs  Lab 11/01/21 0525 11/02/21 0604 11/03/21 0306  WBC 16.0* 15.8* 12.9*  RBC 3.98* 3.89* 3.91*  HGB  11.3* 11.1* 11.2*  HCT 35.3* 34.7* 35.2*  MCV 88.7 89.2 90.0  MCH 28.4 28.5 28.6  MCHC 32.0 32.0 31.8  RDW 20.7* 20.6* 20.8*  PLT 159 155 126*   BNP Recent Labs  Lab 10/21/2021 1553  BNP >4,500.0*     Radiology    US THORACENTESIS ASP PLEURAL SPACE W/IMG GUIDE  Result Date: 11/02/2021 INDICATION: BILATERAL pleural effusions larger on RIGHT, shortness of breath, history end-stage renal disease on dialysis, diabetes mellitus, hypertension EXAM: ULTRASOUND GUIDED DIAGNOSTIC AND THERAPEUTIC RIGHT THORACENTESIS MEDICATIONS: None. COMPLICATIONS: None immediate. PROCEDURE: An ultrasound guided thoracentesis was thoroughly discussed with the patient and questions answered. The benefits, risks, alternatives and complications were also discussed. The patient understands and wishes to proceed with the procedure. Written consent was obtained. Ultrasound was performed to localize and mark an adequate pocket of fluid in the RIGHT chest. The area was then prepped and draped in the normal sterile fashion. 1% Lidocaine was used for  local anesthesia. Under ultrasound guidance a 6 Fr Safe-T-Centesis catheter was introduced. Thoracentesis was performed. The catheter was removed and a dressing applied. FINDINGS: A total of approximately 1.5 L of yellow fluid was removed. Samples were sent to the laboratory as requested by the clinical team. IMPRESSION: Successful ultrasound guided RIGHT thoracentesis yielding 1.5 L of pleural fluid. Electronically Signed   By: Lavonia Dana M.D.   On: 11/02/2021 10:31   DG Chest 1 View  Result Date: 11/02/2021 CLINICAL DATA:  Status post left thoracentesis. EXAM: CHEST  1 VIEW COMPARISON:  October 31, 2021. FINDINGS: No pneumothorax is noted status post left thoracentesis. Left pleural effusion appears to be smaller. Mild bibasilar subsegmental atelectasis is noted. IMPRESSION: No pneumothorax status post left thoracentesis. Electronically Signed   By: Marijo Conception M.D.   On: 11/02/2021 09:41   ECHOCARDIOGRAM COMPLETE  Result Date: 11/01/2021    ECHOCARDIOGRAM REPORT   Patient Name:   Paul Gonzalez Date of Exam: 11/01/2021 Medical Rec #:  [id number]       Height:       69.0 in Accession #:    [id number]      Weight:       170.0 lb Date of Birth:  1965/04/06       BSA:          1.928 m Patient Age:    56 years        BP:           98/56 mmHg Patient Gender: M               HR:           88 bpm. Exam Location:  [facility] Procedure: 2D Echo, Cardiac Doppler and Color Doppler Indications:    CHF-Acute Systolic L87.56  History:        Patient has prior history of Echocardiogram examinations, most                 recent 03/19/2021. Risk Factors:Hypertension, Diabetes and                 Dyslipidemia. ESRD (end stage renal disease) on dialysis.                 Elevated brain natriuretic peptide (BNP) level. Pleural                 effusion, bilateral.  Sonographer:    Alvino Chapel RCS Referring Phys: [id number] OLADAPO ADEFESO IMPRESSIONS  1. Left ventricular ejection fraction,  by estimation, is 25 to 30%. The  left ventricle has severely decreased function. The left ventricle demonstrates global hypokinesis. Left ventricular diastolic parameters are consistent with Grade III diastolic dysfunction (restrictive).  2. Right ventricular systolic function is mildly reduced. The right ventricular size is severely enlarged. There is moderately elevated pulmonary artery systolic pressure. The estimated right ventricular systolic pressure is 12.7 mmHg.  3. Left atrial size was severely dilated.  4. Right atrial size was mildly dilated.  5. A small pericardial effusion is present. The pericardial effusion is circumferential.  6. The mitral valve is abnormal. Mild mitral valve regurgitation. Mild mitral stenosis. Moderate mitral annular calcification.  7. The aortic valve is calcified. Aortic valve regurgitation is moderate to severe. Moderate to severe aortic valve stenosis. GRadient of 36 mm Hg at max despite decreased strok volume index. DVI 0.20. Comparison(s): Significant changes from prior. Atempting to reach primary team. FINDINGS  Left Ventricle: Left ventricular ejection fraction, by estimation, is 25 to 30%. The left ventricle has severely decreased function. The left ventricle demonstrates global hypokinesis. The left ventricular internal cavity size was normal in size. There is no left ventricular hypertrophy. Left ventricular diastolic parameters are consistent with Grade III diastolic dysfunction (restrictive). Right Ventricle: The right ventricular size is severely enlarged. No increase in right ventricular wall thickness. Right ventricular systolic function is mildly reduced. There is moderately elevated pulmonary artery systolic pressure. The tricuspid regurgitant velocity is 2.82 m/s, and with an assumed right atrial pressure of 15 mmHg, the estimated right ventricular systolic pressure is 51.7 mmHg. Left Atrium: Left atrial size was severely dilated. Right Atrium: Right atrial size was mildly dilated. Pericardium:  A small pericardial effusion is present. The pericardial effusion is circumferential. Presence of epicardial fat layer. Mitral Valve: The mitral valve is abnormal. Moderate mitral annular calcification. Mild mitral valve regurgitation. Mild mitral valve stenosis. Tricuspid Valve: The tricuspid valve is normal in structure. Tricuspid valve regurgitation is mild . No evidence of tricuspid stenosis. Aortic Valve: The aortic valve is calcified. Aortic valve regurgitation is moderate to severe. Aortic regurgitation PHT measures 160 msec. Moderate to severe aortic stenosis is present. Aortic valve mean gradient measures 36.0 mmHg. Aortic valve peak gradient measures 59.9 mmHg. Aortic valve area, by VTI measures 0.54 cm. Pulmonic Valve: The pulmonic valve was grossly normal. Pulmonic valve regurgitation is trivial. No evidence of pulmonic stenosis. Aorta: The aortic root is normal in size and structure. IAS/Shunts: No atrial level shunt detected by color flow Doppler.  LEFT VENTRICLE PLAX 2D LVIDd:         5.50 cm LVIDs:         4.70 cm LV PW:         1.10 cm LV IVS:        1.00 cm LVOT diam:     1.90 cm LV SV:         46 LV SV Index:   24 LVOT Area:     2.84 cm  LV Volumes (MOD) LV vol d, MOD A2C: 177.0 ml LV vol d, MOD A4C: 211.0 ml LV vol s, MOD A2C: 116.0 ml LV vol s, MOD A4C: 133.0 ml LV SV MOD A2C:     61.0 ml LV SV MOD A4C:     211.0 ml LV SV MOD BP:      73.3 ml RIGHT VENTRICLE RV S prime:     7.62 cm/s TAPSE (M-mode): 1.5 cm LEFT ATRIUM  Index        RIGHT ATRIUM           Index LA diam:        5.30 cm  2.75 cm/m   RA Area:     21.40 cm LA Vol (A2C):   148.0 ml 76.76 ml/m  RA Volume:   71.10 ml  36.88 ml/m LA Vol (A4C):   143.0 ml 74.17 ml/m LA Biplane Vol: 148.0 ml 76.76 ml/m  AORTIC VALVE AV Area (Vmax):    0.55 cm AV Area (Vmean):   0.64 cm AV Area (VTI):     0.54 cm AV Vmax:           387.00 cm/s AV Vmean:          232.500 cm/s AV VTI:            0.863 m AV Peak Grad:      59.9 mmHg AV  Mean Grad:      36.0 mmHg LVOT Vmax:         75.70 cm/s LVOT Vmean:        52.500 cm/s LVOT VTI:          0.164 m LVOT/AV VTI ratio: 0.19 AI PHT:            160 msec  AORTA Ao Root diam: 3.10 cm MITRAL VALVE                TRICUSPID VALVE MV Area (PHT): 6.17 cm     TR Peak grad:   31.8 mmHg MV Decel Time: 123 msec     TR Vmax:        282.00 cm/s MR Peak grad: 81.0 mmHg MR Mean grad: 49.0 mmHg     SHUNTS MR Vmax:      450.00 cm/s   Systemic VTI:  0.16 m MR Vmean:     322.0 cm/s    Systemic Diam: 1.90 cm MV E velocity: 114.00 cm/s MV A velocity: 34.70 cm/s MV E/A ratio:  3.29 Rudean Haskell MD Electronically signed by Rudean Haskell MD Signature Date/Time: 11/01/2021/1:03:57 PM    Final     Cardiac Studies   Echo 11/01/21 1. Left ventricular ejection fraction, by estimation, is 25 to 30%. The  left ventricle has severely decreased function. The left ventricle  demonstrates global hypokinesis. Left ventricular diastolic parameters are  consistent with Grade III diastolic  dysfunction (restrictive).   2. Right ventricular systolic function is mildly reduced. The right  ventricular size is severely enlarged. There is moderately elevated  pulmonary artery systolic pressure. The estimated right ventricular  systolic pressure is 62.9 mmHg.   3. Left atrial size was severely dilated.   4. Right atrial size was mildly dilated.   5. A small pericardial effusion is present. The pericardial effusion is  circumferential.   6. The mitral valve is abnormal. Mild mitral valve regurgitation. Mild  mitral stenosis. Moderate mitral annular calcification.   7. The aortic valve is calcified. Aortic valve regurgitation is moderate  to severe. Moderate to severe aortic valve stenosis. GRadient of 36 mm Hg  at max despite decreased strok volume index. DVI 0.20.   Comparison(s): Significant changes from prior. Atempting to reach primary  team.    Echo TEE 03/19/21  1. Left ventricular ejection  fraction, by estimation, is 55 to 60%. The  left ventricle has normal function.   2. Right ventricular systolic function is normal. The right ventricular  size is normal.   3.  No left atrial/left atrial appendage thrombus was detected. The LAA  emptying velocity was 100 cm/s.   4. The mitral valve is abnormal. Mild mitral valve regurgitation. No  evidence of mitral stenosis.   5. Bulky calcification of the left corronary and noncoronary cusps simlar  in appearance to 09/04/19 TTE study. Unable to obtain accurate gradient  across valve. . The aortic valve is abnormal. Aortic valve regurgitation  is not visualized.   Conclusion(s)/Recommendation(s): No evidence of vegetation/infective  endocarditis on this transesophageael echocardiogram.    Echo 03/17/21  1. Study stopped early due to patient discomfort.   2. Left ventricular ejection fraction, by estimation, is 60 to 65%. The  left ventricle has normal function. The left ventricle has no regional  wall motion abnormalities. There is moderate concentric left ventricular  hypertrophy. Left ventricular  diastolic parameters were normal.   3. Right ventricular systolic function is normal. The right ventricular  size is normal. There is normal pulmonary artery systolic pressure.   4. Left atrial size was moderately dilated.   5. The mitral valve is abnormal. Trivial mitral valve regurgitation.  Moderate mitral annular calcification.   6. The aortic valve has an indeterminant number of cusps but appears  tricuspid. There is moderate calcification of the aortic valve. There is  moderate thickening of the aortic valve. Aortic valve regurgitation is not  visualized. Moderate aortic valve  stenosis. AVA 1.0cm2 by continuity. Aortic valve mean gradient 109mHg.  Aortic valve Vmax measures 3.92 m/s. DI 0.28.   Comparison(s): Compared to prior TTE in 08/2019, the aortic stenosis is  now moderate (previously mild to moderate with mean gradient  167mg (now  3079m)).   Patient Profile     55 46o. male with a hx of aortic stenosis, hypertension, diabetes type 2, end-stage renal disease on hemodialysis, depression, hyperlipidemia, aortic stenosis, history of MSSA bacteremia who is being seen 11/02/2021 for the evaluation of reduced EF/heart failure.  Assessment & Plan    Cardiomyopathy LVEF 25 to 30%38%ute systolic and diastolic heart failure -Echo this admission showed newly reduced LVEF 25 to 30%18%rade 3 diastolic dysfunction, mildly reduced RV function, severely dilated left atrium, mildly dilated right atrium, small pericardial effusion, mild MR moderate to severe AI, moderate to severe aortic stenosis/AI -Reduced EF in the setting of sepsis, bilateral pleural effusions, moderate to severe AS/AI -No prior ischemic work-up -BNP elevated up to 4500 -PTA midodrine 5 mg daily. patient was started on midodrine 10 mg TID -PTA Lasix 80 mg daily, he makes minimal urine -IV Lasix held for low pressures> pressure are still low -Volume management per HD.  If blood pressure improves can consider IV Lasix.   Bilateral pleural effusions - s/p right thoracentesis yielding 1.5L yellow colored fluid -Continue volume management with HD   Aortic valve disease -Echo this admission showed LVEF 25 to 30%, moderate to severe AI, and moderate to severe AS, gradient of 36 mmHg. -We will likely need TEE   Sepsis secondary to gangrene right toe with cellulitis Lactic acidosis -Lactic acidosis improving, down to 2.1 -Antibiotics per primary team -[facility] -He is on schedule Monday Wednesday Friday -Nephrology is following  For questions or updates, please contact [facility] consult [email or web address] for contact info under        Signed, Cadence H FNinfa MeekerA-C  11/03/2021, 8:58 AM       Attending note:  Patient seen and examined.  Still  in [place] ER awaiting transfer to [facility] on hospitalist service.  See consultation note from yesterday.  He did undergo successful right-sided thoracentesis with 1.5 L fluid removal.  Feels less short of breath today.  Also getting hemodialysis under the direction of nephrology.  Recent systolics in the 38V, heart rate in the 80s and in sinus rhythm by telemetry which I personally reviewed.  He is on Proamatine and Zosyn, afebrile.  Pertinent lab work includes potassium 3.9, BUN 40, creatinine 5.04, WBC 12.9, hemoglobin 11.2, platelets 126.  Pleural fluid with negative Gram stain.  Complex situation, patient awaits transfer to [facility] He has sepsis and right great toe cellulitis with gangrene and plan for amputation per review of the notes.  He is on broad-spectrum antibiotics at this time.  Has bacteremia with Staphylococcus aureus and also Bacteroides fragilis.  With progressive aortic valve disease in particular new moderate to severe aortic regurgitation, he should undergo a TEE to better exclude endocarditis.  Whether or not his cardiomyopathy is secondary to progressive aortic valve disease is unclear, but certainly possible since his last imaging was back in March.  Would not pursue invasive cardiac work-up at this time in light of active bacteremia.  His general surgical risk is high based on active comorbidities and this is largely an unmodifiable risk in the short-term.  Bigger question will be whether he needs to be considered for surgical management of his aortic valve particularly if he does have evidence of endocarditis, may need TCTS consultation depending on TEE results.  Our cardiology service will continue to follow him at [facility].  Satira Sark, M.D., F.A.C.C.

## 2021-11-03 NOTE — Progress Notes (Signed)
Pharmacy notified x3 for missing 2000 zosyn. New orders received retiming zosyn with pharmacy confirming that a bag will be sent.

## 2021-11-03 NOTE — Progress Notes (Addendum)
PROGRESS NOTE   Paul Gonzalez  TIW:[id number] DOB: 26-Oct-1965 DOA: 10/17/2021 PCP: Coral Spikes, DO   Chief Complaint  Patient presents with   Shortness of Breath   Level of care: Telemetry Medical  Brief Admission History:  56 y.o. male with medical history significant of Hypertension, hyperlipidemia, ESRD on HD (MWF), anemia of chronic disease, diabetic retinopathy with vision loss who presents to the emergency department due to worsening shortness of breath that has been ongoing for about 2 weeks, this was associated with nonproductive cough.  He  also complained of bilateral diabetic foot ulcers and states that he has not been able to get an appointment with a podiatrist.  He denies chest pain, fever, chills, nausea, vomiting, diarrhea or constipation.   ED Course:  In the emergency department, he was intermittently tachypneic, BP was soft at 105/52, other vital signs were within normal range.  Work-up in the ED showed leukocytosis, normocytic anemia, BMP showed sodium 135, potassium 4.9, chloride 93, bicarb 25, glucose 173, BUN 45, creatinine 5.14, albumin 3.3, AST 57, ALT 47, total bilirubin 1.6.  Anion gap 17, lactic acid 2.4 > 2.8, BNP > 4,500.  Influenza A, B, SARS coronavirus 2 was negative.  Right foot x-ray showed no fracture or dislocation of the right foot.  No radiographic findings of osteomyelitis.  Soft tissue wound over the medial great toe.  Soft tissue edema of the forefoot.  Chest x-ray showed cardiomegaly without failure.  Bilateral pleural effusion and associated airspace disease, likely atelectasis, right greater than left.  He was treated with IV ceftriaxone, vancomycin and azithromycin.  Midodrine was given.  IV hydration was provided.  Hospitalist was asked to admit patient for further evaluation and management.  Orthopedic surgeon (Dr. Erlinda Hong) was consulted and patient will be seen when he arrives at [facility].   Assessment and Plan:  Sepsis secondary to gangrene  right toe with cellulitis - orthopedics consulted Dr Erlinda Hong who will see patient at [facility] - pt will require amputation of gangrenous tissue - continue antibiotics for B fragilis coverage with IV zosyn  Volume overload - fluid management with hemodialysis  - midodrine dose increased due to soft BPs limiting volume removal for HD  New finding of Cardiomyopathy  HFrEF - echo with findings of change in EF now down to 20-25% - inpatient cardiology consultation requested - pt may require TEE to better assess for valvular disease  - appreciate cardiology team recs; they will follow him at [facility]   B Fragilis Bacteremia - discussed with pharm D and changed to IV Zosyn for better coverage  ESRD on HD - He is on schedule with MWF HD treatments - nephrology was consulted regarding inpatient HD treatments - electrolytes are stable at this time  - nephrology team will follow him when he arrives at [facility]  Lactic acidosis Lactate down to 2.1 now  Bilateral Pleural Effusions R>L - thoracentesis ordered for large right effusion, completed 10/30 with 1.5L removed - continue supportive care and volume management with HD for now - not currently with any symptoms of respiratory distress   Leukocytosis  - monitor CBC/diff as response to treating infection - follow blood cultures   Diabetes mellitus with neurological, renal, vascular complications - continue SSI coverage, daily lantus 8 units for basal coverage, frequent CBG monitoring CBG (last 3)  Recent Labs    11/02/21 1732 11/02/21 2117 11/03/21 0752  GLUCAP 171* 158* 120*   Chronic Hypotension  - we have resumed midodrine, dose increased  by nephrology due to soft BPs limiting HD volume removal   DVT prophylaxis: Amorita heparin (refused to take) Code Status: Full  Family Communication: wife bedside 10/29, 10/31 Disposition: Status is: Inpatient Remains inpatient appropriate because: IV antibiotics, intensity   Consultants:  Orthopedics Dr Erlinda Hong   Procedures: hemodialysis  Antimicrobials:  Vanc, cefepime  Subjective: Pt breathing better after thoracentesis   Objective: Vitals:   11/03/21 0530 11/03/21 0600 11/03/21 0730 11/03/21 0800  BP: (!) 97/58 (!) 93/52 (!) 97/52 (!) 98/52  Pulse:   83 80  Resp: (!) 21 (!) 23 (!) 22 (!) 24  Temp:   98.7 F (37.1 C)   TempSrc:   Oral   SpO2:  98% 100% 100%  Weight:      Height:        Intake/Output Summary (Last 24 hours) at 11/03/2021 1000 Last data filed at 11/03/2021 0651 Gross per 24 hour  Intake 150 ml  Output 1800 ml  Net -1650 ml   Filed Weights   10/13/2021 1423 11/02/21 1046 11/02/21 1439  Weight: 77.1 kg 61 kg 60 kg   Examination:  General exam: Appears calm and comfortable appears chronically ill.  Respiratory system: diminished BS RLL  Cardiovascular system: normal S1 & S2 heard. No JVD, murmurs, rubs, gallops or clicks. No pedal edema. Gastrointestinal system: Abdomen is nondistended, soft and nontender. No organomegaly or masses felt. Normal bowel sounds heard. Central nervous system: Alert and oriented. No focal neurological deficits. 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  Skin: bilateral foot ulceration lesions, discoloration/gangrene seen Psychiatry: Judgement and insight appear poor. Mood & affect flat.   Data Reviewed: I have personally reviewed following labs and imaging studies  CBC: Recent Labs  Lab 10/27/2021 1553 11/01/21 0525 11/02/21 0604 11/03/21 0306  WBC 19.6* 16.0* 15.8* 12.9*  NEUTROABS 17.6*  --  13.7* 11.0*  HGB 12.6* 11.3* 11.1* 11.2*  HCT 40.1 35.3* 34.7* 35.2*  MCV 91.1 88.7 89.2 90.0  PLT 185 159 155 126*    Basic Metabolic Panel: Recent Labs  Lab 10/10/2021 1553 11/01/21 0525 11/02/21 0604 11/03/21 0306  NA 135 134* 134* 132*  K 4.9 4.9 5.0 3.9  CL 93* 96* 96* 95*  CO2 '25 25 25 27  '$ GLUCOSE 173* 156* 174* 150*  BUN 45* 49* 63* 40*  CREATININE 5.14* 5.27* 6.21* 5.04*   CALCIUM 10.0 9.7 9.4 8.8*  MG  --  2.1  --   --   PHOS  --  7.0* 7.5* 5.5*    CBG: Recent Labs  Lab 11/02/21 0536 11/02/21 0744 11/02/21 1732 11/02/21 2117 11/03/21 0752  GLUCAP 164* 174* 171* 158* 120*    Recent Results (from the past 240 hour(s))  Resp Panel by RT-PCR (Flu A&B, Covid) Anterior Nasal Swab     Status: None   Collection Time: 10/04/2021  3:22 PM   Specimen: Anterior Nasal Swab  Result Value Ref Range Status   SARS Coronavirus 2 by RT PCR NEGATIVE NEGATIVE Final    Comment: (NOTE) SARS-CoV-2 target nucleic acids are NOT DETECTED.  The SARS-CoV-2 RNA is generally detectable in upper respiratory specimens during the acute phase of infection. The lowest concentration of SARS-CoV-2 viral copies this assay can detect is 138 copies/mL. A negative result does not preclude SARS-Cov-2 infection and should not be used as the sole basis for treatment or other patient management decisions. A negative result may occur with  improper specimen collection/handling, submission of specimen other than nasopharyngeal  swab, presence of viral mutation(s) within the areas targeted by this assay, and inadequate number of viral copies(<138 copies/mL). A negative result must be combined with clinical observations, patient history, and epidemiological information. The expected result is Negative.  Fact Sheet for Patients:  EntrepreneurPulse.com.au  Fact Sheet for Healthcare Providers:  IncredibleEmployment.be  This test is no t yet approved or cleared by the [place] FDA and  has been authorized for detection and/or diagnosis of SARS-CoV-2 by FDA under an Emergency Use Authorization (EUA). This EUA will remain  in effect (meaning this test can be used) for the duration of the COVID-19 declaration under Section 564(b)(1) of the Act, 21 U.S.C.section 360bbb-3(b)(1), unless the authorization is terminated  or revoked sooner.        Influenza A by PCR NEGATIVE NEGATIVE Final   Influenza B by PCR NEGATIVE NEGATIVE Final    Comment: (NOTE) The Xpert Xpress SARS-CoV-2/FLU/RSV plus assay is intended as an aid in the diagnosis of influenza from Nasopharyngeal swab specimens and should not be used as a sole basis for treatment. Nasal washings and aspirates are unacceptable for Xpert Xpress SARS-CoV-2/FLU/RSV testing.  Fact Sheet for Patients: EntrepreneurPulse.com.au  Fact Sheet for Healthcare Providers: IncredibleEmployment.be  This test is not yet approved or cleared by the [place] FDA and has been authorized for detection and/or diagnosis of SARS-CoV-2 by FDA under an Emergency Use Authorization (EUA). This EUA will remain in effect (meaning this test can be used) for the duration of the COVID-19 declaration under Section 564(b)(1) of the Act, 21 U.S.C. section 360bbb-3(b)(1), unless the authorization is terminated or revoked.  Performed at [facility], [address] [place], [place] [address]   Blood Culture (routine x 2)     Status: None (Preliminary result)   Collection Time: 10/22/2021  3:53 PM   Specimen: BLOOD RIGHT ARM  Result Value Ref Range Status   Specimen Description BLOOD RIGHT ARM  Final   Special Requests   Final    BOTTLES DRAWN AEROBIC AND ANAEROBIC Blood Culture adequate volume   Culture   Final    NO GROWTH 3 DAYS Performed at [facility], [address] [place], [place] [address]    Report Status PENDING  Incomplete  Blood Culture (routine x 2)     Status: None (Preliminary result)   Collection Time: 10/04/2021  3:54 PM   Specimen: Right Antecubital; Blood  Result Value Ref Range Status   Specimen Description   Final    RIGHT ANTECUBITAL Performed at [facility], [address], [place], [place] [address]    Special Requests   Final    BOTTLES DRAWN AEROBIC AND ANAEROBIC Blood Culture results may not be optimal due to an excessive volume of  blood received in culture bottles Performed at [facility], [address] [place], [place] [address]    Culture  Setup Time   Final    GRAM NEGATIVE RODS Gram Stain Report Called to,Read Back By and Verified With:  J. LONG @ 1512 BY STEPHTR 11/01/21 CRITICAL RESULT CALLED TO, READ BACK BY AND VERIFIED WITH: E PREYER,RN'@0125'$  0/30/23 [place]    Culture   Final    CULTURE REINCUBATED FOR BETTER GROWTH Performed at [facility], [place] [address] [place], [place] [address]    Report Status PENDING  Incomplete  Blood Culture ID Panel (Reflexed)     Status: Abnormal   Collection Time: 10/25/2021  3:54 PM  Result Value Ref Range Status   Enterococcus faecalis  NOT DETECTED NOT DETECTED Final   Enterococcus Faecium NOT DETECTED NOT DETECTED Final   Listeria monocytogenes NOT DETECTED NOT DETECTED Final   Staphylococcus species NOT DETECTED NOT DETECTED Final   Staphylococcus aureus (BCID) NOT DETECTED NOT DETECTED Final   Staphylococcus epidermidis NOT DETECTED NOT DETECTED Final   Staphylococcus lugdunensis NOT DETECTED NOT DETECTED Final   Streptococcus species NOT DETECTED NOT DETECTED Final   Streptococcus agalactiae NOT DETECTED NOT DETECTED Final   Streptococcus pneumoniae NOT DETECTED NOT DETECTED Final   Streptococcus pyogenes NOT DETECTED NOT DETECTED Final   A.calcoaceticus-baumannii NOT DETECTED NOT DETECTED Final   Bacteroides fragilis DETECTED (A) NOT DETECTED Final    Comment: CRITICAL RESULT CALLED TO, READ BACK BY AND VERIFIED WITH: E PREYER,RN'@0125'$  11/02/21 [place]    Enterobacterales NOT DETECTED NOT DETECTED Final   Enterobacter cloacae complex NOT DETECTED NOT DETECTED Final   Escherichia coli NOT DETECTED NOT DETECTED Final   Klebsiella aerogenes NOT DETECTED NOT DETECTED Final   Klebsiella oxytoca NOT DETECTED NOT DETECTED Final   Klebsiella pneumoniae NOT DETECTED NOT DETECTED Final   Proteus species NOT DETECTED NOT DETECTED Final   Salmonella species NOT DETECTED NOT  DETECTED Final   Serratia marcescens NOT DETECTED NOT DETECTED Final   Haemophilus influenzae NOT DETECTED NOT DETECTED Final   Neisseria meningitidis NOT DETECTED NOT DETECTED Final   Pseudomonas aeruginosa NOT DETECTED NOT DETECTED Final   Stenotrophomonas maltophilia NOT DETECTED NOT DETECTED Final   Candida albicans NOT DETECTED NOT DETECTED Final   Candida auris NOT DETECTED NOT DETECTED Final   Candida glabrata NOT DETECTED NOT DETECTED Final   Candida krusei NOT DETECTED NOT DETECTED Final   Candida parapsilosis NOT DETECTED NOT DETECTED Final   Candida tropicalis NOT DETECTED NOT DETECTED Final   Cryptococcus neoformans/gattii NOT DETECTED NOT DETECTED Final    Comment: Performed at [facility], [address], [place], [place] [address]  Culture, body fluid w Gram Stain-bottle     Status: None (Preliminary result)   Collection Time: 11/02/21  8:54 AM   Specimen: Pleura  Result Value Ref Range Status   Specimen Description PLEURAL BOTTLES DRAWN AEROBIC AND ANAEROBIC  Final   Special Requests Blood Culture adequate volume  Final   Culture   Final    NO GROWTH < 24 HOURS Performed at [facility], [address] [place], [place] [address]    Report Status PENDING  Incomplete  Gram stain     Status: None   Collection Time: 11/02/21  8:54 AM   Specimen: Pleura  Result Value Ref Range Status   Specimen Description PLEURAL  Final   Special Requests NONE  Final   Gram Stain   Final    NO ORGANISMS SEEN WBC PRESENT, PREDOMINANTLY PMN CYTOSPIN SMEAR Performed at [facility], [address], [place], [place] [address]    Report Status 11/02/2021 FINAL  Final     Radiology Studies: US THORACENTESIS ASP PLEURAL SPACE W/IMG GUIDE  Result Date: 11/02/2021 INDICATION: BILATERAL pleural effusions larger on RIGHT, shortness of breath, history end-stage renal disease on dialysis, diabetes mellitus, hypertension EXAM: ULTRASOUND GUIDED DIAGNOSTIC AND THERAPEUTIC RIGHT  THORACENTESIS MEDICATIONS: None. COMPLICATIONS: None immediate. PROCEDURE: An ultrasound guided thoracentesis was thoroughly discussed with the patient and questions answered. The benefits, risks, alternatives and complications were also discussed. The patient understands and wishes to proceed with the procedure. Written consent was obtained. Ultrasound was performed to localize and mark an adequate pocket of fluid in the RIGHT  chest. The area was then prepped and draped in the normal sterile fashion. 1% Lidocaine was used for local anesthesia. Under ultrasound guidance a 6 Fr Safe-T-Centesis catheter was introduced. Thoracentesis was performed. The catheter was removed and a dressing applied. FINDINGS: A total of approximately 1.5 L of yellow fluid was removed. Samples were sent to the laboratory as requested by the clinical team. IMPRESSION: Successful ultrasound guided RIGHT thoracentesis yielding 1.5 L of pleural fluid. Electronically Signed   By: Lavonia Dana M.D.   On: 11/02/2021 10:31   DG Chest 1 View  Result Date: 11/02/2021 CLINICAL DATA:  Status post left thoracentesis. EXAM: CHEST  1 VIEW COMPARISON:  October 31, 2021. FINDINGS: No pneumothorax is noted status post left thoracentesis. Left pleural effusion appears to be smaller. Mild bibasilar subsegmental atelectasis is noted. IMPRESSION: No pneumothorax status post left thoracentesis. Electronically Signed   By: Marijo Conception M.D.   On: 11/02/2021 09:41   ECHOCARDIOGRAM COMPLETE  Result Date: 11/01/2021    ECHOCARDIOGRAM REPORT   Patient Name:   GABRIELLA GUILE Date of Exam: 11/01/2021 Medical Rec #:  [id number]       Height:       69.0 in Accession #:    [id number]      Weight:       170.0 lb Date of Birth:  Jun 04, 1965       BSA:          1.928 m Patient Age:    12 years        BP:           98/56 mmHg Patient Gender: M               HR:           88 bpm. Exam Location:  [facility] Procedure: 2D Echo, Cardiac Doppler and Color Doppler  Indications:    CHF-Acute Systolic D22.02  History:        Patient has prior history of Echocardiogram examinations, most                 recent 03/19/2021. Risk Factors:Hypertension, Diabetes and                 Dyslipidemia. ESRD (end stage renal disease) on dialysis.                 Elevated brain natriuretic peptide (BNP) level. Pleural                 effusion, bilateral.  Sonographer:    Alvino Chapel RCS Referring Phys: [id number] OLADAPO ADEFESO IMPRESSIONS  1. Left ventricular ejection fraction, by estimation, is 25 to 30%. The left ventricle has severely decreased function. The left ventricle demonstrates global hypokinesis. Left ventricular diastolic parameters are consistent with Grade III diastolic dysfunction (restrictive).  2. Right ventricular systolic function is mildly reduced. The right ventricular size is severely enlarged. There is moderately elevated pulmonary artery systolic pressure. The estimated right ventricular systolic pressure is 37.6 mmHg.  3. Left atrial size was severely dilated.  4. Right atrial size was mildly dilated.  5. A small pericardial effusion is present. The pericardial effusion is circumferential.  6. The mitral valve is abnormal. Mild mitral valve regurgitation. Mild mitral stenosis. Moderate mitral annular calcification.  7. The aortic valve is calcified. Aortic valve regurgitation is moderate to severe. Moderate to severe aortic valve stenosis. GRadient of 36 mm Hg at max despite decreased strok volume index. DVI 0.20. Comparison(s): Significant changes  from prior. Atempting to reach primary team. FINDINGS  Left Ventricle: Left ventricular ejection fraction, by estimation, is 25 to 30%. The left ventricle has severely decreased function. The left ventricle demonstrates global hypokinesis. The left ventricular internal cavity size was normal in size. There is no left ventricular hypertrophy. Left ventricular diastolic parameters are consistent with Grade III diastolic  dysfunction (restrictive). Right Ventricle: The right ventricular size is severely enlarged. No increase in right ventricular wall thickness. Right ventricular systolic function is mildly reduced. There is moderately elevated pulmonary artery systolic pressure. The tricuspid regurgitant velocity is 2.82 m/s, and with an assumed right atrial pressure of 15 mmHg, the estimated right ventricular systolic pressure is 54.6 mmHg. Left Atrium: Left atrial size was severely dilated. Right Atrium: Right atrial size was mildly dilated. Pericardium: A small pericardial effusion is present. The pericardial effusion is circumferential. Presence of epicardial fat layer. Mitral Valve: The mitral valve is abnormal. Moderate mitral annular calcification. Mild mitral valve regurgitation. Mild mitral valve stenosis. Tricuspid Valve: The tricuspid valve is normal in structure. Tricuspid valve regurgitation is mild . No evidence of tricuspid stenosis. Aortic Valve: The aortic valve is calcified. Aortic valve regurgitation is moderate to severe. Aortic regurgitation PHT measures 160 msec. Moderate to severe aortic stenosis is present. Aortic valve mean gradient measures 36.0 mmHg. Aortic valve peak gradient measures 59.9 mmHg. Aortic valve area, by VTI measures 0.54 cm. Pulmonic Valve: The pulmonic valve was grossly normal. Pulmonic valve regurgitation is trivial. No evidence of pulmonic stenosis. Aorta: The aortic root is normal in size and structure. IAS/Shunts: No atrial level shunt detected by color flow Doppler.  LEFT VENTRICLE PLAX 2D LVIDd:         5.50 cm LVIDs:         4.70 cm LV PW:         1.10 cm LV IVS:        1.00 cm LVOT diam:     1.90 cm LV SV:         46 LV SV Index:   24 LVOT Area:     2.84 cm  LV Volumes (MOD) LV vol d, MOD A2C: 177.0 ml LV vol d, MOD A4C: 211.0 ml LV vol s, MOD A2C: 116.0 ml LV vol s, MOD A4C: 133.0 ml LV SV MOD A2C:     61.0 ml LV SV MOD A4C:     211.0 ml LV SV MOD BP:      73.3 ml RIGHT VENTRICLE  RV S prime:     7.62 cm/s TAPSE (M-mode): 1.5 cm LEFT ATRIUM              Index        RIGHT ATRIUM           Index LA diam:        5.30 cm  2.75 cm/m   RA Area:     21.40 cm LA Vol (A2C):   148.0 ml 76.76 ml/m  RA Volume:   71.10 ml  36.88 ml/m LA Vol (A4C):   143.0 ml 74.17 ml/m LA Biplane Vol: 148.0 ml 76.76 ml/m  AORTIC VALVE AV Area (Vmax):    0.55 cm AV Area (Vmean):   0.64 cm AV Area (VTI):     0.54 cm AV Vmax:           387.00 cm/s AV Vmean:          232.500 cm/s AV VTI:  0.863 m AV Peak Grad:      59.9 mmHg AV Mean Grad:      36.0 mmHg LVOT Vmax:         75.70 cm/s LVOT Vmean:        52.500 cm/s LVOT VTI:          0.164 m LVOT/AV VTI ratio: 0.19 AI PHT:            160 msec  AORTA Ao Root diam: 3.10 cm MITRAL VALVE                TRICUSPID VALVE MV Area (PHT): 6.17 cm     TR Peak grad:   31.8 mmHg MV Decel Time: 123 msec     TR Vmax:        282.00 cm/s MR Peak grad: 81.0 mmHg MR Mean grad: 49.0 mmHg     SHUNTS MR Vmax:      450.00 cm/s   Systemic VTI:  0.16 m MR Vmean:     322.0 cm/s    Systemic Diam: 1.90 cm MV E velocity: 114.00 cm/s MV A velocity: 34.70 cm/s MV E/A ratio:  3.29 Rudean Haskell MD Electronically signed by Rudean Haskell MD Signature Date/Time: 11/01/2021/1:03:57 PM    Final     Scheduled Meds:  atorvastatin  20 mg Oral Daily   calcitRIOL  0.25 mcg Oral Q M,W,F-HD   calcium acetate  667 mg Oral TID WC   Chlorhexidine Gluconate Cloth  6 each Topical Q0600   Chlorhexidine Gluconate Cloth  6 each Topical Q0600   feeding supplement (GLUCERNA SHAKE)  237 mL Oral TID BM   ferrous sulfate  325 mg Oral Q breakfast   insulin aspart  0-5 Units Subcutaneous QHS   insulin aspart  0-6 Units Subcutaneous TID WC   insulin glargine-yfgn  8 Units Subcutaneous QHS   midodrine  10 mg Oral TID WC   pantoprazole  40 mg Oral Daily   Continuous Infusions:  anticoagulant sodium citrate     piperacillin-tazobactam (ZOSYN)  IV Stopped (11/03/21 0651)    LOS: 3 days    Time spent: 35 mins  Beatrix Breece Wynetta Emery, MD How to contact the [facility] Attending or Consulting provider [place] or covering provider during after hours [place], for this patient?  Check the care team in [facility] and look for a) attending/consulting [facility] provider listed and b) the [facility] team listed Log into [email or web address] and use [place]'s universal password to access. If you do not have the password, please contact the hospital operator. Locate the [facility] provider you are looking for under [facility] and page to a number that you can be directly reached. If you still have difficulty reaching the provider, please page the [facility] (Director on Call) for the Hospitalists listed on amion for assistance.  11/03/2021, 10:00 AM

## 2021-11-04 ENCOUNTER — Inpatient Hospital Stay (HOSPITAL_COMMUNITY): Payer: Medicare Other

## 2021-11-04 DIAGNOSIS — Z992 Dependence on renal dialysis: Secondary | ICD-10-CM

## 2021-11-04 DIAGNOSIS — E11628 Type 2 diabetes mellitus with other skin complications: Secondary | ICD-10-CM | POA: Diagnosis not present

## 2021-11-04 DIAGNOSIS — E11621 Type 2 diabetes mellitus with foot ulcer: Secondary | ICD-10-CM

## 2021-11-04 DIAGNOSIS — I96 Gangrene, not elsewhere classified: Secondary | ICD-10-CM

## 2021-11-04 DIAGNOSIS — I739 Peripheral vascular disease, unspecified: Secondary | ICD-10-CM

## 2021-11-04 DIAGNOSIS — E43 Unspecified severe protein-calorie malnutrition: Secondary | ICD-10-CM

## 2021-11-04 DIAGNOSIS — N186 End stage renal disease: Secondary | ICD-10-CM

## 2021-11-04 DIAGNOSIS — R7989 Other specified abnormal findings of blood chemistry: Secondary | ICD-10-CM | POA: Diagnosis not present

## 2021-11-04 DIAGNOSIS — J90 Pleural effusion, not elsewhere classified: Secondary | ICD-10-CM | POA: Diagnosis not present

## 2021-11-04 LAB — RENAL FUNCTION PANEL
Albumin: 1.7 g/dL — ABNORMAL LOW (ref 3.5–5.0)
Anion gap: 9 (ref 5–15)
BUN: 39 mg/dL — ABNORMAL HIGH (ref 6–20)
CO2: 20 mmol/L — ABNORMAL LOW (ref 22–32)
Calcium: 6.8 mg/dL — ABNORMAL LOW (ref 8.9–10.3)
Chloride: 108 mmol/L (ref 98–111)
Creatinine, Ser: 4.6 mg/dL — ABNORMAL HIGH (ref 0.61–1.24)
GFR, Estimated: 14 mL/min — ABNORMAL LOW (ref >60)
Glucose, Bld: 174 mg/dL — ABNORMAL HIGH (ref 70–99)
Phosphorus: 4.2 mg/dL (ref 2.5–4.6)
Potassium: 3.4 mmol/L — ABNORMAL LOW (ref 3.5–5.1)
Sodium: 137 mmol/L (ref 135–145)

## 2021-11-04 LAB — GLUCOSE, CAPILLARY
Glucose-Capillary: 178 mg/dL — ABNORMAL HIGH (ref 70–99)
Glucose-Capillary: 201 mg/dL — ABNORMAL HIGH (ref 70–99)
Glucose-Capillary: 149 mg/dL — ABNORMAL HIGH (ref 70–99)
Glucose-Capillary: 225 mg/dL — ABNORMAL HIGH (ref 70–99)
Glucose-Capillary: 221 mg/dL — ABNORMAL HIGH (ref 70–99)

## 2021-11-04 LAB — CBC WITH DIFFERENTIAL/PLATELET
Abs Immature Granulocytes: 0.06 10*3/uL (ref 0.00–0.07)
Basophils Absolute: 0.1 10*3/uL (ref 0.0–0.1)
Basophils Relative: 0 %
Eosinophils Absolute: 0.3 10*3/uL (ref 0.0–0.5)
Eosinophils Relative: 2 %
HCT: 27.1 % — ABNORMAL LOW (ref 39.0–52.0)
Hemoglobin: 8.6 g/dL — ABNORMAL LOW (ref 13.0–17.0)
Immature Granulocytes: 1 %
Lymphocytes Relative: 2 %
Lymphs Abs: 0.3 10*3/uL — ABNORMAL LOW (ref 0.7–4.0)
MCH: 28.9 pg (ref 26.0–34.0)
MCHC: 31.7 g/dL (ref 30.0–36.0)
MCV: 90.9 fL (ref 80.0–100.0)
Monocytes Absolute: 0.9 10*3/uL (ref 0.1–1.0)
Monocytes Relative: 7 %
Neutro Abs: 11.5 10*3/uL — ABNORMAL HIGH (ref 1.7–7.7)
Neutrophils Relative %: 88 %
Platelets: 119 10*3/uL — ABNORMAL LOW (ref 150–400)
RBC: 2.98 MIL/uL — ABNORMAL LOW (ref 4.22–5.81)
RDW: 20.1 % — ABNORMAL HIGH (ref 11.5–15.5)
WBC: 13 10*3/uL — ABNORMAL HIGH (ref 4.0–10.5)
nRBC: 0 % (ref 0.0–0.2)

## 2021-11-04 MED ORDER — MELATONIN 5 MG PO TABS
5.0000 mg | ORAL_TABLET | Freq: Every evening | ORAL | Status: DC | PRN
  Administered 2021-11-07 – 2021-11-11 (×2): 5 mg via ORAL
  Filled 2021-11-04 (×2): qty 1

## 2021-11-04 MED ORDER — PIPERACILLIN-TAZOBACTAM IN DEX 2-0.25 GM/50ML IV SOLN
2.2500 g | Freq: Three times a day (TID) | INTRAVENOUS | Status: DC
  Administered 2021-11-04 – 2021-11-08 (×11): 2.25 g via INTRAVENOUS
  Filled 2021-11-04 (×14): qty 50
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
## 2021-11-04 NOTE — Plan of Care (Signed)

## 2021-11-04 NOTE — Care Management Important Message (Signed)
Important Message  Patient Details  Name: Kage Willmann MRN: [id number] Date of Birth: 04-22-1965   Medicare Important Message Given:  Yes     Shelda Altes 11/04/2021, 10:48 AM

## 2021-11-04 NOTE — Progress Notes (Signed)
[facility] Progress Note   Background: 56 year old patient with ESRD on hemodialysis MWF at [facility]. He presented to [facility] 10/10/2021 with worsening SOB, diabetic foot ulcers. CXR with large R and moderate L pleural effusions S/P thoracentesis for 1.5 L 11/02/2021. Treatment initiated for sepsis 2/2 to gangrene R toe with cellulitis, lactic acidosis, B Fragilis bacteremia.  He was seen by cardiology-newly reduced EF 25-30% with G3DD, moderate to severe AS/AI. Seen by Dr. Royce Macadamia 11/01/2021. Issues with hypotension impeding volume removal. He was transferred to [facility] 11/04/2021 for further treatment.    Subjective: Seen standing up in room. Denies SOB.   Objective Vitals:   11/03/21 2005 11/03/21 2016 11/03/21 2348 11/04/21 0345  BP:  (!) 100/54 (!) 90/51 (!) 97/56  Pulse: 84  94 94  Resp:  '18 20 20  '$ Temp:  98.4 F (36.9 C) 98.4 F (36.9 C) 98.6 F (37 C)  TempSrc:  Oral Oral Oral  SpO2: 100% 96% 99% 94%  Weight:    79.8 kg  Height:       Physical Exam General: Chronically ill appearing  Heart: G6,Y6 RRR 2/6 systolic M. No JVD Lungs: Decreased in bases, R>L otherwise CTAB.  Abdomen: NABS, NT Extremities: Multiple bilateral foot wounds trace LE edema Dialysis Access: L AVF + T/G   Additional Objective Labs: Basic Metabolic Panel: Recent Labs  Lab 11/02/21 0604 11/03/21 0306 11/04/21 0053  NA 134* 132* 137  K 5.0 3.9 3.4*  CL 96* 95* 108  CO2 25 27 20*  GLUCOSE 174* 150* 174*  BUN 63* 40* 39*  CREATININE 6.21* 5.04* 4.60*  CALCIUM 9.4 8.8* 6.8*  PHOS 7.5* 5.5* 4.2   Liver Function Tests: Recent Labs  Lab 10/29/2021 1553 11/01/21 0525 11/02/21 0604 11/03/21 0306 11/04/21 0053  AST 57* 29  --   --   --   ALT 47* 34  --   --   --   ALKPHOS 112 92  --   --   --   BILITOT 1.6* 1.1  --   --   --   PROT 7.4 6.2*  --   --   --   ALBUMIN 3.3* 2.7* 2.6* 2.6* 1.7*   No results for input(s): "LIPASE", "AMYLASE" in the last 168  hours. CBC: Recent Labs  Lab 10/08/2021 1553 11/01/21 0525 11/02/21 0604 11/03/21 0306 11/04/21 0053  WBC 19.6* 16.0* 15.8* 12.9* 13.0*  NEUTROABS 17.6*  --  13.7* 11.0* 11.5*  HGB 12.6* 11.3* 11.1* 11.2* 8.6*  HCT 40.1 35.3* 34.7* 35.2* 27.1*  MCV 91.1 88.7 89.2 90.0 90.9  PLT 185 159 155 126* 119*   Blood Culture    Component Value Date/Time   SDES PLEURAL BOTTLES DRAWN AEROBIC AND ANAEROBIC 11/02/2021 0854   SDES PLEURAL 11/02/2021 0854   SPECREQUEST Blood Culture adequate volume 11/02/2021 0854   SPECREQUEST NONE 11/02/2021 0854   CULT  11/02/2021 0854    NO GROWTH 2 DAYS Performed at [facility], [address], [place],  [address]    REPTSTATUS PENDING 11/02/2021 0854   REPTSTATUS 11/02/2021 FINAL 11/02/2021 0854    Cardiac Enzymes: No results for input(s): "CKTOTAL", "CKMB", "CKMBINDEX", "TROPONINI" in the last 168 hours. CBG: Recent Labs  Lab 11/03/21 1604 11/03/21 2102 11/04/21 0616 11/04/21 1141 11/04/21 1624  GLUCAP 192* 176* 178* 201* 149*   Iron Studies: No results for input(s): "IRON", "TIBC", "TRANSFERRIN", "FERRITIN" in the last 72 hours. '@lablastinr3'$ @ Studies/Results: VAS [place] [name] WITH/WO TBI  Result  Date: 11/04/2021  LOWER EXTREMITY DOPPLER STUDY Patient Name:  ROLLIN KOTOWSKI  Date of Exam:   11/04/2021 Medical Rec #: [id number]        Accession #:    [id number] Date of Birth: [birth year]        Patient Gender: M Patient Age:   27 years Exam Location:  [facility] Procedure:      VAS [place] [name] WITH/WO TBI Referring Phys: MARCUS DUDA --------------------------------------------------------------------------------  Indications: Gangrene.  Comparison Study: no prior Performing Technologist: Archie Patten RVS  Examination Guidelines: A complete evaluation includes at minimum, Doppler waveform signals and systolic blood pressure reading at the level of bilateral brachial, anterior tibial, and posterior tibial arteries, when vessel segments are  accessible. Bilateral testing is considered an integral part of a complete examination. Photoelectric Plethysmograph (PPG) waveforms and toe systolic pressure readings are included as required and additional duplex testing as needed. Limited examinations for reoccurring indications may be performed as noted.  [name] Findings: +---------+------------------+-----+---------+--------+ Right    Rt Pressure (mmHg)IndexWaveform Comment  +---------+------------------+-----+---------+--------+ Brachial 99                     triphasic         +---------+------------------+-----+---------+--------+ PTA      255               2.58 biphasic          +---------+------------------+-----+---------+--------+ DP       255               2.58 biphasic          +---------+------------------+-----+---------+--------+ Great Toe                       Absent            +---------+------------------+-----+---------+--------+ +---------+------------------+-----+-------------------+-------+ Left     Lt Pressure (mmHg)IndexWaveform           Comment +---------+------------------+-----+-------------------+-------+ Brachial                                           avf     +---------+------------------+-----+-------------------+-------+ PTA      90                0.91 dampened monophasic        +---------+------------------+-----+-------------------+-------+ DP       68                0.69 dampened monophasic        +---------+------------------+-----+-------------------+-------+ Great Toe                       Absent                     +---------+------------------+-----+-------------------+-------+ +-------+-----------+-----------+------------+------------+ [name]/TBIToday's ABIToday's TBIPrevious ABIPrevious TBI +-------+-----------+-----------+------------+------------+ Right  2.58                                            +-------+-----------+-----------+------------+------------+ Left   0.91                                           +-------+-----------+-----------+------------+------------+  Summary: Right: Resting right ankle-brachial index indicates noncompressible right lower extremity arteries. The right toe-brachial index is absent. Left: Although ankle brachial indices show mild disease, arterial Doppler waveforms at the ankle suggest some component of arterial occlusive disease. The left toe-brachial index is absent.  *See table(s) above for measurements and observations.  Electronically signed by Monica Martinez MD on 11/04/2021 at 12:06:49 PM.    Final    Medications:  anticoagulant sodium citrate     piperacillin-tazobactam (ZOSYN)  IV 2.25 g (11/04/21 1001)    atorvastatin  20 mg Oral Daily   calcitRIOL  0.25 mcg Oral Q M,W,F-HD   calcium acetate  667 mg Oral TID WC   Chlorhexidine Gluconate Cloth  6 each Topical Q0600   Chlorhexidine Gluconate Cloth  6 each Topical Q0600   feeding supplement (GLUCERNA SHAKE)  237 mL Oral TID BM   ferrous sulfate  325 mg Oral Q breakfast   insulin aspart  0-5 Units Subcutaneous QHS   insulin aspart  0-6 Units Subcutaneous TID WC   insulin glargine-yfgn  8 Units Subcutaneous QHS   midodrine  10 mg Oral TID WC   pantoprazole  40 mg Oral Daily   Dialysis Orders: [facility] MWF 3:45hr 400/500  77 kg 2.0 K/2.5 Ca L AVF  - No heparin - Calcitriol 0.25 mcg PO TIW - Mircera 150 mcg IV q 2 weeks - Venofer 50 mg IV weekly  Assessment/Plan: 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 Going to aortagram tomorrow.  B Fragilis Bacteremia-ABX per primary Acute systolic HF-management per cardiology. Optimize volume with HD.  Bilateral Pleural Effusion: S/P R thoracentesis 11/02/2021. Needs lower EDW to prevent re-accumulation of fluid.  Aortic Valve disease-W/U per Cardiology.  ESRD -MWF. HD today on schedule Anemia -HGB 11.2 10/31 down to 8.6 today.  Denies acute blood loss. Give ESA with HD today. Hold venofer in setting of sepsis.  Secondary hyperparathyroidism - PO4 at goal. Continue binder, VDRA Hypotension/Volume-Continue midodrine for hypotension. Does not appear to be overtly volume overloaded by exam. UF as tolerated.  Nutrition - Very low albumin. Add protein supplements DMT2-management per primary  Devin Foskey H. Jacquelyne Quarry NP-C 11/04/2021, 5:07 PM  [employer] [phone]

## 2021-11-04 NOTE — Progress Notes (Signed)
PROGRESS NOTE    Paul Gonzalez  HKV:[id number] DOB: 06/08/1965 DOA: 10/19/2021 PCP: Coral Spikes, DO   Brief Narrative:  56 y.o. male with medical history significant of Hypertension, hyperlipidemia, ESRD on HD (MWF), anemia of chronic disease, diabetic retinopathy with vision loss who presents to the emergency department due to worsening shortness of breath that has been ongoing for about 2 weeks, this was associated with nonproductive cough.  He  also complained of bilateral diabetic foot ulcers and states that he has not been able to get an appointment with a podiatrist.  He denies chest pain, fever, chills, nausea, vomiting, diarrhea or constipation.   ED Course:  In the emergency department, he was intermittently tachypneic, BP was soft at 105/52, other vital signs were within normal range.  Work-up in the ED showed leukocytosis, normocytic anemia, BMP showed sodium 135, potassium 4.9, chloride 93, bicarb 25, glucose 173, BUN 45, creatinine 5.14, albumin 3.3, AST 57, ALT 47, total bilirubin 1.6.  Anion gap 17, lactic acid 2.4 > 2.8, BNP > 4,500.  Influenza A, B, SARS coronavirus 2 was negative.  Right foot x-ray showed no fracture or dislocation of the right foot.  No radiographic findings of osteomyelitis.  Soft tissue wound over the medial great toe.  Soft tissue edema of the forefoot.  Chest x-ray showed cardiomegaly without failure.  Bilateral pleural effusion and associated airspace disease, likely atelectasis, right greater than left.  He was treated with IV ceftriaxone, vancomycin and azithromycin.  Midodrine was given.  IV hydration was provided.  Hospitalist was asked to admit patient for further evaluation and management.  Orthopedic surgeon (Dr. Erlinda Hong) was consulted and patient will be seen when he arrives at [facility].   **Interim History Orthopedic surgery evaluated and recommended ABIs and also recommended vascular evaluation.  Vascular surgery is now evaluating and plan is for  aortogram with lower extremity arteriogram.  Nephrology is also been consulted and plan is for dialysis later today.  Assessment and Plan: No notes have been filed under this hospital service. Service: Hospitalist  Sepsis secondary to Gangrene Right Toe with Cellulitis -Patient will likely require amputation of gangrenous tissue -Continue antibiotics for B fragilis coverage with IV zosyn -Plan is for Aortogram with lower extremity arteriogram with possible intervention tomorrow -[name]'s ordered and showed and reveals Cataio vessels on the right with biphasic waveforms and dampened monophasic waveforms on the left.  -Orthopedic Surgery consulted and will follow up after Vascular Surgery evaluation   New finding of Cardiomyopathy  Acute Systolic CHF/HFrEF Volume overload -Fluid management with hemodialysis  -Midodrine dose increased due to soft BPs limiting volume removal for HD -Echo with findings of change in EF now down to 20-25% -BNP was >4,500 -Inpatient cardiology consultation requested -Pt may require TEE to better assess for valvular disease  -Appreciate cardiology team recs; they will follow him at [facility]    B Fragilis Bacteremia -My colleague discussed with Pharmacist and the patient waschanged to IV Zosyn for better coverage   ESRD on HD Metabolic Acidosis -He is on schedule with MWF HD treatments -Nephrology was consulted regarding inpatient HD treatments -Electrolytes are stable at this time and Na+ improved from 132 -> 137 but K+ is slightly low and is 3.4 -Patient's BUN/Cr went from 63/6.21 -> 40/5.04 -> 39/4.60 -Nephrology team has been consulted and plan is for Dialysis today   Hypokalemia -As above -Patient's K+ is 3.4 and likely to be replete in Dialysis -Continue to Monitor and Trend -Repeat CMP in  the AM    Lactic Acidosis -Last Lactate down to 2.1 now   Bilateral Pleural Effusions R>L -Thoracentesis ordered for large right effusion, completed 10/30 with 1.5L  removed -Continue supportive care and volume management with HD for now -Not currently with any symptoms of respiratory distress -Repeat CXR in the AM     Leukocytosis  -Continue to monitor CBC/diff as response to treating infection -Follow-up blood cultures -Patient's WBC went from 15.8 -> 12.9 -> 13.0 -Continue to Monitor and Trend and repeat CBC in the AM    Diabetes mellitus with neurological, renal, vascular complications -Continue very sensitive NovoLog sliding scale before meals and at bedtime SSI coverage, daily lantus 8 units for basal coverage, frequent CBG monitoring -Continue monitor CBGs per protocol and CBGs ranging from 120-201   Chronic Hypotension  -Had resumed Midodrine, dose increased by nephrology due to soft BPs limiting HD volume removal  -Continue to monitor blood pressures per protocol -Last blood pressure reading was 97/56   Normocytic Anemia -Patient's Hgb/Hct went from 11.2/35.2 -> 8.6/27.1 -Check Anemia Panel in the AM  -Continue to Monitor for S/Sx of Bleeding; No overt bleeding noted -Repeat CBC in the AM   Thrombocytopenia -Patient's Plaletet Count went from Platelet went from 126 -> 119 -Continue to Monitor for S/Sx of Bleeding; No overt bleeding noted -Repeat CBC in the AM   Hypoalbuminemia -Patient's Albumin Level went from 2.7 -> 2.6 -> 2.6 -> 1.7 -Continue to Monitor and Trend -Repeat CMP in the AM   DVT prophylaxis: SCDs Start: 10/15/2021 2114    Code Status: Full Code Family Communication: Discussed with family present at bedside  Disposition Plan:  Level of care: Telemetry Medical Status is: Inpatient Remains inpatient appropriate because: She is to undergo an aortogram with lower extremity arteriogram tomorrow and will likely need further evaluation by orthopedic surgery   Consultants:  Cardiology Nephrology Orthopedic Surgery Vascular Surgery   Procedures:  [name]'s  Summary:  Right: Resting right ankle-brachial index  indicates noncompressible right  lower extremity arteries. The right toe-brachial index is absent.   Left: Although ankle brachial indices show mild disease, arterial Doppler  waveforms at the ankle suggest some component of arterial occlusive  disease. The left toe-brachial index is absent.   Antimicrobials:  Anti-infectives (From admission, onward)    Start     Dose/Rate Route Frequency Ordered Stop   11/04/21 2300  piperacillin-tazobactam (ZOSYN) IVPB 2.25 g  Status:  Discontinued        2.25 g 100 mL/hr over 30 Minutes Intravenous Every 8 hours 11/03/21 2305 11/04/21 0004   11/04/21 0015  piperacillin-tazobactam (ZOSYN) IVPB 2.25 g        2.25 g 100 mL/hr over 30 Minutes Intravenous Every 8 hours 11/04/21 0005     11/02/21 1800  ceFEPIme (MAXIPIME) 2 g in sodium chloride 0.9 % 100 mL IVPB  Status:  Discontinued        2 g 200 mL/hr over 30 Minutes Intravenous Every M-W-F (1800) 10/14/2021 2119 11/02/21 0947   11/02/21 1200  vancomycin (VANCOREADY) IVPB 750 mg/150 mL  Status:  Discontinued        750 mg 150 mL/hr over 60 Minutes Intravenous Every M-W-F (Hemodialysis) 10/30/2021 2119 11/02/21 0948   11/02/21 1200  piperacillin-tazobactam (ZOSYN) IVPB 2.25 g  Status:  Discontinued        2.25 g 100 mL/hr over 30 Minutes Intravenous Every 8 hours 11/02/21 0952 11/02/21 1014   11/02/21 1200  piperacillin-tazobactam (ZOSYN) 2.25 g in  sodium chloride 0.9 % 50 mL IVPB  Status:  Discontinued        2.25 g 100 mL/hr over 30 Minutes Intravenous Every 8 hours 11/02/21 1014 11/03/21 2305   10/18/2021 2130  vancomycin (VANCOREADY) IVPB 500 mg/100 mL        500 mg 100 mL/hr over 60 Minutes Intravenous  Once 10/09/2021 2119 11/01/21 0152   10/18/2021 2100  vancomycin (VANCOREADY) IVPB 1500 mg/300 mL  Status:  Discontinued        1,500 mg 150 mL/hr over 120 Minutes Intravenous  Once 10/08/2021 2057 10/16/2021 2119   10/05/2021 2100  ceFEPIme (MAXIPIME) 2 g in sodium chloride 0.9 % 100 mL IVPB        2 g 200  mL/hr over 30 Minutes Intravenous  Once 10/22/2021 2057 11/01/21 0152   10/30/2021 1630  vancomycin (VANCOCIN) IVPB 1000 mg/200 mL premix        1,000 mg 200 mL/hr over 60 Minutes Intravenous  Once 10/30/2021 1628 11/03/2021 1818   10/10/2021 1530  cefTRIAXone (ROCEPHIN) 2 g in sodium chloride 0.9 % 100 mL IVPB  Status:  Discontinued        2 g 200 mL/hr over 30 Minutes Intravenous Every 24 hours 10/30/2021 1522 10/07/2021 2119   11/02/2021 1530  azithromycin (ZITHROMAX) 500 mg in sodium chloride 0.9 % 250 mL IVPB  Status:  Discontinued        500 mg 250 mL/hr over 60 Minutes Intravenous Every 24 hours 10/06/2021 1522 11/02/21 0947        Subjective: Seen and examined at bedside and was having some pain in his feet and legs.  No nausea or vomiting.  States he is foot got bad over the last few weeks and states it has been very hard for him to get into see a physician.  No nausea or vomiting.  No other concerns or complaints this time.  Objective: Vitals:   11/03/21 2005 11/03/21 2016 11/03/21 2348 11/04/21 0345  BP:  (!) 100/54 (!) 90/51 (!) 97/56  Pulse: 84  94 94  Resp:  '18 20 20  '$ Temp:  98.4 F (36.9 C) 98.4 F (36.9 C) 98.6 F (37 C)  TempSrc:  Oral Oral Oral  SpO2: 100% 96% 99% 94%  Weight:    79.8 kg  Height:        Intake/Output Summary (Last 24 hours) at 11/04/2021 0741 Last data filed at 11/03/2021 1830 Gross per 24 hour  Intake 232.59 ml  Output --  Net 232.59 ml   Filed Weights   11/02/21 1439 11/03/21 1601 11/04/21 0345  Weight: 60 kg 79.3 kg 79.8 kg   Examination: Physical Exam:  Constitutional: WN/WD overweight chronically ill-appearing Caucasian male in no acute distress appears calm Respiratory: Diminished to auscultation bilaterally, no wheezing, rales, rhonchi or crackles. Normal respiratory effort and patient is not tachypenic. No accessory muscle use.  Unlabored breathing and not wearing any supplemental oxygen via nasal cannula Cardiovascular: RRR, no murmurs / rubs  / gallops. S1 and S2 auscultated.  Has some mild lower extremity edema Abdomen: Soft, non-tender, distended second body habitus bowel sounds positive.  GU: Deferred. Musculoskeletal: Lower extremity ulcers noted on his feet bilaterally worse on the right compared to left Skin: No feet ulcers noted worse on the right great toe and left heel Neurologic: CN 2-12 grossly intact with no focal deficits. Romberg sign and cerebellar reflexes not assessed.  Psychiatric: Normal judgment and insight. Alert and oriented x 3. Normal mood and  appropriate affect.   Data Reviewed: I have personally reviewed following labs and imaging studies  CBC: Recent Labs  Lab 10/30/2021 1553 11/01/21 0525 11/02/21 0604 11/03/21 0306 11/04/21 0053  WBC 19.6* 16.0* 15.8* 12.9* 13.0*  NEUTROABS 17.6*  --  13.7* 11.0* 11.5*  HGB 12.6* 11.3* 11.1* 11.2* 8.6*  HCT 40.1 35.3* 34.7* 35.2* 27.1*  MCV 91.1 88.7 89.2 90.0 90.9  PLT 185 159 155 126* 062*   Basic Metabolic Panel: Recent Labs  Lab 10/22/2021 1553 11/01/21 0525 11/02/21 0604 11/03/21 0306 11/04/21 0053  NA 135 134* 134* 132* 137  K 4.9 4.9 5.0 3.9 3.4*  CL 93* 96* 96* 95* 108  CO2 '25 25 25 27 '$ 20*  GLUCOSE 173* 156* 174* 150* 174*  BUN 45* 49* 63* 40* 39*  CREATININE 5.14* 5.27* 6.21* 5.04* 4.60*  CALCIUM 10.0 9.7 9.4 8.8* 6.8*  MG  --  2.1  --   --   --   PHOS  --  7.0* 7.5* 5.5* 4.2   GFR: Estimated Creatinine Clearance: 18.1 mL/min (A) (by C-G formula based on SCr of 4.6 mg/dL (H)). Liver Function Tests: Recent Labs  Lab 10/17/2021 1553 11/01/21 0525 11/02/21 0604 11/03/21 0306 11/04/21 0053  AST 57* 29  --   --   --   ALT 47* 34  --   --   --   ALKPHOS 112 92  --   --   --   BILITOT 1.6* 1.1  --   --   --   PROT 7.4 6.2*  --   --   --   ALBUMIN 3.3* 2.7* 2.6* 2.6* 1.7*   No results for input(s): "LIPASE", "AMYLASE" in the last 168 hours. No results for input(s): "AMMONIA" in the last 168 hours. Coagulation Profile: Recent Labs   Lab 10/23/2021 1553  INR 1.6*   Cardiac Enzymes: No results for input(s): "CKTOTAL", "CKMB", "CKMBINDEX", "TROPONINI" in the last 168 hours. BNP (last 3 results) No results for input(s): "PROBNP" in the last 8760 hours. HbA1C: No results for input(s): "HGBA1C" in the last 72 hours. CBG: Recent Labs  Lab 11/03/21 0752 11/03/21 1131 11/03/21 1604 11/03/21 2102 11/04/21 0616  GLUCAP 120* 160* 192* 176* 178*   Lipid Profile: No results for input(s): "CHOL", "HDL", "LDLCALC", "TRIG", "CHOLHDL", "LDLDIRECT" in the last 72 hours. Thyroid Function Tests: No results for input(s): "TSH", "T4TOTAL", "FREET4", "T3FREE", "THYROIDAB" in the last 72 hours. Anemia Panel: No results for input(s): "VITAMINB12", "FOLATE", "FERRITIN", "TIBC", "IRON", "RETICCTPCT" in the last 72 hours. Sepsis Labs: Recent Labs  Lab 10/05/2021 1553 10/12/2021 1801 11/01/2021 2101  LATICACIDVEN 2.4* 2.8* 2.1*    Recent Results (from the past 240 hour(s))  Resp Panel by RT-PCR (Flu A&B, Covid) Anterior Nasal Swab     Status: None   Collection Time: 10/07/2021  3:22 PM   Specimen: Anterior Nasal Swab  Result Value Ref Range Status   SARS Coronavirus 2 by RT PCR NEGATIVE NEGATIVE Final    Comment: (NOTE) SARS-CoV-2 target nucleic acids are NOT DETECTED.  The SARS-CoV-2 RNA is generally detectable in upper respiratory specimens during the acute phase of infection. The lowest concentration of SARS-CoV-2 viral copies this assay can detect is 138 copies/mL. A negative result does not preclude SARS-Cov-2 infection and should not be used as the sole basis for treatment or other patient management decisions. A negative result may occur with  improper specimen collection/handling, submission of specimen other than nasopharyngeal swab, presence of viral mutation(s) within the areas  targeted by this assay, and inadequate number of viral copies(<138 copies/mL). A negative result must be combined with clinical observations,  patient history, and epidemiological information. The expected result is Negative.  Fact Sheet for Patients:  EntrepreneurPulse.com.au  Fact Sheet for Healthcare Providers:  IncredibleEmployment.be  This test is no t yet approved or cleared by the [place] FDA and  has been authorized for detection and/or diagnosis of SARS-CoV-2 by FDA under an Emergency Use Authorization (EUA). This EUA will remain  in effect (meaning this test can be used) for the duration of the COVID-19 declaration under Section 564(b)(1) of the Act, 21 U.S.C.section 360bbb-3(b)(1), unless the authorization is terminated  or revoked sooner.       Influenza A by PCR NEGATIVE NEGATIVE Final   Influenza B by PCR NEGATIVE NEGATIVE Final    Comment: (NOTE) The Xpert Xpress SARS-CoV-2/FLU/RSV plus assay is intended as an aid in the diagnosis of influenza from Nasopharyngeal swab specimens and should not be used as a sole basis for treatment. Nasal washings and aspirates are unacceptable for Xpert Xpress SARS-CoV-2/FLU/RSV testing.  Fact Sheet for Patients: EntrepreneurPulse.com.au  Fact Sheet for Healthcare Providers: IncredibleEmployment.be  This test is not yet approved or cleared by the [place] FDA and has been authorized for detection and/or diagnosis of SARS-CoV-2 by FDA under an Emergency Use Authorization (EUA). This EUA will remain in effect (meaning this test can be used) for the duration of the COVID-19 declaration under Section 564(b)(1) of the Act, 21 U.S.C. section 360bbb-3(b)(1), unless the authorization is terminated or revoked.  Performed at [facility], [address] [place], [place] [address]   Blood Culture (routine x 2)     Status: None (Preliminary result)   Collection Time: 10/10/2021  3:53 PM   Specimen: BLOOD RIGHT ARM  Result Value Ref Range Status   Specimen Description BLOOD RIGHT ARM  Final    Special Requests   Final    BOTTLES DRAWN AEROBIC AND ANAEROBIC Blood Culture adequate volume   Culture   Final    NO GROWTH 4 DAYS Performed at [facility], [address] [place], [place] [address]    Report Status PENDING  Incomplete  Blood Culture (routine x 2)     Status: Abnormal   Collection Time: 10/30/2021  3:54 PM   Specimen: Right Antecubital; Blood  Result Value Ref Range Status   Specimen Description   Final    RIGHT ANTECUBITAL Performed at [facility], [address], [place], [place] [address]    Special Requests   Final    BOTTLES DRAWN AEROBIC AND ANAEROBIC Blood Culture results may not be optimal due to an excessive volume of blood received in culture bottles Performed at [facility], [address], [place], [place] [address]    Culture  Setup Time   Final    GRAM NEGATIVE RODS Gram Stain Report Called to,Read Back By and Verified With:  J. LONG @ 1512 BY STEPHTR 11/01/21 CRITICAL RESULT CALLED TO, READ BACK BY AND VERIFIED WITH: E PREYER,RN'@0125'$  0/30/23 [place]    Culture (A)  Final    BACTEROIDES FRAGILIS BETA LACTAMASE POSITIVE Performed at [facility], [place] [address] [place], [place] [address]    Report Status 11/03/2021 FINAL  Final  Blood Culture ID Panel (Reflexed)     Status: Abnormal   Collection Time: 10/15/2021  3:54 PM  Result Value Ref Range Status   Enterococcus faecalis NOT DETECTED NOT DETECTED Final   Enterococcus Faecium  NOT DETECTED NOT DETECTED Final   Listeria monocytogenes NOT DETECTED NOT DETECTED Final   Staphylococcus species NOT DETECTED NOT DETECTED Final   Staphylococcus aureus (BCID) NOT DETECTED NOT DETECTED Final   Staphylococcus epidermidis NOT DETECTED NOT DETECTED Final   Staphylococcus lugdunensis NOT DETECTED NOT DETECTED Final   Streptococcus species NOT DETECTED NOT DETECTED Final   Streptococcus agalactiae NOT DETECTED NOT DETECTED Final   Streptococcus pneumoniae NOT DETECTED NOT DETECTED Final   Streptococcus  pyogenes NOT DETECTED NOT DETECTED Final   A.calcoaceticus-baumannii NOT DETECTED NOT DETECTED Final   Bacteroides fragilis DETECTED (A) NOT DETECTED Final    Comment: CRITICAL RESULT CALLED TO, READ BACK BY AND VERIFIED WITH: E PREYER,RN'@0125'$  11/02/21 [place]    Enterobacterales NOT DETECTED NOT DETECTED Final   Enterobacter cloacae complex NOT DETECTED NOT DETECTED Final   Escherichia coli NOT DETECTED NOT DETECTED Final   Klebsiella aerogenes NOT DETECTED NOT DETECTED Final   Klebsiella oxytoca NOT DETECTED NOT DETECTED Final   Klebsiella pneumoniae NOT DETECTED NOT DETECTED Final   Proteus species NOT DETECTED NOT DETECTED Final   Salmonella species NOT DETECTED NOT DETECTED Final   Serratia marcescens NOT DETECTED NOT DETECTED Final   Haemophilus influenzae NOT DETECTED NOT DETECTED Final   Neisseria meningitidis NOT DETECTED NOT DETECTED Final   Pseudomonas aeruginosa NOT DETECTED NOT DETECTED Final   Stenotrophomonas maltophilia NOT DETECTED NOT DETECTED Final   Candida albicans NOT DETECTED NOT DETECTED Final   Candida auris NOT DETECTED NOT DETECTED Final   Candida glabrata NOT DETECTED NOT DETECTED Final   Candida krusei NOT DETECTED NOT DETECTED Final   Candida parapsilosis NOT DETECTED NOT DETECTED Final   Candida tropicalis NOT DETECTED NOT DETECTED Final   Cryptococcus neoformans/gattii NOT DETECTED NOT DETECTED Final    Comment: Performed at [facility], [address] [place], [place] [address]  Culture, body fluid w Gram Stain-bottle     Status: None (Preliminary result)   Collection Time: 11/02/21  8:54 AM   Specimen: Pleura  Result Value Ref Range Status   Specimen Description PLEURAL BOTTLES DRAWN AEROBIC AND ANAEROBIC  Final   Special Requests Blood Culture adequate volume  Final   Culture   Final    NO GROWTH 2 DAYS Performed at [facility], [address], [place], [place] [address]    Report Status PENDING  Incomplete  Gram stain     Status: None    Collection Time: 11/02/21  8:54 AM   Specimen: Pleura  Result Value Ref Range Status   Specimen Description PLEURAL  Final   Special Requests NONE  Final   Gram Stain   Final    NO ORGANISMS SEEN WBC PRESENT, PREDOMINANTLY PMN CYTOSPIN SMEAR Performed at [facility], [address], [place], [place] [address]    Report Status 11/02/2021 FINAL  Final     Radiology Studies: US THORACENTESIS ASP PLEURAL SPACE W/IMG GUIDE  Result Date: 11/02/2021 INDICATION: BILATERAL pleural effusions larger on RIGHT, shortness of breath, history end-stage renal disease on dialysis, diabetes mellitus, hypertension EXAM: ULTRASOUND GUIDED DIAGNOSTIC AND THERAPEUTIC RIGHT THORACENTESIS MEDICATIONS: None. COMPLICATIONS: None immediate. PROCEDURE: An ultrasound guided thoracentesis was thoroughly discussed with the patient and questions answered. The benefits, risks, alternatives and complications were also discussed. The patient understands and wishes to proceed with the procedure. Written consent was obtained. Ultrasound was performed to localize and mark an adequate pocket of fluid in the RIGHT chest. The area was then prepped and draped in the  normal sterile fashion. 1% Lidocaine was used for local anesthesia. Under ultrasound guidance a 6 Fr Safe-T-Centesis catheter was introduced. Thoracentesis was performed. The catheter was removed and a dressing applied. FINDINGS: A total of approximately 1.5 L of yellow fluid was removed. Samples were sent to the laboratory as requested by the clinical team. IMPRESSION: Successful ultrasound guided RIGHT thoracentesis yielding 1.5 L of pleural fluid. Electronically Signed   By: Lavonia Dana M.D.   On: 11/02/2021 10:31   DG Chest 1 View  Result Date: 11/02/2021 CLINICAL DATA:  Status post left thoracentesis. EXAM: CHEST  1 VIEW COMPARISON:  October 31, 2021. FINDINGS: No pneumothorax is noted status post left thoracentesis. Left pleural effusion appears to be smaller. Mild  bibasilar subsegmental atelectasis is noted. IMPRESSION: No pneumothorax status post left thoracentesis. Electronically Signed   By: Marijo Conception M.D.   On: 11/02/2021 09:41    Scheduled Meds:  atorvastatin  20 mg Oral Daily   calcitRIOL  0.25 mcg Oral Q M,W,F-HD   calcium acetate  667 mg Oral TID WC   Chlorhexidine Gluconate Cloth  6 each Topical Q0600   Chlorhexidine Gluconate Cloth  6 each Topical Q0600   feeding supplement (GLUCERNA SHAKE)  237 mL Oral TID BM   ferrous sulfate  325 mg Oral Q breakfast   insulin aspart  0-5 Units Subcutaneous QHS   insulin aspart  0-6 Units Subcutaneous TID WC   insulin glargine-yfgn  8 Units Subcutaneous QHS   midodrine  10 mg Oral TID WC   pantoprazole  40 mg Oral Daily   Continuous Infusions:  anticoagulant sodium citrate     piperacillin-tazobactam (ZOSYN)  IV 2.25 g (11/04/21 0044)    LOS: 4 days   Raiford Noble, DO [facility] Available via Epic secure chat 7am-7pm After these hours, please refer to coverage provider listed on [email or web address] 11/04/2021, 7:41 AM

## 2021-11-04 NOTE — Progress Notes (Signed)
Pt receives out-pt HD at [facility] on MWF. Pt has 10:45 chair time. Will assist as needed.   Melven Sartorius Renal Navigator [phone]
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
## 2021-11-04 NOTE — Consult Note (Signed)
   [facility] Inpatient Consult   11/04/2021  Paul Gonzalez 1965-01-08 [id number]   [place] Organization [ACO] Patient: Medicare ACO REACH  Primary Care Provider:  Coral Spikes, DO with [facility]   Patient screened for hospitalization with noted high risk score for unplanned readmission risk and to assess for potential [place] Management service needs for post hospital transition.  Review of patient's medical record reveals patient is admitted with sepsis  2:25 pm Met with patient and spouse at the bedside.  Patient states he is listening to construction noise which helps him because "that's what I'm use to hearinf.  PCP confirmed , reviewed for needs.  Currently, patient with ongoing medical procedures scheduled.  Plan:  Continue to follow progress and disposition to assess for post hospital care management needs.    For questions contact:   Natividad Brood, RN BSN [facility] Liaison  [phone] business mobile phone Toll free office [phone]  Fax number: [phone] [email or web address]

## 2021-11-04 NOTE — Progress Notes (Signed)
[name] has been completed.   Preliminary results in CV Proc.   Paul Gonzalez 11/04/2021 8:39 AM

## 2021-11-04 NOTE — Progress Notes (Signed)
   11/04/21 1202  Mobility  Activity Ambulated with assistance in hallway  Level of Assistance Minimal assist, patient does 75% or more  Assistive Device Front wheel walker  Distance Ambulated (ft) 200 ft  Activity Response Tolerated well  Mobility Referral Yes  $Mobility charge 1 Mobility   Mobility Specialist Progress Note  Pre-Mobility: 95%SpO2(RA) During Mobility: HR, BP, 87-96% SpO29(RA)  Pt was in bed and agreeable. Min A for bed mobility. X1 standing break d/t SOB but recovered after being coached through pursed lip breathing. Left in bed w/ all needs met and call bell in reach.   Lucious Groves Mobility Specialist

## 2021-11-04 NOTE — Progress Notes (Addendum)
Rounding Note    Patient Name: Paul Gonzalez Date of Encounter: 11/04/2021  [place] Cardiologist: Larae Grooms, MD   Subjective   No acute overnight events. Patient was transferred from [facility] to [facility] for further management of right gangrenous toe. Plan is for him to go for peripheral angiogram tomorrow. He reports his shortness of breath has improved after his thoracentesis but still not back to normal. No chest pain.  Inpatient Medications    Scheduled Meds:  atorvastatin  20 mg Oral Daily   calcitRIOL  0.25 mcg Oral Q M,W,F-HD   calcium acetate  667 mg Oral TID WC   Chlorhexidine Gluconate Cloth  6 each Topical Q0600   Chlorhexidine Gluconate Cloth  6 each Topical Q0600   feeding supplement (GLUCERNA SHAKE)  237 mL Oral TID BM   ferrous sulfate  325 mg Oral Q breakfast   insulin aspart  0-5 Units Subcutaneous QHS   insulin aspart  0-6 Units Subcutaneous TID WC   insulin glargine-yfgn  8 Units Subcutaneous QHS   midodrine  10 mg Oral TID WC   pantoprazole  40 mg Oral Daily   Continuous Infusions:  anticoagulant sodium citrate     piperacillin-tazobactam (ZOSYN)  IV 2.25 g (11/04/21 0044)   PRN Meds: acetaminophen **OR** acetaminophen, alteplase, anticoagulant sodium citrate, guaiFENesin-dextromethorphan, heparin, HYDROmorphone (DILAUDID) injection, lidocaine (PF), lidocaine-prilocaine, ondansetron **OR** ondansetron (ZOFRAN) IV, oxyCODONE, pentafluoroprop-tetrafluoroeth   Vital Signs    Vitals:   11/03/21 2005 11/03/21 2016 11/03/21 2348 11/04/21 0345  BP:  (!) 100/54 (!) 90/51 (!) 97/56  Pulse: 84  94 94  Resp:  '18 20 20  '$ Temp:  98.4 F (36.9 C) 98.4 F (36.9 C) 98.6 F (37 C)  TempSrc:  Oral Oral Oral  SpO2: 100% 96% 99% 94%  Weight:    79.8 kg  Height:        Intake/Output Summary (Last 24 hours) at 11/04/2021 0927 Last data filed at 11/03/2021 1830 Gross per 24 hour  Intake 232.59 ml  Output --  Net 232.59 ml       11/04/2021    3:45 AM 11/03/2021    4:01 PM 11/02/2021    2:39 PM  Last 3 Weights  Weight (lbs) 175 lb 14.4 oz 174 lb 13.2 oz 132 lb 4.4 oz  Weight (kg) 79.788 kg 79.3 kg 60 kg      Telemetry    Normal sinus rhythm with rates in the 80s to 90s. - Personally Reviewed  ECG    No new ECG tracing today. - Personally Reviewed  Physical Exam   GEN: No acute distress.   Neck: No JVD. Cardiac: RRR. Soft I-II/VI murmur noted throughout.  Respiratory: No increased work of breathing. Decreased breath sounds in bilateral bases (left > right). GI: Soft, non-distended, and non-tender. MS: No lower extremity edema. Gangrenous right big toe. Neuro:  No focal deficits. Psych: Normal affect. Responds appropriately.  Labs    High Sensitivity Troponin:  No results for input(s): "TROPONINIHS" in the last 720 hours.   Chemistry Recent Labs  Lab 10/21/2021 1553 11/01/21 0525 11/02/21 0604 11/03/21 0306 11/04/21 0053  NA 135 134* 134* 132* 137  K 4.9 4.9 5.0 3.9 3.4*  CL 93* 96* 96* 95* 108  CO2 '25 25 25 27 '$ 20*  GLUCOSE 173* 156* 174* 150* 174*  BUN 45* 49* 63* 40* 39*  CREATININE 5.14* 5.27* 6.21* 5.04* 4.60*  CALCIUM 10.0 9.7 9.4 8.8* 6.8*  MG  --  2.1  --   --   --   PROT 7.4 6.2*  --   --   --   ALBUMIN 3.3* 2.7* 2.6* 2.6* 1.7*  AST 57* 29  --   --   --   ALT 47* 34  --   --   --   ALKPHOS 112 92  --   --   --   BILITOT 1.6* 1.1  --   --   --   GFRNONAA 12* 12* 10* 13* 14*  ANIONGAP 17* '13 13 10 9    '$ Lipids No results for input(s): "CHOL", "TRIG", "HDL", "LABVLDL", "LDLCALC", "CHOLHDL" in the last 168 hours.  Hematology Recent Labs  Lab 11/02/21 0604 11/03/21 0306 11/04/21 0053  WBC 15.8* 12.9* 13.0*  RBC 3.89* 3.91* 2.98*  HGB 11.1* 11.2* 8.6*  HCT 34.7* 35.2* 27.1*  MCV 89.2 90.0 90.9  MCH 28.5 28.6 28.9  MCHC 32.0 31.8 31.7  RDW 20.6* 20.8* 20.1*  PLT 155 126* 119*   Thyroid No results for input(s): "TSH", "FREET4" in the last 168 hours.  BNP Recent  Labs  Lab 11/02/2021 1553  BNP >4,500.0*    DDimer No results for input(s): "DDIMER" in the last 168 hours.   Radiology    VAS [place] [name] WITH/WO TBI  Result Date: 11/04/2021  LOWER EXTREMITY DOPPLER STUDY Patient Name:  Paul Gonzalez  Date of Exam:   11/04/2021 Medical Rec #: [id number]        Accession #:    [id number] Date of Birth: Jul 10, 1965        Patient Gender: M Patient Age:   56 years Exam Location:  [facility] Procedure:      VAS [place] [name] WITH/WO TBI Referring Phys: MARCUS DUDA --------------------------------------------------------------------------------  Indications: Gangrene.  Comparison Study: no prior Performing Technologist: Archie Patten RVS  Examination Guidelines: A complete evaluation includes at minimum, Doppler waveform signals and systolic blood pressure reading at the level of bilateral brachial, anterior tibial, and posterior tibial arteries, when vessel segments are accessible. Bilateral testing is considered an integral part of a complete examination. Photoelectric Plethysmograph (PPG) waveforms and toe systolic pressure readings are included as required and additional duplex testing as needed. Limited examinations for reoccurring indications may be performed as noted.  [name] Findings: +---------+------------------+-----+---------+--------+ Right    Rt Pressure (mmHg)IndexWaveform Comment  +---------+------------------+-----+---------+--------+ Brachial 99                     triphasic         +---------+------------------+-----+---------+--------+ PTA      255               2.58 biphasic          +---------+------------------+-----+---------+--------+ DP       255               2.58 biphasic          +---------+------------------+-----+---------+--------+ Great Toe                       Absent            +---------+------------------+-----+---------+--------+ +---------+------------------+-----+-------------------+-------+ Left     Lt Pressure  (mmHg)IndexWaveform           Comment +---------+------------------+-----+-------------------+-------+ Brachial  avf     +---------+------------------+-----+-------------------+-------+ PTA      90                0.91 dampened monophasic        +---------+------------------+-----+-------------------+-------+ DP       68                0.69 dampened monophasic        +---------+------------------+-----+-------------------+-------+ Great Toe                       Absent                     +---------+------------------+-----+-------------------+-------+ +-------+-----------+-----------+------------+------------+ [name]/TBIToday's ABIToday's TBIPrevious ABIPrevious TBI +-------+-----------+-----------+------------+------------+ Right  2.58                                           +-------+-----------+-----------+------------+------------+ Left   0.91                                           +-------+-----------+-----------+------------+------------+  Summary: Right: Resting right ankle-brachial index indicates noncompressible right lower extremity arteries. The right toe-brachial index is absent. Left: Although ankle brachial indices show mild disease, arterial Doppler waveforms at the ankle suggest some component of arterial occlusive disease. The left toe-brachial index is absent.  *See table(s) above for measurements and observations.     Preliminary    DG Chest 1 View  Result Date: 11/02/2021 CLINICAL DATA:  Status post left thoracentesis. EXAM: CHEST  1 VIEW COMPARISON:  October 31, 2021. FINDINGS: No pneumothorax is noted status post left thoracentesis. Left pleural effusion appears to be smaller. Mild bibasilar subsegmental atelectasis is noted. IMPRESSION: No pneumothorax status post left thoracentesis. Electronically Signed   By: Marijo Conception M.D.   On: 11/02/2021 09:41    Cardiac Studies   Echocardiogram  11/01/2021: Impressions:  1. Left ventricular ejection fraction, by estimation, is 25 to 30%. The  left ventricle has severely decreased function. The left ventricle  demonstrates global hypokinesis. Left ventricular diastolic parameters are  consistent with Grade III diastolic  dysfunction (restrictive).   2. Right ventricular systolic function is mildly reduced. The right  ventricular size is severely enlarged. There is moderately elevated  pulmonary artery systolic pressure. The estimated right ventricular  systolic pressure is 69.4 mmHg.   3. Left atrial size was severely dilated.   4. Right atrial size was mildly dilated.   5. A small pericardial effusion is present. The pericardial effusion is  circumferential.   6. The mitral valve is abnormal. Mild mitral valve regurgitation. Mild  mitral stenosis. Moderate mitral annular calcification.   7. The aortic valve is calcified. Aortic valve regurgitation is moderate  to severe. Moderate to severe aortic valve stenosis. GRadient of 36 mm Hg  at max despite decreased strok volume index. DVI 0.20.   Comparison(s): Significant changes from prior. Atempting to reach primary  team.    Patient Profile     56 y.o. male with a history of moderate aortic stenosis noted on Echo in 03/2021, hypertension, hyperlipidemia, type 2 diabetes mellitus, and ESRD on hemodialysis who was admitted to [facility] on 10/28/2021 for sepsis secondary to gangrenous wound of right great toe with presumed superimposed cellulitis. Blood  cultures were positive for Bacteroides fragilis. Echo at [facility] showed newly reduced EF of 25-30% for which Cardiology was consulted. He was transferred to [facility] for possible amputation of gangrenous foot.  Assessment & Plan    Acute Combined CHF Bilateral Pleural Effusion Patient admitted with sepsis as above but was also noted to have acute CHF with newly reduced EF. BNP > 4,500 and chest x-ray showed bilateral  pleural effusion (right > left).  Echo showed LVEF of 25-30% (down from 60-65% on Echo in 03/2021) with global hypokinesis and grade III diastolic dysfunction, severely enlarged RV with mildly reduced systolic function, severe left atrial enlargement, moderate to severe aortic stenosis/ aortic insufficiency with mean gradient of 36.0 mmHg, mild mitral stenosis, and small pericardial effusion. Patient underwent thoracentsis of the right pleural effusion on 10/30 with removal of 1.5 L of fluid. Otherwise, volume status has been managed with hemodialysis. - Continues to have decreased breath sounds in bases (left > right) but otherwise does not appear grossly volume overloaded. - Volume status being managed with hemodialysis. - Not on any GDMT due to hypotension requiring Midodrine and ESRD.  - Unclear whether cardiomyopathy is secondary to progressive aortic valve disease. Per Dr. Domenic Polite, would not pursue invasive cardiac work-up at this time given active bacteremia.  Aortic Valve Disease Noted to have moderate aortic stenosis on Echo in 03/2021. However, repeat Echo this admission showed progression to moderate to severe aortic stenosis as well as moderate to severe aortic insufficiency. Mean gradient 36.0 mmHg. - Will need TEE at some point to exclude endocarditis. Unable to perform this at the same time of peripheral angiogram tomorrow so will plan for this on Friday.  Shared Decision Making/Informed Consent{ The risks [esophageal damage, perforation (1:10,000 risk), bleeding, pharyngeal hematoma as well as other potential complications associated with conscious sedation including aspiration, arrhythmia, respiratory failure and death], benefits (treatment guidance and diagnostic support) and alternatives of a transesophageal echocardiogram were discussed in detail with Mr. Tierce and he is willing to proceed.   Hypotension History of hypotension requiring Midodrine. - BP soft but stable. -  Midodrine has been increased to '10mg'$  three times daily this admission.  Hyperlipidemia LDL 30 in 06/2021. - Continue Lipitor '20mg'$  daily.  Type 2 Diabetes Mellitus Hemoglobin A1c 6.4% this admission. - Management per primary team.  ESRD  On hemodialysis M/W/F.  - Nephrology following.  Sepsis Secondary to Gangrenous Right Toe with Cellulitis Bacteremia He was transferred to [facility] for consideration of possible amputation. Orthopedics and Vascular Surgery has seen and plan is for peripheral angiogram tomorrow.  - Continue antibiotics. - Management per primary team.  Pre-Op Evaluation Plan is for peripheral angiogram tomorrow. Unclear if he will ultimately require amputation. Per Dr. Domenic Polite, "His general surgical risk is high based on active comorbidities and this is largely an unmodifiable risk in the short-term.  Bigger question will be whether he needs to be considered for surgical management of his aortic valve particularly if he does have evidence of endocarditis, may need TCTS consultation depending on TEE results." Plan is for TEE on Friday 12/01/2021.   For questions or updates, please contact [place] Please consult [email or web address] for contact info under      Signed, Darreld Mclean, PA-C  11/04/2021, 9:27 AM    Patient seen and examined   I agree with findings as noted by C Goodrich above  Pt with gangrene R toe with sepsis.   He also has a hx of HFrEF and  AS   Echo yesterday shows AS is moderate/severewith mean gradient of 36 mm Hg  (question accuracy of dimensionless index)  AI is at least moderate.  This is different from echo done in March 2023   Given this and bacteremia agree with plans for TEE to further evaluate Hx of HFrEF  Volume being managed by hemodialysis  On exam Lungs with decreased BS at bases Cardiac RRR  III/VI systolic murmur base Abd is supple  Ext   Feet red.  R gr toe gangranous.  L heel with wound/eschar  Tr edema   Plan for  TEE this week     [facility] ([facility]) has seen patient   Plan for arteriogram tomorrow   Dorris Carnes MD

## 2021-11-04 NOTE — TOC Initial Note (Signed)
Transition of Care [facility]) - Initial/Assessment Note    Patient Details  Name: Paul Gonzalez MRN: [id number] Date of Birth: 18-Apr-1965  Transition of Care [facility]) CM/SW Contact:    Zenon Mayo, RN Phone Number: 11/04/2021, 7:36 PM  Clinical Narrative:                  txr from [facility], home with wife. bil Diabetic foot ulcers, for vascular scan today, NPO, trying to decide if will operate on toe, conts on iv abx. He is also a HD patient.  TOC following.    Barriers to Discharge: Continued Medical Work up   Patient Goals and CMS Choice        Expected Discharge Plan and Services                                                Prior Living Arrangements/Services                       Activities of Daily Living Home Assistive Devices/Equipment: None ADL Screening (condition at time of admission) Patient's cognitive ability adequate to safely complete daily activities?: Yes Is the patient deaf or have difficulty hearing?: No Does the patient have difficulty seeing, even when wearing glasses/contacts?: Yes Does the patient have difficulty concentrating, remembering, or making decisions?: No Patient able to express need for assistance with ADLs?: No Does the patient have difficulty dressing or bathing?: No Independently performs ADLs?: Yes (appropriate for developmental age) Does the patient have difficulty walking or climbing stairs?: No Weakness of Legs: None Weakness of Arms/Hands: None  Permission Sought/Granted                  Emotional Assessment              Admission diagnosis:  Acute exacerbation of CHF (congestive heart failure) ([place]) [I50.9] Acute sepsis ([place]) [A41.9] Patient Active Problem List   Diagnosis Date Noted   PVD (peripheral vascular disease) ([place]) 11/04/2021   Severe protein-calorie malnutrition ([place]) 11/04/2021   Acute sepsis ([place])    Elevated brain natriuretic peptide (BNP) level 10/09/2021   Sepsis  ([place]) 10/06/2021   Cellulitis in diabetic foot ([place]) 10/05/2021   Gangrene of toe of right foot ([place]) 10/18/2021   Lactic acidosis 10/11/2021   Iron deficiency anemia 11/01/2021   Hypotension 10/24/2021   GERD (gastroesophageal reflux disease) 10/19/2021   Hypoalbuminemia due to protein-calorie malnutrition ([place]) 11/01/2021   Transaminitis 10/19/2021   Aortic stenosis 06/04/2021   Cough 06/04/2021   ESRD (end stage renal disease) on dialysis ([place]) 12/04/2020   Diabetic retinopathy/legally blind 12/04/2020   Depression 01/21/2014   Anemia in CKD (chronic kidney disease) 12/11/2013   Diabetic neuropathy, type II diabetes mellitus ([place]) 04/10/2013   Pleural effusion, bilateral 03/07/2013   Essential hypertension 01/05/2013   Type 2 diabetes mellitus with complications ([place]) 17/00/1749   Mixed hyperlipidemia 01/05/2013   PCP:  Coral Spikes, DO Pharmacy:   [place], [place] - [address] [place] [address] Phone: [phone] Fax: [phone]  [place], [place] [address] Phone: [phone] Fax: [phone]     Social Determinants of Health (SDOH) Interventions    Readmission Risk Interventions  03/19/2021    1:13 PM  Readmission Risk Prevention Plan  Transportation Screening Complete  PCP or Specialist Appt within 5-7 Days Complete  Home Care Screening Complete  Medication Review (RN CM) Complete

## 2021-11-04 DEATH — deceased

## 2021-11-05 ENCOUNTER — Inpatient Hospital Stay (HOSPITAL_COMMUNITY): Payer: Medicare Other

## 2021-11-05 ENCOUNTER — Ambulatory Visit: Payer: Medicare Other | Admitting: Podiatry

## 2021-11-05 ENCOUNTER — Ambulatory Visit (HOSPITAL_COMMUNITY): Admission: RE | Admit: 2021-11-05 | Payer: Medicare Other | Source: Home / Self Care | Admitting: Vascular Surgery

## 2021-11-05 ENCOUNTER — Encounter (HOSPITAL_COMMUNITY): Admission: EM | Disposition: E | Payer: Self-pay | Source: Home / Self Care | Attending: Internal Medicine

## 2021-11-05 ENCOUNTER — Encounter (HOSPITAL_COMMUNITY): Payer: Self-pay | Admitting: Vascular Surgery

## 2021-11-05 DIAGNOSIS — N186 End stage renal disease: Secondary | ICD-10-CM | POA: Diagnosis not present

## 2021-11-05 DIAGNOSIS — I10 Essential (primary) hypertension: Secondary | ICD-10-CM | POA: Diagnosis not present

## 2021-11-05 DIAGNOSIS — J90 Pleural effusion, not elsewhere classified: Secondary | ICD-10-CM | POA: Diagnosis not present

## 2021-11-05 DIAGNOSIS — E11621 Type 2 diabetes mellitus with foot ulcer: Secondary | ICD-10-CM

## 2021-11-05 DIAGNOSIS — R7989 Other specified abnormal findings of blood chemistry: Secondary | ICD-10-CM | POA: Diagnosis not present

## 2021-11-05 HISTORY — PX: ABDOMINAL AORTOGRAM W/LOWER EXTREMITY: CATH118223

## 2021-11-05 LAB — CBC WITH DIFFERENTIAL/PLATELET
Abs Immature Granulocytes: 0.12 10*3/uL — ABNORMAL HIGH (ref 0.00–0.07)
Abs Immature Granulocytes: 0.08 10*3/uL — ABNORMAL HIGH (ref 0.00–0.07)
Basophils Absolute: 0.1 10*3/uL (ref 0.0–0.1)
Basophils Absolute: 0.1 10*3/uL (ref 0.0–0.1)
Basophils Relative: 1 %
Basophils Relative: 1 %
Eosinophils Absolute: 0.2 10*3/uL (ref 0.0–0.5)
Eosinophils Absolute: 0.3 10*3/uL (ref 0.0–0.5)
Eosinophils Relative: 1 %
Eosinophils Relative: 2 %
HCT: 34.7 % — ABNORMAL LOW (ref 39.0–52.0)
HCT: 34 % — ABNORMAL LOW (ref 39.0–52.0)
Hemoglobin: 10.9 g/dL — ABNORMAL LOW (ref 13.0–17.0)
Hemoglobin: 10.7 g/dL — ABNORMAL LOW (ref 13.0–17.0)
Immature Granulocytes: 1 %
Immature Granulocytes: 1 %
Lymphocytes Relative: 3 %
Lymphocytes Relative: 3 %
Lymphs Abs: 0.5 10*3/uL — ABNORMAL LOW (ref 0.7–4.0)
Lymphs Abs: 0.5 10*3/uL — ABNORMAL LOW (ref 0.7–4.0)
MCH: 28.5 pg (ref 26.0–34.0)
MCH: 28 pg (ref 26.0–34.0)
MCHC: 31.4 g/dL (ref 30.0–36.0)
MCHC: 31.5 g/dL (ref 30.0–36.0)
MCV: 90.6 fL (ref 80.0–100.0)
MCV: 89 fL (ref 80.0–100.0)
Monocytes Absolute: 1.1 10*3/uL — ABNORMAL HIGH (ref 0.1–1.0)
Monocytes Absolute: 1 10*3/uL (ref 0.1–1.0)
Monocytes Relative: 7 %
Monocytes Relative: 6 %
Neutro Abs: 14.6 10*3/uL — ABNORMAL HIGH (ref 1.7–7.7)
Neutro Abs: 14.7 10*3/uL — ABNORMAL HIGH (ref 1.7–7.7)
Neutrophils Relative %: 87 %
Neutrophils Relative %: 87 %
Platelets: 150 10*3/uL (ref 150–400)
Platelets: 161 10*3/uL (ref 150–400)
RBC: 3.83 MIL/uL — ABNORMAL LOW (ref 4.22–5.81)
RBC: 3.82 MIL/uL — ABNORMAL LOW (ref 4.22–5.81)
RDW: 20.9 % — ABNORMAL HIGH (ref 11.5–15.5)
RDW: 20.8 % — ABNORMAL HIGH (ref 11.5–15.5)
WBC: 16.6 10*3/uL — ABNORMAL HIGH (ref 4.0–10.5)
WBC: 16.6 10*3/uL — ABNORMAL HIGH (ref 4.0–10.5)
nRBC: 0 % (ref 0.0–0.2)
nRBC: 0 % (ref 0.0–0.2)

## 2021-11-05 LAB — CULTURE, BLOOD (ROUTINE X 2)
Culture: NO GROWTH
Special Requests: ADEQUATE

## 2021-11-05 LAB — COMPREHENSIVE METABOLIC PANEL
ALT: 29 U/L (ref 0–44)
AST: 27 U/L (ref 15–41)
Albumin: 2.4 g/dL — ABNORMAL LOW (ref 3.5–5.0)
Alkaline Phosphatase: 106 U/L (ref 38–126)
Anion gap: 17 — ABNORMAL HIGH (ref 5–15)
BUN: 62 mg/dL — ABNORMAL HIGH (ref 6–20)
CO2: 23 mmol/L (ref 22–32)
Calcium: 9.4 mg/dL (ref 8.9–10.3)
Chloride: 93 mmol/L — ABNORMAL LOW (ref 98–111)
Creatinine, Ser: 7.37 mg/dL — ABNORMAL HIGH (ref 0.61–1.24)
GFR, Estimated: 8 mL/min — ABNORMAL LOW (ref >60)
Glucose, Bld: 217 mg/dL — ABNORMAL HIGH (ref 70–99)
Potassium: 4.9 mmol/L (ref 3.5–5.1)
Sodium: 133 mmol/L — ABNORMAL LOW (ref 135–145)
Total Bilirubin: 1 mg/dL (ref 0.3–1.2)
Total Protein: 6.1 g/dL — ABNORMAL LOW (ref 6.5–8.1)

## 2021-11-05 LAB — MISC LABCORP TEST (SEND OUT): Labcorp test code: 9985

## 2021-11-05 LAB — MAGNESIUM: Magnesium: 2.2 mg/dL (ref 1.7–2.4)

## 2021-11-05 LAB — GLUCOSE, CAPILLARY
Glucose-Capillary: 154 mg/dL — ABNORMAL HIGH (ref 70–99)
Glucose-Capillary: 171 mg/dL — ABNORMAL HIGH (ref 70–99)
Glucose-Capillary: 166 mg/dL — ABNORMAL HIGH (ref 70–99)

## 2021-11-05 LAB — PHOSPHORUS: Phosphorus: 6.5 mg/dL — ABNORMAL HIGH (ref 2.5–4.6)

## 2021-11-05 SURGERY — ABDOMINAL AORTOGRAM W/LOWER EXTREMITY
Anesthesia: LOCAL

## 2021-11-05 MED ORDER — CLOPIDOGREL BISULFATE 300 MG PO TABS
ORAL_TABLET | ORAL | Status: DC | PRN
  Administered 2021-11-05: 300 mg via ORAL

## 2021-11-05 MED ORDER — HEPARIN (PORCINE) IN NACL 1000-0.9 UT/500ML-% IV SOLN
INTRAVENOUS | Status: DC | PRN
  Administered 2021-11-05 (×2): 500 mL

## 2021-11-05 MED ORDER — SODIUM CHLORIDE 0.9 % IV SOLN: INTRAVENOUS | Status: DC

## 2021-11-05 MED ORDER — MIDAZOLAM HCL 2 MG/2ML IJ SOLN
INTRAMUSCULAR | Status: DC | PRN
  Administered 2021-11-05: 1 mg via INTRAVENOUS

## 2021-11-05 MED ORDER — CLOPIDOGREL BISULFATE 300 MG PO TABS
ORAL_TABLET | ORAL | Status: AC
  Filled 2021-11-05: qty 1

## 2021-11-05 MED ORDER — MIDODRINE HCL 5 MG PO TABS
20.0000 mg | ORAL_TABLET | Freq: Once | ORAL | Status: AC
  Administered 2021-11-05: 20 mg via ORAL
  Filled 2021-11-05: qty 4

## 2021-11-05 MED ORDER — HYDRALAZINE HCL 20 MG/ML IJ SOLN: 5.0000 mg | INTRAMUSCULAR | Status: DC | PRN

## 2021-11-05 MED ORDER — FENTANYL CITRATE (PF) 100 MCG/2ML IJ SOLN
INTRAMUSCULAR | Status: AC
  Filled 2021-11-05: qty 2

## 2021-11-05 MED ORDER — HYDROCOD POLI-CHLORPHE POLI ER 10-8 MG/5ML PO SUER: 5.0000 mL | Freq: Once | ORAL | Status: DC

## 2021-11-05 MED ORDER — GUAIFENESIN ER 600 MG PO TB12
600.0000 mg | ORAL_TABLET | Freq: Two times a day (BID) | ORAL | Status: DC | PRN
  Administered 2021-11-06 – 2021-11-07 (×3): 600 mg via ORAL
  Filled 2021-11-05 (×5): qty 1

## 2021-11-05 MED ORDER — HEPARIN SODIUM (PORCINE) 1000 UNIT/ML IJ SOLN
INTRAMUSCULAR | Status: AC
  Filled 2021-11-05: qty 10

## 2021-11-05 MED ORDER — HEPARIN SODIUM (PORCINE) 1000 UNIT/ML IJ SOLN
INTRAMUSCULAR | Status: DC | PRN
  Administered 2021-11-05: 8000 [IU] via INTRAVENOUS

## 2021-11-05 MED ORDER — ASPIRIN 81 MG PO TBEC
81.0000 mg | DELAYED_RELEASE_TABLET | Freq: Every day | ORAL | Status: DC
  Administered 2021-11-05 – 2021-11-11 (×7): 81 mg via ORAL
  Filled 2021-11-05 (×7): qty 1

## 2021-11-05 MED ORDER — SODIUM CHLORIDE 0.9 % IV BOLUS
250.0000 mL | INTRAVENOUS | Status: AC
  Administered 2021-11-05: 250 mL via INTRAVENOUS

## 2021-11-05 MED ORDER — ACETAMINOPHEN 325 MG PO TABS
650.0000 mg | ORAL_TABLET | ORAL | Status: DC | PRN
  Administered 2021-11-05 – 2021-11-10 (×2): 650 mg via ORAL
  Filled 2021-11-05 (×2): qty 2

## 2021-11-05 MED ORDER — NITROGLYCERIN 1 MG/10 ML FOR IR/CATH LAB
INTRA_ARTERIAL | Status: DC | PRN
  Administered 2021-11-05 (×2): 200 ug via INTRA_ARTERIAL

## 2021-11-05 MED ORDER — ONDANSETRON HCL 4 MG/2ML IJ SOLN
4.0000 mg | Freq: Four times a day (QID) | INTRAMUSCULAR | Status: DC | PRN
  Filled 2021-11-05: qty 2

## 2021-11-05 MED ORDER — SODIUM CHLORIDE 0.9% FLUSH
3.0000 mL | Freq: Two times a day (BID) | INTRAVENOUS | Status: DC
  Administered 2021-11-05 – 2021-11-11 (×11): 3 mL via INTRAVENOUS

## 2021-11-05 MED ORDER — IODIXANOL 320 MG/ML IV SOLN
INTRAVENOUS | Status: DC | PRN
  Administered 2021-11-05: 160 mL via INTRA_ARTERIAL

## 2021-11-05 MED ORDER — CLOPIDOGREL BISULFATE 75 MG PO TABS
75.0000 mg | ORAL_TABLET | Freq: Every day | ORAL | Status: DC
  Administered 2021-11-06 – 2021-11-11 (×6): 75 mg via ORAL
  Filled 2021-11-05 (×6): qty 1

## 2021-11-05 MED ORDER — LABETALOL HCL 5 MG/ML IV SOLN: 10.0000 mg | INTRAVENOUS | Status: DC | PRN

## 2021-11-05 MED ORDER — MIDODRINE HCL 5 MG PO TABS
10.0000 mg | ORAL_TABLET | ORAL | Status: AC
  Administered 2021-11-05: 10 mg via ORAL

## 2021-11-05 MED ORDER — ALBUMIN HUMAN 25 % IV SOLN
12.5000 g | INTRAVENOUS | Status: AC
  Administered 2021-11-05: 12.5 g via INTRAVENOUS
  Filled 2021-11-05: qty 50

## 2021-11-05 MED ORDER — NITROGLYCERIN 1 MG/10 ML FOR IR/CATH LAB
INTRA_ARTERIAL | Status: AC
  Filled 2021-11-05: qty 10

## 2021-11-05 MED ORDER — LIDOCAINE HCL (PF) 1 % IJ SOLN
INTRAMUSCULAR | Status: DC | PRN
  Administered 2021-11-05: 5 mL via SUBCUTANEOUS

## 2021-11-05 MED ORDER — LIDOCAINE HCL (PF) 1 % IJ SOLN
INTRAMUSCULAR | Status: AC
  Filled 2021-11-05: qty 30

## 2021-11-05 MED ORDER — CLOPIDOGREL BISULFATE 75 MG PO TABS: 75.0000 mg | ORAL_TABLET | Freq: Every day | ORAL | Status: DC

## 2021-11-05 MED ORDER — FENTANYL CITRATE (PF) 100 MCG/2ML IJ SOLN
INTRAMUSCULAR | Status: DC | PRN
  Administered 2021-11-05: 25 ug via INTRAVENOUS

## 2021-11-05 MED ORDER — MIDAZOLAM HCL 2 MG/2ML IJ SOLN
INTRAMUSCULAR | Status: AC
  Filled 2021-11-05: qty 2

## 2021-11-05 MED ORDER — HEPARIN (PORCINE) IN NACL 1000-0.9 UT/500ML-% IV SOLN
INTRAVENOUS | Status: AC
  Filled 2021-11-05: qty 1000

## 2021-11-05 MED ORDER — SODIUM CHLORIDE 0.9% FLUSH: 3.0000 mL | INTRAVENOUS | Status: DC | PRN

## 2021-11-05 MED ORDER — SODIUM CHLORIDE 0.9 % IV SOLN
250.0000 mL | INTRAVENOUS | Status: DC | PRN
  Administered 2021-11-06: 250 mL via INTRAVENOUS

## 2021-11-05 MED ORDER — CLOPIDOGREL BISULFATE 75 MG PO TABS: 300.0000 mg | ORAL_TABLET | Freq: Once | ORAL | Status: DC

## 2021-11-05 MED ORDER — MIDODRINE HCL 5 MG PO TABS
10.0000 mg | ORAL_TABLET | ORAL | Status: AC
  Administered 2021-11-05: 10 mg via ORAL
  Filled 2021-11-05: qty 2

## 2021-11-05 SURGICAL SUPPLY — 23 items
BAG SNAP BAND KOVER 36X36 (MISCELLANEOUS) IMPLANT
BALL STERLING OTW 2.5X150X150 (BALLOONS) ×1
BALLN IN.PACT DCB 5X150 (BALLOONS) ×1
BALLN STERLING OTW 2.5X150X150 (BALLOONS) ×1
BALLOON STRLNG OTW 2.5X150X150 (BALLOONS) IMPLANT
CATH CXI SUPP 2.6F 150 ANG (CATHETERS) IMPLANT
CATH OMNI FLUSH 5F 65CM (CATHETERS) IMPLANT
CATH QUICKCROSS .018X135CM (MICROCATHETER) IMPLANT
CATH QUICKCROSS .035X135CM (MICROCATHETER) IMPLANT
DCB IN.PACT 5X150 (BALLOONS) IMPLANT
DEVICE CLOSURE MYNXGRIP 6/7F (Vascular Products) IMPLANT
GLIDEWIRE ADV .035X260CM (WIRE) IMPLANT
KIT ENCORE 26 ADVANTAGE (KITS) IMPLANT
KIT MICROPUNCTURE NIT STIFF (SHEATH) IMPLANT
KIT PV (KITS) ×1 IMPLANT
SHEATH CATAPULT 6FR 45 (SHEATH) IMPLANT
SHEATH PINNACLE 5F 10CM (SHEATH) IMPLANT
SHEATH PINNACLE 6F 10CM (SHEATH) IMPLANT
SYR MEDRAD MARK V 150ML (SYRINGE) IMPLANT
TRANSDUCER W/STOPCOCK (MISCELLANEOUS) ×1 IMPLANT
TRAY PV CATH (CUSTOM PROCEDURE TRAY) ×1 IMPLANT
WIRE BENTSON .035X145CM (WIRE) IMPLANT
WIRE G V18X300CM (WIRE) IMPLANT

## 2021-11-05 NOTE — Progress Notes (Signed)
Received patient in bed to unit.  Alert and oriented.  Informed consent signed and in chart.   Treatment initiated: 1550 Treatment completed: 1900  Patient tolerated well.  Transported back to the room  Alert, without acute distress.  Hand-off given to patient's nurse.   Access used: AVF Access issues: none  Total UF removed: 1L Medication(s) given: Midodrine '20mg'$  Post HD VS: 97/38,97.3,87,12,98% Post HD weight: 80.1kg   [facility]

## 2021-11-05 NOTE — Progress Notes (Addendum)
[facility] Progress Note   Subjective: seen post aortagram/PTA of posterior tibial artery angioplasty . Didn't get HD last PM. Will have HD today on schedule.   Objective Vitals:   11/05/21 0951 11/05/21 1004 11/05/21 1009 11/05/21 1126  BP: (!) 92/45  (!) 99/50   Pulse: 90   87  Resp: 18   18  Temp: 97.8 F (36.6 C)   97.8 F (36.6 C)  TempSrc: Oral   Oral  SpO2: 92% 100% 100%   Weight:      Height:       Physical Exam General: Chronically ill appearing  Heart: H0,W2 RRR 2/6 systolic M. No JVD Lungs: Decreased in bases, R>L otherwise CTAB.  Abdomen: NABS, NT.  Extremities: Multiple bilateral foot wounds trace LE edema. Edema on sides of abdomen, hips, upper thighs-Peau d'orange.  Dialysis Access: L AVF + T/G  Additional Objective Labs: Basic Metabolic Panel: Recent Labs  Lab 11/03/21 0306 11/04/21 0053 11/05/21 0045  NA 132* 137 133*  K 3.9 3.4* 4.9  CL 95* 108 93*  CO2 27 20* 23  GLUCOSE 150* 174* 217*  BUN 40* 39* 62*  CREATININE 5.04* 4.60* 7.37*  CALCIUM 8.8* 6.8* 9.4  PHOS 5.5* 4.2 6.5*   Liver Function Tests: Recent Labs  Lab 10/08/2021 1553 11/01/21 0525 11/02/21 0604 11/03/21 0306 11/04/21 0053 11/05/21 0045  AST 57* 29  --   --   --  27  ALT 47* 34  --   --   --  29  ALKPHOS 112 92  --   --   --  106  BILITOT 1.6* 1.1  --   --   --  1.0  PROT 7.4 6.2*  --   --   --  6.1*  ALBUMIN 3.3* 2.7*   < > 2.6* 1.7* 2.4*   < > = values in this interval not displayed.   No results for input(s): "LIPASE", "AMYLASE" in the last 168 hours. CBC: Recent Labs  Lab 11/02/21 0604 11/03/21 0306 11/04/21 0053 11/05/21 0045 11/05/21 1058  WBC 15.8* 12.9* 13.0* 16.6* 16.6*  NEUTROABS 13.7* 11.0* 11.5* 14.6* 14.7*  HGB 11.1* 11.2* 8.6* 10.9* 10.7*  HCT 34.7* 35.2* 27.1* 34.7* 34.0*  MCV 89.2 90.0 90.9 90.6 89.0  PLT 155 126* 119* 150 161   Blood Culture    Component Value Date/Time   SDES PLEURAL BOTTLES DRAWN AEROBIC AND ANAEROBIC  11/02/2021 0854   SDES PLEURAL 11/02/2021 0854   SPECREQUEST Blood Culture adequate volume 11/02/2021 0854   SPECREQUEST NONE 11/02/2021 0854   CULT  11/02/2021 0854    NO GROWTH 3 DAYS Performed at [facility], [address] [place], [place] [address]    REPTSTATUS PENDING 11/02/2021 0854   REPTSTATUS 11/02/2021 FINAL 11/02/2021 0854    Cardiac Enzymes: No results for input(s): "CKTOTAL", "CKMB", "CKMBINDEX", "TROPONINI" in the last 168 hours. CBG: Recent Labs  Lab 11/04/21 1624 11/04/21 2046 11/04/21 2250 11/05/21 0609 11/05/21 1125  GLUCAP 149* 225* 221* 154* 171*   Iron Studies: No results for input(s): "IRON", "TIBC", "TRANSFERRIN", "FERRITIN" in the last 72 hours. '@[email or web address]'$ @ Studies/Results: PERIPHERAL VASCULAR CATHETERIZATION  Result Date: 11/05/2021 Table formatting from the original result was not included. Images from the original result were not included.   Patient name: Paul Gonzalez          MRN: [id number]        DOB: 1965-01-14          Sex: male  11/05/2021 Pre-operative  Diagnosis: Critical limb ischemia of the bilateral lower extremities with diabetic foot ulcers Post-operative diagnosis:  Same Surgeon:  Marty Heck, MD Procedure Performed: 1.  Ultrasound-guided access left common femoral artery 2.  Aortogram with catheter selection of aorta 3.  Bilateral lower extremity arteriogram with selection of third order branches in the right lower extremity 4.  Right posterior tibial artery angioplasty (2.5 mm x 150 mm Sterling) 5.  Right mid to distal SFA drug-coated balloon angioplasty (5 mm x 150 mm drug-coated impact) 6.  94 minutes of monitored moderate conscious sedation time 7.  Mynx closure of the left common femoral artery  Indications: 56 year old male with end-stage renal disease and diabetes that presents with bilateral lower extremity tissue loss as previously documented.  He is followed by Dr. Sharol Given with orthopedic surgery for his foot ulcers.  He  presents today for aortogram, lower extremity arteriogram with initial focus on the right leg.  Risks benefits discussed.  Findings:  Aortogram showed no flow-limiting stenosis in the aortoiliac segment.  The left renal artery was somewhat visualized.  On the right patient had a patent common femoral, profunda, SFA.  The mid to distal SFA had about a 60-70% stenosis that was multifocal.  Popliteal artery is calcified and diseased but no flow-limiting stenosis.  Dominant runoff is in the posterior tibial that has a multifocal high-grade stenosis greater than 80% in the proximal to mid segment that is diseased for a long segment.  The peroneal is diminutive and occludes well above the ankle.  The anterior tibial has a proximal occlusion.  Small vessel disease in the foot.  Ultimately the right posterior tibial was angioplastied with a 2.5 mm Sterling with excellent results.  The mid to distal SFA was then treated with a 5 mm drug-coated balloon.  Inline flow down the right lower extremity through the posterior tibial.  Left lower extremity shows a patent common femoral, profunda, SFA, popliteal and single vessel runoff in the peroneal that gives a collateral off to the PT at the ankle. Small vessel disease in the foot.             Procedure:  The patient was identified in the holding area and taken to room 8.  The patient was then placed supine on the table and prepped and draped in the usual sterile fashion.  A time out was called.  The patient received Versed and fentanyl for moderate sedation.  I was present for all of sedation and vital signs were monitored including heart rate, respiratory rate, oxygenation and blood pressure.  Ultrasound was used to evaluate the left common femoral artery.  It was patent .  A digital ultrasound image was acquired.  A micropuncture needle was used to access the left common femoral artery under ultrasound guidance.  An 018 wire was advanced without resistance and a micropuncture  sheath was placed.  The 018 wire was removed and a benson wire was placed.  The micropuncture sheath was exchanged for a 5 french sheath.  An omniflush catheter was advanced over the wire to the level of L-1.  An abdominal angiogram was obtained.  Next the catheter was pulled down and bilateral lower extremity runoff obtained.  Pertinent findings are noted above.  Initial focus today was on the right leg.  We used a Glidewire advantage to cross the aortic bifurcation with an Omni Flush catheter and ultimately got a wire into the right SFA and exchanged for a long 6 French sheath in the  left groin over the aortic bifurcation.  Patient was given 100 units/kg IV heparin.  I then exchanged for a V18 wire with a 018 quick cross and got down to the tibial vessels.  The takeoff of the posterior tibial was very angulated where it was diseased and this was difficult to cannulate and required a lot of maneuvers with different wires and catheters.  Ultimately we finally got a V18 wire down the posterior tibial and the proximal to mid posterior tibial that was heavily diseased with multifocal high-grade stenosis was treated with a 2.5 mm Sterling to nominal pressure for 2 minutes in each segment.  Additional nitroglycerin was given.  I then elected to treat the mid to distal rihgt SFA with a 5 mm drug-coated balloon Impact for 3 minutes with no dissection and no significant residual stenosis.  Final imaging showed preserved runoff down the PT that is now in line.  His flow is sluggish with his heart failure.  Wires and catheters were removed.  Short 6 French sheath was placed in the left groin and a mynx closure device was deployed.   Plan: Patient is optimized in the right lower extremity after SFA and posterior tibial intervention with inline flow down the right lower extremity via the posterior tibial.  The left leg has inline flow down the peroneal as his only tibial runoff and is optimized.  Have loaded on Plavix plus  aspirin and statin.  If right foot wounds fail to heal would be a candidate for retrograde right AT access.  Marty Heck, MD [facility] Office: [phone]    DG CHEST PORT 1 VIEW  Result Date: 11/05/2021 CLINICAL DATA:  [id number] with shortness of breath, coughing. EXAM: PORTABLE CHEST 1 VIEW COMPARISON:  Portable chest 11/02/2021 FINDINGS: 5:17 a.m. Bilateral moderate layering pleural effusions are both increased over the prior study. There is overlying hazy atelectasis or airspace disease in the hypoinflated lower zones. The upper lung zones are generally clear. There is mild cardiomegaly, mild central vascular fullness without overt edema. The mediastinum is unchanged. There is calcification of the transverse aorta. Thoracic spondylosis. No pneumothorax. IMPRESSION: 1. Increased bilateral moderate layering pleural effusions with overlying hazy atelectasis or airspace disease. 2. Cardiomegaly with mild central vascular fullness. No overt edema. 3. Aortic atherosclerosis. Electronically Signed   By: Telford Nab M.D.   On: 11/05/2021 07:23   VAS [place] [name] WITH/WO TBI  Result Date: 11/04/2021  LOWER EXTREMITY DOPPLER STUDY Patient Name:  Paul Gonzalez  Date of Exam:   11/04/2021 Medical Rec #: [id number]        Accession #:    [id number] Date of Birth: 09-22-1965        Patient Gender: M Patient Age:   33 years Exam Location:  [facility] Procedure:      VAS [place] [name] WITH/WO TBI Referring Phys: MARCUS DUDA --------------------------------------------------------------------------------  Indications: Gangrene.  Comparison Study: no prior Performing Technologist: Archie Patten RVS  Examination Guidelines: A complete evaluation includes at minimum, Doppler waveform signals and systolic blood pressure reading at the level of bilateral brachial, anterior tibial, and posterior tibial arteries, when vessel segments are accessible. Bilateral testing is considered an  integral part of a complete examination. Photoelectric Plethysmograph (PPG) waveforms and toe systolic pressure readings are included as required and additional duplex testing as needed. Limited examinations for reoccurring indications may be performed as noted.  [name] Findings: +---------+------------------+-----+---------+--------+ Right    Rt Pressure (mmHg)IndexWaveform Comment  +---------+------------------+-----+---------+--------+  Brachial 99                     triphasic         +---------+------------------+-----+---------+--------+ PTA      255               2.58 biphasic          +---------+------------------+-----+---------+--------+ DP       255               2.58 biphasic          +---------+------------------+-----+---------+--------+ Great Toe                       Absent            +---------+------------------+-----+---------+--------+ +---------+------------------+-----+-------------------+-------+ Left     Lt Pressure (mmHg)IndexWaveform           Comment +---------+------------------+-----+-------------------+-------+ Brachial                                           avf     +---------+------------------+-----+-------------------+-------+ PTA      90                0.91 dampened monophasic        +---------+------------------+-----+-------------------+-------+ DP       68                0.69 dampened monophasic        +---------+------------------+-----+-------------------+-------+ Great Toe                       Absent                     +---------+------------------+-----+-------------------+-------+ +-------+-----------+-----------+------------+------------+ [name]/TBIToday's ABIToday's TBIPrevious ABIPrevious TBI +-------+-----------+-----------+------------+------------+ Right  2.58                                           +-------+-----------+-----------+------------+------------+ Left   0.91                                            +-------+-----------+-----------+------------+------------+  Summary: Right: Resting right ankle-brachial index indicates noncompressible right lower extremity arteries. The right toe-brachial index is absent. Left: Although ankle brachial indices show mild disease, arterial Doppler waveforms at the ankle suggest some component of arterial occlusive disease. The left toe-brachial index is absent.  *See table(s) above for measurements and observations.  Electronically signed by Monica Martinez MD on 11/04/2021 at 12:06:49 PM.    Final    Medications:  sodium chloride     sodium chloride 100 mL/hr at 11/05/21 0609   sodium chloride     anticoagulant sodium citrate     piperacillin-tazobactam (ZOSYN)  IV 2.25 g (11/05/21 0049)    aspirin EC  81 mg Oral Daily   atorvastatin  20 mg Oral Daily   calcitRIOL  0.25 mcg Oral Q M,W,F-HD   calcium acetate  667 mg Oral TID WC   Chlorhexidine Gluconate Cloth  6 each Topical Q0600   Chlorhexidine Gluconate Cloth  6 each Topical Q0600   chlorpheniramine-HYDROcodone  5 mL Oral Once   [START ON  11/20/2021] clopidogrel  75 mg Oral Daily   feeding supplement (GLUCERNA SHAKE)  237 mL Oral TID BM   ferrous sulfate  325 mg Oral Q breakfast   insulin aspart  0-5 Units Subcutaneous QHS   insulin aspart  0-6 Units Subcutaneous TID WC   insulin glargine-yfgn  8 Units Subcutaneous QHS   midodrine  10 mg Oral TID WC   pantoprazole  40 mg Oral Daily   sodium chloride flush  3 mL Intravenous Q12H     Dialysis Orders: 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 S/P R. posterior tibial artery angioplasty per Dr. Carlis Abbott. Per [facility].   B Fragilis Bacteremia-ABX per primary Acute systolic HF-management per cardiology. Optimize volume with  HD.  Bilateral Pleural Effusion: S/P R thoracentesis 11/02/2021. Needs lower EDW to prevent re-accumulation of fluid.  Aortic Valve disease-W/U per Cardiology.  ESRD -MWF. Missed HD last PM D/T scheduling. HD today and short HD tomorrow to resume MWF schedule.  Anemia -HGB 11.2 10/31 down to 8.6 today. Denies acute blood loss. Give ESA with HD today. Hold venofer in setting of sepsis.  Secondary hyperparathyroidism - PO4 at goal. Continue binder, VDRA Hypotension/Volume-SBP 80s. Continue midodrine for hypotension. Give Midodrine 20 mg PO with HD today. Edema hips,thighs abdomen but lungs clear.  UF as tolerated.  Nutrition - Very low albumin. Add protein supplements DMT2-management per primary  Kathan Kirker H. Dorian Duval NP-C 11/05/2021, 2:40 PM  [employer] [phone]

## 2021-11-05 NOTE — Progress Notes (Signed)
   11/05/21 0951  Vitals  Temp 97.8 F (36.6 C)  Temp Source Oral  BP (!) 92/45  MAP (mmHg) (!) 60  BP Location Right Arm  BP Method Automatic  Patient Position (if appropriate) Lying  Pulse Rate 90  Pulse Rate Source Monitor  ECG Heart Rate 85  Resp 18  Level of Consciousness  Level of Consciousness Alert  MEWS COLOR  MEWS Score Color Green  Oxygen Therapy  SpO2 92 %  O2 Device Room Air  MEWS Score  MEWS Temp 0  MEWS Systolic 1  MEWS Pulse 0  MEWS RR 0  MEWS LOC 0  MEWS Score 1   Pt back to the unit from procedure, Pt alert and verbally responsive. Left groin site level 0 with no hematoma, bruising, ecchymosis or active bleeding noted. Clean dry gauze intact remains unremarkable. Pt LLE neuro intact. Per report, Pt is to remain on bedrest with LLE flat for 2 hours till 1145 am. Pt educated and voices understanding. Will continue to monitor pt per order and protocol. Report given to pt's assigned RN. Delia Heady RN

## 2021-11-05 NOTE — Progress Notes (Signed)
[facility]  Subjective  - no complaints.   Objective (!) 93/50 88 98.1 F (36.7 C) (Oral) 20 92%  Intake/Output Summary (Last 24 hours) at 11/05/2021 0748 Last data filed at 11/05/2021 0355 Gross per 24 hour  Intake 410 ml  Output --  Net 410 ml           Laboratory Lab Results: Recent Labs    11/04/21 0053 11/05/21 0045  WBC 13.0* 16.6*  HGB 8.6* 10.9*  HCT 27.1* 34.7*  PLT 119* 150   BMET Recent Labs    11/04/21 0053 11/05/21 0045  NA 137 133*  K 3.4* 4.9  CL 108 93*  CO2 20* 23  GLUCOSE 174* 217*  BUN 39* 62*  CREATININE 4.60* 7.37*  CALCIUM 6.8* 9.4    COAG Lab Results  Component Value Date   INR 1.6 (H) 10/21/2021   INR 1.16 03/02/2013   No results found for: "PTT"  Assessment/Planning:  56 year old male with diabetes and end-stage renal disease with bilateral lower extremity tissue loss as pictured above.  Plan aortogram with lower extremity arteriogram and will evaluate both legs.  Initial focus on the right leg today as he has a better left femoral pulse for transfemoral access and also the right foot tissue loss appears worse.  Risk and benefits discussed.  Marty Heck 11/05/2021 7:48 AM --

## 2021-11-05 NOTE — Progress Notes (Signed)
PROGRESS NOTE    Paul Gonzalez  BJS:[id number] DOB: 1965/09/09 DOA: 10/12/2021 PCP: Coral Spikes, DO   Brief Narrative:  56 y.o. male with medical history significant of Hypertension, hyperlipidemia, ESRD on HD (MWF), anemia of chronic disease, diabetic retinopathy with vision loss who presents to the emergency department due to worsening shortness of breath that has been ongoing for about 2 weeks, this was associated with nonproductive cough.  He  also complained of bilateral diabetic foot ulcers and states that he has not been able to get an appointment with a podiatrist.  He denies chest pain, fever, chills, nausea, vomiting, diarrhea or constipation.   ED Course:  In the emergency department, he was intermittently tachypneic, BP was soft at 105/52, other vital signs were within normal range.  Work-up in the ED showed leukocytosis, normocytic anemia, BMP showed sodium 135, potassium 4.9, chloride 93, bicarb 25, glucose 173, BUN 45, creatinine 5.14, albumin 3.3, AST 57, ALT 47, total bilirubin 1.6.  Anion gap 17, lactic acid 2.4 > 2.8, BNP > 4,500.  Influenza A, B, SARS coronavirus 2 was negative.   Right foot x-ray showed no fracture or dislocation of the right foot.  No radiographic findings of osteomyelitis.  Soft tissue wound over the medial great toe.  Soft tissue edema of the forefoot.  Chest x-ray showed cardiomegaly without failure.  Bilateral pleural effusion and associated airspace disease, likely atelectasis, right greater than left.  He was treated with IV ceftriaxone, vancomycin and azithromycin.  Midodrine was given.  IV hydration was provided.  Hospitalist was asked to admit patient for further evaluation and management.  Orthopedic surgeon (Dr. Erlinda Hong) was consulted and patient will be seen when he arrives at [facility].    **Interim History Orthopedic surgery evaluated and recommended ABIs and also recommended vascular evaluation.  Vascular surgery is now evaluating and plan is for  aortogram with lower extremity arteriogram.  Nephrology is also been consulted and plan is for dialysis later today.   Assessment and Plan: No notes have been filed under this hospital service. Service: Hospitalist  Sepsis secondary to Gangrene Right Toe with Cellulitis -Patient will likely require amputation of gangrenous tissue -Continue antibiotics for B fragilis coverage with IV zosyn -Plan is for Aortogram with lower extremity arteriogram with possible intervention tomorrow -[name]'s ordered and showed and reveals [place] vessels on the right with biphasic waveforms and dampened monophasic waveforms on the left.  -Patient undergoing an aortogram with lower extremity arteriogram today given that there is tissue loss and he had a right posterior tibial artery angioplasty and right mid to distal SFV drug-coated balloon angioplasty and has been optimized in the right lower extremity after SFA and posterior tibial intervention with inline flow down the right lower extremity with via the posterior tibial; patient was loaded with Plavix and aspirin and statin and they feel that if the right foot wounds fail to heal he would be a candidate for retrograde right AT access -Orthopedic Surgery consulted and will follow up after Vascular Surgery evaluation   New finding of Cardiomyopathy  Acute Systolic CHF/HFrEF Volume overload -Fluid management with hemodialysis  -Midodrine dose increased due to soft BPs limiting volume removal for HD -Echo with findings of change in EF now down to 20-25% with severely decreased function of the left ventricle demonstrates global hypokinesis and left ventricular diastolic parameters consistent with grade 3 diastolic dysfunction and restricted as well as the right ventricular systolic function is mildly reduced; patient's aortic valve regurgitation is moderate  to severe and moderate to severe aortic valve stenosis was noted with a gradient of 36 mmHg metrics despite decrease  stroke-volume index -BNP was >4,500 -Inpatient cardiology consultation requested -Pt may require TEE to better assess for valvular disease and cardiology recommending and planning for this later this week -Appreciate cardiology team recs; they will follow him at [facility]  -Volume maintenance is via dialysis and he underwent thoracentesis of the right pleural effusion on 11/02/2021 with removal 1.5 L of fluid. -He is not on any GDMT due to hypotension requiring midodrine and ESRD -Cardiology is unclear whether cardiomyopathy secondary to progressive aortic valve disease but they would not pursue any invasive work-up at this time given his acute bacteremia   B Fragilis Bacteremia -My colleague discussed with Pharmacist and the patient waschanged to IV Zosyn for better coverage -Patient remains on antibiotic coverage with IV Zosyn and WBC is trending upwards in 1-16.6 is again stable at 16.6 -Cardiology recommending TEE given his bacteremia and has echo that was different from the one done in March and there unable to perform it at the same time as a peripheral arteriogram so they are planning for this on Friday, 11/04/2021   ESRD on HD Metabolic Acidosis -He is on schedule with MWF HD treatments -Nephrology was consulted regarding inpatient HD treatments -Electrolytes are stable at this time and Na+ improved from 132 -> 137 but K+ is slightly low and is 3.4 -Patient's BUN/Cr went from 63/6.21 -> 40/5.04 -> 39/4.60 and now BUNs/creatinine 62/7.37 -Nephrology team has been consulted and plan is for Dialysis and unfortunate was unable to get it yesterday and will go later today after the arteriogram   Hypokalemia -As above -Patient's K+ is now improved to 4.9 -Continue to Monitor and Trend -Repeat CMP in the AM    Lactic Acidosis -Last Lactate down to 2.1 now   Bilateral Pleural Effusions R>L -Thoracentesis ordered for large right effusion, completed 10/30 with 1.5L removed -Continue supportive  care and volume management with HD for now -Not currently with any symptoms of respiratory distress -Repeat CXR done this AM and showed "Increased bilateral moderate layering pleural effusions with overlying hazy atelectasis or airspace disease. Cardiomegaly with mild central vascular fullness. No overt edema. Aortic atherosclerosis." -Manage as per dialysis and may need a repeat thoracentesis   Leukocytosis  -Continue to monitor CBC/diff as response to treating infection -Follow-up blood cultures -Patient's WBC went from 15.8 -> 12.9 -> 13.0 and trended up to 16.6 today x2 -Continue to Monitor and Trend and repeat CBC in the AM    Diabetes mellitus with neurological, renal, vascular complications -Continue very sensitive NovoLog sliding scale before meals and at bedtime SSI coverage, daily lantus 8 units for basal coverage, frequent CBG monitoring -Continue monitor CBGs per protocol and CBGs ranging from 149-225   Chronic Hypotension  -Had resumed Midodrine, dose increased by nephrology due to soft BPs limiting HD volume removal  -Continue to monitor blood pressures per protocol -Last blood pressure reading was 97/56   Normocytic Anemia -Patient's Hgb/Hct went from 11.2/35.2 -> 8.6/27.1 and is now trended back up to 10.9/34.7 is now 10.7/34.0 -Check Anemia Panel in the AM  -Continue to Monitor for S/Sx of Bleeding; No overt bleeding noted -Repeat CBC in the AM    Thrombocytopenia -Patient's [place] Count went from Platelet went from 126 -> 119 and trended up to 150 and is now 161 -Continue to Monitor for S/Sx of Bleeding; No overt bleeding noted -Repeat CBC in the AM  Hypoalbuminemia -Patient's Albumin Level went from 2.7 -> 2.6 -> 2.6 -> 1.7 and is now 2.4 -Continue to Monitor and Trend -Repeat CMP in the AM   Hyponatremia -Patient's sodium is now 133 -Likely to be repleted in dialysis and corrected -Continue monitor and trend and repeat CMP in a.m. -Repeat CMP in the AM    DVT prophylaxis: SCDs Start: 11/02/2021 2114    Code Status: Full Code Family Communication: No family currently at bedside  Disposition Plan:  Level of care: Telemetry Medical Status is: Inpatient Remains inpatient appropriate because: Needs further clinical work-up and improvement by specialists   Consultants:  Orthopedic surgery Vascular surgery Cardiology Nephrology Interventional radiology for thoracentesis  Procedures:  As delineated as above  ECHOCARDIOGRAM IMPRESSIONS     1. Left ventricular ejection fraction, by estimation, is 25 to 30%. The  left ventricle has severely decreased function. The left ventricle  demonstrates global hypokinesis. Left ventricular diastolic parameters are  consistent with Grade III diastolic  dysfunction (restrictive).   2. Right ventricular systolic function is mildly reduced. The right  ventricular size is severely enlarged. There is moderately elevated  pulmonary artery systolic pressure. The estimated right ventricular  systolic pressure is 69.4 mmHg.   3. Left atrial size was severely dilated.   4. Right atrial size was mildly dilated.   5. A small pericardial effusion is present. The pericardial effusion is  circumferential.   6. The mitral valve is abnormal. Mild mitral valve regurgitation. Mild  mitral stenosis. Moderate mitral annular calcification.   7. The aortic valve is calcified. Aortic valve regurgitation is moderate  to severe. Moderate to severe aortic valve stenosis. GRadient of 36 mm Hg  at max despite decreased strok volume index. DVI 0.20.   Comparison(s): Significant changes from prior. Atempting to reach primary  team.   FINDINGS   Left Ventricle: Left ventricular ejection fraction, by estimation, is 25  to 30%. The left ventricle has severely decreased function. The left  ventricle demonstrates global hypokinesis. The left ventricular internal  cavity size was normal in size. There  is no left  ventricular hypertrophy. Left ventricular diastolic parameters  are consistent with Grade III diastolic dysfunction (restrictive).   Right Ventricle: The right ventricular size is severely enlarged. No  increase in right ventricular wall thickness. Right ventricular systolic  function is mildly reduced. There is moderately elevated pulmonary artery  systolic pressure. The tricuspid  regurgitant velocity is 2.82 m/s, and with an assumed right atrial  pressure of 15 mmHg, the estimated right ventricular systolic pressure is  50.3 mmHg.   Left Atrium: Left atrial size was severely dilated.   Right Atrium: Right atrial size was mildly dilated.   Pericardium: A small pericardial effusion is present. The pericardial  effusion is circumferential. Presence of epicardial fat layer.   Mitral Valve: The mitral valve is abnormal. Moderate mitral annular  calcification. Mild mitral valve regurgitation. Mild mitral valve  stenosis.   Tricuspid Valve: The tricuspid valve is normal in structure. Tricuspid  valve regurgitation is mild . No evidence of tricuspid stenosis.   Aortic Valve: The aortic valve is calcified. Aortic valve regurgitation is  moderate to severe. Aortic regurgitation PHT measures 160 msec. Moderate  to severe aortic stenosis is present. Aortic valve mean gradient measures  36.0 mmHg. Aortic valve peak  gradient measures 59.9 mmHg. Aortic valve area, by VTI measures 0.54 cm.   Pulmonic Valve: The pulmonic valve was grossly normal. Pulmonic valve  regurgitation is trivial. No evidence  of pulmonic stenosis.   Aorta: The aortic root is normal in size and structure.   IAS/Shunts: No atrial level shunt detected by color flow Doppler.     LEFT VENTRICLE  PLAX 2D  LVIDd:         5.50 cm  LVIDs:         4.70 cm  LV PW:         1.10 cm  LV IVS:        1.00 cm  LVOT diam:     1.90 cm  LV SV:         46  LV SV Index:   24  LVOT Area:     2.84 cm    LV Volumes (MOD)  LV  vol d, MOD A2C: 177.0 ml  LV vol d, MOD A4C: 211.0 ml  LV vol s, MOD A2C: 116.0 ml  LV vol s, MOD A4C: 133.0 ml  LV SV MOD A2C:     61.0 ml  LV SV MOD A4C:     211.0 ml  LV SV MOD BP:      73.3 ml   RIGHT VENTRICLE  RV S prime:     7.62 cm/s  TAPSE (M-mode): 1.5 cm   LEFT ATRIUM              Index        RIGHT ATRIUM           Index  LA diam:        5.30 cm  2.75 cm/m   RA Area:     21.40 cm  LA Vol (A2C):   148.0 ml 76.76 ml/m  RA Volume:   71.10 ml  36.88 ml/m  LA Vol (A4C):   143.0 ml 74.17 ml/m  LA Biplane Vol: 148.0 ml 76.76 ml/m   AORTIC VALVE  AV Area (Vmax):    0.55 cm  AV Area (Vmean):   0.64 cm  AV Area (VTI):     0.54 cm  AV Vmax:           387.00 cm/s  AV Vmean:          232.500 cm/s  AV VTI:            0.863 m  AV Peak Grad:      59.9 mmHg  AV Mean Grad:      36.0 mmHg  LVOT Vmax:         75.70 cm/s  LVOT Vmean:        52.500 cm/s  LVOT VTI:          0.164 m  LVOT/AV VTI ratio: 0.19  AI PHT:            160 msec    AORTA  Ao Root diam: 3.10 cm   MITRAL VALVE                TRICUSPID VALVE  MV Area (PHT): 6.17 cm     TR Peak grad:   31.8 mmHg  MV Decel Time: 123 msec     TR Vmax:        282.00 cm/s  MR Peak grad: 81.0 mmHg  MR Mean grad: 49.0 mmHg     SHUNTS  MR Vmax:      450.00 cm/s   Systemic VTI:  0.16 m  MR Vmean:     322.0 cm/s    Systemic Diam: 1.90 cm  MV E velocity: 114.00 cm/s  MV  A velocity: 34.70 cm/s  MV E/A ratio:  3.29   Procedure Performed: 1.  Ultrasound-guided access left common femoral artery 2.  Aortogram with catheter selection of aorta 3.  Bilateral lower extremity arteriogram with selection of third order branches in the right lower extremity 4.  Right posterior tibial artery angioplasty (2.5 mm x 150 mm [place]) 5.  Right mid to distal SFA drug-coated balloon angioplasty (5 mm x 150 mm drug-coated impact) 6.  94 minutes of monitored moderate conscious sedation time 7.  Mynx closure of the left common femoral artery     Antimicrobials:  Anti-infectives (From admission, onward)    Start     Dose/Rate Route Frequency Ordered Stop   11/04/21 2300  piperacillin-tazobactam (ZOSYN) IVPB 2.25 g  Status:  Discontinued        2.25 g 100 mL/hr over 30 Minutes Intravenous Every 8 hours 11/03/21 2305 11/04/21 0004   11/04/21 0015  piperacillin-tazobactam (ZOSYN) IVPB 2.25 g        2.25 g 100 mL/hr over 30 Minutes Intravenous Every 8 hours 11/04/21 0005     11/02/21 1800  ceFEPIme (MAXIPIME) 2 g in sodium chloride 0.9 % 100 mL IVPB  Status:  Discontinued        2 g 200 mL/hr over 30 Minutes Intravenous Every M-W-F (1800) 11/01/2021 2119 11/02/21 0947   11/02/21 1200  vancomycin (VANCOREADY) IVPB 750 mg/150 mL  Status:  Discontinued        750 mg 150 mL/hr over 60 Minutes Intravenous Every M-W-F (Hemodialysis) 10/04/2021 2119 11/02/21 0948   11/02/21 1200  piperacillin-tazobactam (ZOSYN) IVPB 2.25 g  Status:  Discontinued        2.25 g 100 mL/hr over 30 Minutes Intravenous Every 8 hours 11/02/21 0952 11/02/21 1014   11/02/21 1200  piperacillin-tazobactam (ZOSYN) 2.25 g in sodium chloride 0.9 % 50 mL IVPB  Status:  Discontinued        2.25 g 100 mL/hr over 30 Minutes Intravenous Every 8 hours 11/02/21 1014 11/03/21 2305   11/02/2021 2130  vancomycin (VANCOREADY) IVPB 500 mg/100 mL        500 mg 100 mL/hr over 60 Minutes Intravenous  Once 11/03/2021 2119 11/01/21 0152   10/13/2021 2100  vancomycin (VANCOREADY) IVPB 1500 mg/300 mL  Status:  Discontinued        1,500 mg 150 mL/hr over 120 Minutes Intravenous  Once 11/01/2021 2057 10/15/2021 2119   10/14/2021 2100  ceFEPIme (MAXIPIME) 2 g in sodium chloride 0.9 % 100 mL IVPB        2 g 200 mL/hr over 30 Minutes Intravenous  Once 10/14/2021 2057 11/01/21 0152   10/26/2021 1630  vancomycin (VANCOCIN) IVPB 1000 mg/200 mL premix        1,000 mg 200 mL/hr over 60 Minutes Intravenous  Once 10/28/2021 1628 10/14/2021 1818   10/17/2021 1530  cefTRIAXone (ROCEPHIN) 2 g in sodium chloride 0.9 % 100  mL IVPB  Status:  Discontinued        2 g 200 mL/hr over 30 Minutes Intravenous Every 24 hours 10/22/2021 1522 11/01/2021 2119   10/08/2021 1530  azithromycin (ZITHROMAX) 500 mg in sodium chloride 0.9 % 250 mL IVPB  Status:  Discontinued        500 mg 250 mL/hr over 60 Minutes Intravenous Every 24 hours 10/25/2021 1522 11/02/21 0947       Subjective: Seen and examined at bedside after his arteriogram and he was doing okay.  Did not know if he could  keep his leg straight.  Denies any chest pain but did feel little short of breath.  Continues to not feel as good today.  No other concerns or points this time.  Objective: Vitals:   11/05/21 1700 11/05/21 1730 11/05/21 1800 11/05/21 1830  BP: 106/63 (!) 113/57 (!) 90/51 (!) 106/53  Pulse: 90 88 88 87  Resp: (!) 21 (!) 22    Temp:      TempSrc:      SpO2: 100% 99% 92% 100%  Weight:      Height:        Intake/Output Summary (Last 24 hours) at 11/05/2021 1935 Last data filed at 11/05/2021 1326 Gross per 24 hour  Intake 630 ml  Output --  Net 630 ml   Filed Weights   11/03/21 1601 11/04/21 0345 11/05/21 0344  Weight: 79.3 kg 79.8 kg 80.8 kg    Examination: Physical Exam:  Constitutional: WN/WD overweight chronically ill-appearing Caucasian male in no acute distress appears little uncomfortable Respiratory: Diminished to auscultation bilaterally with coarse breath sounds, no wheezing, rales, rhonchi or crackles. Normal respiratory effort and patient is not tachypenic. No accessory muscle use.  Unlabored breathing Cardiovascular: RRR, no murmurs / rubs / gallops. S1 and S2 auscultated.  Abdomen: Soft, non-tender, distended secondary body habitus bowel sounds positive.  GU: Deferred. Skin: Foot ulcers noted Neurologic: CN 2-12 grossly intact with no focal deficits. Romberg sign cerebellar reflexes not assessed.  Psychiatric: Normal judgment and insight. Alert and oriented x 3. Normal mood and appropriate affect.   Data Reviewed: I have  personally reviewed following labs and imaging studies  CBC: Recent Labs  Lab 11/02/21 0604 11/03/21 0306 11/04/21 0053 11/05/21 0045 11/05/21 1058  WBC 15.8* 12.9* 13.0* 16.6* 16.6*  NEUTROABS 13.7* 11.0* 11.5* 14.6* 14.7*  HGB 11.1* 11.2* 8.6* 10.9* 10.7*  HCT 34.7* 35.2* 27.1* 34.7* 34.0*  MCV 89.2 90.0 90.9 90.6 89.0  PLT 155 126* 119* 150 878   Basic Metabolic Panel: Recent Labs  Lab 11/01/21 0525 11/02/21 0604 11/03/21 0306 11/04/21 0053 11/05/21 0045  NA 134* 134* 132* 137 133*  K 4.9 5.0 3.9 3.4* 4.9  CL 96* 96* 95* 108 93*  CO2 '25 25 27 '$ 20* 23  GLUCOSE 156* 174* 150* 174* 217*  BUN 49* 63* 40* 39* 62*  CREATININE 5.27* 6.21* 5.04* 4.60* 7.37*  CALCIUM 9.7 9.4 8.8* 6.8* 9.4  MG 2.1  --   --   --  2.2  PHOS 7.0* 7.5* 5.5* 4.2 6.5*   GFR: Estimated Creatinine Clearance: 11.3 mL/min (A) (by C-G formula based on SCr of 7.37 mg/dL (H)). Liver Function Tests: Recent Labs  Lab 10/17/2021 1553 11/01/21 0525 11/02/21 0604 11/03/21 0306 11/04/21 0053 11/05/21 0045  AST 57* 29  --   --   --  27  ALT 47* 34  --   --   --  29  ALKPHOS 112 92  --   --   --  106  BILITOT 1.6* 1.1  --   --   --  1.0  PROT 7.4 6.2*  --   --   --  6.1*  ALBUMIN 3.3* 2.7* 2.6* 2.6* 1.7* 2.4*   No results for input(s): "LIPASE", "AMYLASE" in the last 168 hours. No results for input(s): "AMMONIA" in the last 168 hours. Coagulation Profile: Recent Labs  Lab 10/17/2021 1553  INR 1.6*   Cardiac Enzymes: No results for input(s): "CKTOTAL", "CKMB", "CKMBINDEX", "TROPONINI" in the last 168 hours. BNP (last 3 results)  No results for input(s): "PROBNP" in the last 8760 hours. HbA1C: No results for input(s): "HGBA1C" in the last 72 hours. CBG: Recent Labs  Lab 11/04/21 1624 11/04/21 2046 11/04/21 2250 11/05/21 0609 11/05/21 1125  GLUCAP 149* 225* 221* 154* 171*   Lipid Profile: No results for input(s): "CHOL", "HDL", "LDLCALC", "TRIG", "CHOLHDL", "LDLDIRECT" in the last 72  hours. Thyroid Function Tests: No results for input(s): "TSH", "T4TOTAL", "FREET4", "T3FREE", "THYROIDAB" in the last 72 hours. Anemia Panel: No results for input(s): "VITAMINB12", "FOLATE", "FERRITIN", "TIBC", "IRON", "RETICCTPCT" in the last 72 hours. Sepsis Labs: Recent Labs  Lab 10/23/2021 1553 10/16/2021 1801 10/29/2021 2101  LATICACIDVEN 2.4* 2.8* 2.1*    Recent Results (from the past 240 hour(s))  Resp Panel by RT-PCR (Flu A&B, Covid) Anterior Nasal Swab     Status: None   Collection Time: 10/24/2021  3:22 PM   Specimen: Anterior Nasal Swab  Result Value Ref Range Status   SARS Coronavirus 2 by RT PCR NEGATIVE NEGATIVE Final    Comment: (NOTE) SARS-CoV-2 target nucleic acids are NOT DETECTED.  The SARS-CoV-2 RNA is generally detectable in upper respiratory specimens during the acute phase of infection. The lowest concentration of SARS-CoV-2 viral copies this assay can detect is 138 copies/mL. A negative result does not preclude SARS-Cov-2 infection and should not be used as the sole basis for treatment or other patient management decisions. A negative result may occur with  improper specimen collection/handling, submission of specimen other than nasopharyngeal swab, presence of viral mutation(s) within the areas targeted by this assay, and inadequate number of viral copies(<138 copies/mL). A negative result must be combined with clinical observations, patient history, and epidemiological information. The expected result is Negative.  Fact Sheet for Patients:  EntrepreneurPulse.com.au  Fact Sheet for Healthcare Providers:  IncredibleEmployment.be  This test is no t yet approved or cleared by the [place] FDA and  has been authorized for detection and/or diagnosis of SARS-CoV-2 by FDA under an Emergency Use Authorization (EUA). This EUA will remain  in effect (meaning this test can be used) for the duration of the COVID-19 declaration  under Section 564(b)(1) of the Act, 21 U.S.C.section 360bbb-3(b)(1), unless the authorization is terminated  or revoked sooner.       Influenza A by PCR NEGATIVE NEGATIVE Final   Influenza B by PCR NEGATIVE NEGATIVE Final    Comment: (NOTE) The Xpert Xpress SARS-CoV-2/FLU/RSV plus assay is intended as an aid in the diagnosis of influenza from Nasopharyngeal swab specimens and should not be used as a sole basis for treatment. Nasal washings and aspirates are unacceptable for Xpert Xpress SARS-CoV-2/FLU/RSV testing.  Fact Sheet for Patients: EntrepreneurPulse.com.au  Fact Sheet for Healthcare Providers: IncredibleEmployment.be  This test is not yet approved or cleared by the [place] FDA and has been authorized for detection and/or diagnosis of SARS-CoV-2 by FDA under an Emergency Use Authorization (EUA). This EUA will remain in effect (meaning this test can be used) for the duration of the COVID-19 declaration under Section 564(b)(1) of the Act, 21 U.S.C. section 360bbb-3(b)(1), unless the authorization is terminated or revoked.  Performed at [facility], [address] [place], [place] [address]   Blood Culture (routine x 2)     Status: None   Collection Time: 10/14/2021  3:53 PM   Specimen: BLOOD RIGHT ARM  Result Value Ref Range Status   Specimen Description BLOOD RIGHT ARM  Final   Special Requests   Final    BOTTLES DRAWN AEROBIC AND ANAEROBIC  Blood Culture adequate volume   Culture   Final    NO GROWTH 5 DAYS Performed at [facility], [address], [place], [place] [address]    Report Status 11/05/2021 FINAL  Final  Blood Culture (routine x 2)     Status: Abnormal   Collection Time: 10/24/2021  3:54 PM   Specimen: Right Antecubital; Blood  Result Value Ref Range Status   Specimen Description   Final    RIGHT ANTECUBITAL Performed at [facility], [address], [place], [place] [address]    Special Requests   Final     BOTTLES DRAWN AEROBIC AND ANAEROBIC Blood Culture results may not be optimal due to an excessive volume of blood received in culture bottles Performed at [facility], [address] [place], [place] [address]    Culture  Setup Time   Final    GRAM NEGATIVE RODS Gram Stain Report Called to,Read Back By and Verified With:  J. LONG @ 1512 BY STEPHTR 11/01/21 CRITICAL RESULT CALLED TO, READ BACK BY AND VERIFIED WITH: E PREYER,RN'@0125'$  0/30/23 [place]    Culture (A)  Final    BACTEROIDES FRAGILIS BETA LACTAMASE POSITIVE Performed at [facility], [place] [address], [place], [place] [address]    Report Status 11/03/2021 FINAL  Final  Blood Culture ID Panel (Reflexed)     Status: Abnormal   Collection Time: 10/27/2021  3:54 PM  Result Value Ref Range Status   Enterococcus faecalis NOT DETECTED NOT DETECTED Final   Enterococcus Faecium NOT DETECTED NOT DETECTED Final   Listeria monocytogenes NOT DETECTED NOT DETECTED Final   Staphylococcus species NOT DETECTED NOT DETECTED Final   Staphylococcus aureus (BCID) NOT DETECTED NOT DETECTED Final   Staphylococcus epidermidis NOT DETECTED NOT DETECTED Final   Staphylococcus lugdunensis NOT DETECTED NOT DETECTED Final   Streptococcus species NOT DETECTED NOT DETECTED Final   Streptococcus agalactiae NOT DETECTED NOT DETECTED Final   Streptococcus pneumoniae NOT DETECTED NOT DETECTED Final   Streptococcus pyogenes NOT DETECTED NOT DETECTED Final   A.calcoaceticus-baumannii NOT DETECTED NOT DETECTED Final   Bacteroides fragilis DETECTED (A) NOT DETECTED Final    Comment: CRITICAL RESULT CALLED TO, READ BACK BY AND VERIFIED WITH: E PREYER,RN'@0125'$  11/02/21 [place]    Enterobacterales NOT DETECTED NOT DETECTED Final   Enterobacter cloacae complex NOT DETECTED NOT DETECTED Final   Escherichia coli NOT DETECTED NOT DETECTED Final   Klebsiella aerogenes NOT DETECTED NOT DETECTED Final   Klebsiella oxytoca NOT DETECTED NOT DETECTED Final   Klebsiella pneumoniae  NOT DETECTED NOT DETECTED Final   Proteus species NOT DETECTED NOT DETECTED Final   Salmonella species NOT DETECTED NOT DETECTED Final   Serratia marcescens NOT DETECTED NOT DETECTED Final   Haemophilus influenzae NOT DETECTED NOT DETECTED Final   Neisseria meningitidis NOT DETECTED NOT DETECTED Final   Pseudomonas aeruginosa NOT DETECTED NOT DETECTED Final   Stenotrophomonas maltophilia NOT DETECTED NOT DETECTED Final   Candida albicans NOT DETECTED NOT DETECTED Final   Candida auris NOT DETECTED NOT DETECTED Final   Candida glabrata NOT DETECTED NOT DETECTED Final   Candida krusei NOT DETECTED NOT DETECTED Final   Candida parapsilosis NOT DETECTED NOT DETECTED Final   Candida tropicalis NOT DETECTED NOT DETECTED Final   Cryptococcus neoformans/gattii NOT DETECTED NOT DETECTED Final    Comment: Performed at [facility], [place] [address], [place],  [address]  Culture, body fluid w Gram Stain-bottle     Status: None (Preliminary result)  Collection Time: 11/02/21  8:54 AM   Specimen: Pleura  Result Value Ref Range Status   Specimen Description PLEURAL BOTTLES DRAWN AEROBIC AND ANAEROBIC  Final   Special Requests Blood Culture adequate volume  Final   Culture   Final    NO GROWTH 3 DAYS Performed at [facility], [address], [place], [place] [address]    Report Status PENDING  Incomplete  Gram stain     Status: None   Collection Time: 11/02/21  8:54 AM   Specimen: Pleura  Result Value Ref Range Status   Specimen Description PLEURAL  Final   Special Requests NONE  Final   Gram Stain   Final    NO ORGANISMS SEEN WBC PRESENT, PREDOMINANTLY PMN CYTOSPIN SMEAR Performed at [facility], [address], [place], [place] [address]    Report Status 11/02/2021 FINAL  Final     Radiology Studies: PERIPHERAL VASCULAR CATHETERIZATION  Result Date: 11/05/2021 Table formatting from the original result was not included. Images from the original result were not included.    Patient name: Bertel Venard          MRN: [id number]        DOB: 26-Feb-1965          Sex: male  11/05/2021 Pre-operative Diagnosis: Critical limb ischemia of the bilateral lower extremities with diabetic foot ulcers Post-operative diagnosis:  Same Surgeon:  Marty Heck, MD Procedure Performed: 1.  Ultrasound-guided access left common femoral artery 2.  Aortogram with catheter selection of aorta 3.  Bilateral lower extremity arteriogram with selection of third order branches in the right lower extremity 4.  Right posterior tibial artery angioplasty (2.5 mm x 150 mm Sterling) 5.  Right mid to distal SFA drug-coated balloon angioplasty (5 mm x 150 mm drug-coated impact) 6.  94 minutes of monitored moderate conscious sedation time 7.  Mynx closure of the left common femoral artery  Indications: 56 year old male with end-stage renal disease and diabetes that presents with bilateral lower extremity tissue loss as previously documented.  He is followed by Dr. Sharol Given with orthopedic surgery for his foot ulcers.  He presents today for aortogram, lower extremity arteriogram with initial focus on the right leg.  Risks benefits discussed.  Findings:  Aortogram showed no flow-limiting stenosis in the aortoiliac segment.  The left renal artery was somewhat visualized.  On the right patient had a patent common femoral, profunda, SFA.  The mid to distal SFA had about a 60-70% stenosis that was multifocal.  Popliteal artery is calcified and diseased but no flow-limiting stenosis.  Dominant runoff is in the posterior tibial that has a multifocal high-grade stenosis greater than 80% in the proximal to mid segment that is diseased for a long segment.  The peroneal is diminutive and occludes well above the ankle.  The anterior tibial has a proximal occlusion.  Small vessel disease in the foot.  Ultimately the right posterior tibial was angioplastied with a 2.5 mm Sterling with excellent results.  The mid to distal SFA was then  treated with a 5 mm drug-coated balloon.  Inline flow down the right lower extremity through the posterior tibial.  Left lower extremity shows a patent common femoral, profunda, SFA, popliteal and single vessel runoff in the peroneal that gives a collateral off to the PT at the ankle. Small vessel disease in the foot.             Procedure:  The patient was identified in the holding area and  taken to room 8.  The patient was then placed supine on the table and prepped and draped in the usual sterile fashion.  A time out was called.  The patient received Versed and fentanyl for moderate sedation.  I was present for all of sedation and vital signs were monitored including heart rate, respiratory rate, oxygenation and blood pressure.  Ultrasound was used to evaluate the left common femoral artery.  It was patent .  A digital ultrasound image was acquired.  A micropuncture needle was used to access the left common femoral artery under ultrasound guidance.  An 018 wire was advanced without resistance and a micropuncture sheath was placed.  The 018 wire was removed and a benson wire was placed.  The micropuncture sheath was exchanged for a 5 french sheath.  An omniflush catheter was advanced over the wire to the level of L-1.  An abdominal angiogram was obtained.  Next the catheter was pulled down and bilateral lower extremity runoff obtained.  Pertinent findings are noted above.  Initial focus today was on the right leg.  We used a Glidewire advantage to cross the aortic bifurcation with an Omni Flush catheter and ultimately got a wire into the right SFA and exchanged for a long 6 French sheath in the left groin over the aortic bifurcation.  Patient was given 100 units/kg IV heparin.  I then exchanged for a V18 wire with a 018 quick cross and got down to the tibial vessels.  The takeoff of the posterior tibial was very angulated where it was diseased and this was difficult to cannulate and required a lot of maneuvers  with different wires and catheters.  Ultimately we finally got a V18 wire down the posterior tibial and the proximal to mid posterior tibial that was heavily diseased with multifocal high-grade stenosis was treated with a 2.5 mm Sterling to nominal pressure for 2 minutes in each segment.  Additional nitroglycerin was given.  I then elected to treat the mid to distal rihgt SFA with a 5 mm drug-coated balloon Impact for 3 minutes with no dissection and no significant residual stenosis.  Final imaging showed preserved runoff down the PT that is now in line.  His flow is sluggish with his heart failure.  Wires and catheters were removed.  Short 6 French sheath was placed in the left groin and a mynx closure device was deployed.   Plan: Patient is optimized in the right lower extremity after SFA and posterior tibial intervention with inline flow down the right lower extremity via the posterior tibial.  The left leg has inline flow down the peroneal as his only tibial runoff and is optimized.  Have loaded on Plavix plus aspirin and statin.  If right foot wounds fail to heal would be a candidate for retrograde right AT access.  Marty Heck, MD [facility] Office: [phone]    DG CHEST PORT 1 VIEW  Result Date: 11/05/2021 CLINICAL DATA:  [id number] with shortness of breath, coughing. EXAM: PORTABLE CHEST 1 VIEW COMPARISON:  Portable chest 11/02/2021 FINDINGS: 5:17 a.m. Bilateral moderate layering pleural effusions are both increased over the prior study. There is overlying hazy atelectasis or airspace disease in the hypoinflated lower zones. The upper lung zones are generally clear. There is mild cardiomegaly, mild central vascular fullness without overt edema. The mediastinum is unchanged. There is calcification of the transverse aorta. Thoracic spondylosis. No pneumothorax. IMPRESSION: 1. Increased bilateral moderate layering pleural effusions with overlying hazy  atelectasis or  airspace disease. 2. Cardiomegaly with mild central vascular fullness. No overt edema. 3. Aortic atherosclerosis. Electronically Signed   By: Telford Nab M.D.   On: 11/05/2021 07:23   VAS [place] [name] WITH/WO TBI  Result Date: 11/04/2021  LOWER EXTREMITY DOPPLER STUDY Patient Name:  Paul Gonzalez  Date of Exam:   11/04/2021 Medical Rec #: [id number]        Accession #:    [id number] Date of Birth: 02-20-1965        Patient Gender: M Patient Age:   20 years Exam Location:  [facility] Procedure:      VAS [place] [name] WITH/WO TBI Referring Phys: MARCUS DUDA --------------------------------------------------------------------------------  Indications: Gangrene.  Comparison Study: no prior Performing Technologist: Archie Patten RVS  Examination Guidelines: A complete evaluation includes at minimum, Doppler waveform signals and systolic blood pressure reading at the level of bilateral brachial, anterior tibial, and posterior tibial arteries, when vessel segments are accessible. Bilateral testing is considered an integral part of a complete examination. Photoelectric Plethysmograph (PPG) waveforms and toe systolic pressure readings are included as required and additional duplex testing as needed. Limited examinations for reoccurring indications may be performed as noted.  [name] Findings: +---------+------------------+-----+---------+--------+ Right    Rt Pressure (mmHg)IndexWaveform Comment  +---------+------------------+-----+---------+--------+ Brachial 99                     triphasic         +---------+------------------+-----+---------+--------+ PTA      255               2.58 biphasic          +---------+------------------+-----+---------+--------+ DP       255               2.58 biphasic          +---------+------------------+-----+---------+--------+ Great Toe                       Absent            +---------+------------------+-----+---------+--------+  +---------+------------------+-----+-------------------+-------+ Left     Lt Pressure (mmHg)IndexWaveform           Comment +---------+------------------+-----+-------------------+-------+ Brachial                                           avf     +---------+------------------+-----+-------------------+-------+ PTA      90                0.91 dampened monophasic        +---------+------------------+-----+-------------------+-------+ DP       68                0.69 dampened monophasic        +---------+------------------+-----+-------------------+-------+ Great Toe                       Absent                     +---------+------------------+-----+-------------------+-------+ +-------+-----------+-----------+------------+------------+ [name]/TBIToday's ABIToday's TBIPrevious ABIPrevious TBI +-------+-----------+-----------+------------+------------+ Right  2.58                                           +-------+-----------+-----------+------------+------------+ Left   0.91                                           +-------+-----------+-----------+------------+------------+  Summary: Right: Resting right ankle-brachial index indicates noncompressible right lower extremity arteries. The right toe-brachial index is absent. Left: Although ankle brachial indices show mild disease, arterial Doppler waveforms at the ankle suggest some component of arterial occlusive disease. The left toe-brachial index is absent.  *See table(s) above for measurements and observations.  Electronically signed by Monica Martinez MD on 11/04/2021 at 12:06:49 PM.    Final     Scheduled Meds:  aspirin EC  81 mg Oral Daily   atorvastatin  20 mg Oral Daily   calcitRIOL  0.25 mcg Oral Q M,W,F-HD   calcium acetate  667 mg Oral TID WC   Chlorhexidine Gluconate Cloth  6 each Topical Q0600   Chlorhexidine Gluconate Cloth  6 each Topical Q0600   chlorpheniramine-HYDROcodone  5 mL Oral Once   [START  ON 12/03/2021] clopidogrel  75 mg Oral Daily   feeding supplement (GLUCERNA SHAKE)  237 mL Oral TID BM   ferrous sulfate  325 mg Oral Q breakfast   insulin aspart  0-5 Units Subcutaneous QHS   insulin aspart  0-6 Units Subcutaneous TID WC   insulin glargine-yfgn  8 Units Subcutaneous QHS   midodrine  10 mg Oral TID WC   pantoprazole  40 mg Oral Daily   sodium chloride flush  3 mL Intravenous Q12H   Continuous Infusions:  sodium chloride     sodium chloride 100 mL/hr at 11/05/21 0609   sodium chloride     anticoagulant sodium citrate     piperacillin-tazobactam (ZOSYN)  IV 2.25 g (11/05/21 0049)    LOS: 5 days   Raiford Noble, DO [facility] Available via Epic secure chat 7am-7pm After these hours, please refer to coverage provider listed on [email or web address] 11/05/2021, 7:35 PM

## 2021-11-05 NOTE — Progress Notes (Signed)
Pharmacy Antibiotic Note  Paul Gonzalez is a 56 y.o. male admitted on 10/12/2021 with B fragilis bacteremia/cellulitis  Pharmacy has been consulted for Zosyn dosing. Pt ESRD on HD MWF.  Plan: Zosyn 2.25g IV every 8 hours Monitor labs, c/s, and patient improvement  Height: '5\' 9"'$  (175.3 cm) Weight: 80.8 kg (178 lb 2.1 oz) IBW/kg (Calculated) : 70.7  Temp (24hrs), Avg:98.1 F (36.7 C), Min:97.8 F (36.6 C), Max:98.3 F (36.8 C)  Recent Labs  Lab 10/22/2021 1553 10/11/2021 1801 10/18/2021 2101 11/01/21 0525 11/02/21 0604 11/03/21 0306 11/04/21 0053 11/05/21 0045  WBC 19.6*  --   --  16.0* 15.8* 12.9* 13.0* 16.6*  CREATININE 5.14*  --   --  5.27* 6.21* 5.04* 4.60* 7.37*  LATICACIDVEN 2.4* 2.8* 2.1*  --   --   --   --   --      Estimated Creatinine Clearance: 11.3 mL/min (A) (by C-G formula based on SCr of 7.37 mg/dL (H)).    No Known Allergies  Antimicrobials this admission: Zosyn 10/30 >> Vanco/Cefepime 10/28 >> 10/30 CTX/azith 10/28   Microbiology results: 10/28 blood>> gram neg rods BCID: bacteroides  Arrie Senate, PharmD, BCPS, [facility] [place] Clinical Pharmacist [phone] Please check AMION for all [place] numbers 11/05/2021

## 2021-11-05 NOTE — Op Note (Signed)
Patient name: Paul Gonzalez MRN: [id number] DOB: [birth year] Sex: male  11/05/2021 Pre-operative Diagnosis: Critical limb ischemia of the bilateral lower extremities with diabetic foot ulcers Post-operative diagnosis:  Same Surgeon:  Marty Heck, MD Procedure Performed: 1.  Ultrasound-guided access left common femoral artery 2.  Aortogram with catheter selection of aorta 3.  Bilateral lower extremity arteriogram with selection of third order branches in the right lower extremity 4.  Right posterior tibial artery angioplasty (2.5 mm x 150 mm Sterling) 5.  Right mid to distal SFA drug-coated balloon angioplasty (5 mm x 150 mm drug-coated impact) 6.  94 minutes of monitored moderate conscious sedation time 7.  Mynx closure of the left common femoral artery  Indications: 56 year old male with end-stage renal disease and diabetes that presents with bilateral lower extremity tissue loss as previously documented.  He is followed by Dr. Sharol Given with orthopedic surgery for his foot ulcers.  He presents today for aortogram, lower extremity arteriogram with initial focus on the right leg.  Risks benefits discussed.  Findings:   Aortogram showed no flow-limiting stenosis in the aortoiliac segment.  The left renal artery was somewhat visualized.  On the right patient had a patent common femoral, profunda, SFA.  The mid to distal SFA had about a 60-70% stenosis that was multifocal.  Popliteal artery is calcified and diseased but no flow-limiting stenosis.  Dominant runoff is in the posterior tibial that has a multifocal high-grade stenosis greater than 80% in the proximal to mid segment that is diseased for a long segment.  The peroneal is diminutive and occludes well above the ankle.  The anterior tibial has a proximal occlusion.  Small vessel disease in the foot.  Ultimately the right posterior tibial was angioplastied with a 2.5 mm Sterling with excellent results.  The mid to distal SFA was  then treated with a 5 mm drug-coated balloon.  Inline flow down the right lower extremity through the posterior tibial.  Left lower extremity shows a patent common femoral, profunda, SFA, popliteal and single vessel runoff in the peroneal that gives a collateral off to the PT at the ankle. Small vessel disease in the foot.   Procedure:  The patient was identified in the holding area and taken to room 8.  The patient was then placed supine on the table and prepped and draped in the usual sterile fashion.  A time out was called.  The patient received Versed and fentanyl for moderate sedation.  I was present for all of sedation and vital signs were monitored including heart rate, respiratory rate, oxygenation and blood pressure.  Ultrasound was used to evaluate the left common femoral artery.  It was patent .  A digital ultrasound image was acquired.  A micropuncture needle was used to access the left common femoral artery under ultrasound guidance.  An 018 wire was advanced without resistance and a micropuncture sheath was placed.  The 018 wire was removed and a benson wire was placed.  The micropuncture sheath was exchanged for a 5 french sheath.  An omniflush catheter was advanced over the wire to the level of L-1.  An abdominal angiogram was obtained.  Next the catheter was pulled down and bilateral lower extremity runoff obtained.  Pertinent findings are noted above.  Initial focus today was on the right leg.  We used a Glidewire advantage to cross the aortic bifurcation with an Omni Flush catheter and ultimately got a wire into the right SFA and exchanged for a long  6 French sheath in the left groin over the aortic bifurcation.  Patient was given 100 units/kg IV heparin.  I then exchanged for a V18 wire with a 018 quick cross and got down to the tibial vessels.  The takeoff of the posterior tibial was very angulated where it was diseased and this was difficult to cannulate and required a lot of maneuvers with  different wires and catheters.  Ultimately we finally got a V18 wire down the posterior tibial and the proximal to mid posterior tibial that was heavily diseased with multifocal high-grade stenosis was treated with a 2.5 mm Sterling to nominal pressure for 2 minutes in each segment.  Additional nitroglycerin was given.  I then elected to treat the mid to distal rihgt SFA with a 5 mm drug-coated balloon Impact for 3 minutes with no dissection and no significant residual stenosis.  Final imaging showed preserved runoff down the PT that is now in line.  His flow is sluggish with his heart failure.  Wires and catheters were removed.  Short 6 French sheath was placed in the left groin and a mynx closure device was deployed.   Plan: Patient is optimized in the right lower extremity after SFA and posterior tibial intervention with inline flow down the right lower extremity via the posterior tibial.  The left leg has inline flow down the peroneal as his only tibial runoff and is optimized.  Have loaded on Plavix plus aspirin and statin.  If right foot wounds fail to heal would be a candidate for retrograde right AT access.  Marty Heck, MD [facility] Office: [phone]

## 2021-11-06 ENCOUNTER — Encounter (HOSPITAL_COMMUNITY): Payer: Self-pay | Admitting: Internal Medicine

## 2021-11-06 ENCOUNTER — Inpatient Hospital Stay (HOSPITAL_COMMUNITY): Payer: Medicare Other

## 2021-11-06 ENCOUNTER — Encounter (HOSPITAL_COMMUNITY): Admission: EM | Disposition: E | Payer: Self-pay | Source: Home / Self Care | Attending: Internal Medicine

## 2021-11-06 ENCOUNTER — Inpatient Hospital Stay (HOSPITAL_COMMUNITY): Payer: Medicare Other | Admitting: Anesthesiology

## 2021-11-06 DIAGNOSIS — J90 Pleural effusion, not elsewhere classified: Secondary | ICD-10-CM | POA: Diagnosis not present

## 2021-11-06 DIAGNOSIS — R7989 Other specified abnormal findings of blood chemistry: Secondary | ICD-10-CM | POA: Diagnosis not present

## 2021-11-06 DIAGNOSIS — I10 Essential (primary) hypertension: Secondary | ICD-10-CM | POA: Diagnosis not present

## 2021-11-06 DIAGNOSIS — N186 End stage renal disease: Secondary | ICD-10-CM | POA: Diagnosis not present

## 2021-11-06 LAB — COMPREHENSIVE METABOLIC PANEL
ALT: 23 U/L (ref 0–44)
AST: 25 U/L (ref 15–41)
Albumin: 5.5 g/dL — ABNORMAL HIGH (ref 3.5–5.0)
Alkaline Phosphatase: 88 U/L (ref 38–126)
Anion gap: 24 — ABNORMAL HIGH (ref 5–15)
BUN: 33 mg/dL — ABNORMAL HIGH (ref 6–20)
CO2: 21 mmol/L — ABNORMAL LOW (ref 22–32)
Calcium: 7.9 mg/dL — ABNORMAL LOW (ref 8.9–10.3)
Chloride: 88 mmol/L — ABNORMAL LOW (ref 98–111)
Creatinine, Ser: 4.58 mg/dL — ABNORMAL HIGH (ref 0.61–1.24)
GFR, Estimated: 14 mL/min — ABNORMAL LOW (ref >60)
Glucose, Bld: 200 mg/dL — ABNORMAL HIGH (ref 70–99)
Potassium: 3.3 mmol/L — ABNORMAL LOW (ref 3.5–5.1)
Sodium: 133 mmol/L — ABNORMAL LOW (ref 135–145)
Total Bilirubin: 1.3 mg/dL — ABNORMAL HIGH (ref 0.3–1.2)
Total Protein: 8.3 g/dL — ABNORMAL HIGH (ref 6.5–8.1)

## 2021-11-06 LAB — GLUCOSE, CAPILLARY
Glucose-Capillary: 150 mg/dL — ABNORMAL HIGH (ref 70–99)
Glucose-Capillary: 170 mg/dL — ABNORMAL HIGH (ref 70–99)
Glucose-Capillary: 176 mg/dL — ABNORMAL HIGH (ref 70–99)

## 2021-11-06 LAB — CBC WITH DIFFERENTIAL/PLATELET
Abs Immature Granulocytes: 0.09 10*3/uL — ABNORMAL HIGH (ref 0.00–0.07)
Basophils Absolute: 0 10*3/uL (ref 0.0–0.1)
Basophils Relative: 0 %
Eosinophils Absolute: 0.1 10*3/uL (ref 0.0–0.5)
Eosinophils Relative: 1 %
HCT: 31 % — ABNORMAL LOW (ref 39.0–52.0)
Hemoglobin: 9.8 g/dL — ABNORMAL LOW (ref 13.0–17.0)
Immature Granulocytes: 1 %
Lymphocytes Relative: 2 %
Lymphs Abs: 0.2 10*3/uL — ABNORMAL LOW (ref 0.7–4.0)
MCH: 28.5 pg (ref 26.0–34.0)
MCHC: 31.6 g/dL (ref 30.0–36.0)
MCV: 90.1 fL (ref 80.0–100.0)
Monocytes Absolute: 0.8 10*3/uL (ref 0.1–1.0)
Monocytes Relative: 6 %
Neutro Abs: 13.7 10*3/uL — ABNORMAL HIGH (ref 1.7–7.7)
Neutrophils Relative %: 90 %
Platelets: 152 10*3/uL (ref 150–400)
RBC: 3.44 MIL/uL — ABNORMAL LOW (ref 4.22–5.81)
RDW: 20.9 % — ABNORMAL HIGH (ref 11.5–15.5)
WBC: 15 10*3/uL — ABNORMAL HIGH (ref 4.0–10.5)
nRBC: 0 % (ref 0.0–0.2)

## 2021-11-06 LAB — LIPID PANEL
Cholesterol: 63 mg/dL (ref 0–200)
HDL: 16 mg/dL — ABNORMAL LOW (ref >40)
LDL Cholesterol: 29 mg/dL (ref 0–99)
Total CHOL/HDL Ratio: 3.9 RATIO
Triglycerides: 90 mg/dL (ref <150)
VLDL: 18 mg/dL (ref 0–40)

## 2021-11-06 LAB — PHOSPHORUS: Phosphorus: 4.5 mg/dL (ref 2.5–4.6)

## 2021-11-06 LAB — MAGNESIUM: Magnesium: 1.6 mg/dL — ABNORMAL LOW (ref 1.7–2.4)

## 2021-11-06 SURGERY — CANCELLED PROCEDURE

## 2021-11-06 MED ORDER — KETAMINE HCL 50 MG/5ML IJ SOSY
PREFILLED_SYRINGE | INTRAMUSCULAR | Status: AC
  Filled 2021-11-06: qty 5

## 2021-11-06 MED ORDER — DARBEPOETIN ALFA 100 MCG/0.5ML IJ SOSY
100.0000 ug | PREFILLED_SYRINGE | INTRAMUSCULAR | Status: DC
  Filled 2021-11-06: qty 0.5

## 2021-11-06 MED ORDER — MIDODRINE HCL 5 MG PO TABS
10.0000 mg | ORAL_TABLET | Freq: Three times a day (TID) | ORAL | Status: DC
  Administered 2021-11-06 – 2021-11-10 (×13): 10 mg via ORAL
  Filled 2021-11-06 (×13): qty 2

## 2021-11-06 MED ORDER — DIPHENHYDRAMINE HCL 25 MG PO CAPS
25.0000 mg | ORAL_CAPSULE | Freq: Three times a day (TID) | ORAL | Status: DC | PRN
  Administered 2021-11-06 – 2021-11-11 (×5): 25 mg via ORAL
  Filled 2021-11-06 (×5): qty 1

## 2021-11-06 MED ORDER — MIDAZOLAM HCL 2 MG/2ML IJ SOLN
INTRAMUSCULAR | Status: AC
  Filled 2021-11-06: qty 2

## 2021-11-06 MED ORDER — SACCHAROMYCES BOULARDII 250 MG PO CAPS
250.0000 mg | ORAL_CAPSULE | Freq: Two times a day (BID) | ORAL | Status: DC
  Administered 2021-11-06 – 2021-11-10 (×11): 250 mg via ORAL
  Filled 2021-11-06 (×11): qty 1

## 2021-11-06 MED ORDER — PENTAFLUOROPROP-TETRAFLUOROETH EX AERO: 1.0000 | INHALATION_SPRAY | CUTANEOUS | Status: DC | PRN

## 2021-11-06 MED ORDER — ALBUMIN HUMAN 25 % IV SOLN
12.5000 g | Freq: Four times a day (QID) | INTRAVENOUS | Status: DC
  Administered 2021-11-06: 12.5 g via INTRAVENOUS
  Filled 2021-11-06: qty 50

## 2021-11-06 NOTE — Progress Notes (Addendum)
Difficulty increasing and maintaining blood pressure through duration of shift. Patient has received multiple doses of midodrine as well as albumin and a 256m bolus of normal saline. No fewer than five loose bowel movements recorded over course of shift at time of writing. Received verbal order from on-call hospitalist to monitor for worsening leukocytosis, fever, and ongoing bowel movements.  Patient reports owning and continuing to eat [employer] peanut butter purchased around the time of 2022 recall. Instructed patient to have family bring in jar or lot numbers to determine infectious potential.

## 2021-11-06 NOTE — Progress Notes (Signed)
Rounding Note    Patient Name: Paul Gonzalez Date of Encounter: 11/30/2021  [place] Cardiologist: Larae Grooms, MD   Subjective   Breathing is a little short   NO CP   Inpatient Medications    Scheduled Meds:  aspirin EC  81 mg Oral Daily   atorvastatin  20 mg Oral Daily   calcitRIOL  0.25 mcg Oral Q M,W,F-HD   calcium acetate  667 mg Oral TID WC   Chlorhexidine Gluconate Cloth  6 each Topical Q0600   Chlorhexidine Gluconate Cloth  6 each Topical Q0600   chlorpheniramine-HYDROcodone  5 mL Oral Once   clopidogrel  75 mg Oral Daily   darbepoetin ([name]) injection - DIALYSIS  100 mcg Subcutaneous Q Fri-1800   feeding supplement (GLUCERNA SHAKE)  237 mL Oral TID BM   ferrous sulfate  325 mg Oral Q breakfast   insulin aspart  0-5 Units Subcutaneous QHS   insulin aspart  0-6 Units Subcutaneous TID WC   insulin glargine-yfgn  8 Units Subcutaneous QHS   midazolam       midodrine  10 mg Oral TID WC   pantoprazole  40 mg Oral Daily   saccharomyces boulardii  250 mg Oral BID   sodium chloride flush  3 mL Intravenous Q12H   Continuous Infusions:  sodium chloride     anticoagulant sodium citrate     piperacillin-tazobactam (ZOSYN)  IV 2.25 g (11/26/2021 0702)   PRN Meds: sodium chloride, acetaminophen **OR** acetaminophen, acetaminophen, alteplase, anticoagulant sodium citrate, diphenhydrAMINE, guaiFENesin, heparin, hydrALAZINE, HYDROmorphone (DILAUDID) injection, labetalol, lidocaine (PF), lidocaine-prilocaine, melatonin, midazolam, ondansetron **OR** ondansetron (ZOFRAN) IV, ondansetron (ZOFRAN) IV, oxyCODONE, pentafluoroprop-tetrafluoroeth, pentafluoroprop-tetrafluoroeth, sodium chloride flush   Vital Signs    Vitals:   11/14/2021 0833 11/13/2021 0910 12/02/2021 1039 11/09/2021 1310  BP: (!) 89/54 (!) 84/49 (!) 84/49 (!) 84/49  Pulse:   88   Resp:  20 17   Temp:   (!) 97.4 F (36.3 C)   TempSrc:  Oral Oral   SpO2:  96% 96%   Weight:      Height:         Intake/Output Summary (Last 24 hours) at 11/19/2021 1436 Last data filed at 11/19/2021 1332 Gross per 24 hour  Intake 480 ml  Output 1000 ml  Net -520 ml      11/09/2021    4:36 AM 11/05/2021    7:51 PM 11/05/2021    3:44 AM  Last 3 Weights  Weight (lbs) 180 lb 8.9 oz 176 lb 9.4 oz 178 lb 2.1 oz  Weight (kg) 81.9 kg 80.1 kg 80.8 kg      Telemetry     - Personally Reviewed  ECG    No new ECG tracing today. - Personally Reviewed  Physical Exam   GEN: No acute distress.   Neck: No JVD. Cardiac: RRR. Soft I-II/VI murmur noted throughout.  Respiratory: No increased work of breathing. Decreased breath sounds in bilateral bases (left > right). GI: Soft, non-distended, and non-tender. MS: No lower extremity edema. Gangrenous right big toe. Neuro:  No focal deficits. Psych: Normal affect. Responds appropriately.  Labs    High Sensitivity Troponin:  No results for input(s): "TROPONINIHS" in the last 720 hours.   Chemistry Recent Labs  Lab 11/01/21 0525 11/02/21 0604 11/04/21 0053 11/05/21 0045 11/18/2021 0034  NA 134*   < > 137 133* 133*  K 4.9   < > 3.4* 4.9 3.3*  CL 96*   < > 108 93*  88*  CO2 25   < > 20* 23 21*  GLUCOSE 156*   < > 174* 217* 200*  BUN 49*   < > 39* 62* 33*  CREATININE 5.27*   < > 4.60* 7.37* 4.58*  CALCIUM 9.7   < > 6.8* 9.4 7.9*  MG 2.1  --   --  2.2 1.6*  PROT 6.2*  --   --  6.1* 8.3*  ALBUMIN 2.7*   < > 1.7* 2.4* 5.5*  AST 29  --   --  27 25  ALT 34  --   --  29 23  ALKPHOS 92  --   --  106 88  BILITOT 1.1  --   --  1.0 1.3*  GFRNONAA 12*   < > 14* 8* 14*  ANIONGAP 13   < > 9 17* 24*   < > = values in this interval not displayed.    Lipids  Recent Labs  Lab 11/18/2021 0034  CHOL 63  TRIG 90  HDL 16*  LDLCALC 29  CHOLHDL 3.9    Hematology Recent Labs  Lab 11/05/21 0045 11/05/21 1058 11/25/2021 0034  WBC 16.6* 16.6* 15.0*  RBC 3.83* 3.82* 3.44*  HGB 10.9* 10.7* 9.8*  HCT 34.7* 34.0* 31.0*  MCV 90.6 89.0 90.1  MCH 28.5 28.0  28.5  MCHC 31.4 31.5 31.6  RDW 20.9* 20.8* 20.9*  PLT 150 161 152   Thyroid No results for input(s): "TSH", "FREET4" in the last 168 hours.  BNP Recent Labs  Lab 10/24/2021 1553  BNP >4,500.0*    DDimer No results for input(s): "DDIMER" in the last 168 hours.   Radiology    DG CHEST PORT 1 VIEW  Result Date: 11/19/2021 CLINICAL DATA:  56 year old male End stage renal disease. Shortness of breath. Bilateral pleural effusions status post right side thoracentesis 11/02/2021. EXAM: PORTABLE CHEST 1 VIEW COMPARISON:  Portable chest 11/05/2021 and earlier, including recent chest CT 11/01/2021. FINDINGS: Portable AP semi upright view at 0527 hours. Stable lung volumes. Normal cardiac size and mediastinal contours. Visualized tracheal air column is within normal limits. Bilateral veiling pulmonary opacity has not significantly changed from yesterday. Right pleural effusion remains diminished compared to the CT 10/11/2021. Stable pulmonary vascularity, no overt edema. No air bronchograms or pneumothorax. Chronic right 8th rib fracture. No osseous abnormality identified. Paucity of bowel gas in the upper abdomen. IMPRESSION: Stable residual bilateral pleural effusions. No new cardiopulmonary abnormality. Electronically Signed   By: Genevie Ann M.D.   On: 11/29/2021 07:18   PERIPHERAL VASCULAR CATHETERIZATION  Result Date: 11/05/2021 Table formatting from the original result was not included. Images from the original result were not included.   Patient name: Paul Gonzalez          MRN: [id number]        DOB: 12-06-1965          Sex: male  11/05/2021 Pre-operative Diagnosis: Critical limb ischemia of the bilateral lower extremities with diabetic foot ulcers Post-operative diagnosis:  Same Surgeon:  Marty Heck, MD Procedure Performed: 1.  Ultrasound-guided access left common femoral artery 2.  Aortogram with catheter selection of aorta 3.  Bilateral lower extremity arteriogram with selection of third  order branches in the right lower extremity 4.  Right posterior tibial artery angioplasty (2.5 mm x 150 mm Sterling) 5.  Right mid to distal SFA drug-coated balloon angioplasty (5 mm x 150 mm drug-coated impact) 6.  94 minutes of monitored moderate conscious sedation time 7.  Mynx closure of the left common femoral artery  Indications: 56 year old male with end-stage renal disease and diabetes that presents with bilateral lower extremity tissue loss as previously documented.  He is followed by Dr. Sharol Given with orthopedic surgery for his foot ulcers.  He presents today for aortogram, lower extremity arteriogram with initial focus on the right leg.  Risks benefits discussed.  Findings:  Aortogram showed no flow-limiting stenosis in the aortoiliac segment.  The left renal artery was somewhat visualized.  On the right patient had a patent common femoral, profunda, SFA.  The mid to distal SFA had about a 60-70% stenosis that was multifocal.  Popliteal artery is calcified and diseased but no flow-limiting stenosis.  Dominant runoff is in the posterior tibial that has a multifocal high-grade stenosis greater than 80% in the proximal to mid segment that is diseased for a long segment.  The peroneal is diminutive and occludes well above the ankle.  The anterior tibial has a proximal occlusion.  Small vessel disease in the foot.  Ultimately the right posterior tibial was angioplastied with a 2.5 mm Sterling with excellent results.  The mid to distal SFA was then treated with a 5 mm drug-coated balloon.  Inline flow down the right lower extremity through the posterior tibial.  Left lower extremity shows a patent common femoral, profunda, SFA, popliteal and single vessel runoff in the peroneal that gives a collateral off to the PT at the ankle. Small vessel disease in the foot.             Procedure:  The patient was identified in the holding area and taken to room 8.  The patient was then placed supine on the table and prepped  and draped in the usual sterile fashion.  A time out was called.  The patient received Versed and fentanyl for moderate sedation.  I was present for all of sedation and vital signs were monitored including heart rate, respiratory rate, oxygenation and blood pressure.  Ultrasound was used to evaluate the left common femoral artery.  It was patent .  A digital ultrasound image was acquired.  A micropuncture needle was used to access the left common femoral artery under ultrasound guidance.  An 018 wire was advanced without resistance and a micropuncture sheath was placed.  The 018 wire was removed and a benson wire was placed.  The micropuncture sheath was exchanged for a 5 french sheath.  An omniflush catheter was advanced over the wire to the level of L-1.  An abdominal angiogram was obtained.  Next the catheter was pulled down and bilateral lower extremity runoff obtained.  Pertinent findings are noted above.  Initial focus today was on the right leg.  We used a Glidewire advantage to cross the aortic bifurcation with an Omni Flush catheter and ultimately got a wire into the right SFA and exchanged for a long 6 French sheath in the left groin over the aortic bifurcation.  Patient was given 100 units/kg IV heparin.  I then exchanged for a V18 wire with a 018 quick cross and got down to the tibial vessels.  The takeoff of the posterior tibial was very angulated where it was diseased and this was difficult to cannulate and required a lot of maneuvers with different wires and catheters.  Ultimately we finally got a V18 wire down the posterior tibial and the proximal to mid posterior tibial that was heavily diseased with multifocal high-grade stenosis was treated with a 2.5 mm Sterling to nominal  pressure for 2 minutes in each segment.  Additional nitroglycerin was given.  I then elected to treat the mid to distal rihgt SFA with a 5 mm drug-coated balloon Impact for 3 minutes with no dissection and no significant  residual stenosis.  Final imaging showed preserved runoff down the PT that is now in line.  His flow is sluggish with his heart failure.  Wires and catheters were removed.  Short 6 French sheath was placed in the left groin and a mynx closure device was deployed.   Plan: Patient is optimized in the right lower extremity after SFA and posterior tibial intervention with inline flow down the right lower extremity via the posterior tibial.  The left leg has inline flow down the peroneal as his only tibial runoff and is optimized.  Have loaded on Plavix plus aspirin and statin.  If right foot wounds fail to heal would be a candidate for retrograde right AT access.  Marty Heck, MD [facility] Office: [phone]    DG CHEST PORT 1 VIEW  Result Date: 11/05/2021 CLINICAL DATA:  [id number] with shortness of breath, coughing. EXAM: PORTABLE CHEST 1 VIEW COMPARISON:  Portable chest 11/02/2021 FINDINGS: 5:17 a.m. Bilateral moderate layering pleural effusions are both increased over the prior study. There is overlying hazy atelectasis or airspace disease in the hypoinflated lower zones. The upper lung zones are generally clear. There is mild cardiomegaly, mild central vascular fullness without overt edema. The mediastinum is unchanged. There is calcification of the transverse aorta. Thoracic spondylosis. No pneumothorax. IMPRESSION: 1. Increased bilateral moderate layering pleural effusions with overlying hazy atelectasis or airspace disease. 2. Cardiomegaly with mild central vascular fullness. No overt edema. 3. Aortic atherosclerosis. Electronically Signed   By: Telford Nab M.D.   On: 11/05/2021 07:23    Cardiac Studies   Echocardiogram 11/01/2021: Impressions:  1. Left ventricular ejection fraction, by estimation, is 25 to 30%. The  left ventricle has severely decreased function. The left ventricle  demonstrates global hypokinesis. Left ventricular diastolic parameters are   consistent with Grade III diastolic  dysfunction (restrictive).   2. Right ventricular systolic function is mildly reduced. The right  ventricular size is severely enlarged. There is moderately elevated  pulmonary artery systolic pressure. The estimated right ventricular  systolic pressure is 47.6 mmHg.   3. Left atrial size was severely dilated.   4. Right atrial size was mildly dilated.   5. A small pericardial effusion is present. The pericardial effusion is  circumferential.   6. The mitral valve is abnormal. Mild mitral valve regurgitation. Mild  mitral stenosis. Moderate mitral annular calcification.   7. The aortic valve is calcified. Aortic valve regurgitation is moderate  to severe. Moderate to severe aortic valve stenosis. GRadient of 36 mm Hg  at max despite decreased strok volume index. DVI 0.20.   Comparison(s): Significant changes from prior. Atempting to reach primary  team.    Patient Profile     56 y.o. male with a history of moderate aortic stenosis noted on Echo in 03/2021, hypertension, hyperlipidemia, type 2 diabetes mellitus, and ESRD on hemodialysis who was admitted to [facility] on 10/08/2021 for sepsis secondary to gangrenous wound of right great toe with presumed superimposed cellulitis. Blood cultures were positive for Bacteroides fragilis. Echo at [facility] showed newly reduced EF of 25-30% for which Cardiology was consulted. He was transferred to [facility] for possible amputation of gangrenous foot.  Assessment & Plan    Acute  Combined CHF Bilateral Pleural Effusion Patient admitted with sepsis as above but was also noted to have acute CHF with newly reduced EF. BNP > 4,500 and chest x-ray showed bilateral pleural effusion (right > left).  Echo showed LVEF of 25-30% (down from 60-65% on Echo in 03/2021) with global hypokinesis and grade III diastolic dysfunction, severely enlarged RV with mildly reduced systolic function, severe left atrial enlargement,  moderate to severe aortic stenosis/ aortic insufficiency with mean gradient of 36.0 mmHg, mild mitral stenosis, and small pericardial effusion.  Patient underwent thoracentsis of the right pleural effusion on 10/30 with removal of 1.5 L of fluid. Otherwise, volume status has been managed with hemodialysis. - Not on any GDMT due to hypotension requiring Midodrine and ESRD.  - Unclear whether cardiomyopathy is secondary to progressive aortic valve disease. Per Dr. Domenic Polite, would not pursue invasive cardiac work-up at this time given active bacteremia.  Aortic Valve Disease Noted to have moderate aortic stenosis on Echo in 03/2021. However, repeat Echo this admission showed progression to moderate to severe aortic stenosis as well as moderate to severe aortic insufficiency. Mean gradient 36.0 mmHg. Was supposted to have TEE today.  Cancelled by anesthesia because BP low    Rescheduled to Monday   Risks/ benefits previously described   Pt agrees to proceed. .   Hypotension History of hypotension requiring Midodrine.  - Midodrine has been increased to '10mg'$  three times daily this admission.  Hyperlipidemia LDL 30 in 06/2021. - Continue Lipitor '20mg'$  daily.  Type 2 Diabetes Mellitus Hemoglobin A1c 6.4% this admission. - Management per primary team.  ESRD  On hemodialysis M/W/F.  - Nephrology following.  Sepsis Secondary to Gangrenous Right Toe with Cellulitis Bacteremia  On Zosyn  PAD S/p angiogram with R Post tibila PTA and L SFA PTA    For questions or updates, please contact [place] Please consult [email or web address] for contact info under      Signed, Dorris Carnes, MD  11/15/2021, 2:36 PM

## 2021-11-06 NOTE — Progress Notes (Signed)
Procedure cancelled by anesthesia due to low blood pressures. Floor RN made aware.

## 2021-11-06 NOTE — Discharge Instructions (Signed)
° °  [facility] ° °Discharge Instructions ° °Lower Extremity Angiogram; Angioplasty/Stenting ° °Please refer to the following instructions for your post-procedure care. Your surgeon or physician assistant will discuss any changes with you. ° °Activity ° °Avoid lifting more than 8 pounds (1 gallons of milk) for 72 hours (3 days) after your procedure. You may walk as much as you can tolerate. It's OK to drive after 72 hours. ° °Bathing/Showering ° °You may shower the day after your procedure. If you have a bandage, you may remove it at 24- 48 hours. Clean your incision site with mild soap and water. Pat the area dry with a clean towel. ° °Diet ° °Resume your pre-procedure diet. There are no special food restrictions following this procedure. All patients with peripheral vascular disease should follow a low fat/low cholesterol diet. In order to heal from your surgery, it is CRITICAL to get adequate nutrition. Your body requires vitamins, minerals, and protein. Vegetables are the best source of vitamins and minerals. Vegetables also provide the perfect balance of protein. Processed food has little nutritional value, so try to avoid this. ° °Medications ° °Resume taking all of your medications unless your doctor tells you not to. If your incision is causing pain, you may take over-the-counter pain relievers such as acetaminophen (Tylenol) ° °Follow Up ° °Follow up will be arranged at the time of your procedure. You may have an office visit scheduled or may be scheduled for surgery. Ask your surgeon if you have any questions. ° °Please call us immediately for any of the following conditions: °•Severe or worsening pain your legs or feet at rest or with walking. °•Increased pain, redness, drainage at your groin puncture site. °•Fever of 101 degrees or higher. °•If you have any mild or slow bleeding from your puncture site: lie down, apply firm constant pressure over the area with a piece of  gauze or a clean wash cloth for 30 minutes- no peeking!, call 911 right away if you are still bleeding after 30 minutes, or if the bleeding is heavy and unmanageable. ° °Reduce your risk factors of vascular disease: ° °Stop smoking. If you would like help call [facility] at 1-800-QUIT-NOW (1-800-784-8669) or [place] at [phone]. °Manage your cholesterol °Maintain a desired weight °Control your diabetes °Keep your blood pressure down ° °If you have any questions, please call the office at [phone] ° °

## 2021-11-06 NOTE — Progress Notes (Signed)
PROGRESS NOTE    Paul Gonzalez  ZOX:[id number] DOB: 02/17/1965 DOA: 10/30/2021 PCP: Coral Spikes, DO   Brief Narrative:  56 y.o. male with medical history significant of Hypertension, hyperlipidemia, ESRD on HD (MWF), anemia of chronic disease, diabetic retinopathy with vision loss who presents to the emergency department due to worsening shortness of breath that has been ongoing for about 2 weeks, this was associated with nonproductive cough.  He  also complained of bilateral diabetic foot ulcers and states that he has not been able to get an appointment with a podiatrist.  He denies chest pain, fever, chills, nausea, vomiting, diarrhea or constipation.   ED Course:  In the emergency department, he was intermittently tachypneic, BP was soft at 105/52, other vital signs were within normal range.  Work-up in the ED showed leukocytosis, normocytic anemia, BMP showed sodium 135, potassium 4.9, chloride 93, bicarb 25, glucose 173, BUN 45, creatinine 5.14, albumin 3.3, AST 57, ALT 47, total bilirubin 1.6.  Anion gap 17, lactic acid 2.4 > 2.8, BNP > 4,500.  Influenza A, B, SARS coronavirus 2 was negative.   Right foot x-ray showed no fracture or dislocation of the right foot.  No radiographic findings of osteomyelitis.  Soft tissue wound over the medial great toe.  Soft tissue edema of the forefoot.  Chest x-ray showed cardiomegaly without failure.  Bilateral pleural effusion and associated airspace disease, likely atelectasis, right greater than left.  He was treated with IV ceftriaxone, vancomycin and azithromycin.  Midodrine was given.  IV hydration was provided.  Hospitalist was asked to admit patient for further evaluation and management.  Orthopedic surgeon (Dr. Erlinda Hong) was consulted and patient will be seen when he arrives at [facility].    **Interim History Orthopedic surgery evaluated and recommended ABIs and also recommended vascular evaluation.  Vascular surgery is now evaluating and plan is for  aortogram with lower extremity arteriogram.  Nephrology is also been consulted and plan is for dialysis later today.  11/22/2021.  Patient was supposed to go for a TEE today given his bacteremia but this was canceled due to his hypotension.  Vascular surgery feels that he is optimized from the left lower extremity standpoint as well as he has inline flow with only single-vessel peroneal runoff.  They are recommending discussing aspirin and Plavix as well as statin daily and recommending following up in a month.  No allergies taking the patient back to dialysis again today and he remains on IV Zosyn.  May need to broaden his antibiotics given that his WBC remains elevated but slowly trending down.  Assessment and Plan: Sepsis secondary to Gangrene Right Toe with Cellulitis -Patient will likely require amputation of gangrenous tissue -Continue antibiotics for B fragilis coverage with IV zosyn -Plan is for Aortogram with lower extremity arteriogram with possible intervention tomorrow -[name]'s ordered and showed and reveals [place] vessels on the right with biphasic waveforms and dampened monophasic waveforms on the left.  -Patient undergoing an aortogram with lower extremity arteriogram today given that there is tissue loss and he had a right posterior tibial artery angioplasty and right mid to distal SFV drug-coated balloon angioplasty and has been optimized in the right lower extremity after SFA and posterior tibial intervention with inline flow down the right lower extremity with via the posterior tibial; patient was loaded with Plavix and aspirin and statin and they feel that if the right foot wounds fail to heal he would be a candidate for retrograde right AT access -WBC has  gone from 13.0 -> 16.6 x2 -> 15.0 -Orthopedic Surgery consulted and will follow up after Vascular Surgery evaluation -Vascular feels that he has been optimized from their standpoint   New finding of Cardiomyopathy  Acute Systolic  CHF/HFrEF Volume overload -Fluid management with hemodialysis  -Midodrine dose increased due to soft BPs limiting volume removal for HD -Echo with findings of change in EF now down to 20-25% with severely decreased function of the left ventricle demonstrates global hypokinesis and left ventricular diastolic parameters consistent with grade 3 diastolic dysfunction and restricted as well as the right ventricular systolic function is mildly reduced; patient's aortic valve regurgitation is moderate to severe and moderate to severe aortic valve stenosis was noted with a gradient of 36 mmHg metrics despite decrease stroke-volume index -BNP was >4,500 -Inpatient cardiology consultation requested -Pt may require TEE to better assess for valvular disease and cardiology recommending and planning for this later this week -Appreciate cardiology team recs; they will follow him at [facility]  -Volume maintenance is via dialysis and he underwent thoracentesis of the right pleural effusion on 11/02/2021 with removal 1.5 L of fluid. -He is not on any GDMT due to hypotension requiring midodrine and ESRD -Cardiology is unclear whether cardiomyopathy secondary to progressive aortic valve disease but they would not pursue any invasive work-up at this time given his acute bacteremia; TEE canceled. -Getting volume maintenance by dialysis if tolerated   B Fragilis Bacteremia -My colleague discussed with Pharmacist and the patient waschanged to IV Zosyn for better coverage -Patient remains on antibiotic coverage with IV Zosyn and WBC is trending upwards in 1-16.6 is again stable at 16.6 -Cardiology recommending TEE given his bacteremia and has echo that was different from the one done in March and there unable to perform it at the same time as a peripheral arteriogram so they are planning for this on Friday, 11/29/2021 however this had to be canceled due to his hypotension -May need to consult with infectious diseases   ESRD on  HD Elevated anion gap metabolic Acidosis -He is on schedule with MWF HD treatments -Nephrology was consulted regarding inpatient HD treatments -Electrolytes are stable at this time and Na+ improved from 132 -> 137 but K+ is slightly low and is 3.3 again -Patient has a slight metabolic acidosis with a CO2 of 21, anion gap of 24, chloride level of 88 -Patient's BUN/Cr went from 63/6.21 -> 40/5.04 -> 39/4.60 -> 62/7.37 and is now 33/4.58 -Nephrology team has been consulted and plan is for Dialysis and unfortunate was unable to get it yesterday and will go later today after the arteriogram   Hypokalemia Hypomagnesemia -As above -Patient's K+ is now improved to 4.9 yesterday but then dropped again to 3.3 and mag level is 1.6 -Continue to Monitor and Trend and likely to be repleted in dialysis -Repeat CMP in the AM    Lactic Acidosis -Last Lactate down to 2.1 now   Bilateral Pleural Effusions R>L -Thoracentesis ordered for large right effusion, completed 10/30 with 1.5L removed -Continue supportive care and volume management with HD for now -Not currently with any symptoms of respiratory distress -Repeat CXR done this AM and showed "Increased bilateral moderate layering pleural effusions with overlying hazy atelectasis or airspace disease. Cardiomegaly with mild central vascular fullness. No overt edema. Aortic atherosclerosis." -Repeat chest x-ray today showed "Stable residual bilateral pleural effusions. No new cardiopulmonary abnormality." -Manage as per dialysis and may need a repeat thoracentesis   Leukocytosis  -Continue to monitor CBC/diff as response  to treating infection -Follow-up blood cultures -Patient's WBC went from 15.8 -> 12.9 -> 13.0 and trended up to 16.6 today x2 -Continue to Monitor and Trend and repeat CBC in the AM    Diabetes mellitus with neurological, renal, vascular complications -Continue very sensitive NovoLog sliding scale before meals and at bedtime SSI  coverage, daily lantus 8 units for basal coverage, frequent CBG monitoring -Continue monitor CBGs per protocol and CBGs ranging from 150-171   Chronic Hypotension  -Had resumed Midodrine, dose increased by nephrology due to soft BPs limiting HD volume removal and also prevented him from getting a TEE -Continue to monitor blood pressures per protocol -Last blood pressure reading was 106/43 but it got as low as 74/50 with the patient being asymptomatic   Normocytic Anemia -Patient's Hgb/Hct went from 11.2/35.2 -> 8.6/27.1 and is now trended back up to 10.9/34.7 is now 10.7/34.0 today and today is now 9.8/31.0 -Check Anemia Panel in the AM  -Continue to Monitor for S/Sx of Bleeding; No overt bleeding noted -Repeat CBC in the AM    Thrombocytopenia -Patient's Plal[place] Count went from Platelet went from 126 -> 119 and trended up to 150 and is now 161 yesterday and today is now 152 -Continue to Monitor for S/Sx of Bleeding; No overt bleeding noted -Repeat CBC in the AM    Hypoalbuminemia -Patient's Albumin Level went from 2.7 -> 2.6 -> 2.6 -> 1.7 and is now 2.4 yesterday and today it is 5.5 and question accuracy -Continue to Monitor and Trend -Repeat CMP in the AM    Hyponatremia -Patient's sodium is now 133 again -Likely to be repleted in dialysis and corrected there -Continue monitor and trend and repeat CMP in a.m. -Repeat CMP in the AM   DVT prophylaxis: SCDs Start: 10/25/2021 2114    Code Status: Full Code Family Communication: No family currently at bedside  Disposition Plan:  Level of care: Telemetry Medical Status is: Inpatient Remains inpatient appropriate because: Needs further clinical improvement and clearance by specialist   Consultants:  Vascular surgery Nephrology Cardiology Orthopedic surgery  Procedures:  As delineated as above  ECHOCARDIOGRAM IMPRESSIONS     1. Left ventricular ejection fraction, by estimation, is 25 to 30%. The  left ventricle has  severely decreased function. The left ventricle  demonstrates global hypokinesis. Left ventricular diastolic parameters are  consistent with Grade III diastolic  dysfunction (restrictive).   2. Right ventricular systolic function is mildly reduced. The right  ventricular size is severely enlarged. There is moderately elevated  pulmonary artery systolic pressure. The estimated right ventricular  systolic pressure is 54.2 mmHg.   3. Left atrial size was severely dilated.   4. Right atrial size was mildly dilated.   5. A small pericardial effusion is present. The pericardial effusion is  circumferential.   6. The mitral valve is abnormal. Mild mitral valve regurgitation. Mild  mitral stenosis. Moderate mitral annular calcification.   7. The aortic valve is calcified. Aortic valve regurgitation is moderate  to severe. Moderate to severe aortic valve stenosis. GRadient of 36 mm Hg  at max despite decreased strok volume index. DVI 0.20.   Comparison(s): Significant changes from prior. Atempting to reach primary  team.   FINDINGS   Left Ventricle: Left ventricular ejection fraction, by estimation, is 25  to 30%. The left ventricle has severely decreased function. The left  ventricle demonstrates global hypokinesis. The left ventricular internal  cavity size was normal in size. There  is no left ventricular hypertrophy.  Left ventricular diastolic parameters  are consistent with Grade III diastolic dysfunction (restrictive).   Right Ventricle: The right ventricular size is severely enlarged. No  increase in right ventricular wall thickness. Right ventricular systolic  function is mildly reduced. There is moderately elevated pulmonary artery  systolic pressure. The tricuspid  regurgitant velocity is 2.82 m/s, and with an assumed right atrial  pressure of 15 mmHg, the estimated right ventricular systolic pressure is  69.6 mmHg.   Left Atrium: Left atrial size was severely dilated.   Right  Atrium: Right atrial size was mildly dilated.   Pericardium: A small pericardial effusion is present. The pericardial  effusion is circumferential. Presence of epicardial fat layer.   Mitral Valve: The mitral valve is abnormal. Moderate mitral annular  calcification. Mild mitral valve regurgitation. Mild mitral valve  stenosis.   Tricuspid Valve: The tricuspid valve is normal in structure. Tricuspid  valve regurgitation is mild . No evidence of tricuspid stenosis.   Aortic Valve: The aortic valve is calcified. Aortic valve regurgitation is  moderate to severe. Aortic regurgitation PHT measures 160 msec. Moderate  to severe aortic stenosis is present. Aortic valve mean gradient measures  36.0 mmHg. Aortic valve peak  gradient measures 59.9 mmHg. Aortic valve area, by VTI measures 0.54 cm.   Pulmonic Valve: The pulmonic valve was grossly normal. Pulmonic valve  regurgitation is trivial. No evidence of pulmonic stenosis.   Aorta: The aortic root is normal in size and structure.   IAS/Shunts: No atrial level shunt detected by color flow Doppler.     LEFT VENTRICLE  PLAX 2D  LVIDd:         5.50 cm  LVIDs:         4.70 cm  LV PW:         1.10 cm  LV IVS:        1.00 cm  LVOT diam:     1.90 cm  LV SV:         46  LV SV Index:   24  LVOT Area:     2.84 cm    LV Volumes (MOD)  LV vol d, MOD A2C: 177.0 ml  LV vol d, MOD A4C: 211.0 ml  LV vol s, MOD A2C: 116.0 ml  LV vol s, MOD A4C: 133.0 ml  LV SV MOD A2C:     61.0 ml  LV SV MOD A4C:     211.0 ml  LV SV MOD BP:      73.3 ml   RIGHT VENTRICLE  RV S prime:     7.62 cm/s  TAPSE (M-mode): 1.5 cm   LEFT ATRIUM              Index        RIGHT ATRIUM           Index  LA diam:        5.30 cm  2.75 cm/m   RA Area:     21.40 cm  LA Vol (A2C):   148.0 ml 76.76 ml/m  RA Volume:   71.10 ml  36.88 ml/m  LA Vol (A4C):   143.0 ml 74.17 ml/m  LA Biplane Vol: 148.0 ml 76.76 ml/m   AORTIC VALVE  AV Area (Vmax):    0.55 cm  AV  Area (Vmean):   0.64 cm  AV Area (VTI):     0.54 cm  AV Vmax:           387.00 cm/s  AV Vmean:          232.500 cm/s  AV VTI:            0.863 m  AV Peak Grad:      59.9 mmHg  AV Mean Grad:      36.0 mmHg  LVOT Vmax:         75.70 cm/s  LVOT Vmean:        52.500 cm/s  LVOT VTI:          0.164 m  LVOT/AV VTI ratio: 0.19  AI PHT:            160 msec    AORTA  Ao Root diam: 3.10 cm   MITRAL VALVE                TRICUSPID VALVE  MV Area (PHT): 6.17 cm     TR Peak grad:   31.8 mmHg  MV Decel Time: 123 msec     TR Vmax:        282.00 cm/s  MR Peak grad: 81.0 mmHg  MR Mean grad: 49.0 mmHg     SHUNTS  MR Vmax:      450.00 cm/s   Systemic VTI:  0.16 m  MR Vmean:     322.0 cm/s    Systemic Diam: 1.90 cm  MV E velocity: 114.00 cm/s  MV A velocity: 34.70 cm/s  MV E/A ratio:  3.29    Procedure Performed: 1.  Ultrasound-guided access left common femoral artery 2.  Aortogram with catheter selection of aorta 3.  Bilateral lower extremity arteriogram with selection of third order branches in the right lower extremity 4.  Right posterior tibial artery angioplasty (2.5 mm x 150 mm [place]) 5.  Right mid to distal SFA drug-coated balloon angioplasty (5 mm x 150 mm drug-coated impact) 6.  94 minutes of monitored moderate conscious sedation time 7.  Mynx closure of the left common femoral artery  Antimicrobials:  Anti-infectives (From admission, onward)    Start     Dose/Rate Route Frequency Ordered Stop   11/04/21 2300  piperacillin-tazobactam (ZOSYN) IVPB 2.25 g  Status:  Discontinued        2.25 g 100 mL/hr over 30 Minutes Intravenous Every 8 hours 11/03/21 2305 11/04/21 0004   11/04/21 0015  piperacillin-tazobactam (ZOSYN) IVPB 2.25 g        2.25 g 100 mL/hr over 30 Minutes Intravenous Every 8 hours 11/04/21 0005     11/02/21 1800  ceFEPIme (MAXIPIME) 2 g in sodium chloride 0.9 % 100 mL IVPB  Status:  Discontinued        2 g 200 mL/hr over 30 Minutes Intravenous Every M-W-F (1800)  11/02/2021 2119 11/02/21 0947   11/02/21 1200  vancomycin (VANCOREADY) IVPB 750 mg/150 mL  Status:  Discontinued        750 mg 150 mL/hr over 60 Minutes Intravenous Every M-W-F (Hemodialysis) 10/30/2021 2119 11/02/21 0948   11/02/21 1200  piperacillin-tazobactam (ZOSYN) IVPB 2.25 g  Status:  Discontinued        2.25 g 100 mL/hr over 30 Minutes Intravenous Every 8 hours 11/02/21 0952 11/02/21 1014   11/02/21 1200  piperacillin-tazobactam (ZOSYN) 2.25 g in sodium chloride 0.9 % 50 mL IVPB  Status:  Discontinued        2.25 g 100 mL/hr over 30 Minutes Intravenous Every 8 hours 11/02/21 1014 11/03/21 2305   10/08/2021 2130  vancomycin (VANCOREADY) IVPB 500 mg/100 mL  500 mg 100 mL/hr over 60 Minutes Intravenous  Once 10/24/2021 2119 11/01/21 0152   10/12/2021 2100  vancomycin (VANCOREADY) IVPB 1500 mg/300 mL  Status:  Discontinued        1,500 mg 150 mL/hr over 120 Minutes Intravenous  Once 10/15/2021 2057 10/18/2021 2119   10/17/2021 2100  ceFEPIme (MAXIPIME) 2 g in sodium chloride 0.9 % 100 mL IVPB        2 g 200 mL/hr over 30 Minutes Intravenous  Once 10/27/2021 2057 11/01/21 0152   10/17/2021 1630  vancomycin (VANCOCIN) IVPB 1000 mg/200 mL premix        1,000 mg 200 mL/hr over 60 Minutes Intravenous  Once 10/29/2021 1628 10/19/2021 1818   10/17/2021 1530  cefTRIAXone (ROCEPHIN) 2 g in sodium chloride 0.9 % 100 mL IVPB  Status:  Discontinued        2 g 200 mL/hr over 30 Minutes Intravenous Every 24 hours 10/27/2021 1522 10/27/2021 2119   10/05/2021 1530  azithromycin (ZITHROMAX) 500 mg in sodium chloride 0.9 % 250 mL IVPB  Status:  Discontinued        500 mg 250 mL/hr over 60 Minutes Intravenous Every 24 hours 10/30/2021 1522 11/02/21 0947       Subjective: Seen and examined at bedside and he is feeling okay but his blood pressure was on the lower side.  He denies any pain.  Unable to do TEE given his low blood pressure.  Asking about eating given that he is just come back from his attempted procedure.  No other  concerns or complaints at this time.  Objective: Vitals:   11/17/2021 1730 11/04/2021 1800 11/12/2021 1813 11/19/2021 1834  BP: (!) 109/95 (!) 91/46 (!) 89/45 (!) 106/43  Pulse: 89  88 88  Resp:    15  Temp:    98.9 F (37.2 C)  TempSrc:      SpO2:    97%  Weight:    82.2 kg  Height:        Intake/Output Summary (Last 24 hours) at 11/29/2021 1845 Last data filed at 12/03/2021 1834 Gross per 24 hour  Intake 640 ml  Output 1700 ml  Net -1060 ml   Filed Weights   11/12/2021 0436 11/23/2021 1515 12/03/2021 1834  Weight: 81.9 kg 97.8 kg 82.2 kg   Examination: Physical Exam:  Constitutional: WN/WD overweight chronically ill-appearing Caucasian male currently no acute distress Respiratory: Diminished to auscultation bilaterally with coarse breath sounds and some crackles, no wheezing, rales, rhonchi. Normal respiratory effort and patient is not tachypenic. No accessory muscle use.  Unlabored breathing Cardiovascular: RRR, no murmurs / rubs / gallops. S1 and S2 auscultated.  Has some lower extremity edema Abdomen: Soft, non-tender, non-distended.Bowel sounds positive.  GU: Deferred. Skin: Tissue changes noted on his lower extremity Neurologic: CN 2-12 grossly intact with no focal deficits. Romberg sign and cerebellar reflexes not assessed.  Psychiatric: Normal judgment and insight. Alert and oriented x 3. Normal mood and appropriate affect.   Data Reviewed: I have personally reviewed following labs and imaging studies  CBC: Recent Labs  Lab 11/03/21 0306 11/04/21 0053 11/05/21 0045 11/05/21 1058 11/05/2021 0034  WBC 12.9* 13.0* 16.6* 16.6* 15.0*  NEUTROABS 11.0* 11.5* 14.6* 14.7* 13.7*  HGB 11.2* 8.6* 10.9* 10.7* 9.8*  HCT 35.2* 27.1* 34.7* 34.0* 31.0*  MCV 90.0 90.9 90.6 89.0 90.1  PLT 126* 119* 150 161 767   Basic Metabolic Panel: Recent Labs  Lab 11/01/21 0525 11/02/21 0604 11/03/21 0306 11/04/21 0053 11/05/21 0045  12/03/2021 0034  NA 134* 134* 132* 137 133* 133*  K 4.9 5.0  3.9 3.4* 4.9 3.3*  CL 96* 96* 95* 108 93* 88*  CO2 '25 25 27 '$ 20* 23 21*  GLUCOSE 156* 174* 150* 174* 217* 200*  BUN 49* 63* 40* 39* 62* 33*  CREATININE 5.27* 6.21* 5.04* 4.60* 7.37* 4.58*  CALCIUM 9.7 9.4 8.8* 6.8* 9.4 7.9*  MG 2.1  --   --   --  2.2 1.6*  PHOS 7.0* 7.5* 5.5* 4.2 6.5* 4.5   GFR: Estimated Creatinine Clearance: 18.2 mL/min (A) (by C-G formula based on SCr of 4.58 mg/dL (H)). Liver Function Tests: Recent Labs  Lab 11/02/2021 1553 11/01/21 0525 11/02/21 0604 11/03/21 0306 11/04/21 0053 11/05/21 0045 11/05/2021 0034  AST 57* 29  --   --   --  27 25  ALT 47* 34  --   --   --  29 23  ALKPHOS 112 92  --   --   --  106 88  BILITOT 1.6* 1.1  --   --   --  1.0 1.3*  PROT 7.4 6.2*  --   --   --  6.1* 8.3*  ALBUMIN 3.3* 2.7* 2.6* 2.6* 1.7* 2.4* 5.5*   No results for input(s): "LIPASE", "AMYLASE" in the last 168 hours. No results for input(s): "AMMONIA" in the last 168 hours. Coagulation Profile: Recent Labs  Lab 10/11/2021 1553  INR 1.6*   Cardiac Enzymes: No results for input(s): "CKTOTAL", "CKMB", "CKMBINDEX", "TROPONINI" in the last 168 hours. BNP (last 3 results) No results for input(s): "PROBNP" in the last 8760 hours. HbA1C: No results for input(s): "HGBA1C" in the last 72 hours. CBG: Recent Labs  Lab 11/05/21 0609 11/05/21 1125 11/05/21 2026 11/21/2021 0613 11/07/2021 1042  GLUCAP 154* 171* 166* 150* 170*   Lipid Profile: Recent Labs    12/02/2021 0034  CHOL 63  HDL 16*  LDLCALC 29  TRIG 90  CHOLHDL 3.9   Thyroid Function Tests: No results for input(s): "TSH", "T4TOTAL", "FREET4", "T3FREE", "THYROIDAB" in the last 72 hours. Anemia Panel: No results for input(s): "VITAMINB12", "FOLATE", "FERRITIN", "TIBC", "IRON", "RETICCTPCT" in the last 72 hours. Sepsis Labs: Recent Labs  Lab 10/18/2021 1553 11/01/2021 1801 10/30/2021 2101  LATICACIDVEN 2.4* 2.8* 2.1*    Recent Results (from the past 240 hour(s))  Resp Panel by RT-PCR (Flu A&B, Covid) Anterior  Nasal Swab     Status: None   Collection Time: 10/10/2021  3:22 PM   Specimen: Anterior Nasal Swab  Result Value Ref Range Status   SARS Coronavirus 2 by RT PCR NEGATIVE NEGATIVE Final    Comment: (NOTE) SARS-CoV-2 target nucleic acids are NOT DETECTED.  The SARS-CoV-2 RNA is generally detectable in upper respiratory specimens during the acute phase of infection. The lowest concentration of SARS-CoV-2 viral copies this assay can detect is 138 copies/mL. A negative result does not preclude SARS-Cov-2 infection and should not be used as the sole basis for treatment or other patient management decisions. A negative result may occur with  improper specimen collection/handling, submission of specimen other than nasopharyngeal swab, presence of viral mutation(s) within the areas targeted by this assay, and inadequate number of viral copies(<138 copies/mL). A negative result must be combined with clinical observations, patient history, and epidemiological information. The expected result is Negative.  Fact Sheet for Patients:  EntrepreneurPulse.com.au  Fact Sheet for Healthcare Providers:  IncredibleEmployment.be  This test is no t yet approved or cleared by the [place] FDA  and  has been authorized for detection and/or diagnosis of SARS-CoV-2 by FDA under an Emergency Use Authorization (EUA). This EUA will remain  in effect (meaning this test can be used) for the duration of the COVID-19 declaration under Section 564(b)(1) of the Act, 21 U.S.C.section 360bbb-3(b)(1), unless the authorization is terminated  or revoked sooner.       Influenza A by PCR NEGATIVE NEGATIVE Final   Influenza B by PCR NEGATIVE NEGATIVE Final    Comment: (NOTE) The Xpert Xpress SARS-CoV-2/FLU/RSV plus assay is intended as an aid in the diagnosis of influenza from Nasopharyngeal swab specimens and should not be used as a sole basis for treatment. Nasal washings  and aspirates are unacceptable for Xpert Xpress SARS-CoV-2/FLU/RSV testing.  Fact Sheet for Patients: EntrepreneurPulse.com.au  Fact Sheet for Healthcare Providers: IncredibleEmployment.be  This test is not yet approved or cleared by the [place] FDA and has been authorized for detection and/or diagnosis of SARS-CoV-2 by FDA under an Emergency Use Authorization (EUA). This EUA will remain in effect (meaning this test can be used) for the duration of the COVID-19 declaration under Section 564(b)(1) of the Act, 21 U.S.C. section 360bbb-3(b)(1), unless the authorization is terminated or revoked.  Performed at [facility], [address] [place], [place] [address]   Blood Culture (routine x 2)     Status: None   Collection Time: 10/26/2021  3:53 PM   Specimen: BLOOD RIGHT ARM  Result Value Ref Range Status   Specimen Description BLOOD RIGHT ARM  Final   Special Requests   Final    BOTTLES DRAWN AEROBIC AND ANAEROBIC Blood Culture adequate volume   Culture   Final    NO GROWTH 5 DAYS Performed at [facility], [address] [place], [place] [address]    Report Status 11/05/2021 FINAL  Final  Blood Culture (routine x 2)     Status: Abnormal   Collection Time: 10/25/2021  3:54 PM   Specimen: Right Antecubital; Blood  Result Value Ref Range Status   Specimen Description   Final    RIGHT ANTECUBITAL Performed at [facility], [address], [place], [place] [address]    Special Requests   Final    BOTTLES DRAWN AEROBIC AND ANAEROBIC Blood Culture results may not be optimal due to an excessive volume of blood received in culture bottles Performed at [facility], [address], [place], [place] [address]    Culture  Setup Time   Final    GRAM NEGATIVE RODS Gram Stain Report Called to,Read Back By and Verified With:  J. LONG @ 1512 BY STEPHTR 11/01/21 CRITICAL RESULT CALLED TO, READ BACK BY AND VERIFIED WITH: E PREYER,RN'@0125'$  0/30/23 [place]     Culture (A)  Final    BACTEROIDES FRAGILIS BETA LACTAMASE POSITIVE Performed at [facility], [place] [address], [place], [place] [address]    Report Status 11/03/2021 FINAL  Final  Blood Culture ID Panel (Reflexed)     Status: Abnormal   Collection Time: 10/09/2021  3:54 PM  Result Value Ref Range Status   Enterococcus faecalis NOT DETECTED NOT DETECTED Final   Enterococcus Faecium NOT DETECTED NOT DETECTED Final   Listeria monocytogenes NOT DETECTED NOT DETECTED Final   Staphylococcus species NOT DETECTED NOT DETECTED Final   Staphylococcus aureus (BCID) NOT DETECTED NOT DETECTED Final   Staphylococcus epidermidis NOT DETECTED NOT DETECTED Final   Staphylococcus lugdunensis NOT DETECTED NOT DETECTED Final   Streptococcus species NOT DETECTED NOT DETECTED Final  Streptococcus agalactiae NOT DETECTED NOT DETECTED Final   Streptococcus pneumoniae NOT DETECTED NOT DETECTED Final   Streptococcus pyogenes NOT DETECTED NOT DETECTED Final   A.calcoaceticus-baumannii NOT DETECTED NOT DETECTED Final   Bacteroides fragilis DETECTED (A) NOT DETECTED Final    Comment: CRITICAL RESULT CALLED TO, READ BACK BY AND VERIFIED WITH: E PREYER,RN'@0125'$  11/02/21 [place]    Enterobacterales NOT DETECTED NOT DETECTED Final   Enterobacter cloacae complex NOT DETECTED NOT DETECTED Final   Escherichia coli NOT DETECTED NOT DETECTED Final   Klebsiella aerogenes NOT DETECTED NOT DETECTED Final   Klebsiella oxytoca NOT DETECTED NOT DETECTED Final   Klebsiella pneumoniae NOT DETECTED NOT DETECTED Final   Proteus species NOT DETECTED NOT DETECTED Final   Salmonella species NOT DETECTED NOT DETECTED Final   Serratia marcescens NOT DETECTED NOT DETECTED Final   Haemophilus influenzae NOT DETECTED NOT DETECTED Final   Neisseria meningitidis NOT DETECTED NOT DETECTED Final   Pseudomonas aeruginosa NOT DETECTED NOT DETECTED Final   Stenotrophomonas maltophilia NOT DETECTED NOT DETECTED Final   Candida albicans NOT  DETECTED NOT DETECTED Final   Candida auris NOT DETECTED NOT DETECTED Final   Candida glabrata NOT DETECTED NOT DETECTED Final   Candida krusei NOT DETECTED NOT DETECTED Final   Candida parapsilosis NOT DETECTED NOT DETECTED Final   Candida tropicalis NOT DETECTED NOT DETECTED Final   Cryptococcus neoformans/gattii NOT DETECTED NOT DETECTED Final    Comment: Performed at [facility], [place] [address] [place], [place] [address]  Culture, body fluid w Gram Stain-bottle     Status: None (Preliminary result)   Collection Time: 11/02/21  8:54 AM   Specimen: Pleura  Result Value Ref Range Status   Specimen Description PLEURAL BOTTLES DRAWN AEROBIC AND ANAEROBIC  Final   Special Requests Blood Culture adequate volume  Final   Culture   Final    NO GROWTH 4 DAYS Performed at [facility], [address], [place], [place] [address]    Report Status PENDING  Incomplete  Gram stain     Status: None   Collection Time: 11/02/21  8:54 AM   Specimen: Pleura  Result Value Ref Range Status   Specimen Description PLEURAL  Final   Special Requests NONE  Final   Gram Stain   Final    NO ORGANISMS SEEN WBC PRESENT, PREDOMINANTLY PMN CYTOSPIN SMEAR Performed at [facility], [address], [place],  [address]    Report Status 11/02/2021 FINAL  Final    Radiology Studies: DG CHEST PORT 1 VIEW  Result Date: 11/21/2021 CLINICAL DATA:  56 year old male End stage renal disease. Shortness of breath. Bilateral pleural effusions status post right side thoracentesis 11/02/2021. EXAM: PORTABLE CHEST 1 VIEW COMPARISON:  Portable chest 11/05/2021 and earlier, including recent chest CT 10/17/2021. FINDINGS: Portable AP semi upright view at 0527 hours. Stable lung volumes. Normal cardiac size and mediastinal contours. Visualized tracheal air column is within normal limits. Bilateral veiling pulmonary opacity has not significantly changed from yesterday. Right pleural effusion remains diminished compared  to the CT 10/07/2021. Stable pulmonary vascularity, no overt edema. No air bronchograms or pneumothorax. Chronic right 8th rib fracture. No osseous abnormality identified. Paucity of bowel gas in the upper abdomen. IMPRESSION: Stable residual bilateral pleural effusions. No new cardiopulmonary abnormality. Electronically Signed   By: Genevie Ann M.D.   On: 11/14/2021 07:18   PERIPHERAL VASCULAR CATHETERIZATION  Result Date: 11/05/2021 Table formatting from the original result was not included. Images from the original result  were not included.   Patient name: Paul Gonzalez          MRN: [id number]        DOB: 1965/07/15          Sex: male  11/05/2021 Pre-operative Diagnosis: Critical limb ischemia of the bilateral lower extremities with diabetic foot ulcers Post-operative diagnosis:  Same Surgeon:  Marty Heck, MD Procedure Performed: 1.  Ultrasound-guided access left common femoral artery 2.  Aortogram with catheter selection of aorta 3.  Bilateral lower extremity arteriogram with selection of third order branches in the right lower extremity 4.  Right posterior tibial artery angioplasty (2.5 mm x 150 mm Sterling) 5.  Right mid to distal SFA drug-coated balloon angioplasty (5 mm x 150 mm drug-coated impact) 6.  94 minutes of monitored moderate conscious sedation time 7.  Mynx closure of the left common femoral artery  Indications: 56 year old male with end-stage renal disease and diabetes that presents with bilateral lower extremity tissue loss as previously documented.  He is followed by Dr. Sharol Given with orthopedic surgery for his foot ulcers.  He presents today for aortogram, lower extremity arteriogram with initial focus on the right leg.  Risks benefits discussed.  Findings:  Aortogram showed no flow-limiting stenosis in the aortoiliac segment.  The left renal artery was somewhat visualized.  On the right patient had a patent common femoral, profunda, SFA.  The mid to distal SFA had about a 60-70% stenosis  that was multifocal.  Popliteal artery is calcified and diseased but no flow-limiting stenosis.  Dominant runoff is in the posterior tibial that has a multifocal high-grade stenosis greater than 80% in the proximal to mid segment that is diseased for a long segment.  The peroneal is diminutive and occludes well above the ankle.  The anterior tibial has a proximal occlusion.  Small vessel disease in the foot.  Ultimately the right posterior tibial was angioplastied with a 2.5 mm Sterling with excellent results.  The mid to distal SFA was then treated with a 5 mm drug-coated balloon.  Inline flow down the right lower extremity through the posterior tibial.  Left lower extremity shows a patent common femoral, profunda, SFA, popliteal and single vessel runoff in the peroneal that gives a collateral off to the PT at the ankle. Small vessel disease in the foot.             Procedure:  The patient was identified in the holding area and taken to room 8.  The patient was then placed supine on the table and prepped and draped in the usual sterile fashion.  A time out was called.  The patient received Versed and fentanyl for moderate sedation.  I was present for all of sedation and vital signs were monitored including heart rate, respiratory rate, oxygenation and blood pressure.  Ultrasound was used to evaluate the left common femoral artery.  It was patent .  A digital ultrasound image was acquired.  A micropuncture needle was used to access the left common femoral artery under ultrasound guidance.  An 018 wire was advanced without resistance and a micropuncture sheath was placed.  The 018 wire was removed and a benson wire was placed.  The micropuncture sheath was exchanged for a 5 french sheath.  An omniflush catheter was advanced over the wire to the level of L-1.  An abdominal angiogram was obtained.  Next the catheter was pulled down and bilateral lower extremity runoff obtained.  Pertinent findings are noted above.  Initial focus today was on the right leg.  We used a Glidewire advantage to cross the aortic bifurcation with an Omni Flush catheter and ultimately got a wire into the right SFA and exchanged for a long 6 French sheath in the left groin over the aortic bifurcation.  Patient was given 100 units/kg IV heparin.  I then exchanged for a V18 wire with a 018 quick cross and got down to the tibial vessels.  The takeoff of the posterior tibial was very angulated where it was diseased and this was difficult to cannulate and required a lot of maneuvers with different wires and catheters.  Ultimately we finally got a V18 wire down the posterior tibial and the proximal to mid posterior tibial that was heavily diseased with multifocal high-grade stenosis was treated with a 2.5 mm Sterling to nominal pressure for 2 minutes in each segment.  Additional nitroglycerin was given.  I then elected to treat the mid to distal rihgt SFA with a 5 mm drug-coated balloon Impact for 3 minutes with no dissection and no significant residual stenosis.  Final imaging showed preserved runoff down the PT that is now in line.  His flow is sluggish with his heart failure.  Wires and catheters were removed.  Short 6 French sheath was placed in the left groin and a mynx closure device was deployed.   Plan: Patient is optimized in the right lower extremity after SFA and posterior tibial intervention with inline flow down the right lower extremity via the posterior tibial.  The left leg has inline flow down the peroneal as his only tibial runoff and is optimized.  Have loaded on Plavix plus aspirin and statin.  If right foot wounds fail to heal would be a candidate for retrograde right AT access.  Marty Heck, MD [facility] Office: [phone]    DG CHEST PORT 1 VIEW  Result Date: 11/05/2021 CLINICAL DATA:  [id number] with shortness of breath, coughing. EXAM: PORTABLE CHEST 1 VIEW COMPARISON:  Portable chest  11/02/2021 FINDINGS: 5:17 a.m. Bilateral moderate layering pleural effusions are both increased over the prior study. There is overlying hazy atelectasis or airspace disease in the hypoinflated lower zones. The upper lung zones are generally clear. There is mild cardiomegaly, mild central vascular fullness without overt edema. The mediastinum is unchanged. There is calcification of the transverse aorta. Thoracic spondylosis. No pneumothorax. IMPRESSION: 1. Increased bilateral moderate layering pleural effusions with overlying hazy atelectasis or airspace disease. 2. Cardiomegaly with mild central vascular fullness. No overt edema. 3. Aortic atherosclerosis. Electronically Signed   By: Telford Nab M.D.   On: 11/05/2021 07:23    Scheduled Meds:  aspirin EC  81 mg Oral Daily   atorvastatin  20 mg Oral Daily   calcitRIOL  0.25 mcg Oral Q M,W,F-HD   calcium acetate  667 mg Oral TID WC   Chlorhexidine Gluconate Cloth  6 each Topical Q0600   Chlorhexidine Gluconate Cloth  6 each Topical Q0600   chlorpheniramine-HYDROcodone  5 mL Oral Once   clopidogrel  75 mg Oral Daily   darbepoetin ([name]) injection - DIALYSIS  100 mcg Subcutaneous Q Fri-1800   feeding supplement (GLUCERNA SHAKE)  237 mL Oral TID BM   ferrous sulfate  325 mg Oral Q breakfast   insulin aspart  0-5 Units Subcutaneous QHS   insulin aspart  0-6 Units Subcutaneous TID WC   insulin glargine-yfgn  8 Units Subcutaneous QHS   midazolam  midodrine  10 mg Oral TID WC   pantoprazole  40 mg Oral Daily   saccharomyces boulardii  250 mg Oral BID   sodium chloride flush  3 mL Intravenous Q12H   Continuous Infusions:  sodium chloride Stopped (11/09/2021 1510)   anticoagulant sodium citrate     piperacillin-tazobactam (ZOSYN)  IV 2.25 g (11/09/2021 0702)    LOS: 6 days   Raiford Noble, DO [facility] Available via Epic secure chat 7am-7pm After these hours, please refer to coverage provider listed on [email or web address] 11/15/2021, 6:45  PM

## 2021-11-06 NOTE — Progress Notes (Addendum)
Received a call from bedside RN regarding the patient being hypotensive with systolic blood pressure in the 80s and MAP in the 50s.  1 dose of midodrine 10 mg was given without improvement, then 250 cc NS IV fluid bolus and an additional dose of midodrine 10 mg x1 were given.  Due to persistent hypotension IV albumin was also added 12.5 g x 2 doses.  Home midodrine 10 mg 3 times daily is in place.  Continue to closely monitor vital signs.  Maintain MAP greater than 65, as able.  Also reported frequent stools, Florastor 250 mg twice daily added.  Time spent: 35 minutes.

## 2021-11-06 NOTE — Progress Notes (Addendum)
Received patient earlier at the start of the treatment.Alert and oriented x 4.  Access used : Left upper arm fistula -works well during treatment.  Hemodialysis issues; Sbp dropped below 90 x 2 ,Normal saline given 100 cc x2 given per protocol.   Fluid removed : 700 cc,unable to met fluid  goal due to blood pressure issue.  Meds given : None.  Hands off to floor nurse.

## 2021-11-06 NOTE — Anesthesia Preprocedure Evaluation (Signed)
Anesthesia Evaluation  Patient identified by MRN, date of birth, ID band Patient awake    Reviewed: Allergy & Precautions, H&P , NPO status , Patient's Chart, lab work & pertinent test results  Airway Mallampati: II   Neck ROM: full    Dental   Pulmonary shortness of breath 1.5 L pleural effusion drained this admission.   breath sounds clear to auscultation       Cardiovascular hypertension, + Peripheral Vascular Disease  + Valvular Problems/Murmurs AS and AI  Rhythm:regular Rate:Normal  TTE (11/01/21): EF 25%, mod-severe AS, mod-severe AI   Neuro/Psych  PSYCHIATRIC DISORDERS  Depression    negative neurological ROS     GI/Hepatic ,GERD  ,,  Endo/Other  diabetes, Type 2    Renal/GU ESRF and DialysisRenal disease     Musculoskeletal   Abdominal   Peds  Hematology  (+) Blood dyscrasia, anemia   Anesthesia Other Findings   Reproductive/Obstetrics                             Anesthesia Physical Anesthesia Plan  ASA: 4  Anesthesia Plan: MAC   Post-op Pain Management:    Induction: Intravenous  PONV Risk Score and Plan: Propofol infusion and Treatment may vary due to age or medical condition  Airway Management Planned: Nasal Cannula  Additional Equipment:   Intra-op Plan:   Post-operative Plan:   Informed Consent: I have reviewed the patients History and Physical, chart, labs and discussed the procedure including the risks, benefits and alternatives for the proposed anesthesia with the patient or authorized representative who has indicated his/her understanding and acceptance.     Dental advisory given  Plan Discussed with: CRNA, Anesthesiologist and Surgeon  Anesthesia Plan Comments:        Anesthesia Quick Evaluation

## 2021-11-06 NOTE — Plan of Care (Signed)
  Problem: Fluid Volume: Goal: Ability to maintain a balanced intake and output will improve Outcome: Progressing   Problem: Nutritional: Goal: Maintenance of adequate nutrition will improve Outcome: Progressing   Problem: Education: Goal: Knowledge of General Education information will improve Description: Including pain rating scale, medication(s)/side effects and non-pharmacologic comfort measures Outcome: Progressing   Problem: Activity: Goal: Risk for activity intolerance will decrease Outcome: Progressing   Problem: Nutrition: Goal: Adequate nutrition will be maintained Outcome: Progressing

## 2021-11-06 NOTE — Progress Notes (Signed)
[facility] Progress Note   Subjective:  Seen in room - reports that he is still dyspneic although not wearing his O2 right now. No CP. Dialyzed yesterday with only 1L off - fighting hypotension, he remains hypotensive today. His TEE was canceled this AM due to hypotension.  Objective Vitals:   11/24/2021 0833 11/18/2021 0910 12/01/2021 1039 11/12/2021 1310  BP: (!) 89/54 (!) 84/49 (!) 84/49 (!) 84/49  Pulse:   88   Resp:  20 17   Temp:   (!) 97.4 F (36.3 C)   TempSrc:  Oral Oral   SpO2:  96% 96%   Weight:      Height:       Physical Exam General: Chronically ill appearing man, NAD. Room air at the moment Heart: RRR; 2/6 murmur Lungs: CTAB; decreased in bases Abdomen: soft Extremities: No LE edema; multiple bilateral leg/foot wounds Dialysis Access: L AVF + thrill  Additional Objective Labs: Basic Metabolic Panel: Recent Labs  Lab 11/04/21 0053 11/05/21 0045 11/08/2021 0034  NA 137 133* 133*  K 3.4* 4.9 3.3*  CL 108 93* 88*  CO2 20* 23 21*  GLUCOSE 174* 217* 200*  BUN 39* 62* 33*  CREATININE 4.60* 7.37* 4.58*  CALCIUM 6.8* 9.4 7.9*  PHOS 4.2 6.5* 4.5   Liver Function Tests: Recent Labs  Lab 11/01/21 0525 11/02/21 0604 11/04/21 0053 11/05/21 0045 11/25/2021 0034  AST 29  --   --  27 25  ALT 34  --   --  29 23  ALKPHOS 92  --   --  106 88  BILITOT 1.1  --   --  1.0 1.3*  PROT 6.2*  --   --  6.1* 8.3*  ALBUMIN 2.7*   < > 1.7* 2.4* 5.5*   < > = values in this interval not displayed.   CBC: Recent Labs  Lab 11/03/21 0306 11/04/21 0053 11/05/21 0045 11/05/21 1058 11/29/2021 0034  WBC 12.9* 13.0* 16.6* 16.6* 15.0*  NEUTROABS 11.0* 11.5* 14.6* 14.7* 13.7*  HGB 11.2* 8.6* 10.9* 10.7* 9.8*  HCT 35.2* 27.1* 34.7* 34.0* 31.0*  MCV 90.0 90.9 90.6 89.0 90.1  PLT 126* 119* 150 161 152   Blood Culture    Component Value Date/Time   SDES PLEURAL BOTTLES DRAWN AEROBIC AND ANAEROBIC 11/02/2021 0854   SDES PLEURAL 11/02/2021 0854   SPECREQUEST Blood  Culture adequate volume 11/02/2021 0854   SPECREQUEST NONE 11/02/2021 0854   CULT  11/02/2021 0854    NO GROWTH 4 DAYS Performed at [facility], [address], [place], [place] [address]    REPTSTATUS PENDING 11/02/2021 7253   REPTSTATUS 11/02/2021 FINAL 11/02/2021 0854    Studies/Results: DG CHEST PORT 1 VIEW  Result Date: 11/23/2021 CLINICAL DATA:  56 year old male End stage renal disease. Shortness of breath. Bilateral pleural effusions status post right side thoracentesis 11/02/2021. EXAM: PORTABLE CHEST 1 VIEW COMPARISON:  Portable chest 11/05/2021 and earlier, including recent chest CT 10/04/2021. FINDINGS: Portable AP semi upright view at 0527 hours. Stable lung volumes. Normal cardiac size and mediastinal contours. Visualized tracheal air column is within normal limits. Bilateral veiling pulmonary opacity has not significantly changed from yesterday. Right pleural effusion remains diminished compared to the CT 10/16/2021. Stable pulmonary vascularity, no overt edema. No air bronchograms or pneumothorax. Chronic right 8th rib fracture. No osseous abnormality identified. Paucity of bowel gas in the upper abdomen. IMPRESSION: Stable residual bilateral pleural effusions. No new cardiopulmonary abnormality. Electronically Signed   By: Herminio Heads.D.  On: 11/05/2021 07:18   PERIPHERAL VASCULAR CATHETERIZATION  Result Date: 11/05/2021 Table formatting from the original result was not included. Images from the original result were not included.   Patient name: Paul Gonzalez          MRN: [id number]        DOB: 1965-04-30          Sex: male  11/05/2021 Pre-operative Diagnosis: Critical limb ischemia of the bilateral lower extremities with diabetic foot ulcers Post-operative diagnosis:  Same Surgeon:  Marty Heck, MD Procedure Performed: 1.  Ultrasound-guided access left common femoral artery 2.  Aortogram with catheter selection of aorta 3.  Bilateral lower extremity arteriogram with  selection of third order branches in the right lower extremity 4.  Right posterior tibial artery angioplasty (2.5 mm x 150 mm Sterling) 5.  Right mid to distal SFA drug-coated balloon angioplasty (5 mm x 150 mm drug-coated impact) 6.  94 minutes of monitored moderate conscious sedation time 7.  Mynx closure of the left common femoral artery  Indications: 56 year old male with end-stage renal disease and diabetes that presents with bilateral lower extremity tissue loss as previously documented.  He is followed by Dr. Sharol Given with orthopedic surgery for his foot ulcers.  He presents today for aortogram, lower extremity arteriogram with initial focus on the right leg.  Risks benefits discussed.  Findings:  Aortogram showed no flow-limiting stenosis in the aortoiliac segment.  The left renal artery was somewhat visualized.  On the right patient had a patent common femoral, profunda, SFA.  The mid to distal SFA had about a 60-70% stenosis that was multifocal.  Popliteal artery is calcified and diseased but no flow-limiting stenosis.  Dominant runoff is in the posterior tibial that has a multifocal high-grade stenosis greater than 80% in the proximal to mid segment that is diseased for a long segment.  The peroneal is diminutive and occludes well above the ankle.  The anterior tibial has a proximal occlusion.  Small vessel disease in the foot.  Ultimately the right posterior tibial was angioplastied with a 2.5 mm Sterling with excellent results.  The mid to distal SFA was then treated with a 5 mm drug-coated balloon.  Inline flow down the right lower extremity through the posterior tibial.  Left lower extremity shows a patent common femoral, profunda, SFA, popliteal and single vessel runoff in the peroneal that gives a collateral off to the PT at the ankle. Small vessel disease in the foot.             Procedure:  The patient was identified in the holding area and taken to room 8.  The patient was then placed supine on the  table and prepped and draped in the usual sterile fashion.  A time out was called.  The patient received Versed and fentanyl for moderate sedation.  I was present for all of sedation and vital signs were monitored including heart rate, respiratory rate, oxygenation and blood pressure.  Ultrasound was used to evaluate the left common femoral artery.  It was patent .  A digital ultrasound image was acquired.  A micropuncture needle was used to access the left common femoral artery under ultrasound guidance.  An 018 wire was advanced without resistance and a micropuncture sheath was placed.  The 018 wire was removed and a benson wire was placed.  The micropuncture sheath was exchanged for a 5 french sheath.  An omniflush catheter was advanced over the wire to the level of L-1.  An abdominal angiogram was obtained.  Next the catheter was pulled down and bilateral lower extremity runoff obtained.  Pertinent findings are noted above.  Initial focus today was on the right leg.  We used a Glidewire advantage to cross the aortic bifurcation with an Omni Flush catheter and ultimately got a wire into the right SFA and exchanged for a long 6 French sheath in the left groin over the aortic bifurcation.  Patient was given 100 units/kg IV heparin.  I then exchanged for a V18 wire with a 018 quick cross and got down to the tibial vessels.  The takeoff of the posterior tibial was very angulated where it was diseased and this was difficult to cannulate and required a lot of maneuvers with different wires and catheters.  Ultimately we finally got a V18 wire down the posterior tibial and the proximal to mid posterior tibial that was heavily diseased with multifocal high-grade stenosis was treated with a 2.5 mm Sterling to nominal pressure for 2 minutes in each segment.  Additional nitroglycerin was given.  I then elected to treat the mid to distal rihgt SFA with a 5 mm drug-coated balloon Impact for 3 minutes with no dissection and no  significant residual stenosis.  Final imaging showed preserved runoff down the PT that is now in line.  His flow is sluggish with his heart failure.  Wires and catheters were removed.  Short 6 French sheath was placed in the left groin and a mynx closure device was deployed.   Plan: Patient is optimized in the right lower extremity after SFA and posterior tibial intervention with inline flow down the right lower extremity via the posterior tibial.  The left leg has inline flow down the peroneal as his only tibial runoff and is optimized.  Have loaded on Plavix plus aspirin and statin.  If right foot wounds fail to heal would be a candidate for retrograde right AT access.  Marty Heck, MD [facility] Office: [phone]    DG CHEST PORT 1 VIEW  Result Date: 11/05/2021 CLINICAL DATA:  [id number] with shortness of breath, coughing. EXAM: PORTABLE CHEST 1 VIEW COMPARISON:  Portable chest 11/02/2021 FINDINGS: 5:17 a.m. Bilateral moderate layering pleural effusions are both increased over the prior study. There is overlying hazy atelectasis or airspace disease in the hypoinflated lower zones. The upper lung zones are generally clear. There is mild cardiomegaly, mild central vascular fullness without overt edema. The mediastinum is unchanged. There is calcification of the transverse aorta. Thoracic spondylosis. No pneumothorax. IMPRESSION: 1. Increased bilateral moderate layering pleural effusions with overlying hazy atelectasis or airspace disease. 2. Cardiomegaly with mild central vascular fullness. No overt edema. 3. Aortic atherosclerosis. Electronically Signed   By: Telford Nab M.D.   On: 11/05/2021 07:23   Medications:  sodium chloride     anticoagulant sodium citrate     piperacillin-tazobactam (ZOSYN)  IV 2.25 g (11/05/2021 0702)    aspirin EC  81 mg Oral Daily   atorvastatin  20 mg Oral Daily   calcitRIOL  0.25 mcg Oral Q M,W,F-HD   calcium acetate  667 mg Oral  TID WC   Chlorhexidine Gluconate Cloth  6 each Topical Q0600   Chlorhexidine Gluconate Cloth  6 each Topical Q0600   chlorpheniramine-HYDROcodone  5 mL Oral Once   clopidogrel  75 mg Oral Daily   feeding supplement (GLUCERNA SHAKE)  237 mL Oral TID BM   ferrous sulfate  325 mg Oral Q breakfast  insulin aspart  0-5 Units Subcutaneous QHS   insulin aspart  0-6 Units Subcutaneous TID WC   insulin glargine-yfgn  8 Units Subcutaneous QHS   midazolam       midodrine  10 mg Oral TID WC   pantoprazole  40 mg Oral Daily   saccharomyces boulardii  250 mg Oral BID   sodium chloride flush  3 mL Intravenous Q12H    Dialysis Orders:  [facility] MWF 3:45hr 400/500  77 kg 2.0 K/2.5 Ca L AVF  - No heparin - Calcitriol 0.25 mcg PO TIW - Mircera 150 mcg IV q 2 weeks - Venofer 50 mg IV weekly  Assessment/Plan: Sepsis 2/2 Gangrene R great toe: S/P R. posterior tibial artery angioplasty per Dr. Carlis Abbott. Per [facility].   B Fragilis Bacteremia: Remains on course of Zosyn. Acute systolic HF-management per cardiology. Optimize volume with HD.  Bilateral Pleural Effusion: S/p R thoracentesis 11/02/2021 - 1.5L off, Cx negative. Aortic Valve disease-W/U per Cardiology.  ESRD: Usual MWF schedule -> missed Wed, so short HD Thurs and again today to make-up and get back on usual schedule - HD this afternoon. Anemia: Hgb 9.8 - will give [name] with HD today. Secondary hyperparathyroidism - PO4 at goal. Continue binder, VDRA Hypotension/Volume: Continue midodrine '10mg'$  TID and extra prn with dialysis, remains above dry weight. Nutrition - Very low albumin - likely error on 11/3 labs (prob drawn from line where got IV albumin) - continue protein supplements DMT2-management per primary  Veneta Penton, PA-C 11/22/2021, 1:59 PM  [facility]

## 2021-11-06 NOTE — Progress Notes (Addendum)
  Progress Note    11/22/2021 7:50 AM 1 Day Post-Op  Subjective:  no complaints this morning   Vitals:   11/14/2021 0436 12/03/2021 0717  BP: (!) 82/39 (!) 90/50  Pulse: 88   Resp: 18 20  Temp: 97.7 F (36.5 C)   SpO2: 96%    Physical Exam: Lungs:  non labored Incisions:  L groin cath site without hematoma Extremities:  Feet warm to touch with good cap refill; dressing left in place Neurologic: a&O  CBC    Component Value Date/Time   WBC 15.0 (H) 11/23/2021 0034   RBC 3.44 (L) 11/26/2021 0034   HGB 9.8 (L) 11/25/2021 0034   HCT 31.0 (L) 12/01/2021 0034   PLT 152 11/15/2021 0034   MCV 90.1 11/14/2021 0034   MCH 28.5 11/14/2021 0034   MCHC 31.6 12/01/2021 0034   RDW 20.9 (H) 11/13/2021 0034   LYMPHSABS 0.2 (L) 11/19/2021 0034   MONOABS 0.8 11/20/2021 0034   EOSABS 0.1 11/18/2021 0034   BASOSABS 0.0 11/13/2021 0034    BMET    Component Value Date/Time   NA 133 (L) 11/30/2021 0034   NA 147 (H) 06/10/2016 0840   K 3.3 (L) 11/30/2021 0034   CL 88 (L) 11/22/2021 0034   CO2 21 (L) 11/05/2021 0034   GLUCOSE 200 (H) 11/21/2021 0034   BUN 33 (H) 11/29/2021 0034   BUN 20 06/10/2016 0840   CREATININE 4.58 (H) 11/08/2021 0034   CALCIUM 7.9 (L) 11/30/2021 0034   GFRNONAA 14 (L) 11/17/2021 0034   GFRAA 25 (L) 06/10/2016 0840    INR    Component Value Date/Time   INR 1.6 (H) 10/30/2021 1553     Intake/Output Summary (Last 24 hours) at 11/13/2021 0750 Last data filed at 11/05/2021 2200 Gross per 24 hour  Intake 343 ml  Output 1000 ml  Net -657 ml     Assessment/Plan:  56 y.o. male is s/p R SFA and PTA angioplasty  1 Day Post-Op   L groin cath site without hematoma After intervention, he now has a patent R PT onto the foot.  He is also optimized from our standpoint with peroneal only runoff of the LLE Continue aspirin, plavix, statin daily regimen Office will arrange RLE arterial duplex and [name] in 4-6 weeks   Dagoberto Ligas, PA-C [facility] [phone] 11/19/2021 7:50 AM  I have seen and evaluated the patient. I agree with the PA note as documented above.  Postprocedure day 1 status post right SFA and PT angioplasty and now has inline flow down the right lower extremity.  He is optimized from the left lower extremity standpoint as well as he has inline flow with only single-vessel peroneal runoff.  Discussed aspirin Plavix statin daily.  We will arrange follow-up in 1 month.  Dr. Sharol Given is following for tissue loss.  Marty Heck, MD [facility] [place] Office: [phone]

## 2021-11-07 ENCOUNTER — Inpatient Hospital Stay (HOSPITAL_COMMUNITY): Payer: Medicare Other

## 2021-11-07 ENCOUNTER — Encounter (HOSPITAL_COMMUNITY): Payer: Self-pay | Admitting: Internal Medicine

## 2021-11-07 DIAGNOSIS — R7989 Other specified abnormal findings of blood chemistry: Secondary | ICD-10-CM | POA: Diagnosis not present

## 2021-11-07 DIAGNOSIS — I10 Essential (primary) hypertension: Secondary | ICD-10-CM | POA: Diagnosis not present

## 2021-11-07 DIAGNOSIS — N186 End stage renal disease: Secondary | ICD-10-CM | POA: Diagnosis not present

## 2021-11-07 DIAGNOSIS — J90 Pleural effusion, not elsewhere classified: Secondary | ICD-10-CM | POA: Diagnosis not present

## 2021-11-07 LAB — COMPREHENSIVE METABOLIC PANEL
ALT: 23 U/L (ref 0–44)
AST: 19 U/L (ref 15–41)
Albumin: 2.4 g/dL — ABNORMAL LOW (ref 3.5–5.0)
Alkaline Phosphatase: 112 U/L (ref 38–126)
Anion gap: 12 (ref 5–15)
BUN: 27 mg/dL — ABNORMAL HIGH (ref 6–20)
CO2: 25 mmol/L (ref 22–32)
Calcium: 9.1 mg/dL (ref 8.9–10.3)
Chloride: 95 mmol/L — ABNORMAL LOW (ref 98–111)
Creatinine, Ser: 4.58 mg/dL — ABNORMAL HIGH (ref 0.61–1.24)
GFR, Estimated: 14 mL/min — ABNORMAL LOW (ref >60)
Glucose, Bld: 203 mg/dL — ABNORMAL HIGH (ref 70–99)
Potassium: 3.6 mmol/L (ref 3.5–5.1)
Sodium: 132 mmol/L — ABNORMAL LOW (ref 135–145)
Total Bilirubin: 1.2 mg/dL (ref 0.3–1.2)
Total Protein: 6.2 g/dL — ABNORMAL LOW (ref 6.5–8.1)

## 2021-11-07 LAB — CBC WITH DIFFERENTIAL/PLATELET
Abs Immature Granulocytes: 0 10*3/uL (ref 0.00–0.07)
Basophils Absolute: 0.2 10*3/uL — ABNORMAL HIGH (ref 0.0–0.1)
Basophils Relative: 1 %
Eosinophils Absolute: 0.7 10*3/uL — ABNORMAL HIGH (ref 0.0–0.5)
Eosinophils Relative: 4 %
HCT: 33.7 % — ABNORMAL LOW (ref 39.0–52.0)
Hemoglobin: 11 g/dL — ABNORMAL LOW (ref 13.0–17.0)
Lymphocytes Relative: 3 %
Lymphs Abs: 0.5 10*3/uL — ABNORMAL LOW (ref 0.7–4.0)
MCH: 28.6 pg (ref 26.0–34.0)
MCHC: 32.6 g/dL (ref 30.0–36.0)
MCV: 87.5 fL (ref 80.0–100.0)
Monocytes Absolute: 0.4 10*3/uL (ref 0.1–1.0)
Monocytes Relative: 2 %
Neutro Abs: 16.2 10*3/uL — ABNORMAL HIGH (ref 1.7–7.7)
Neutrophils Relative %: 90 %
Platelets: 186 10*3/uL (ref 150–400)
RBC: 3.85 MIL/uL — ABNORMAL LOW (ref 4.22–5.81)
RDW: 21.1 % — ABNORMAL HIGH (ref 11.5–15.5)
WBC: 18 10*3/uL — ABNORMAL HIGH (ref 4.0–10.5)
nRBC: 0 /100 WBC
nRBC: 0.1 % (ref 0.0–0.2)

## 2021-11-07 LAB — MAGNESIUM: Magnesium: 1.8 mg/dL (ref 1.7–2.4)

## 2021-11-07 LAB — CULTURE, BODY FLUID W GRAM STAIN -BOTTLE
Culture: NO GROWTH
Special Requests: ADEQUATE

## 2021-11-07 LAB — GLUCOSE, CAPILLARY
Glucose-Capillary: 184 mg/dL — ABNORMAL HIGH (ref 70–99)
Glucose-Capillary: 292 mg/dL — ABNORMAL HIGH (ref 70–99)
Glucose-Capillary: 218 mg/dL — ABNORMAL HIGH (ref 70–99)
Glucose-Capillary: 196 mg/dL — ABNORMAL HIGH (ref 70–99)

## 2021-11-07 LAB — PHOSPHORUS: Phosphorus: 4.3 mg/dL (ref 2.5–4.6)

## 2021-11-07 NOTE — Progress Notes (Signed)
PROGRESS NOTE    Paul Gonzalez  OJJ:[id number] DOB: 09-Jul-1965 DOA: 10/23/2021 PCP: Coral Spikes, DO   Brief Narrative:  56 y.o. male with medical history significant of Hypertension, hyperlipidemia, ESRD on HD (MWF), anemia of chronic disease, diabetic retinopathy with vision loss who presents to the emergency department due to worsening shortness of breath that has been ongoing for about 2 weeks, this was associated with nonproductive cough.  He  also complained of bilateral diabetic foot ulcers and states that he has not been able to get an appointment with a podiatrist.  He denies chest pain, fever, chills, nausea, vomiting, diarrhea or constipation.   ED Course:  In the emergency department, he was intermittently tachypneic, BP was soft at 105/52, other vital signs were within normal range.  Work-up in the ED showed leukocytosis, normocytic anemia, BMP showed sodium 135, potassium 4.9, chloride 93, bicarb 25, glucose 173, BUN 45, creatinine 5.14, albumin 3.3, AST 57, ALT 47, total bilirubin 1.6.  Anion gap 17, lactic acid 2.4 > 2.8, BNP > 4,500.  Influenza A, B, SARS coronavirus 2 was negative.   Right foot x-ray showed no fracture or dislocation of the right foot.  No radiographic findings of osteomyelitis.  Soft tissue wound over the medial great toe.  Soft tissue edema of the forefoot.  Chest x-ray showed cardiomegaly without failure.  Bilateral pleural effusion and associated airspace disease, likely atelectasis, right greater than left.  He was treated with IV ceftriaxone, vancomycin and azithromycin.  Midodrine was given.  IV hydration was provided.  Hospitalist was asked to admit patient for further evaluation and management.  Orthopedic surgeon (Dr. Erlinda Hong) was consulted and patient will be seen when he arrives at [facility].    **Interim History Orthopedic surgery evaluated and recommended ABIs and also recommended vascular evaluation.  Vascular surgery is now evaluating and plan is for  aortogram with lower extremity arteriogram.  Nephrology is also been consulted and plan is for dialysis later today.   11/16/2021.  Patient was supposed to go for a TEE today given his bacteremia but this was canceled due to his hypotension.  Vascular surgery feels that he is optimized from the left lower extremity standpoint as well as he has inline flow with only single-vessel peroneal runoff.  They are recommending discussing aspirin and Plavix as well as statin daily and recommending following up in a month.  No allergies taking the patient back to dialysis again today and he remains on IV Zosyn.  May need to broaden his antibiotics given that his WBC remains elevated but slowly trending down.  11/07/2021: Patient's WBC is trending back up again and now have asked ID to weigh in given his current condition.  Patient continues to complain of some shortness breath and the TEE is being rescheduled for Monday, 11/09/2021.  Nephrology recommending next dialysis session on Monday and has chest x-ray showing bilateral effusions and may be laying which makes the x-ray look worse.  Has a poor prognosis overall.  We will need to follow-up on orthopedic surgery evaluation and recommendations as well.  Assessment and Plan:  Sepsis secondary to Gangrene Right Toe with Cellulitis -Patient will likely require amputation of gangrenous tissue -Continue antibiotics for B fragilis coverage with IV zosyn -Plan is for Aortogram with lower extremity arteriogram with possible intervention tomorrow -[name]'s ordered and showed and reveals [place] vessels on the right with biphasic waveforms and dampened monophasic waveforms on the left.  -Patient undergoing an aortogram with lower extremity arteriogram  today given that there is tissue loss and he had a right posterior tibial artery angioplasty and right mid to distal SFV drug-coated balloon angioplasty and has been optimized in the right lower extremity after SFA and posterior tibial  intervention with inline flow down the right lower extremity with via the posterior tibial; patient was loaded with Plavix and aspirin and statin and they feel that if the right foot wounds fail to heal he would be a candidate for retrograde right AT access -WBC has gone from 13.0 -> 16.6 x2 -> 15.0 -> 18.0 -Orthopedic Surgery consulted and will follow up after Vascular Surgery evaluation -Vascular feels that he has been optimized from their standpoint -Awaiting Ortho recc's    New finding of Cardiomyopathy  Acute Systolic CHF/HFrEF Volume overload -Fluid management with hemodialysis  -Midodrine dose increased due to soft BPs limiting volume removal for HD -Echo with findings of change in EF now down to 20-25% with severely decreased function of the left ventricle demonstrates global hypokinesis and left ventricular diastolic parameters consistent with grade 3 diastolic dysfunction and restricted as well as the right ventricular systolic function is mildly reduced; patient's aortic valve regurgitation is moderate to severe and moderate to severe aortic valve stenosis was noted with a gradient of 36 mmHg metrics despite decrease stroke-volume index -BNP was >4,500 -Inpatient cardiology consultation requested -Pt may require TEE to better assess for valvular disease and cardiology recommending and planning for this later this week -Appreciate cardiology team recs; they will follow him at [facility]  -Volume maintenance is via dialysis and he underwent thoracentesis of the right pleural effusion on 11/02/2021 with removal 1.5 L of fluid. -He is not on any GDMT due to hypotension requiring midodrine and ESRD -Cardiology is unclear whether cardiomyopathy secondary to progressive aortic valve disease but they would not pursue any invasive work-up at this time given his acute bacteremia; TEE canceled and rescheduled for Monday 11/09/21 -Getting volume maintenance by dialysis if tolerated   B Fragilis  Bacteremia -My colleague discussed with Pharmacist and the patient waschanged to IV Zosyn for better coverage -Patient remains on antibiotic coverage with IV Zosyn and WBC is trending upwards in 1-16.6 is again stable at 16.6 -Cardiology recommending TEE given his bacteremia and has echo that was different from the one done in March and there unable to perform it at the same time as a peripheral arteriogram so they are planning for this on Friday, 11/28/2021 however this had to be canceled due to his hypotension -Will consult ID for further evaluation and recommendations    ESRD on HD Elevated anion gap metabolic Acidosis -He is on schedule with MWF HD treatments -Nephrology was consulted regarding inpatient HD treatments -Electrolytes are stable at this time and Na+ improved from 132 -> 137 -> 132 but K+ is now 3.6 -Patient has a slight metabolic acidosis with a CO2 of 21, anion gap of 24, chloride level of 88 -Patient's BUN/Cr went from 63/6.21 -> 40/5.04 -> 39/4.60 -> 62/7.37 -> 33/4.58 -> 27/4.58 -Nephrology team has been consulted and plan is for Dialysis again on Monday    Hypokalemia Hypomagnesemia -As above -Patient's K+ is now 3.6 and magnesium level is now 1.8 -Continue to Monitor and Trend and likely to be repleted in dialysis -Repeat CMP in the AM    Lactic Acidosis -Last Lactate down to 2.1 now   Bilateral Pleural Effusions R>L -Thoracentesis ordered for large right effusion, completed 10/30 with 1.5L removed -Continue supportive care and volume management  with HD for now -Not currently with any symptoms of respiratory distress -Repeat CXR done this AM and showed "Increased bilateral moderate layering pleural effusions with overlying hazy atelectasis or airspace disease. Cardiomegaly with mild central vascular fullness. No overt edema. Aortic atherosclerosis." -Repeat chest x-ray today showed "Stable residual bilateral pleural effusions. No new cardiopulmonary  abnormality." -Manage as per dialysis and may need a repeat thoracentesis -   Leukocytosis  -Continue to monitor CBC/diff as response to treating infection -Follow-up blood cultures -Patient's WBC went from 15.8 -> 12.9 -> 13.0 -> 16.6 x2 -> 15.0 -> 18.0 -Continue to Monitor and Trend and repeat CBC in the AM  -We will get infectious disease involvement given his continued elevation in WBC   Diabetes mellitus with neurological, renal, vascular complications -Continue very sensitive NovoLog sliding scale before meals and at bedtime SSI coverage, daily lantus 8 units for basal coverage, frequent CBG monitoring -Continue monitor CBGs per protocol and CBGs ranging from 150-171   Chronic Hypotension  -Had resumed Midodrine, dose increased by nephrology due to soft BPs limiting HD volume removal and also prevented him from getting a TEE -Continue to monitor blood pressures per protocol -Last blood pressure reading was 95/57   Normocytic Anemia -Patient's Hgb/Hct went from 11.2/35.2 -> 8.6/27.1 -> 10.9/34.7 -> 10.7/34.0 -> 9.8/31.0 -> 11.0/33.7 -Check Anemia Panel in the AM  -Continue to Monitor for S/Sx of Bleeding; No overt bleeding noted -Repeat CBC in the AM    Thrombocytopenia -Patient's Plaletet Count went from Platelet went from 126 -> 119 -> 150 -> 161 -> 152 -> 186 -Continue to Monitor for S/Sx of Bleeding; No overt bleeding noted -Repeat CBC in the AM    Hypoalbuminemia -Patient's Albumin Level went from 2.7 -> 2.6 -> 2.6 -> 1.7 -> 2.4 and yesterday it is 5.5 and question accuracy and repeat this AM is 2.4 -Continue to Monitor and Trend -Repeat CMP in the AM    Hyponatremia -Patient's sodium is now 132 again -Likely to be repleted in dialysis and corrected there -Continue monitor and trend and repeat CMP in a.m. -Repeat CMP in the AM   DVT prophylaxis: SCDs Start: 10/21/2021 2114    Code Status: Full Code Family Communication: No family present at bedside   Disposition  Plan:  Level of care: Telemetry Medical Status is: Inpatient Remains inpatient appropriate because: Continues to feel short of breath and needs specialist clearance   Consultants:  Nephrology Cardiology Orthopedic Surgery Vascular Surgery Infectious Diseases   Procedures:  As delineated as above  ECHOCARDIOGRAM IMPRESSIONS     1. Left ventricular ejection fraction, by estimation, is 25 to 30%. The  left ventricle has severely decreased function. The left ventricle  demonstrates global hypokinesis. Left ventricular diastolic parameters are  consistent with Grade III diastolic  dysfunction (restrictive).   2. Right ventricular systolic function is mildly reduced. The right  ventricular size is severely enlarged. There is moderately elevated  pulmonary artery systolic pressure. The estimated right ventricular  systolic pressure is 38.1 mmHg.   3. Left atrial size was severely dilated.   4. Right atrial size was mildly dilated.   5. A small pericardial effusion is present. The pericardial effusion is  circumferential.   6. The mitral valve is abnormal. Mild mitral valve regurgitation. Mild  mitral stenosis. Moderate mitral annular calcification.   7. The aortic valve is calcified. Aortic valve regurgitation is moderate  to severe. Moderate to severe aortic valve stenosis. GRadient of 36 mm Hg  at  max despite decreased strok volume index. DVI 0.20.   Comparison(s): Significant changes from prior. Atempting to reach primary  team.   FINDINGS   Left Ventricle: Left ventricular ejection fraction, by estimation, is 25  to 30%. The left ventricle has severely decreased function. The left  ventricle demonstrates global hypokinesis. The left ventricular internal  cavity size was normal in size. There  is no left ventricular hypertrophy. Left ventricular diastolic parameters  are consistent with Grade III diastolic dysfunction (restrictive).   Right Ventricle: The right  ventricular size is severely enlarged. No  increase in right ventricular wall thickness. Right ventricular systolic  function is mildly reduced. There is moderately elevated pulmonary artery  systolic pressure. The tricuspid  regurgitant velocity is 2.82 m/s, and with an assumed right atrial  pressure of 15 mmHg, the estimated right ventricular systolic pressure is  83.3 mmHg.   Left Atrium: Left atrial size was severely dilated.   Right Atrium: Right atrial size was mildly dilated.   Pericardium: A small pericardial effusion is present. The pericardial  effusion is circumferential. Presence of epicardial fat layer.   Mitral Valve: The mitral valve is abnormal. Moderate mitral annular  calcification. Mild mitral valve regurgitation. Mild mitral valve  stenosis.   Tricuspid Valve: The tricuspid valve is normal in structure. Tricuspid  valve regurgitation is mild . No evidence of tricuspid stenosis.   Aortic Valve: The aortic valve is calcified. Aortic valve regurgitation is  moderate to severe. Aortic regurgitation PHT measures 160 msec. Moderate  to severe aortic stenosis is present. Aortic valve mean gradient measures  36.0 mmHg. Aortic valve peak  gradient measures 59.9 mmHg. Aortic valve area, by VTI measures 0.54 cm.   Pulmonic Valve: The pulmonic valve was grossly normal. Pulmonic valve  regurgitation is trivial. No evidence of pulmonic stenosis.   Aorta: The aortic root is normal in size and structure.   IAS/Shunts: No atrial level shunt detected by color flow Doppler.     LEFT VENTRICLE  PLAX 2D  LVIDd:         5.50 cm  LVIDs:         4.70 cm  LV PW:         1.10 cm  LV IVS:        1.00 cm  LVOT diam:     1.90 cm  LV SV:         46  LV SV Index:   24  LVOT Area:     2.84 cm    LV Volumes (MOD)  LV vol d, MOD A2C: 177.0 ml  LV vol d, MOD A4C: 211.0 ml  LV vol s, MOD A2C: 116.0 ml  LV vol s, MOD A4C: 133.0 ml  LV SV MOD A2C:     61.0 ml  LV SV MOD A4C:      211.0 ml  LV SV MOD BP:      73.3 ml   RIGHT VENTRICLE  RV S prime:     7.62 cm/s  TAPSE (M-mode): 1.5 cm   LEFT ATRIUM              Index        RIGHT ATRIUM           Index  LA diam:        5.30 cm  2.75 cm/m   RA Area:     21.40 cm  LA Vol (A2C):   148.0 ml 76.76 ml/m  RA Volume:  71.10 ml  36.88 ml/m  LA Vol (A4C):   143.0 ml 74.17 ml/m  LA Biplane Vol: 148.0 ml 76.76 ml/m   AORTIC VALVE  AV Area (Vmax):    0.55 cm  AV Area (Vmean):   0.64 cm  AV Area (VTI):     0.54 cm  AV Vmax:           387.00 cm/s  AV Vmean:          232.500 cm/s  AV VTI:            0.863 m  AV Peak Grad:      59.9 mmHg  AV Mean Grad:      36.0 mmHg  LVOT Vmax:         75.70 cm/s  LVOT Vmean:        52.500 cm/s  LVOT VTI:          0.164 m  LVOT/AV VTI ratio: 0.19  AI PHT:            160 msec    AORTA  Ao Root diam: 3.10 cm   MITRAL VALVE                TRICUSPID VALVE  MV Area (PHT): 6.17 cm     TR Peak grad:   31.8 mmHg  MV Decel Time: 123 msec     TR Vmax:        282.00 cm/s  MR Peak grad: 81.0 mmHg  MR Mean grad: 49.0 mmHg     SHUNTS  MR Vmax:      450.00 cm/s   Systemic VTI:  0.16 m  MR Vmean:     322.0 cm/s    Systemic Diam: 1.90 cm  MV E velocity: 114.00 cm/s  MV A velocity: 34.70 cm/s  MV E/A ratio:  3.29    Procedure Performed: 1.  Ultrasound-guided access left common femoral artery 2.  Aortogram with catheter selection of aorta 3.  Bilateral lower extremity arteriogram with selection of third order branches in the right lower extremity 4.  Right posterior tibial artery angioplasty (2.5 mm x 150 mm [place]) 5.  Right mid to distal SFA drug-coated balloon angioplasty (5 mm x 150 mm drug-coated impact) 6.  94 minutes of monitored moderate conscious sedation time 7.  Mynx closure of the left common femoral artery    Antimicrobials:  Anti-infectives (From admission, onward)    Start     Dose/Rate Route Frequency Ordered Stop   11/04/21 2300  piperacillin-tazobactam  (ZOSYN) IVPB 2.25 g  Status:  Discontinued        2.25 g 100 mL/hr over 30 Minutes Intravenous Every 8 hours 11/03/21 2305 11/04/21 0004   11/04/21 0015  piperacillin-tazobactam (ZOSYN) IVPB 2.25 g        2.25 g 100 mL/hr over 30 Minutes Intravenous Every 8 hours 11/04/21 0005     11/02/21 1800  ceFEPIme (MAXIPIME) 2 g in sodium chloride 0.9 % 100 mL IVPB  Status:  Discontinued        2 g 200 mL/hr over 30 Minutes Intravenous Every M-W-F (1800) 10/26/2021 2119 11/02/21 0947   11/02/21 1200  vancomycin (VANCOREADY) IVPB 750 mg/150 mL  Status:  Discontinued        750 mg 150 mL/hr over 60 Minutes Intravenous Every M-W-F (Hemodialysis) 10/11/2021 2119 11/02/21 0948   11/02/21 1200  piperacillin-tazobactam (ZOSYN) IVPB 2.25 g  Status:  Discontinued        2.25 g  100 mL/hr over 30 Minutes Intravenous Every 8 hours 11/02/21 0952 11/02/21 1014   11/02/21 1200  piperacillin-tazobactam (ZOSYN) 2.25 g in sodium chloride 0.9 % 50 mL IVPB  Status:  Discontinued        2.25 g 100 mL/hr over 30 Minutes Intravenous Every 8 hours 11/02/21 1014 11/03/21 2305   10/08/2021 2130  vancomycin (VANCOREADY) IVPB 500 mg/100 mL        500 mg 100 mL/hr over 60 Minutes Intravenous  Once 10/08/2021 2119 11/01/21 0152   10/28/2021 2100  vancomycin (VANCOREADY) IVPB 1500 mg/300 mL  Status:  Discontinued        1,500 mg 150 mL/hr over 120 Minutes Intravenous  Once 10/23/2021 2057 10/30/2021 2119   10/30/2021 2100  ceFEPIme (MAXIPIME) 2 g in sodium chloride 0.9 % 100 mL IVPB        2 g 200 mL/hr over 30 Minutes Intravenous  Once 10/10/2021 2057 11/01/21 0152   10/14/2021 1630  vancomycin (VANCOCIN) IVPB 1000 mg/200 mL premix        1,000 mg 200 mL/hr over 60 Minutes Intravenous  Once 10/12/2021 1628 10/05/2021 1818   10/25/2021 1530  cefTRIAXone (ROCEPHIN) 2 g in sodium chloride 0.9 % 100 mL IVPB  Status:  Discontinued        2 g 200 mL/hr over 30 Minutes Intravenous Every 24 hours 10/07/2021 1522 10/23/2021 2119   10/16/2021 1530  azithromycin  (ZITHROMAX) 500 mg in sodium chloride 0.9 % 250 mL IVPB  Status:  Discontinued        500 mg 250 mL/hr over 60 Minutes Intravenous Every 24 hours 10/13/2021 1522 11/02/21 0947       Subjective: Seen and examined at bedside and he was having some pain today and also complained about some shortness of breath but looks calm.  Blood pressure still remains on the lower side.  Also asking for some cough medication.  No other concerns at this time and plan is for dialysis Monday as well as TEE.  Given his increasing WBC will consult infectious diseases  Objective: Vitals:   11/07/21 0044 11/07/21 0330 11/07/21 0907 11/07/21 1134  BP: (!) 93/59 (!) 90/57 (!) 90/58 (!) 95/57  Pulse: 87 87    Resp: '20 16 18   '$ Temp: (!) 97.5 F (36.4 C) 98.4 F (36.9 C) 98.1 F (36.7 C)   TempSrc: Oral Oral Oral   SpO2: 98% 90% 99%   Weight:  80.3 kg    Height:        Intake/Output Summary (Last 24 hours) at 11/07/2021 1617 Last data filed at 11/19/2021 1834 Gross per 24 hour  Intake --  Output 700 ml  Net -700 ml   Filed Weights   11/28/2021 1515 11/23/2021 1834 11/07/21 0330  Weight: 97.8 kg 82.2 kg 80.3 kg   Examination: Physical Exam:  Constitutional: WN/WD overweight chronically ill-appearing Caucasian male currently no acute distress appears slightly uncomfortable complaining of some mild dyspnea Respiratory: Diminished to auscultation bilaterally with coarse breath sounds and is wearing supplemental oxygen via nasal cannula today and has some slight wheezing and crackles. Normal respiratory effort and patient is not tachypenic. No accessory muscle use.  Cardiovascular: RRR, no murmurs / rubs / gallops. S1 and S2 auscultated.  Has some lower extremity edema Abdomen: Soft, non-tender, slightly just distended secondary to body habitus.  Bowel sounds positive.  GU: Deferred. Musculoskeletal: No clubbing / cyanosis of digits/nails. No joint deformity upper and lower extremities.  Skin: Tissue loss noted  on his lower extremity especially his right big toe and left heel and is dry gangrene Neurologic: CN 2-12 grossly intact with no focal deficits. Romberg sign and cerebellar reflexes not assessed.  Psychiatric: Normal judgment and insight. Alert and oriented x 3. Normal mood and appropriate affect.   Data Reviewed: I have personally reviewed following labs and imaging studies  CBC: Recent Labs  Lab 11/04/21 0053 11/05/21 0045 11/05/21 1058 11/19/2021 0034 11/07/21 0054  WBC 13.0* 16.6* 16.6* 15.0* 18.0*  NEUTROABS 11.5* 14.6* 14.7* 13.7* 16.2*  HGB 8.6* 10.9* 10.7* 9.8* 11.0*  HCT 27.1* 34.7* 34.0* 31.0* 33.7*  MCV 90.9 90.6 89.0 90.1 87.5  PLT 119* 150 161 152 366   Basic Metabolic Panel: Recent Labs  Lab 11/01/21 0525 11/02/21 0604 11/03/21 0306 11/04/21 0053 11/05/21 0045 11/25/2021 0034 11/07/21 0054  NA 134*   < > 132* 137 133* 133* 132*  K 4.9   < > 3.9 3.4* 4.9 3.3* 3.6  CL 96*   < > 95* 108 93* 88* 95*  CO2 25   < > 27 20* 23 21* 25  GLUCOSE 156*   < > 150* 174* 217* 200* 203*  BUN 49*   < > 40* 39* 62* 33* 27*  CREATININE 5.27*   < > 5.04* 4.60* 7.37* 4.58* 4.58*  CALCIUM 9.7   < > 8.8* 6.8* 9.4 7.9* 9.1  MG 2.1  --   --   --  2.2 1.6* 1.8  PHOS 7.0*   < > 5.5* 4.2 6.5* 4.5 4.3   < > = values in this interval not displayed.   GFR: Estimated Creatinine Clearance: 18 mL/min (A) (by C-G formula based on SCr of 4.58 mg/dL (H)). Liver Function Tests: Recent Labs  Lab 11/01/21 0525 11/02/21 0604 11/03/21 0306 11/04/21 0053 11/05/21 0045 11/07/2021 0034 11/07/21 0054  AST 29  --   --   --  '27 25 19  '$ ALT 34  --   --   --  '29 23 23  '$ ALKPHOS 92  --   --   --  106 88 112  BILITOT 1.1  --   --   --  1.0 1.3* 1.2  PROT 6.2*  --   --   --  6.1* 8.3* 6.2*  ALBUMIN 2.7*   < > 2.6* 1.7* 2.4* 5.5* 2.4*   < > = values in this interval not displayed.   No results for input(s): "LIPASE", "AMYLASE" in the last 168 hours. No results for input(s): "AMMONIA" in the last 168  hours. Coagulation Profile: No results for input(s): "INR", "PROTIME" in the last 168 hours. Cardiac Enzymes: No results for input(s): "CKTOTAL", "CKMB", "CKMBINDEX", "TROPONINI" in the last 168 hours. BNP (last 3 results) No results for input(s): "PROBNP" in the last 8760 hours. HbA1C: No results for input(s): "HGBA1C" in the last 72 hours. CBG: Recent Labs  Lab 11/27/2021 0613 11/12/2021 1042 11/27/2021 2123 11/07/21 0618 11/07/21 1140  GLUCAP 150* 170* 176* 184* 292*   Lipid Profile: Recent Labs    11/28/2021 0034  CHOL 63  HDL 16*  LDLCALC 29  TRIG 90  CHOLHDL 3.9   Thyroid Function Tests: No results for input(s): "TSH", "T4TOTAL", "FREET4", "T3FREE", "THYROIDAB" in the last 72 hours. Anemia Panel: No results for input(s): "VITAMINB12", "FOLATE", "FERRITIN", "TIBC", "IRON", "RETICCTPCT" in the last 72 hours. Sepsis Labs: Recent Labs  Lab 10/12/2021 1801 10/10/2021 2101  LATICACIDVEN 2.8* 2.1*    Recent Results (from the past 240 hour(s))  Resp Panel by RT-PCR (Flu A&B, Covid) Anterior Nasal Swab     Status: None   Collection Time: 10/25/2021  3:22 PM   Specimen: Anterior Nasal Swab  Result Value Ref Range Status   SARS Coronavirus 2 by RT PCR NEGATIVE NEGATIVE Final    Comment: (NOTE) SARS-CoV-2 target nucleic acids are NOT DETECTED.  The SARS-CoV-2 RNA is generally detectable in upper respiratory specimens during the acute phase of infection. The lowest concentration of SARS-CoV-2 viral copies this assay can detect is 138 copies/mL. A negative result does not preclude SARS-Cov-2 infection and should not be used as the sole basis for treatment or other patient management decisions. A negative result may occur with  improper specimen collection/handling, submission of specimen other than nasopharyngeal swab, presence of viral mutation(s) within the areas targeted by this assay, and inadequate number of viral copies(<138 copies/mL). A negative result must be combined  with clinical observations, patient history, and epidemiological information. The expected result is Negative.  Fact Sheet for Patients:  EntrepreneurPulse.com.au  Fact Sheet for Healthcare Providers:  IncredibleEmployment.be  This test is no t yet approved or cleared by the [place] FDA and  has been authorized for detection and/or diagnosis of SARS-CoV-2 by FDA under an Emergency Use Authorization (EUA). This EUA will remain  in effect (meaning this test can be used) for the duration of the COVID-19 declaration under Section 564(b)(1) of the Act, 21 U.S.C.section 360bbb-3(b)(1), unless the authorization is terminated  or revoked sooner.       Influenza A by PCR NEGATIVE NEGATIVE Final   Influenza B by PCR NEGATIVE NEGATIVE Final    Comment: (NOTE) The Xpert Xpress SARS-CoV-2/FLU/RSV plus assay is intended as an aid in the diagnosis of influenza from Nasopharyngeal swab specimens and should not be used as a sole basis for treatment. Nasal washings and aspirates are unacceptable for Xpert Xpress SARS-CoV-2/FLU/RSV testing.  Fact Sheet for Patients: EntrepreneurPulse.com.au  Fact Sheet for Healthcare Providers: IncredibleEmployment.be  This test is not yet approved or cleared by the [place] FDA and has been authorized for detection and/or diagnosis of SARS-CoV-2 by FDA under an Emergency Use Authorization (EUA). This EUA will remain in effect (meaning this test can be used) for the duration of the COVID-19 declaration under Section 564(b)(1) of the Act, 21 U.S.C. section 360bbb-3(b)(1), unless the authorization is terminated or revoked.  Performed at [facility], [address], [place], [place] [address]   Blood Culture (routine x 2)     Status: None   Collection Time: 11/03/2021  3:53 PM   Specimen: BLOOD RIGHT ARM  Result Value Ref Range Status   Specimen Description BLOOD RIGHT ARM   Final   Special Requests   Final    BOTTLES DRAWN AEROBIC AND ANAEROBIC Blood Culture adequate volume   Culture   Final    NO GROWTH 5 DAYS Performed at [facility], [address], [place], [place] [address]    Report Status 11/05/2021 FINAL  Final  Blood Culture (routine x 2)     Status: Abnormal   Collection Time: 10/22/2021  3:54 PM   Specimen: Right Antecubital; Blood  Result Value Ref Range Status   Specimen Description   Final    RIGHT ANTECUBITAL Performed at [facility], [address], [place], [place] [address]    Special Requests   Final    BOTTLES DRAWN AEROBIC AND ANAEROBIC Blood Culture results may not be optimal due to an excessive volume of blood received in  culture bottles Performed at [facility], [address] [place], [place] [address]    Culture  Setup Time   Final    GRAM NEGATIVE RODS Gram Stain Report Called to,Read Back By and Verified With:  J. LONG @ 1512 BY STEPHTR 11/01/21 CRITICAL RESULT CALLED TO, READ BACK BY AND VERIFIED WITH: E PREYER,RN'@0125'$  0/30/23 [place]    Culture (A)  Final    BACTEROIDES FRAGILIS BETA LACTAMASE POSITIVE Performed at [facility], [place] [address], [place], [place] [address]    Report Status 11/03/2021 FINAL  Final  Blood Culture ID Panel (Reflexed)     Status: Abnormal   Collection Time: 10/13/2021  3:54 PM  Result Value Ref Range Status   Enterococcus faecalis NOT DETECTED NOT DETECTED Final   Enterococcus Faecium NOT DETECTED NOT DETECTED Final   Listeria monocytogenes NOT DETECTED NOT DETECTED Final   Staphylococcus species NOT DETECTED NOT DETECTED Final   Staphylococcus aureus (BCID) NOT DETECTED NOT DETECTED Final   Staphylococcus epidermidis NOT DETECTED NOT DETECTED Final   Staphylococcus lugdunensis NOT DETECTED NOT DETECTED Final   Streptococcus species NOT DETECTED NOT DETECTED Final   Streptococcus agalactiae NOT DETECTED NOT DETECTED Final   Streptococcus pneumoniae NOT DETECTED NOT DETECTED Final    Streptococcus pyogenes NOT DETECTED NOT DETECTED Final   A.calcoaceticus-baumannii NOT DETECTED NOT DETECTED Final   Bacteroides fragilis DETECTED (A) NOT DETECTED Final    Comment: CRITICAL RESULT CALLED TO, READ BACK BY AND VERIFIED WITH: E PREYER,RN'@0125'$  11/02/21 [place]    Enterobacterales NOT DETECTED NOT DETECTED Final   Enterobacter cloacae complex NOT DETECTED NOT DETECTED Final   Escherichia coli NOT DETECTED NOT DETECTED Final   Klebsiella aerogenes NOT DETECTED NOT DETECTED Final   Klebsiella oxytoca NOT DETECTED NOT DETECTED Final   Klebsiella pneumoniae NOT DETECTED NOT DETECTED Final   Proteus species NOT DETECTED NOT DETECTED Final   Salmonella species NOT DETECTED NOT DETECTED Final   Serratia marcescens NOT DETECTED NOT DETECTED Final   Haemophilus influenzae NOT DETECTED NOT DETECTED Final   Neisseria meningitidis NOT DETECTED NOT DETECTED Final   Pseudomonas aeruginosa NOT DETECTED NOT DETECTED Final   Stenotrophomonas maltophilia NOT DETECTED NOT DETECTED Final   Candida albicans NOT DETECTED NOT DETECTED Final   Candida auris NOT DETECTED NOT DETECTED Final   Candida glabrata NOT DETECTED NOT DETECTED Final   Candida krusei NOT DETECTED NOT DETECTED Final   Candida parapsilosis NOT DETECTED NOT DETECTED Final   Candida tropicalis NOT DETECTED NOT DETECTED Final   Cryptococcus neoformans/gattii NOT DETECTED NOT DETECTED Final    Comment: Performed at [facility], [place] [address] [place], [place] [address]  Culture, body fluid w Gram Stain-bottle     Status: None   Collection Time: 11/02/21  8:54 AM   Specimen: Pleura  Result Value Ref Range Status   Specimen Description PLEURAL BOTTLES DRAWN AEROBIC AND ANAEROBIC  Final   Special Requests Blood Culture adequate volume  Final   Culture   Final    NO GROWTH 5 DAYS Performed at [facility], [address] [place], [place] [address]    Report Status 11/07/2021 FINAL  Final  Gram stain     Status: None    Collection Time: 11/02/21  8:54 AM   Specimen: Pleura  Result Value Ref Range Status   Specimen Description PLEURAL  Final   Special Requests NONE  Final   Gram Stain   Final    NO ORGANISMS SEEN WBC  PRESENT, PREDOMINANTLY PMN CYTOSPIN SMEAR Performed at [facility], [address], [place], [place] [address]    Report Status 11/02/2021 FINAL  Final    Radiology Studies: DG CHEST PORT 1 VIEW  Result Date: 11/07/2021 CLINICAL DATA:  Shortness of breath. EXAM: PORTABLE CHEST 1 VIEW COMPARISON:  11/21/2021 FINDINGS: The heart size and mediastinal contours are within normal limits. Aortic atherosclerotic calcification incidentally noted. Low lung volumes are again demonstrated with bibasilar atelectasis. Small bilateral pleural effusions also unchanged. Persistent symmetric bilateral perihilar opacities are seen with a somewhat bat wing appearance, suspicious for pulmonary edema. IMPRESSION: No significant change in low lung volumes, bibasilar atelectasis, and small bilateral pleural effusions. Central mild pulmonary edema also suspected. Electronically Signed   By: Marlaine [place] M.D.   On: 11/07/2021 09:03   DG CHEST PORT 1 VIEW  Result Date: 11/04/2021 CLINICAL DATA:  56 year old male End stage renal disease. Shortness of breath. Bilateral pleural effusions status post right side thoracentesis 11/02/2021. EXAM: PORTABLE CHEST 1 VIEW COMPARISON:  Portable chest 11/05/2021 and earlier, including recent chest CT 10/16/2021. FINDINGS: Portable AP semi upright view at 0527 hours. Stable lung volumes. Normal cardiac size and mediastinal contours. Visualized tracheal air column is within normal limits. Bilateral veiling pulmonary opacity has not significantly changed from yesterday. Right pleural effusion remains diminished compared to the CT 10/29/2021. Stable pulmonary vascularity, no overt edema. No air bronchograms or pneumothorax. Chronic right 8th rib fracture. No osseous abnormality identified.  Paucity of bowel gas in the upper abdomen. IMPRESSION: Stable residual bilateral pleural effusions. No new cardiopulmonary abnormality. Electronically Signed   By: Genevie Ann M.D.   On: 11/21/2021 07:18    Scheduled Meds:  aspirin EC  81 mg Oral Daily   atorvastatin  20 mg Oral Daily   calcitRIOL  0.25 mcg Oral Q M,W,F-HD   calcium acetate  667 mg Oral TID WC   Chlorhexidine Gluconate Cloth  6 each Topical Q0600   Chlorhexidine Gluconate Cloth  6 each Topical Q0600   chlorpheniramine-HYDROcodone  5 mL Oral Once   clopidogrel  75 mg Oral Daily   darbepoetin ([name]) injection - DIALYSIS  100 mcg Subcutaneous Q Fri-1800   feeding supplement (GLUCERNA SHAKE)  237 mL Oral TID BM   ferrous sulfate  325 mg Oral Q breakfast   insulin aspart  0-5 Units Subcutaneous QHS   insulin aspart  0-6 Units Subcutaneous TID WC   insulin glargine-yfgn  8 Units Subcutaneous QHS   midodrine  10 mg Oral TID WC   pantoprazole  40 mg Oral Daily   saccharomyces boulardii  250 mg Oral BID   sodium chloride flush  3 mL Intravenous Q12H   Continuous Infusions:  sodium chloride 250 mL (11/05/2021 2154)   anticoagulant sodium citrate     piperacillin-tazobactam (ZOSYN)  IV 2.25 g (11/07/21 1419)    LOS: 7 days   Raiford Noble, DO [facility] Available via Epic secure chat 7am-7pm After these hours, please refer to coverage provider listed on [email or web address] 11/07/2021, 4:17 PM

## 2021-11-07 NOTE — Progress Notes (Signed)
[facility] Progress Note   Subjective:  pt seen in room, states is "SOB" but doesn't look dyspneic. On RA. BP's 90s / 6s. Afeb.   Objective Vitals:   11/07/21 0044 11/07/21 0330 11/07/21 0907 11/07/21 1134  BP: (!) 93/59 (!) 90/57 (!) 90/58 (!) 95/57  Pulse: 87 87    Resp: '20 16 18   '$ Temp: (!) 97.5 F (36.4 C) 98.4 F (36.9 C) 98.1 F (36.7 C)   TempSrc: Oral Oral Oral   SpO2: 98% 90% 99%   Weight:  80.3 kg    Height:       Physical Exam General: Chronically ill appearing man, NAD. Room air at the moment Heart: RRR; 2/6 murmur Lungs: CTAB; decreased in bases Abdomen: soft Extremities: No LE edema; multiple bilateral leg/foot wounds Dialysis Access: L AVF + thrill  Dialysis Orders:  [place] [facility] MWF 3:45hr 400/500  77 kg 2.0 K/2.5 Ca L AVF  - No heparin - Calcitriol 0.25 mcg PO TIW - Mircera 150 mcg IV q 2 weeks - Venofer 50 mg IV weekly  Assessment/Plan: Sepsis 2/2 Gangrene R great toe: S/P R. posterior tibial artery angioplasty per Dr. Carlis Abbott. Per [facility].   B Fragilis Bacteremia: Remains on course of Zosyn. Acute systolic HF- echo shows ELVEF 25-30%. Cardiology following Volume - no LE edema, CXR w/ bilat effusions which may be layering making xray look worse than it is. 3kg up by wts. Lower vol w/ HD as tolerated.  Pleural effusions - s/p R thoracentesis 11/02/2021 - 1.5L off. Aortic Valve disease-W/U per Cardiology.  ESRD: Usual MWF schedule. Had HD here Thursday and Friday. Next HD Monday.  Anemia: Hgb 9.8 - will give [name] with HD today. Secondary hyperparathyroidism - PO4 at goal. Continue binder, VDRA Hypotension - continue midodrine '10mg'$  TID Nutrition - Very low albumin - likely error on 11/3 labs (prob drawn from line where got IV albumin) - continue protein supplements DMT2-management per primary Prognosis - very low LVEF 25-30 % along w/ ESRD and chronic hypotension. Tolerating his situation better than expected. Poor prognosis  overall. D/w code status w/ pt, he says he wouldn't want life support (aggressive) measures but that "other people would", referring to his family most likely.   Kelly Splinter, MD 11/07/2021, 11:49 AM  Recent Labs  Lab 11/17/2021 0034 11/07/21 0054  HGB 9.8* 11.0*  ALBUMIN 5.5* 2.4*  CALCIUM 7.9* 9.1  PHOS 4.5 4.3  CREATININE 4.58* 4.58*  K 3.3* 3.6    Inpatient medications:  aspirin EC  81 mg Oral Daily   atorvastatin  20 mg Oral Daily   calcitRIOL  0.25 mcg Oral Q M,W,F-HD   calcium acetate  667 mg Oral TID WC   Chlorhexidine Gluconate Cloth  6 each Topical Q0600   Chlorhexidine Gluconate Cloth  6 each Topical Q0600   chlorpheniramine-HYDROcodone  5 mL Oral Once   clopidogrel  75 mg Oral Daily   darbepoetin ([name]) injection - DIALYSIS  100 mcg Subcutaneous Q Fri-1800   feeding supplement (GLUCERNA SHAKE)  237 mL Oral TID BM   ferrous sulfate  325 mg Oral Q breakfast   insulin aspart  0-5 Units Subcutaneous QHS   insulin aspart  0-6 Units Subcutaneous TID WC   insulin glargine-yfgn  8 Units Subcutaneous QHS   midodrine  10 mg Oral TID WC   pantoprazole  40 mg Oral Daily   saccharomyces boulardii  250 mg Oral BID   sodium chloride flush  3 mL Intravenous Q12H  sodium chloride 250 mL (11/27/2021 2154)   anticoagulant sodium citrate     piperacillin-tazobactam (ZOSYN)  IV 2.25 g (11/07/21 0637)   sodium chloride, acetaminophen **OR** acetaminophen, acetaminophen, alteplase, anticoagulant sodium citrate, diphenhydrAMINE, guaiFENesin, heparin, hydrALAZINE, HYDROmorphone (DILAUDID) injection, labetalol, lidocaine (PF), lidocaine-prilocaine, melatonin, ondansetron **OR** ondansetron (ZOFRAN) IV, ondansetron (ZOFRAN) IV, oxyCODONE, pentafluoroprop-tetrafluoroeth, sodium chloride flush

## 2021-11-07 NOTE — Progress Notes (Signed)
Rounding Note    Patient Name: Paul Gonzalez Date of Encounter: 11/07/2021  [place] Cardiologist: Larae Grooms, MD   Subjective   Continues mild shortness of breath.  No chest pain.  Inpatient Medications    Scheduled Meds:  aspirin EC  81 mg Oral Daily   atorvastatin  20 mg Oral Daily   calcitRIOL  0.25 mcg Oral Q M,W,F-HD   calcium acetate  667 mg Oral TID WC   Chlorhexidine Gluconate Cloth  6 each Topical Q0600   Chlorhexidine Gluconate Cloth  6 each Topical Q0600   chlorpheniramine-HYDROcodone  5 mL Oral Once   clopidogrel  75 mg Oral Daily   darbepoetin ([name]) injection - DIALYSIS  100 mcg Subcutaneous Q Fri-1800   feeding supplement (GLUCERNA SHAKE)  237 mL Oral TID BM   ferrous sulfate  325 mg Oral Q breakfast   insulin aspart  0-5 Units Subcutaneous QHS   insulin aspart  0-6 Units Subcutaneous TID WC   insulin glargine-yfgn  8 Units Subcutaneous QHS   midodrine  10 mg Oral TID WC   pantoprazole  40 mg Oral Daily   saccharomyces boulardii  250 mg Oral BID   sodium chloride flush  3 mL Intravenous Q12H   Continuous Infusions:  sodium chloride 250 mL (11/09/2021 2154)   anticoagulant sodium citrate     piperacillin-tazobactam (ZOSYN)  IV 2.25 g (11/07/21 0637)   PRN Meds: sodium chloride, acetaminophen **OR** acetaminophen, acetaminophen, alteplase, anticoagulant sodium citrate, diphenhydrAMINE, guaiFENesin, heparin, hydrALAZINE, HYDROmorphone (DILAUDID) injection, labetalol, lidocaine (PF), lidocaine-prilocaine, melatonin, ondansetron **OR** ondansetron (ZOFRAN) IV, ondansetron (ZOFRAN) IV, oxyCODONE, pentafluoroprop-tetrafluoroeth, sodium chloride flush   Vital Signs    Vitals:   11/09/2021 2020 11/07/21 0044 11/07/21 0330 11/07/21 0907  BP: (!) 88/57 (!) 93/59 (!) 90/57 (!) 90/58  Pulse: 90 87 87   Resp: '20 20 16 18  '$ Temp: (!) 97.4 F (36.3 C) (!) 97.5 F (36.4 C) 98.4 F (36.9 C) 98.1 F (36.7 C)  TempSrc: Oral Oral Oral Oral   SpO2: 98% 98% 90% 99%  Weight:   80.3 kg   Height:        Intake/Output Summary (Last 24 hours) at 11/07/2021 1105 Last data filed at 11/18/2021 1834 Gross per 24 hour  Intake 340 ml  Output 700 ml  Net -360 ml       11/07/2021    3:30 AM 11/28/2021    6:34 PM 12/01/2021    3:15 PM  Last 3 Weights  Weight (lbs) 177 lb 0.5 oz 181 lb 3.5 oz 215 lb 9.8 oz  Weight (kg) 80.3 kg 82.2 kg 97.8 kg      Telemetry    Sinus rhythm-personally reviewed  ECG    None new   Physical Exam   GEN: Well nourished, well developed, in no acute distress  HEENT: normal  Neck: + JVD, carotid bruits, or masses Cardiac: RRR; 2 out of 6 systolic murmur at the base Respiratory:  clear to auscultation bilaterally, normal work of breathing GI: soft, nontender, nondistended, + BS MS: no deformity or atrophy  Skin: right big toe gangrene Neuro:  Strength and sensation are intact Psych: euthymic mood, full affect    Labs    High Sensitivity Troponin:  No results for input(s): "TROPONINIHS" in the last 720 hours.   Chemistry Recent Labs  Lab 11/05/21 0045 11/15/2021 0034 11/07/21 0054  NA 133* 133* 132*  K 4.9 3.3* 3.6  CL 93* 88* 95*  CO2 23 21*  25  GLUCOSE 217* 200* 203*  BUN 62* 33* 27*  CREATININE 7.37* 4.58* 4.58*  CALCIUM 9.4 7.9* 9.1  MG 2.2 1.6* 1.8  PROT 6.1* 8.3* 6.2*  ALBUMIN 2.4* 5.5* 2.4*  AST '27 25 19  '$ ALT '29 23 23  '$ ALKPHOS 106 88 112  BILITOT 1.0 1.3* 1.2  GFRNONAA 8* 14* 14*  ANIONGAP 17* 24* 12     Lipids  Recent Labs  Lab 11/28/2021 0034  CHOL 63  TRIG 90  HDL 16*  LDLCALC 29  CHOLHDL 3.9     Hematology Recent Labs  Lab 11/05/21 1058 11/04/2021 0034 11/07/21 0054  WBC 16.6* 15.0* 18.0*  RBC 3.82* 3.44* 3.85*  HGB 10.7* 9.8* 11.0*  HCT 34.0* 31.0* 33.7*  MCV 89.0 90.1 87.5  MCH 28.0 28.5 28.6  MCHC 31.5 31.6 32.6  RDW 20.8* 20.9* 21.1*  PLT 161 152 186    Thyroid No results for input(s): "TSH", "FREET4" in the last 168 hours.  BNP Recent  Labs  Lab 10/30/2021 1553  BNP >4,500.0*     DDimer No results for input(s): "DDIMER" in the last 168 hours.   Radiology    DG CHEST PORT 1 VIEW  Result Date: 11/07/2021 CLINICAL DATA:  Shortness of breath. EXAM: PORTABLE CHEST 1 VIEW COMPARISON:  11/13/2021 FINDINGS: The heart size and mediastinal contours are within normal limits. Aortic atherosclerotic calcification incidentally noted. Low lung volumes are again demonstrated with bibasilar atelectasis. Small bilateral pleural effusions also unchanged. Persistent symmetric bilateral perihilar opacities are seen with a somewhat bat wing appearance, suspicious for pulmonary edema. IMPRESSION: No significant change in low lung volumes, bibasilar atelectasis, and small bilateral pleural effusions. Central mild pulmonary edema also suspected. Electronically Signed   By: Marlaine Hind M.D.   On: 11/07/2021 09:03   DG CHEST PORT 1 VIEW  Result Date: 11/20/2021 CLINICAL DATA:  56 year old male End stage renal disease. Shortness of breath. Bilateral pleural effusions status post right side thoracentesis 11/02/2021. EXAM: PORTABLE CHEST 1 VIEW COMPARISON:  Portable chest 11/05/2021 and earlier, including recent chest CT 10/17/2021. FINDINGS: Portable AP semi upright view at 0527 hours. Stable lung volumes. Normal cardiac size and mediastinal contours. Visualized tracheal air column is within normal limits. Bilateral veiling pulmonary opacity has not significantly changed from yesterday. Right pleural effusion remains diminished compared to the CT 10/10/2021. Stable pulmonary vascularity, no overt edema. No air bronchograms or pneumothorax. Chronic right 8th rib fracture. No osseous abnormality identified. Paucity of bowel gas in the upper abdomen. IMPRESSION: Stable residual bilateral pleural effusions. No new cardiopulmonary abnormality. Electronically Signed   By: Genevie Ann M.D.   On: 11/25/2021 07:18    Cardiac Studies   Echocardiogram  11/01/2021: Impressions:  1. Left ventricular ejection fraction, by estimation, is 25 to 30%. The  left ventricle has severely decreased function. The left ventricle  demonstrates global hypokinesis. Left ventricular diastolic parameters are  consistent with Grade III diastolic  dysfunction (restrictive).   2. Right ventricular systolic function is mildly reduced. The right  ventricular size is severely enlarged. There is moderately elevated  pulmonary artery systolic pressure. The estimated right ventricular  systolic pressure is 53.2 mmHg.   3. Left atrial size was severely dilated.   4. Right atrial size was mildly dilated.   5. A small pericardial effusion is present. The pericardial effusion is  circumferential.   6. The mitral valve is abnormal. Mild mitral valve regurgitation. Mild  mitral stenosis. Moderate mitral annular calcification.   7. The  aortic valve is calcified. Aortic valve regurgitation is moderate  to severe. Moderate to severe aortic valve stenosis. GRadient of 36 mm Hg  at max despite decreased strok volume index. DVI 0.20.   Comparison(s): Significant changes from prior. Atempting to reach primary  team.    Patient Profile     56 y.o. male with a history of moderate aortic stenosis noted on Echo in 03/2021, hypertension, hyperlipidemia, type 2 diabetes mellitus, and ESRD on hemodialysis who was admitted to [facility] on 10/21/2021 for sepsis secondary to gangrenous wound of right great toe with presumed superimposed cellulitis. Blood cultures were positive for Bacteroides fragilis. Echo at [facility] showed newly reduced EF of 25-30% for which Cardiology was consulted. He was transferred to [facility] for possible amputation of gangrenous foot.  Assessment & Plan    Acute combined systolic and diastolic heart failure: Was found to have a pleural effusion.  BNP at 4500, though on dialysis.  Thoracentesis with 1.5 L negative.  Not on goal-directed medical  therapy due to hypotension requiring midodrine and on dialysis.  Per Dr. Domenic Polite, no plan for invasive work-up at this time given bacteremia.  Aortic stenosis/regurgitation: Was planned for TEE during this admission, but canceled due to hypotension.  Rescheduled for Monday.  Hypotension: Currently on midodrine  ESRD: Dialysis Monday Wednesday Friday.  Remains volume overloaded and could have extra fluid removed at next dialysis session.  Sepsis secondary to gangrenous right toe with cellulitis: Currently on Zosyn.   For questions or updates, please contact [place] Please consult [email or web address] for contact info under      Signed, Paytience Bures Meredith Leeds, MD  11/07/2021, 11:05 AM

## 2021-11-08 ENCOUNTER — Inpatient Hospital Stay (HOSPITAL_COMMUNITY): Payer: Medicare Other

## 2021-11-08 DIAGNOSIS — J90 Pleural effusion, not elsewhere classified: Secondary | ICD-10-CM | POA: Diagnosis not present

## 2021-11-08 DIAGNOSIS — E1122 Type 2 diabetes mellitus with diabetic chronic kidney disease: Secondary | ICD-10-CM

## 2021-11-08 DIAGNOSIS — I10 Essential (primary) hypertension: Secondary | ICD-10-CM | POA: Diagnosis not present

## 2021-11-08 DIAGNOSIS — R7989 Other specified abnormal findings of blood chemistry: Secondary | ICD-10-CM | POA: Diagnosis not present

## 2021-11-08 DIAGNOSIS — N186 End stage renal disease: Secondary | ICD-10-CM | POA: Diagnosis not present

## 2021-11-08 DIAGNOSIS — E1152 Type 2 diabetes mellitus with diabetic peripheral angiopathy with gangrene: Secondary | ICD-10-CM

## 2021-11-08 DIAGNOSIS — E11628 Type 2 diabetes mellitus with other skin complications: Secondary | ICD-10-CM | POA: Diagnosis not present

## 2021-11-08 DIAGNOSIS — L03119 Cellulitis of unspecified part of limb: Secondary | ICD-10-CM | POA: Diagnosis not present

## 2021-11-08 LAB — CBC WITH DIFFERENTIAL/PLATELET
Abs Immature Granulocytes: 0.13 10*3/uL — ABNORMAL HIGH (ref 0.00–0.07)
Basophils Absolute: 0.1 10*3/uL (ref 0.0–0.1)
Basophils Relative: 1 %
Eosinophils Absolute: 0.4 10*3/uL (ref 0.0–0.5)
Eosinophils Relative: 2 %
HCT: 33.2 % — ABNORMAL LOW (ref 39.0–52.0)
Hemoglobin: 10.8 g/dL — ABNORMAL LOW (ref 13.0–17.0)
Immature Granulocytes: 1 %
Lymphocytes Relative: 3 %
Lymphs Abs: 0.5 10*3/uL — ABNORMAL LOW (ref 0.7–4.0)
MCH: 28.7 pg (ref 26.0–34.0)
MCHC: 32.5 g/dL (ref 30.0–36.0)
MCV: 88.3 fL (ref 80.0–100.0)
Monocytes Absolute: 1 10*3/uL (ref 0.1–1.0)
Monocytes Relative: 6 %
Neutro Abs: 14.3 10*3/uL — ABNORMAL HIGH (ref 1.7–7.7)
Neutrophils Relative %: 87 %
Platelets: 196 10*3/uL (ref 150–400)
RBC: 3.76 MIL/uL — ABNORMAL LOW (ref 4.22–5.81)
RDW: 21.2 % — ABNORMAL HIGH (ref 11.5–15.5)
WBC: 16.4 10*3/uL — ABNORMAL HIGH (ref 4.0–10.5)
nRBC: 0 % (ref 0.0–0.2)

## 2021-11-08 LAB — COMPREHENSIVE METABOLIC PANEL
ALT: 21 U/L (ref 0–44)
AST: 18 U/L (ref 15–41)
Albumin: 2.2 g/dL — ABNORMAL LOW (ref 3.5–5.0)
Alkaline Phosphatase: 114 U/L (ref 38–126)
Anion gap: 17 — ABNORMAL HIGH (ref 5–15)
BUN: 38 mg/dL — ABNORMAL HIGH (ref 6–20)
CO2: 24 mmol/L (ref 22–32)
Calcium: 9.1 mg/dL (ref 8.9–10.3)
Chloride: 87 mmol/L — ABNORMAL LOW (ref 98–111)
Creatinine, Ser: 5.57 mg/dL — ABNORMAL HIGH (ref 0.61–1.24)
GFR, Estimated: 11 mL/min — ABNORMAL LOW (ref >60)
Glucose, Bld: 215 mg/dL — ABNORMAL HIGH (ref 70–99)
Potassium: 3.9 mmol/L (ref 3.5–5.1)
Sodium: 128 mmol/L — ABNORMAL LOW (ref 135–145)
Total Bilirubin: 1.2 mg/dL (ref 0.3–1.2)
Total Protein: 6 g/dL — ABNORMAL LOW (ref 6.5–8.1)

## 2021-11-08 LAB — MAGNESIUM: Magnesium: 1.8 mg/dL (ref 1.7–2.4)

## 2021-11-08 LAB — GLUCOSE, CAPILLARY
Glucose-Capillary: 236 mg/dL — ABNORMAL HIGH (ref 70–99)
Glucose-Capillary: 153 mg/dL — ABNORMAL HIGH (ref 70–99)
Glucose-Capillary: 182 mg/dL — ABNORMAL HIGH (ref 70–99)
Glucose-Capillary: 173 mg/dL — ABNORMAL HIGH (ref 70–99)

## 2021-11-08 LAB — PHOSPHORUS: Phosphorus: 5.5 mg/dL — ABNORMAL HIGH (ref 2.5–4.6)

## 2021-11-08 MED ORDER — SODIUM CHLORIDE 0.9 % IV SOLN
1.5000 g | Freq: Two times a day (BID) | INTRAVENOUS | Status: DC
  Administered 2021-11-08 – 2021-11-11 (×6): 1.5 g via INTRAVENOUS
  Filled 2021-11-08 (×8): qty 4

## 2021-11-08 MED ORDER — CHLORHEXIDINE GLUCONATE CLOTH 2 % EX PADS
6.0000 | MEDICATED_PAD | Freq: Every day | CUTANEOUS | Status: DC
  Administered 2021-11-11: 6 via TOPICAL

## 2021-11-08 NOTE — Progress Notes (Signed)
   11/08/21 1514 11/08/21 1545  Vitals  Temp  --  98.1 F (36.7 C)  Temp Source  --  Oral  BP  --  (!) 97/59  MAP (mmHg)  --  70  Pulse Rate  --  87  Pulse Rate Source  --  Monitor  ECG Heart Rate  --  86  Resp  --  17  MEWS COLOR  MEWS Score Color  --  Green  Orthostatic Lying   BP- Lying (!) 86/59  --   Pulse- Lying 89  --   Orthostatic Sitting  BP- Sitting (!) 87/73  --   Pulse- Sitting 90  --   Orthostatic Standing at 0 minutes  BP- Standing at 0 minutes (!) 88/56  --   Pulse- Standing at 0 minutes 91  --   Oxygen Therapy  SpO2  --  93 %  MEWS Score  MEWS Temp  --  0  MEWS Systolic  --  1  MEWS Pulse  --  0  MEWS RR  --  0  MEWS LOC  --  0  MEWS Score  --  1   MD aware face-face and Midodrine was given as scheduled

## 2021-11-08 NOTE — Progress Notes (Addendum)
Mobility Specialist Progress Note    11/08/21 1514  Orthostatic Lying   BP- Lying (!) 86/59  Pulse- Lying 89  Orthostatic Sitting  BP- Sitting (!) 87/73  Pulse- Sitting 90  Orthostatic Standing at 0 minutes  BP- Standing at 0 minutes (!) 88/56  Pulse- Standing at 0 minutes 91  Mobility  Activity Ambulated with assistance to bathroom;Ambulated with assistance in hallway  Level of Assistance Minimal assist, patient does 75% or more  Assistive Device Front wheel walker  Distance Ambulated (ft) 120 ft (10+110)  Activity Response Tolerated well  Mobility Referral Yes  $Mobility charge 1 Mobility   Pre-Mobility: 88 HR During Mobility: 93 HR Post-Mobility: 95 HR, 76/49 (59) BP, 95% SpO2  Pt received in bed and agreeable. On RA. After first standing BP, pt requesting to go to BR for BM. C/o feeling SOB upon return to room and placed back on room O2. Left in bed with call bell in reach. RN notified.   Hildred Alamin Mobility Specialist  Secure Chat Only
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
## 2021-11-08 NOTE — Significant Event (Signed)
Pt currently in pain rating at 10on scale of 0-10. Blood pressure low gave midodrine. PRN Hydromorphone  for pain.

## 2021-11-08 NOTE — Progress Notes (Signed)
Pharmacy Antibiotic Note  Paul Gonzalez is a 56 y.o. male admitted on 10/21/2021 with B fragilis bacteremia/cellulitis. Per ID, narrowing therapy from Zosyn>>Unasyn. Pharmacy has been consulted for Unasyn dosing. Pt ESRD on HD MWF.  WBC 16.4 (decreased from 18 yesterday), afebrile.  Plan: Discontinue Zosyn  Start Unasyn 1.5g q12hrs Monitor labs, c/s, and patient improvement F/u plans with ortho regarding potential amputation   Height: '5\' 9"'$  (175.3 cm) Weight: 83.1 kg (183 lb 3.2 oz) IBW/kg (Calculated) : 70.7  Temp (24hrs), Avg:97.7 F (36.5 C), Min:97.4 F (36.3 C), Max:98.1 F (36.7 C)  Recent Labs  Lab 11/04/21 0053 11/05/21 0045 11/05/21 1058 11/27/2021 0034 11/07/21 0054 11/08/21 0054  WBC 13.0* 16.6* 16.6* 15.0* 18.0* 16.4*  CREATININE 4.60* 7.37*  --  4.58* 4.58* 5.57*     Estimated Creatinine Clearance: 14.8 mL/min (A) (by C-G formula based on SCr of 5.57 mg/dL (H)).    No Known Allergies  Antimicrobials this admission: Zosyn 10/30 >> 11/5 Vanco/Cefepime 10/28 >> 10/30 CTX/azith 10/28 Unasyn 11/5 >>   Microbiology results: 10/28 blood>> bacteroides fragilis   BCID: bacteroides fragilis   Paul Gonzalez, PharmD PGY1 Pharmacy Resident 11/5/20239:10 AM

## 2021-11-08 NOTE — Significant Event (Signed)
Pt experiencing vomiting and itching gave zofran IV.  Pain 7 after pain meds given

## 2021-11-08 NOTE — Progress Notes (Signed)
  Progress Note    11/08/2021 1:09 PM 2 Days Post-Op  Subjective:  no complaints this morning   Vitals:   11/08/21 0439 11/08/21 1147  BP: (!) 91/56 (!) 89/62  Pulse: 87 87  Resp: 18 18  Temp: 97.7 F (36.5 C) (!) 97.4 F (36.3 C)  SpO2: 95% 94%   Physical Exam: Lungs:  non labored Incisions:  L groin cath site without hematoma Extremities:  Feet warm to touch with good cap refill; wounds dry, open to air Neurologic: a&O  CBC    Component Value Date/Time   WBC 16.4 (H) 11/08/2021 0054   RBC 3.76 (L) 11/08/2021 0054   HGB 10.8 (L) 11/08/2021 0054   HCT 33.2 (L) 11/08/2021 0054   PLT 196 11/08/2021 0054   MCV 88.3 11/08/2021 0054   MCH 28.7 11/08/2021 0054   MCHC 32.5 11/08/2021 0054   RDW 21.2 (H) 11/08/2021 0054   LYMPHSABS 0.5 (L) 11/08/2021 0054   MONOABS 1.0 11/08/2021 0054   EOSABS 0.4 11/08/2021 0054   BASOSABS 0.1 11/08/2021 0054    BMET    Component Value Date/Time   NA 128 (L) 11/08/2021 0054   NA 147 (H) 06/10/2016 0840   K 3.9 11/08/2021 0054   CL 87 (L) 11/08/2021 0054   CO2 24 11/08/2021 0054   GLUCOSE 215 (H) 11/08/2021 0054   BUN 38 (H) 11/08/2021 0054   BUN 20 06/10/2016 0840   CREATININE 5.57 (H) 11/08/2021 0054   CALCIUM 9.1 11/08/2021 0054   GFRNONAA 11 (L) 11/08/2021 0054   GFRAA 25 (L) 06/10/2016 0840    INR    Component Value Date/Time   INR 1.6 (H) 10/11/2021 1553     Intake/Output Summary (Last 24 hours) at 11/08/2021 1309 Last data filed at 11/08/2021 0916 Gross per 24 hour  Intake 283 ml  Output 0 ml  Net 283 ml      Assessment/Plan:  56 y.o. male is s/p R SFA and PTA angioplasty  2 Days Post-Op   Ortho intervention pending improved blood pressure.  L groin cath site without hematoma After intervention, he now has a patent R PT onto the foot.  He is also optimized from our standpoint with peroneal only runoff of the LLE Continue aspirin, plavix, statin daily regimen Office will arrange RLE arterial duplex and  [name] in 4-6 weeks  Broadus John MD [facility] [phone] 11/08/2021 1:09 PM

## 2021-11-08 NOTE — Progress Notes (Signed)
PROGRESS NOTE    Paul Gonzalez  FVC:[id number] DOB: 08-Oct-1965 DOA: 11/01/2021 PCP: Coral Spikes, DO   Brief Narrative:  56 y.o. male with medical history significant of Hypertension, hyperlipidemia, ESRD on HD (MWF), anemia of chronic disease, diabetic retinopathy with vision loss who presents to the emergency department due to worsening shortness of breath that has been ongoing for about 2 weeks, this was associated with nonproductive cough.  He  also complained of bilateral diabetic foot ulcers and states that he has not been able to get an appointment with a podiatrist.  He denies chest pain, fever, chills, nausea, vomiting, diarrhea or constipation.   ED Course:  In the emergency department, he was intermittently tachypneic, BP was soft at 105/52, other vital signs were within normal range.  Work-up in the ED showed leukocytosis, normocytic anemia, BMP showed sodium 135, potassium 4.9, chloride 93, bicarb 25, glucose 173, BUN 45, creatinine 5.14, albumin 3.3, AST 57, ALT 47, total bilirubin 1.6.  Anion gap 17, lactic acid 2.4 > 2.8, BNP > 4,500.  Influenza A, B, SARS coronavirus 2 was negative.   Right foot x-ray showed no fracture or dislocation of the right foot.  No radiographic findings of osteomyelitis.  Soft tissue wound over the medial great toe.  Soft tissue edema of the forefoot.  Chest x-ray showed cardiomegaly without failure.  Bilateral pleural effusion and associated airspace disease, likely atelectasis, right greater than left.  He was treated with IV ceftriaxone, vancomycin and azithromycin.  Midodrine was given.  IV hydration was provided.  Hospitalist was asked to admit patient for further evaluation and management.  Orthopedic surgeon (Dr. Erlinda Hong) was consulted and patient will be seen when he arrives at [facility].    **Interim History Orthopedic surgery evaluated and recommended ABIs and also recommended vascular evaluation.  Vascular surgery is now evaluating and plan is for  aortogram with lower extremity arteriogram.  Nephrology is also been consulted and plan is for dialysis later today.   11/27/2021.  Patient was supposed to go for a TEE today given his bacteremia but this was canceled due to his hypotension.  Vascular surgery feels that he is optimized from the left lower extremity standpoint as well as he has inline flow with only single-vessel peroneal runoff.  They are recommending discussing aspirin and Plavix as well as statin daily and recommending following up in a month.  No allergies taking the patient back to dialysis again today and he remains on IV Zosyn.  May need to broaden his antibiotics given that his WBC remains elevated but slowly trending down.   11/07/2021: Patient's WBC is trending back up again and now have asked ID to weigh in given his current condition.  Patient continues to complain of some shortness breath and the TEE is being rescheduled for Monday, 11/09/2021.  Nephrology recommending next dialysis session on Monday and has chest x-ray showing bilateral effusions and may be laying which makes the x-ray look worse.  Has a poor prognosis overall.  We will need to follow-up on orthopedic surgery evaluation and recommendations as well.  11/08/2021: Orthopedic surgery recommends no further intervention at this point in time and recommends discharging home and letting the foot declare itself for outpatient elective surgical intervention.  Patient is to go for TEE tomorrow.  He remains hypotensive and a little drowsy today.  We will need to continue dialysis and watch him carefully.  ID recommending at least 2 weeks of antibiotics for the bacteremia and today have  narrowed from Zosyn to Unasyn  Assessment and Plan: No notes have been filed under this hospital service. Service: Hospitalist  Sepsis secondary to B Fragilis Bacteremia and concomitant Gangrene Right Toe with Cellulitis -Patient will likely require amputation of gangrenous tissue but Ortho  feels does not need to be inpatient given that he has been optimized  -Continue antibiotics for B fragilis coverage with IV zosyn and changed to IV Unasyn by ID -Aortogram with LE arteriorgram done  -[name]'s ordered and showed and reveals [place] vessels on the right with biphasic waveforms and dampened monophasic waveforms on the left.  -Patient undergoing an aortogram with lower extremity arteriogram done given that there is tissue loss and he had a right posterior tibial artery angioplasty and right mid to distal SFV drug-coated balloon angioplasty and has been optimized in the right lower extremity after SFA and posterior tibial intervention with inline flow down the right lower extremity with via the posterior tibial; patient was loaded with Plavix and aspirin and statin and they feel that if the right foot wounds fail to heal he would be a candidate for retrograde right AT access -WBC has gone from 13.0 -> 16.6 x2 -> 15.0 -> 18.0 -> 16.4  -Orthopedic Surgery consulted and will follow up after Vascular Surgery evaluation -Vascular feels that he has been optimized from their standpoint -Awaiting Ortho recc's and recommending the patient to be D/C'd home with the foot to declare itself with outpatient Amputation    New finding of Cardiomyopathy  Acute Systolic CHF/HFrEF Volume overload -Fluid management with hemodialysis  -Midodrine dose increased due to soft BPs limiting volume removal for HD -Echo with findings of change in EF now down to 20-25% with severely decreased function of the left ventricle demonstrates global hypokinesis and left ventricular diastolic parameters consistent with grade 3 diastolic dysfunction and restricted as well as the right ventricular systolic function is mildly reduced; patient's aortic valve regurgitation is moderate to severe and moderate to severe aortic valve stenosis was noted with a gradient of 36 mmHg metrics despite decrease stroke-volume index -BNP was >4,500 and  he remains volume overloaded  -Inpatient cardiology consultation requested -Pt may require TEE to better assess for valvular disease and cardiology recommending and planning for this later this week -Appreciate cardiology team recs; they will follow him at [facility]  -Volume maintenance is via dialysis and he underwent thoracentesis of the right pleural effusion on 11/02/2021 with removal 1.5 L of fluid. -He is not on any GDMT due to hypotension requiring midodrine and ESRD -Cardiology is unclear whether cardiomyopathy secondary to progressive aortic valve disease but they would not pursue any invasive work-up at this time given his acute bacteremia; TEE canceled and rescheduled for Monday 11/09/21 and pending to be done -Getting volume maintenance by dialysis if tolerated   B Fragilis Bacteremia -My colleague discussed with Pharmacist and the patient waschanged to IV Zosyn for better coverage -Patient remains on antibiotic coverage with IV Zosyn and WBC is 16.4 today -Cardiology recommending TEE given his bacteremia and has echo that was different from the one done in March and there unable to perform it at the same time as a peripheral arteriogram so they are planning for this on Friday, 11/25/2021 however this had to be canceled due to his hypotension -Will consult ID for further evaluation and recommendations and they have de-escalated IV Zosyn to IV Unasyn    ESRD on HD Elevated anion gap metabolic Acidosis -He is on schedule with MWF HD treatments -  Nephrology was consulted regarding inpatient HD treatments -Electrolytes are stable at this time and Na+ improved from 132 -> 137 -> 132 but K+ is now 3.6 -Patient has a slight metabolic acidosis with a CO2 of 21, anion gap of 24, chloride level of 88 -Patient's BUN/Cr went from 63/6.21 -> 40/5.04 -> 39/4.60 -> 62/7.37 -> 33/4.58 -> 27/4.58 -> 38/5.57 -Nephrology team has been consulted and plan is for Dialysis again on Monday     Hypokalemia Hypomagnesemia Hyponatremia -As above -Patient's K+ is now 3.9 and magnesium level is now 1.8 -Na+ is now 128 and likley 2/2 to Hypervolemia  -Continue to Monitor and Trend and likely to be repleted in dialysis -Repeat CMP in the AM    Lactic Acidosis -Last Lactate down to 2.1 now   Bilateral Pleural Effusions R>L -Thoracentesis ordered for large right effusion, completed 10/30 with 1.5L removed -Continue supportive care and volume management with HD for now -Not currently with any symptoms of respiratory distress -Manage as per dialysis and may need a repeat thoracentesis -CXR this AM done and showed "Increasing central vascular congestion. Bilateral pleural effusions which appear worsened from the prior study but may be in part due to patient positioning."   Leukocytosis  -Continue to monitor CBC/diff as response to treating infection -Follow-up blood cultures -Patient's WBC went from 15.8 -> 12.9 -> 13.0 -> 16.6 x2 -> 15.0 -> 18.0 -> 16.4 -Continue to Monitor and Trend and repeat CBC in the AM  -We will get infectious disease involvement given his continued elevation in WBC   Diabetes mellitus with neurological, renal, vascular complications -Continue very sensitive NovoLog sliding scale before meals and at bedtime SSI coverage, daily lantus 8 units for basal coverage, frequent CBG monitoring -Continue monitor CBGs per protocol and CBGs ranging from 153-236   Chronic Hypotension  -Had resumed Midodrine, dose increased by nephrology due to soft BPs limiting HD volume removal and also prevented him from getting a TEE -Continue to monitor blood pressures per protocol -Last blood pressure reading was 97/59   Normocytic Anemia -Patient's Hgb/Hct went from 11.2/35.2 -> 8.6/27.1 -> 10.9/34.7 -> 10.7/34.0 -> 9.8/31.0 -> 11.0/33.7 -Check Anemia Panel in the AM  -Continue to Monitor for S/Sx of Bleeding; No overt bleeding noted -Repeat CBC in the AM     Thrombocytopenia -Patient's Plaletet Count went from Platelet went from 126 -> 119 -> 150 -> 161 -> 152 -> 186 -> 196 -Continue to Monitor for S/Sx of Bleeding; No overt bleeding noted -Repeat CBC in the AM    Hypoalbuminemia -Patient's Albumin Level is now 2.2 -Continue to Monitor and Trend -Repeat CMP in the AM    Hyponatremia -Patient's sodium is now 132 again -Likely to be repleted in dialysis and corrected there -Continue monitor and trend and repeat CMP in a.m. -Repeat CMP in the AM    DVT prophylaxis: SCDs Start: 10/20/2021 2114    Code Status: Full Code Family Communication: No family present at bedside   Disposition Plan:  Level of care: Telemetry Medical Status is: Inpatient Remains inpatient appropriate because: Continues to have significant pain in his foot and needs a TEE tomorrow.  Blood pressure remains on lower side and needs dialysis   Consultants:  Nephrology Cardiology Orthopedic Surgery Vascular Surgery Infectious Diseases   Procedures:  As delineated as above   ECHOCARDIOGRAM IMPRESSIONS     1. Left ventricular ejection fraction, by estimation, is 25 to 30%. The  left ventricle has severely decreased function. The left ventricle  demonstrates global hypokinesis. Left ventricular diastolic parameters are  consistent with Grade III diastolic  dysfunction (restrictive).   2. Right ventricular systolic function is mildly reduced. The right  ventricular size is severely enlarged. There is moderately elevated  pulmonary artery systolic pressure. The estimated right ventricular  systolic pressure is 16.1 mmHg.   3. Left atrial size was severely dilated.   4. Right atrial size was mildly dilated.   5. A small pericardial effusion is present. The pericardial effusion is  circumferential.   6. The mitral valve is abnormal. Mild mitral valve regurgitation. Mild  mitral stenosis. Moderate mitral annular calcification.   7. The aortic valve is calcified.  Aortic valve regurgitation is moderate  to severe. Moderate to severe aortic valve stenosis. GRadient of 36 mm Hg  at max despite decreased strok volume index. DVI 0.20.   Comparison(s): Significant changes from prior. Atempting to reach primary  team.   FINDINGS   Left Ventricle: Left ventricular ejection fraction, by estimation, is 25  to 30%. The left ventricle has severely decreased function. The left  ventricle demonstrates global hypokinesis. The left ventricular internal  cavity size was normal in size. There  is no left ventricular hypertrophy. Left ventricular diastolic parameters  are consistent with Grade III diastolic dysfunction (restrictive).   Right Ventricle: The right ventricular size is severely enlarged. No  increase in right ventricular wall thickness. Right ventricular systolic  function is mildly reduced. There is moderately elevated pulmonary artery  systolic pressure. The tricuspid  regurgitant velocity is 2.82 m/s, and with an assumed right atrial  pressure of 15 mmHg, the estimated right ventricular systolic pressure is  09.6 mmHg.   Left Atrium: Left atrial size was severely dilated.   Right Atrium: Right atrial size was mildly dilated.   Pericardium: A small pericardial effusion is present. The pericardial  effusion is circumferential. Presence of epicardial fat layer.   Mitral Valve: The mitral valve is abnormal. Moderate mitral annular  calcification. Mild mitral valve regurgitation. Mild mitral valve  stenosis.   Tricuspid Valve: The tricuspid valve is normal in structure. Tricuspid  valve regurgitation is mild . No evidence of tricuspid stenosis.   Aortic Valve: The aortic valve is calcified. Aortic valve regurgitation is  moderate to severe. Aortic regurgitation PHT measures 160 msec. Moderate  to severe aortic stenosis is present. Aortic valve mean gradient measures  36.0 mmHg. Aortic valve peak  gradient measures 59.9 mmHg. Aortic valve  area, by VTI measures 0.54 cm.   Pulmonic Valve: The pulmonic valve was grossly normal. Pulmonic valve  regurgitation is trivial. No evidence of pulmonic stenosis.   Aorta: The aortic root is normal in size and structure.   IAS/Shunts: No atrial level shunt detected by color flow Doppler.     LEFT VENTRICLE  PLAX 2D  LVIDd:         5.50 cm  LVIDs:         4.70 cm  LV PW:         1.10 cm  LV IVS:        1.00 cm  LVOT diam:     1.90 cm  LV SV:         46  LV SV Index:   24  LVOT Area:     2.84 cm    LV Volumes (MOD)  LV vol d, MOD A2C: 177.0 ml  LV vol d, MOD A4C: 211.0 ml  LV vol s, MOD A2C: 116.0 ml  LV  vol s, MOD A4C: 133.0 ml  LV SV MOD A2C:     61.0 ml  LV SV MOD A4C:     211.0 ml  LV SV MOD BP:      73.3 ml   RIGHT VENTRICLE  RV S prime:     7.62 cm/s  TAPSE (M-mode): 1.5 cm   LEFT ATRIUM              Index        RIGHT ATRIUM           Index  LA diam:        5.30 cm  2.75 cm/m   RA Area:     21.40 cm  LA Vol (A2C):   148.0 ml 76.76 ml/m  RA Volume:   71.10 ml  36.88 ml/m  LA Vol (A4C):   143.0 ml 74.17 ml/m  LA Biplane Vol: 148.0 ml 76.76 ml/m   AORTIC VALVE  AV Area (Vmax):    0.55 cm  AV Area (Vmean):   0.64 cm  AV Area (VTI):     0.54 cm  AV Vmax:           387.00 cm/s  AV Vmean:          232.500 cm/s  AV VTI:            0.863 m  AV Peak Grad:      59.9 mmHg  AV Mean Grad:      36.0 mmHg  LVOT Vmax:         75.70 cm/s  LVOT Vmean:        52.500 cm/s  LVOT VTI:          0.164 m  LVOT/AV VTI ratio: 0.19  AI PHT:            160 msec    AORTA  Ao Root diam: 3.10 cm   MITRAL VALVE                TRICUSPID VALVE  MV Area (PHT): 6.17 cm     TR Peak grad:   31.8 mmHg  MV Decel Time: 123 msec     TR Vmax:        282.00 cm/s  MR Peak grad: 81.0 mmHg  MR Mean grad: 49.0 mmHg     SHUNTS  MR Vmax:      450.00 cm/s   Systemic VTI:  0.16 m  MR Vmean:     322.0 cm/s    Systemic Diam: 1.90 cm  MV E velocity: 114.00 cm/s  MV A velocity: 34.70 cm/s   MV E/A ratio:  3.29    Procedure Performed: 1.  Ultrasound-guided access left common femoral artery 2.  Aortogram with catheter selection of aorta 3.  Bilateral lower extremity arteriogram with selection of third order branches in the right lower extremity 4.  Right posterior tibial artery angioplasty (2.5 mm x 150 mm [place]) 5.  Right mid to distal SFA drug-coated balloon angioplasty (5 mm x 150 mm drug-coated impact) 6.  94 minutes of monitored moderate conscious sedation time 7.  Mynx closure of the left common femoral artery  Antimicrobials:  Anti-infectives (From admission, onward)    Start     Dose/Rate Route Frequency Ordered Stop   11/08/21 1500  ampicillin-sulbactam (UNASYN) 1.5 g in sodium chloride 0.9 % 100 mL IVPB        1.5 g 200 mL/hr over 30 Minutes Intravenous Every 12 hours 11/08/21  3710     11/04/21 2300  piperacillin-tazobactam (ZOSYN) IVPB 2.25 g  Status:  Discontinued        2.25 g 100 mL/hr over 30 Minutes Intravenous Every 8 hours 11/03/21 2305 11/04/21 0004   11/04/21 0015  piperacillin-tazobactam (ZOSYN) IVPB 2.25 g  Status:  Discontinued        2.25 g 100 mL/hr over 30 Minutes Intravenous Every 8 hours 11/04/21 0005 11/08/21 0852   11/02/21 1800  ceFEPIme (MAXIPIME) 2 g in sodium chloride 0.9 % 100 mL IVPB  Status:  Discontinued        2 g 200 mL/hr over 30 Minutes Intravenous Every M-W-F (1800) 10/26/2021 2119 11/02/21 0947   11/02/21 1200  vancomycin (VANCOREADY) IVPB 750 mg/150 mL  Status:  Discontinued        750 mg 150 mL/hr over 60 Minutes Intravenous Every M-W-F (Hemodialysis) 10/07/2021 2119 11/02/21 0948   11/02/21 1200  piperacillin-tazobactam (ZOSYN) IVPB 2.25 g  Status:  Discontinued        2.25 g 100 mL/hr over 30 Minutes Intravenous Every 8 hours 11/02/21 0952 11/02/21 1014   11/02/21 1200  piperacillin-tazobactam (ZOSYN) 2.25 g in sodium chloride 0.9 % 50 mL IVPB  Status:  Discontinued        2.25 g 100 mL/hr over 30 Minutes Intravenous  Every 8 hours 11/02/21 1014 11/03/21 2305   10/04/2021 2130  vancomycin (VANCOREADY) IVPB 500 mg/100 mL        500 mg 100 mL/hr over 60 Minutes Intravenous  Once 10/08/2021 2119 11/01/21 0152   10/29/2021 2100  vancomycin (VANCOREADY) IVPB 1500 mg/300 mL  Status:  Discontinued        1,500 mg 150 mL/hr over 120 Minutes Intravenous  Once 10/23/2021 2057 10/08/2021 2119   10/10/2021 2100  ceFEPIme (MAXIPIME) 2 g in sodium chloride 0.9 % 100 mL IVPB        2 g 200 mL/hr over 30 Minutes Intravenous  Once 10/19/2021 2057 11/01/21 0152   10/05/2021 1630  vancomycin (VANCOCIN) IVPB 1000 mg/200 mL premix        1,000 mg 200 mL/hr over 60 Minutes Intravenous  Once 10/20/2021 1628 11/01/2021 1818   10/13/2021 1530  cefTRIAXone (ROCEPHIN) 2 g in sodium chloride 0.9 % 100 mL IVPB  Status:  Discontinued        2 g 200 mL/hr over 30 Minutes Intravenous Every 24 hours 10/04/2021 1522 10/12/2021 2119   10/24/2021 1530  azithromycin (ZITHROMAX) 500 mg in sodium chloride 0.9 % 250 mL IVPB  Status:  Discontinued        500 mg 250 mL/hr over 60 Minutes Intravenous Every 24 hours 10/13/2021 1522 11/02/21 0947       Subjective: Seen and examined at bedside and he was continued to have some foot pain today and asking for pain medication.  Was a little drowsy and also asking for cough medicine again.  Plan is for TEE tomorrow.  ID was consulted and changed antibiotics to IV Unasyn.  He denies any other concerns or complaints this time.  Objective: Vitals:   11/08/21 1147 11/08/21 1454 11/08/21 1455 11/08/21 1545  BP: (!) 89/62  (!) 88/56 (!) 97/59  Pulse: 87 90  87  Resp: 18   17  Temp: (!) 97.4 F (36.3 C)   98.1 F (36.7 C)  TempSrc: Oral   Oral  SpO2: 94%   93%  Weight:      Height:        Intake/Output  Summary (Last 24 hours) at 11/08/2021 1819 Last data filed at 11/08/2021 1600 Gross per 24 hour  Intake 703 ml  Output 0 ml  Net 703 ml   Filed Weights   11/24/2021 1834 11/07/21 0330 11/08/21 0439  Weight: 82.2 kg 80.3 kg  83.1 kg   Examination: Physical Exam:  Constitutional: WN/WD overweight ill-appearing Caucasian male currently no acute distress appears a little drowsy complaining of some dyspnea and foot pain Respiratory: Diminished to auscultation bilaterally with coarse breath sounds and is wearing supplemental oxygen via nasal cannula; does have some crackles.  No appreciable wheezing or rales. Normal respiratory effort and patient is not tachypenic. No accessory muscle use.  Cardiovascular: RRR, no murmurs / rubs / gallops. S1 and S2 auscultated.  Has mild lower extremity edema Abdomen: Soft, non-tender, distended secondary body habitus.  Bowel sounds positive.  GU: Deferred. Skin: Tissue loss in his lower extremities dry gangrene Neurologic: CN 2-12 grossly intact with no focal deficits. Romberg sign and cerebellar reflexes not assessed.  Psychiatric: Normal judgment and insight.  A little somnolent and drowsy  Data Reviewed: I have personally reviewed following labs and imaging studies  CBC: Recent Labs  Lab 11/05/21 0045 11/05/21 1058 11/21/2021 0034 11/07/21 0054 11/08/21 0054  WBC 16.6* 16.6* 15.0* 18.0* 16.4*  NEUTROABS 14.6* 14.7* 13.7* 16.2* 14.3*  HGB 10.9* 10.7* 9.8* 11.0* 10.8*  HCT 34.7* 34.0* 31.0* 33.7* 33.2*  MCV 90.6 89.0 90.1 87.5 88.3  PLT 150 161 152 186 694   Basic Metabolic Panel: Recent Labs  Lab 11/04/21 0053 11/05/21 0045 11/04/2021 0034 11/07/21 0054 11/08/21 0054  NA 137 133* 133* 132* 128*  K 3.4* 4.9 3.3* 3.6 3.9  CL 108 93* 88* 95* 87*  CO2 20* 23 21* 25 24  GLUCOSE 174* 217* 200* 203* 215*  BUN 39* 62* 33* 27* 38*  CREATININE 4.60* 7.37* 4.58* 4.58* 5.57*  CALCIUM 6.8* 9.4 7.9* 9.1 9.1  MG  --  2.2 1.6* 1.8 1.8  PHOS 4.2 6.5* 4.5 4.3 5.5*   GFR: Estimated Creatinine Clearance: 14.8 mL/min (A) (by C-G formula based on SCr of 5.57 mg/dL (H)). Liver Function Tests: Recent Labs  Lab 11/04/21 0053 11/05/21 0045 11/25/2021 0034 11/07/21 0054  11/08/21 0054  AST  --  '27 25 19 18  '$ ALT  --  '29 23 23 21  '$ ALKPHOS  --  106 88 112 114  BILITOT  --  1.0 1.3* 1.2 1.2  PROT  --  6.1* 8.3* 6.2* 6.0*  ALBUMIN 1.7* 2.4* 5.5* 2.4* 2.2*   No results for input(s): "LIPASE", "AMYLASE" in the last 168 hours. No results for input(s): "AMMONIA" in the last 168 hours. Coagulation Profile: No results for input(s): "INR", "PROTIME" in the last 168 hours. Cardiac Enzymes: No results for input(s): "CKTOTAL", "CKMB", "CKMBINDEX", "TROPONINI" in the last 168 hours. BNP (last 3 results) No results for input(s): "PROBNP" in the last 8760 hours. HbA1C: No results for input(s): "HGBA1C" in the last 72 hours. CBG: Recent Labs  Lab 11/07/21 1622 11/07/21 2120 11/08/21 0607 11/08/21 1143 11/08/21 1548  GLUCAP 218* 196* 236* 153* 182*   Lipid Profile: Recent Labs    11/28/2021 0034  CHOL 63  HDL 16*  LDLCALC 29  TRIG 90  CHOLHDL 3.9   Thyroid Function Tests: No results for input(s): "TSH", "T4TOTAL", "FREET4", "T3FREE", "THYROIDAB" in the last 72 hours. Anemia Panel: No results for input(s): "VITAMINB12", "FOLATE", "FERRITIN", "TIBC", "IRON", "RETICCTPCT" in the last 72 hours. Sepsis Labs: No results  for input(s): "PROCALCITON", "LATICACIDVEN" in the last 168 hours.  Recent Results (from the past 240 hour(s))  Resp Panel by RT-PCR (Flu A&B, Covid) Anterior Nasal Swab     Status: None   Collection Time: 10/27/2021  3:22 PM   Specimen: Anterior Nasal Swab  Result Value Ref Range Status   SARS Coronavirus 2 by RT PCR NEGATIVE NEGATIVE Final    Comment: (NOTE) SARS-CoV-2 target nucleic acids are NOT DETECTED.  The SARS-CoV-2 RNA is generally detectable in upper respiratory specimens during the acute phase of infection. The lowest concentration of SARS-CoV-2 viral copies this assay can detect is 138 copies/mL. A negative result does not preclude SARS-Cov-2 infection and should not be used as the sole basis for treatment or other patient  management decisions. A negative result may occur with  improper specimen collection/handling, submission of specimen other than nasopharyngeal swab, presence of viral mutation(s) within the areas targeted by this assay, and inadequate number of viral copies(<138 copies/mL). A negative result must be combined with clinical observations, patient history, and epidemiological information. The expected result is Negative.  Fact Sheet for Patients:  EntrepreneurPulse.com.au  Fact Sheet for Healthcare Providers:  IncredibleEmployment.be  This test is no t yet approved or cleared by the [place] FDA and  has been authorized for detection and/or diagnosis of SARS-CoV-2 by FDA under an Emergency Use Authorization (EUA). This EUA will remain  in effect (meaning this test can be used) for the duration of the COVID-19 declaration under Section 564(b)(1) of the Act, 21 U.S.C.section 360bbb-3(b)(1), unless the authorization is terminated  or revoked sooner.       Influenza A by PCR NEGATIVE NEGATIVE Final   Influenza B by PCR NEGATIVE NEGATIVE Final    Comment: (NOTE) The Xpert Xpress SARS-CoV-2/FLU/RSV plus assay is intended as an aid in the diagnosis of influenza from Nasopharyngeal swab specimens and should not be used as a sole basis for treatment. Nasal washings and aspirates are unacceptable for Xpert Xpress SARS-CoV-2/FLU/RSV testing.  Fact Sheet for Patients: EntrepreneurPulse.com.au  Fact Sheet for Healthcare Providers: IncredibleEmployment.be  This test is not yet approved or cleared by the [place] FDA and has been authorized for detection and/or diagnosis of SARS-CoV-2 by FDA under an Emergency Use Authorization (EUA). This EUA will remain in effect (meaning this test can be used) for the duration of the COVID-19 declaration under Section 564(b)(1) of the Act, 21 U.S.C. section 360bbb-3(b)(1),  unless the authorization is terminated or revoked.  Performed at [facility], [address] [place], [place] [address]   Blood Culture (routine x 2)     Status: None   Collection Time: 10/17/2021  3:53 PM   Specimen: BLOOD RIGHT ARM  Result Value Ref Range Status   Specimen Description BLOOD RIGHT ARM  Final   Special Requests   Final    BOTTLES DRAWN AEROBIC AND ANAEROBIC Blood Culture adequate volume   Culture   Final    NO GROWTH 5 DAYS Performed at [facility], [address] [place], [place] [address]    Report Status 11/05/2021 FINAL  Final  Blood Culture (routine x 2)     Status: Abnormal   Collection Time: 10/19/2021  3:54 PM   Specimen: Right Antecubital; Blood  Result Value Ref Range Status   Specimen Description   Final    RIGHT ANTECUBITAL Performed at [facility], [address], [place], [place] [address]    Special Requests   Final    BOTTLES DRAWN AEROBIC  AND ANAEROBIC Blood Culture results may not be optimal due to an excessive volume of blood received in culture bottles Performed at [facility], [address], [place], [place] [address]    Culture  Setup Time   Final    GRAM NEGATIVE RODS Gram Stain Report Called to,Read Back By and Verified With:  J. LONG @ 1512 BY STEPHTR 11/01/21 CRITICAL RESULT CALLED TO, READ BACK BY AND VERIFIED WITH: E PREYER,RN'@0125'$  0/30/23 [place]    Culture (A)  Final    BACTEROIDES FRAGILIS BETA LACTAMASE POSITIVE Performed at [facility], [place] [address] [place], [place] [address]    Report Status 11/03/2021 FINAL  Final  Blood Culture ID Panel (Reflexed)     Status: Abnormal   Collection Time: 10/20/2021  3:54 PM  Result Value Ref Range Status   Enterococcus faecalis NOT DETECTED NOT DETECTED Final   Enterococcus Faecium NOT DETECTED NOT DETECTED Final   Listeria monocytogenes NOT DETECTED NOT DETECTED Final   Staphylococcus species NOT DETECTED NOT DETECTED Final   Staphylococcus aureus (BCID) NOT DETECTED NOT DETECTED  Final   Staphylococcus epidermidis NOT DETECTED NOT DETECTED Final   Staphylococcus lugdunensis NOT DETECTED NOT DETECTED Final   Streptococcus species NOT DETECTED NOT DETECTED Final   Streptococcus agalactiae NOT DETECTED NOT DETECTED Final   Streptococcus pneumoniae NOT DETECTED NOT DETECTED Final   Streptococcus pyogenes NOT DETECTED NOT DETECTED Final   A.calcoaceticus-baumannii NOT DETECTED NOT DETECTED Final   Bacteroides fragilis DETECTED (A) NOT DETECTED Final    Comment: CRITICAL RESULT CALLED TO, READ BACK BY AND VERIFIED WITH: E PREYER,RN'@0125'$  11/02/21 [place]    Enterobacterales NOT DETECTED NOT DETECTED Final   Enterobacter cloacae complex NOT DETECTED NOT DETECTED Final   Escherichia coli NOT DETECTED NOT DETECTED Final   Klebsiella aerogenes NOT DETECTED NOT DETECTED Final   Klebsiella oxytoca NOT DETECTED NOT DETECTED Final   Klebsiella pneumoniae NOT DETECTED NOT DETECTED Final   Proteus species NOT DETECTED NOT DETECTED Final   Salmonella species NOT DETECTED NOT DETECTED Final   Serratia marcescens NOT DETECTED NOT DETECTED Final   Haemophilus influenzae NOT DETECTED NOT DETECTED Final   Neisseria meningitidis NOT DETECTED NOT DETECTED Final   Pseudomonas aeruginosa NOT DETECTED NOT DETECTED Final   Stenotrophomonas maltophilia NOT DETECTED NOT DETECTED Final   Candida albicans NOT DETECTED NOT DETECTED Final   Candida auris NOT DETECTED NOT DETECTED Final   Candida glabrata NOT DETECTED NOT DETECTED Final   Candida krusei NOT DETECTED NOT DETECTED Final   Candida parapsilosis NOT DETECTED NOT DETECTED Final   Candida tropicalis NOT DETECTED NOT DETECTED Final   Cryptococcus neoformans/gattii NOT DETECTED NOT DETECTED Final    Comment: Performed at [facility], [place] [address] [place], [place] [address]  Culture, body fluid w Gram Stain-bottle     Status: None   Collection Time: 11/02/21  8:54 AM   Specimen: Pleura  Result Value Ref Range Status   Specimen  Description PLEURAL BOTTLES DRAWN AEROBIC AND ANAEROBIC  Final   Special Requests Blood Culture adequate volume  Final   Culture   Final    NO GROWTH 5 DAYS Performed at [facility], [address], [place], [place] [address]    Report Status 11/07/2021 FINAL  Final  Gram stain     Status: None   Collection Time: 11/02/21  8:54 AM   Specimen: Pleura  Result Value Ref Range Status   Specimen Description PLEURAL  Final   Special  Requests NONE  Final   Gram Stain   Final    NO ORGANISMS SEEN WBC PRESENT, PREDOMINANTLY PMN CYTOSPIN SMEAR Performed at [facility], [address], [place],  [address]    Report Status 11/02/2021 FINAL  Final    Radiology Studies: DG CHEST PORT 1 VIEW  Result Date: 11/08/2021 CLINICAL DATA:  Shortness of breath EXAM: PORTABLE CHEST 1 VIEW COMPARISON:  11/07/2021 FINDINGS: Cardiac shadow is stable. Aortic calcifications are again seen. Increasing right-sided pleural effusion is noted although this may be in part due to semi erect positioning. Left-sided effusion is noted as well. Central vascular congestion is noted with mild edema. IMPRESSION: Increasing central vascular congestion. Bilateral pleural effusions which appear worsened from the prior study but may be in part due to patient positioning. Electronically Signed   By: Inez Catalina M.D.   On: 11/08/2021 09:45   DG CHEST PORT 1 VIEW  Result Date: 11/07/2021 CLINICAL DATA:  Shortness of breath. EXAM: PORTABLE CHEST 1 VIEW COMPARISON:  11/05/2021 FINDINGS: The heart size and mediastinal contours are within normal limits. Aortic atherosclerotic calcification incidentally noted. Low lung volumes are again demonstrated with bibasilar atelectasis. Small bilateral pleural effusions also unchanged. Persistent symmetric bilateral perihilar opacities are seen with a somewhat bat wing appearance, suspicious for pulmonary edema. IMPRESSION: No significant change in low lung volumes, bibasilar atelectasis, and  small bilateral pleural effusions. Central mild pulmonary edema also suspected. Electronically Signed   By: Marlaine Hind M.D.   On: 11/07/2021 09:03    Scheduled Meds:  aspirin EC  81 mg Oral Daily   atorvastatin  20 mg Oral Daily   calcitRIOL  0.25 mcg Oral Q M,W,F-HD   calcium acetate  667 mg Oral TID WC   Chlorhexidine Gluconate Cloth  6 each Topical Q0600   Chlorhexidine Gluconate Cloth  6 each Topical Q0600   [START ON 11/09/2021] Chlorhexidine Gluconate Cloth  6 each Topical Q0600   chlorpheniramine-HYDROcodone  5 mL Oral Once   clopidogrel  75 mg Oral Daily   feeding supplement (GLUCERNA SHAKE)  237 mL Oral TID BM   ferrous sulfate  325 mg Oral Q breakfast   insulin aspart  0-5 Units Subcutaneous QHS   insulin aspart  0-6 Units Subcutaneous TID WC   insulin glargine-yfgn  8 Units Subcutaneous QHS   midodrine  10 mg Oral TID WC   pantoprazole  40 mg Oral Daily   saccharomyces boulardii  250 mg Oral BID   sodium chloride flush  3 mL Intravenous Q12H   Continuous Infusions:  sodium chloride 250 mL (11/29/2021 2154)   ampicillin-sulbactam (UNASYN) IV     anticoagulant sodium citrate      LOS: 8 days   Raiford Noble, DO [facility] Available via Epic secure chat 7am-7pm After these hours, please refer to coverage provider listed on [email or web address] 11/08/2021, 6:19 PM

## 2021-11-08 NOTE — Progress Notes (Signed)
  [facility] Progress Note   Subjective:  pt seen in room. No new /co's.   Objective Vitals:   11/07/21 1134 11/07/21 1955 11/08/21 0439 11/08/21 1147  BP: (!) 95/57 (!) 91/55 (!) 91/56 (!) 89/62  Pulse:  93 87 87  Resp:  '18 18 18  '$ Temp:  (!) 97.4 F (36.3 C) 97.7 F (36.5 C) (!) 97.4 F (36.3 C)  TempSrc:  Oral Oral Oral  SpO2:  100% 95% 94%  Weight:   83.1 kg   Height:       Physical Exam General: Chronically ill appearing man, NAD. 2L [place] Heart: RRR; 2/6 murmur Lungs: CTAB; decreased in bases Abdomen: soft Ext: no edema, multiple bilateral leg/foot wounds/ gangrene Dialysis Access: L AVF + thrill  OP HD: [facility] MWF 3:45h  77kg   400/500   2K/2.5Ca bath   L AVF Hep none - Calcitriol 0.25 mcg PO TIW - Mircera 150 mcg IV q 2 weeks - Venofer 50 mg IV weekly  Assessment/Plan: Sepsis 2/2 Gangrene R great toe: S/P R. posterior tibial artery angioplasty per Dr. Carlis Abbott. Per [facility]VS.   B Fragilis Bacteremia: Remains on course of Zosyn. Acute systolic HF- echo showed LVEF 25-30% Volume - no LE edema, CXR w/ bilat effusions which may be layering making xray look worse than it is. Up 5-6kg by wts. Lower vol w/ HD as tolerated.  Pleural effusions - s/p R thoracentesis 11/02/2021 - 1.5L off. Aortic Valve disease-W/U per Cardiology.  ESRD: Usual MWF schedule. HD Monday.  Anemia: Hgb 10-11, no esa needed at this time. Follow.  Secondary hyperparathyroidism - CCa and phos at goal. Continue binder, VDRA Hypotension - continue midodrine '10mg'$  TID Nutrition - Very low albumin, continue protein supplements DMT2-management per primary   Kelly Splinter, MD 11/08/2021, 2:07 PM  Recent Labs  Lab 11/07/21 0054 11/08/21 0054  HGB 11.0* 10.8*  ALBUMIN 2.4* 2.2*  CALCIUM 9.1 9.1  PHOS 4.3 5.5*  CREATININE 4.58* 5.57*  K 3.6 3.9     Inpatient medications:  aspirin EC  81 mg Oral Daily   atorvastatin  20 mg Oral Daily   calcitRIOL  0.25 mcg Oral Q M,W,F-HD    calcium acetate  667 mg Oral TID WC   Chlorhexidine Gluconate Cloth  6 each Topical Q0600   Chlorhexidine Gluconate Cloth  6 each Topical Q0600   chlorpheniramine-HYDROcodone  5 mL Oral Once   clopidogrel  75 mg Oral Daily   darbepoetin ([name]) injection - DIALYSIS  100 mcg Subcutaneous Q Fri-1800   feeding supplement (GLUCERNA SHAKE)  237 mL Oral TID BM   ferrous sulfate  325 mg Oral Q breakfast   insulin aspart  0-5 Units Subcutaneous QHS   insulin aspart  0-6 Units Subcutaneous TID WC   insulin glargine-yfgn  8 Units Subcutaneous QHS   midodrine  10 mg Oral TID WC   pantoprazole  40 mg Oral Daily   saccharomyces boulardii  250 mg Oral BID   sodium chloride flush  3 mL Intravenous Q12H    sodium chloride 250 mL (11/27/2021 2154)   ampicillin-sulbactam (UNASYN) IV     anticoagulant sodium citrate     sodium chloride, acetaminophen **OR** acetaminophen, acetaminophen, alteplase, anticoagulant sodium citrate, diphenhydrAMINE, guaiFENesin, heparin, hydrALAZINE, HYDROmorphone (DILAUDID) injection, labetalol, lidocaine (PF), lidocaine-prilocaine, melatonin, ondansetron **OR** ondansetron (ZOFRAN) IV, ondansetron (ZOFRAN) IV, oxyCODONE, pentafluoroprop-tetrafluoroeth, sodium chloride flush

## 2021-11-09 ENCOUNTER — Inpatient Hospital Stay (HOSPITAL_COMMUNITY): Payer: Medicare Other

## 2021-11-09 DIAGNOSIS — N186 End stage renal disease: Secondary | ICD-10-CM | POA: Diagnosis not present

## 2021-11-09 DIAGNOSIS — R7989 Other specified abnormal findings of blood chemistry: Secondary | ICD-10-CM | POA: Diagnosis not present

## 2021-11-09 DIAGNOSIS — I10 Essential (primary) hypertension: Secondary | ICD-10-CM | POA: Diagnosis not present

## 2021-11-09 DIAGNOSIS — J90 Pleural effusion, not elsewhere classified: Secondary | ICD-10-CM | POA: Diagnosis not present

## 2021-11-09 LAB — CBC WITH DIFFERENTIAL/PLATELET
Abs Immature Granulocytes: 0.1 10*3/uL — ABNORMAL HIGH (ref 0.00–0.07)
Basophils Absolute: 0.1 10*3/uL (ref 0.0–0.1)
Basophils Relative: 1 %
Eosinophils Absolute: 0.4 10*3/uL (ref 0.0–0.5)
Eosinophils Relative: 2 %
HCT: 34.4 % — ABNORMAL LOW (ref 39.0–52.0)
Hemoglobin: 10.8 g/dL — ABNORMAL LOW (ref 13.0–17.0)
Immature Granulocytes: 1 %
Lymphocytes Relative: 3 %
Lymphs Abs: 0.5 10*3/uL — ABNORMAL LOW (ref 0.7–4.0)
MCH: 28.4 pg (ref 26.0–34.0)
MCHC: 31.4 g/dL (ref 30.0–36.0)
MCV: 90.5 fL (ref 80.0–100.0)
Monocytes Absolute: 0.9 10*3/uL (ref 0.1–1.0)
Monocytes Relative: 5 %
Neutro Abs: 14.7 10*3/uL — ABNORMAL HIGH (ref 1.7–7.7)
Neutrophils Relative %: 88 %
Platelets: 222 10*3/uL (ref 150–400)
RBC: 3.8 MIL/uL — ABNORMAL LOW (ref 4.22–5.81)
RDW: 20.9 % — ABNORMAL HIGH (ref 11.5–15.5)
WBC: 16.7 10*3/uL — ABNORMAL HIGH (ref 4.0–10.5)
nRBC: 0 % (ref 0.0–0.2)

## 2021-11-09 LAB — GLUCOSE, CAPILLARY
Glucose-Capillary: 177 mg/dL — ABNORMAL HIGH (ref 70–99)
Glucose-Capillary: 156 mg/dL — ABNORMAL HIGH (ref 70–99)
Glucose-Capillary: 192 mg/dL — ABNORMAL HIGH (ref 70–99)
Glucose-Capillary: 204 mg/dL — ABNORMAL HIGH (ref 70–99)

## 2021-11-09 LAB — COMPREHENSIVE METABOLIC PANEL
ALT: 19 U/L (ref 0–44)
AST: 19 U/L (ref 15–41)
Albumin: 2.3 g/dL — ABNORMAL LOW (ref 3.5–5.0)
Alkaline Phosphatase: 118 U/L (ref 38–126)
Anion gap: 17 — ABNORMAL HIGH (ref 5–15)
BUN: 45 mg/dL — ABNORMAL HIGH (ref 6–20)
CO2: 25 mmol/L (ref 22–32)
Calcium: 9.2 mg/dL (ref 8.9–10.3)
Chloride: 86 mmol/L — ABNORMAL LOW (ref 98–111)
Creatinine, Ser: 6.5 mg/dL — ABNORMAL HIGH (ref 0.61–1.24)
GFR, Estimated: 9 mL/min — ABNORMAL LOW (ref >60)
Glucose, Bld: 183 mg/dL — ABNORMAL HIGH (ref 70–99)
Potassium: 4.3 mmol/L (ref 3.5–5.1)
Sodium: 128 mmol/L — ABNORMAL LOW (ref 135–145)
Total Bilirubin: 0.9 mg/dL (ref 0.3–1.2)
Total Protein: 6.2 g/dL — ABNORMAL LOW (ref 6.5–8.1)

## 2021-11-09 LAB — MAGNESIUM: Magnesium: 1.9 mg/dL (ref 1.7–2.4)

## 2021-11-09 LAB — MRSA NEXT GEN BY PCR, NASAL: MRSA by PCR Next Gen: NOT DETECTED

## 2021-11-09 LAB — PHOSPHORUS: Phosphorus: 6.8 mg/dL — ABNORMAL HIGH (ref 2.5–4.6)

## 2021-11-09 MED ORDER — SODIUM CHLORIDE 0.9 % IV SOLN: INTRAVENOUS | Status: DC

## 2021-11-09 MED ORDER — ZINC OXIDE 40 % EX OINT
TOPICAL_OINTMENT | Freq: Two times a day (BID) | CUTANEOUS | Status: DC
  Filled 2021-11-09: qty 57

## 2021-11-09 NOTE — Progress Notes (Signed)
Cardiology Progress Note  Patient ID: Paul Gonzalez MRN: [id number] DOB: 01/17/1965 Date of Encounter: 11/09/2021  Primary Cardiologist: Larae Grooms, MD  Subjective   Chief Complaint: SOB  HPI: Reports he is short of breath.  N.p.o. for TEE but this will not occur today.  It is okay for him to eat.  Murmur consistent with severe aortic stenosis.  Telemetry unremarkable.  ROS:  All other ROS reviewed and negative. Pertinent positives noted in the HPI.     Inpatient Medications  Scheduled Meds:  aspirin EC  81 mg Oral Daily   atorvastatin  20 mg Oral Daily   calcitRIOL  0.25 mcg Oral Q M,W,F-HD   calcium acetate  667 mg Oral TID WC   Chlorhexidine Gluconate Cloth  6 each Topical Q0600   Chlorhexidine Gluconate Cloth  6 each Topical Q0600   Chlorhexidine Gluconate Cloth  6 each Topical Q0600   chlorpheniramine-HYDROcodone  5 mL Oral Once   clopidogrel  75 mg Oral Daily   feeding supplement (GLUCERNA SHAKE)  237 mL Oral TID BM   ferrous sulfate  325 mg Oral Q breakfast   insulin aspart  0-5 Units Subcutaneous QHS   insulin aspart  0-6 Units Subcutaneous TID WC   insulin glargine-yfgn  8 Units Subcutaneous QHS   midodrine  10 mg Oral TID WC   pantoprazole  40 mg Oral Daily   saccharomyces boulardii  250 mg Oral BID   sodium chloride flush  3 mL Intravenous Q12H   Continuous Infusions:  sodium chloride 250 mL (11/13/2021 2154)   ampicillin-sulbactam (UNASYN) IV 1.5 g (11/09/21 0802)   anticoagulant sodium citrate     PRN Meds: sodium chloride, acetaminophen **OR** acetaminophen, acetaminophen, alteplase, anticoagulant sodium citrate, diphenhydrAMINE, guaiFENesin, heparin, hydrALAZINE, HYDROmorphone (DILAUDID) injection, labetalol, lidocaine (PF), lidocaine-prilocaine, melatonin, ondansetron **OR** ondansetron (ZOFRAN) IV, ondansetron (ZOFRAN) IV, oxyCODONE, pentafluoroprop-tetrafluoroeth, sodium chloride flush   Vital Signs   Vitals:   11/08/21 1545 11/08/21 2020  11/09/21 0316 11/09/21 0742  BP: (!) '97/59 97/71 90/62 '$ (!) 98/52  Pulse: 87  95 96  Resp: '17 17  20  '$ Temp: 98.1 F (36.7 C) 98.6 F (37 C) 98.1 F (36.7 C) 98 F (36.7 C)  TempSrc: Oral Oral Oral Oral  SpO2: 93% 95% 94% 100%  Weight:   84.7 kg   Height:        Intake/Output Summary (Last 24 hours) at 11/09/2021 0839 Last data filed at 11/09/2021 0318 Gross per 24 hour  Intake 1117 ml  Output 0 ml  Net 1117 ml      11/09/2021    3:16 AM 11/08/2021    4:39 AM 11/07/2021    3:30 AM  Last 3 Weights  Weight (lbs) 186 lb 11.7 oz 183 lb 3.2 oz 177 lb 0.5 oz  Weight (kg) 84.7 kg 83.1 kg 80.3 kg      Telemetry  Overnight telemetry shows sinus rhythm in the 80s, which I personally reviewed.   ECG  The most recent ECG shows sinus rhythm heart rate 93, old anterolateral infarct, which I personally reviewed.   Physical Exam   Vitals:   11/08/21 1545 11/08/21 2020 11/09/21 0316 11/09/21 0742  BP: (!) '97/59 97/71 90/62 '$ (!) 98/52  Pulse: 87  95 96  Resp: '17 17  20  '$ Temp: 98.1 F (36.7 C) 98.6 F (37 C) 98.1 F (36.7 C) 98 F (36.7 C)  TempSrc: Oral Oral Oral Oral  SpO2: 93% 95% 94% 100%  Weight:   84.7 kg  Height:        Intake/Output Summary (Last 24 hours) at 11/09/2021 0839 Last data filed at 11/09/2021 0318 Gross per 24 hour  Intake 1117 ml  Output 0 ml  Net 1117 ml       11/09/2021    3:16 AM 11/08/2021    4:39 AM 11/07/2021    3:30 AM  Last 3 Weights  Weight (lbs) 186 lb 11.7 oz 183 lb 3.2 oz 177 lb 0.5 oz  Weight (kg) 84.7 kg 83.1 kg 80.3 kg    Body mass index is 27.58 kg/m.  General: Well nourished, well developed, in no acute distress Head: Atraumatic, normal size  Eyes: PEERLA, EOMI  Neck: Supple, JVD 10-12 cmH2O Endocrine: No thryomegaly Cardiac: Normal regular rate and rhythm, harsh 3 out of 6 systolic ejection murmur, late peaking consistent with severe aortic stenosis Lungs: Crackles at the lung bases Abd: Soft, nontender, no hepatomegaly  Ext:  Absent pedal pulses, right toe with dry gangrene Musculoskeletal: Right toe with dry gangrene Skin: Warm and dry, no rashes   Neuro: Alert and oriented to person, place, time, and situation, CNII-XII grossly intact, no focal deficits  Psych: Normal mood and affect   Labs  High Sensitivity Troponin:  No results for input(s): "TROPONINIHS" in the last 720 hours.   Cardiac EnzymesNo results for input(s): "TROPONINI" in the last 168 hours. No results for input(s): "TROPIPOC" in the last 168 hours.  Chemistry Recent Labs  Lab 11/07/21 0054 11/08/21 0054 11/09/21 0030  NA 132* 128* 128*  K 3.6 3.9 4.3  CL 95* 87* 86*  CO2 '25 24 25  '$ GLUCOSE 203* 215* 183*  BUN 27* 38* 45*  CREATININE 4.58* 5.57* 6.50*  CALCIUM 9.1 9.1 9.2  PROT 6.2* 6.0* 6.2*  ALBUMIN 2.4* 2.2* 2.3*  AST '19 18 19  '$ ALT '23 21 19  '$ ALKPHOS 112 114 118  BILITOT 1.2 1.2 0.9  GFRNONAA 14* 11* 9*  ANIONGAP 12 17* 17*    Hematology Recent Labs  Lab 11/07/21 0054 11/08/21 0054 11/09/21 0030  WBC 18.0* 16.4* 16.7*  RBC 3.85* 3.76* 3.80*  HGB 11.0* 10.8* 10.8*  HCT 33.7* 33.2* 34.4*  MCV 87.5 88.3 90.5  MCH 28.6 28.7 28.4  MCHC 32.6 32.5 31.4  RDW 21.1* 21.2* 20.9*  PLT 186 196 222   BNPNo results for input(s): "BNP", "PROBNP" in the last 168 hours.  DDimer No results for input(s): "DDIMER" in the last 168 hours.   Radiology  DG CHEST PORT 1 VIEW  Result Date: 11/08/2021 CLINICAL DATA:  Shortness of breath EXAM: PORTABLE CHEST 1 VIEW COMPARISON:  11/07/2021 FINDINGS: Cardiac shadow is stable. Aortic calcifications are again seen. Increasing right-sided pleural effusion is noted although this may be in part due to semi erect positioning. Left-sided effusion is noted as well. Central vascular congestion is noted with mild edema. IMPRESSION: Increasing central vascular congestion. Bilateral pleural effusions which appear worsened from the prior study but may be in part due to patient positioning. Electronically Signed    By: Inez Catalina M.D.   On: 11/08/2021 09:45    Cardiac Studies  TTE 11/01/2021  1. Left ventricular ejection fraction, by estimation, is 25 to 30%. The  left ventricle has severely decreased function. The left ventricle  demonstrates global hypokinesis. Left ventricular diastolic parameters are  consistent with Grade III diastolic  dysfunction (restrictive).   2. Right ventricular systolic function is mildly reduced. The right  ventricular size is severely enlarged. There is moderately elevated  pulmonary artery systolic pressure. The estimated right ventricular  systolic pressure is 32.9 mmHg.   3. Left atrial size was severely dilated.   4. Right atrial size was mildly dilated.   5. A small pericardial effusion is present. The pericardial effusion is  circumferential.   6. The mitral valve is abnormal. Mild mitral valve regurgitation. Mild  mitral stenosis. Moderate mitral annular calcification.   7. The aortic valve is calcified. Aortic valve regurgitation is moderate  to severe. Moderate to severe aortic valve stenosis. GRadient of 36 mm Hg  at max despite decreased strok volume index. DVI 0.20.   Vasc Procedure 11/05/2021 1.  Ultrasound-guided access left common femoral artery 2.  Aortogram with catheter selection of aorta 3.  Bilateral lower extremity arteriogram with selection of third order branches in the right lower extremity 4.  Right posterior tibial artery angioplasty (2.5 mm x 150 mm [place]) 5.  Right mid to distal SFA drug-coated balloon angioplasty (5 mm x 150 mm drug-coated impact) 6.  94 minutes of monitored moderate conscious sedation time 7.  Mynx closure of the left common femoral artery  Patient Profile  Oval Cavazos is a 56 y.o. male with ESRD on hemodialysis, aortic stenosis, hypertension, diabetes who was admitted to [facility] on 10/04/2021 with sepsis secondary to right toe gangrene.  Course complicated by bacteremia with Bacteroides  species.  Cardiology was consulted for severe aortic stenosis/regurgitation with newly reduced ejection fraction of 30%.  Assessment & Plan   #Acute systolic heart failure, EF 25-30% #Severe aortic stenosis/regurgitation #Bacteremia -Admitted with sepsis and right toe gangrene.  Status post peripheral intervention to the peripheral arteries.  Underwent angioplasty to the right PT as well as right SFA.  He has critical limb ischemia. -Known history of aortic stenosis.  Now with severe aortic stenosis and likely severe aortic regurgitation. -I do have concerns he may end up having endocarditis.  He is here with bacteremia.  He needs a transesophageal echo. -He is currently hypotensive requiring midodrine.  He is not a candidate for advanced therapies given his ESRD.  I believe he will have limited options. -He cannot be on digoxin.  He is not a candidate for inotropes.  He is not a candidate for mechanical support.  Unclear what we will do with severe aortic stenosis in the setting.  Endocarditis must be excluded first. -He also has uncontrolled diabetes.  He has severe coronary calcifications.  I believe he will have significant CAD.  Unclear if he would be a candidate for CABG and aortic valve replacement.  Again we will complete our work-up with transesophageal echo and then have this discussion. -Would strongly consider palliative care consult. -No beta-blocker.  No ACE/ARB/ARNI/MRA given ESRD.  No afterload reduction with Imdur or hydralazine due to hypotension. -Volume status per nephrology.  #PAD #Critical limb ischemia -Here with right toe gangrene.  Status post right PT angioplasty and right SFA angioplasty on 11/05/2021 with vascular surgery. -Continue aspirin statin and Plavix.  #Severe coronary calcifications -No angina but highly suspect he will have CAD.  #ESRD -Per nephrology   For questions or updates, please contact [place] Please consult [email or web address] for  contact info under    Signed, [place] B[place] T. Audie Box, MD, [place]  11/09/2021 8:39 AM

## 2021-11-09 NOTE — Progress Notes (Signed)
Pt received in bed no c/os no distress noted stable for HD avf +/+ ufg 3L

## 2021-11-09 NOTE — Progress Notes (Incomplete)
[facility]  Date of Admission:  10/06/2021   Total days of inpatient antibiotics ***  Principal Problem:   Pleural effusion, bilateral Active Problems:   Essential hypertension   Mixed hyperlipidemia   Anemia in CKD (chronic kidney disease)   ESRD (end stage renal disease) on dialysis (HCC)   Elevated brain natriuretic peptide (BNP) level   Sepsis (HCC)   Cellulitis in diabetic foot (HCC)   Gangrene of toe of right foot (HCC)   Lactic acidosis   Iron deficiency anemia   Hypotension   GERD (gastroesophageal reflux disease)   Hypoalbuminemia due to protein-calorie malnutrition (HCC)   Transaminitis   PVD (peripheral vascular disease) (HCC)   Severe protein-calorie malnutrition (HCC)          Assessment: *** #B fargilis bacteremia likely 2/2 likely gangrenous right toe #ESRD on HD #Hx of MSSA bacteremia #DM #leukocytosis Recommendations: -Follow ortho re-assement   Microbiology:   Antibiotics:  Cultures: Blood 10/28 1/2 B fargilis Urine  Other   SUBJECTIVE: *** Interval:   Review of Systems: ROS   Scheduled Meds:  aspirin EC  81 mg Oral Daily   atorvastatin  20 mg Oral Daily   calcitRIOL  0.25 mcg Oral Q M,W,F-HD   calcium acetate  667 mg Oral TID WC   Chlorhexidine Gluconate Cloth  6 each Topical Q0600   Chlorhexidine Gluconate Cloth  6 each Topical Q0600   Chlorhexidine Gluconate Cloth  6 each Topical Q0600   chlorpheniramine-HYDROcodone  5 mL Oral Once   clopidogrel  75 mg Oral Daily   feeding supplement (GLUCERNA SHAKE)  237 mL Oral TID BM   ferrous sulfate  325 mg Oral Q breakfast   insulin aspart  0-5 Units Subcutaneous QHS   insulin aspart  0-6 Units Subcutaneous TID WC   insulin glargine-yfgn  8 Units Subcutaneous QHS   midodrine  10 mg Oral TID WC   pantoprazole  40 mg Oral Daily   saccharomyces boulardii  250 mg Oral BID   sodium chloride flush  3 mL Intravenous Q12H   Continuous Infusions:   sodium chloride 250 mL (11/07/2021 2154)   ampicillin-sulbactam (UNASYN) IV Stopped (11/08/21 1902)   anticoagulant sodium citrate     PRN Meds:.sodium chloride, acetaminophen **OR** acetaminophen, acetaminophen, alteplase, anticoagulant sodium citrate, diphenhydrAMINE, guaiFENesin, heparin, hydrALAZINE, HYDROmorphone (DILAUDID) injection, labetalol, lidocaine (PF), lidocaine-prilocaine, melatonin, ondansetron **OR** ondansetron (ZOFRAN) IV, ondansetron (ZOFRAN) IV, oxyCODONE, pentafluoroprop-tetrafluoroeth, sodium chloride flush No Known Allergies  OBJECTIVE: Vitals:   11/08/21 1455 11/08/21 1545 11/08/21 2020 11/09/21 0316  BP: (!) 88/56 (!) '97/59 97/71 90/62 '$  Pulse:  87  95  Resp:  17 17   Temp:  98.1 F (36.7 C) 98.6 F (37 C) 98.1 F (36.7 C)  TempSrc:  Oral Oral Oral  SpO2:  93% 95% 94%  Weight:    84.7 kg  Height:       Body mass index is 27.58 kg/m.  Physical Exam    Lab Results Lab Results  Component Value Date   WBC 16.7 (H) 11/09/2021   HGB 10.8 (L) 11/09/2021   HCT 34.4 (L) 11/09/2021   MCV 90.5 11/09/2021   PLT 222 11/09/2021    Lab Results  Component Value Date   CREATININE 6.50 (H) 11/09/2021   BUN 45 (H) 11/09/2021   NA 128 (L) 11/09/2021   K 4.3 11/09/2021   CL 86 (L) 11/09/2021   CO2 25 11/09/2021  Lab Results  Component Value Date   ALT 19 11/09/2021   AST 19 11/09/2021   ALKPHOS 118 11/09/2021   BILITOT 0.9 11/09/2021        Laurice Record, [facility] 11/09/2021, 5:36 AM

## 2021-11-09 NOTE — Procedures (Signed)
Patient seen on Hemodialysis. BP (!) 86/51   Pulse 70   Temp 98.2 F (36.8 C) (Oral)   Resp 16   Ht '5\' 9"'$  (1.753 m)   Wt 86.1 kg   SpO2 100%   BMI 28.03 kg/m   QB 400, UF goal 3L Tolerating treatment without complaints at this time.   Elmarie Shiley MD [facility]. Office # [phone] Pager # [phone] 10:20 AM

## 2021-11-09 NOTE — Progress Notes (Signed)
[facility] for Infectious Disease  Date of Admission:  10/19/2021      Total days of antibiotics    ASSESSMENT: Paul Gonzalez is a 56 y.o. male admitted with gangrene to the Right great toe and left foot. Secondary bacteremia with B fragillis. He has undergone vascular surgery optimization with Dr. Carlis Abbott on 11/02 where he underwent  right PT angioplasty and right SFA angioplasty and awaiting orthopedic surgery consultation for options to manage his ischemic wounds; LLE with only peroneal run off. Will continue current IV antibiotics for now given secondary bacteremia that caused him to be acutely ill. Regarding long term prognosis of the foot, I don't see much of a benefit in prolonged course of antibiotics given the current state of his wounds and fear he will continue to develop new infections and not something we could dutifully prevent with suppressive antibiotics.   TEE to follow severe AS today. Cardiology with concerns over endocarditis. Follow for results. Would be exceptionally unusual for bacteroides to cause native valve endocarditis. He did have MSSA bacteremia in March of this year --> he has received standard 6 week course of IV Cefazolin for this. He had worsening AS described at that visit as well.    PLAN: Continue IV unasyn  Follow TEE Follow ortho eval   Principal Problem:   Pleural effusion, bilateral Active Problems:   Essential hypertension   Mixed hyperlipidemia   Anemia in CKD (chronic kidney disease)   ESRD (end stage renal disease) on dialysis (HCC)   Elevated brain natriuretic peptide (BNP) level   Sepsis (HCC)   Cellulitis in diabetic foot (HCC)   Gangrene of toe of right foot (HCC)   Lactic acidosis   Iron deficiency anemia   Hypotension   GERD (gastroesophageal reflux disease)   Hypoalbuminemia due to protein-calorie malnutrition (HCC)   Transaminitis   PVD (peripheral vascular disease) (HCC)   Severe protein-calorie malnutrition  (HCC)    aspirin EC  81 mg Oral Daily   atorvastatin  20 mg Oral Daily   calcitRIOL  0.25 mcg Oral Q M,W,F-HD   calcium acetate  667 mg Oral TID WC   Chlorhexidine Gluconate Cloth  6 each Topical Q0600   Chlorhexidine Gluconate Cloth  6 each Topical Q0600   Chlorhexidine Gluconate Cloth  6 each Topical Q0600   chlorpheniramine-HYDROcodone  5 mL Oral Once   clopidogrel  75 mg Oral Daily   feeding supplement (GLUCERNA SHAKE)  237 mL Oral TID BM   ferrous sulfate  325 mg Oral Q breakfast   insulin aspart  0-5 Units Subcutaneous QHS   insulin aspart  0-6 Units Subcutaneous TID WC   insulin glargine-yfgn  8 Units Subcutaneous QHS   midodrine  10 mg Oral TID WC   pantoprazole  40 mg Oral Daily   saccharomyces boulardii  250 mg Oral BID   sodium chloride flush  3 mL Intravenous Q12H    SUBJECTIVE: Seen during dialysis treatment today.  States his A1C was > 7% at last check.  Wants to preserve his left foot as much as he can with medical therapy.    Review of Systems: Review of Systems  Constitutional:  Negative for chills and fever.  HENT:  Negative for tinnitus.   Eyes:  Negative for blurred vision and photophobia.  Respiratory:  Negative for cough and sputum production.   Cardiovascular:  Negative for chest pain.  Gastrointestinal:  Negative for diarrhea, nausea and vomiting.  Genitourinary:  Negative for dysuria.  Musculoskeletal:  Positive for joint pain.  Skin:  Negative for rash.  Neurological:  Negative for headaches.    No Known Allergies  OBJECTIVE: Vitals:   11/09/21 1230 11/09/21 1246 11/09/21 1300 11/09/21 1304  BP: (!) 77/38 (!) 81/46 (!) 87/48   Pulse: 82 83 93 89  Resp:  13    Temp:  98 F (36.7 C)    TempSrc:  Oral    SpO2: 100% 100% 100% 100%  Weight:   84.2 kg   Height:       Body mass index is 27.41 kg/m.  Physical Exam Vitals reviewed.  Constitutional:      Appearance: He is well-developed.     Comments: Seated comfortably in chair during  visit.   HENT:     Mouth/Throat:     Dentition: Normal dentition. No dental abscesses.  Cardiovascular:     Rate and Rhythm: Normal rate and regular rhythm.     Heart sounds: Normal heart sounds.  Pulmonary:     Effort: Pulmonary effort is normal.     Breath sounds: Normal breath sounds.  Abdominal:     General: There is no distension.     Palpations: Abdomen is soft.     Tenderness: There is no abdominal tenderness.  Musculoskeletal:     Comments: Right great toe is necrotic and dry.   Lymphadenopathy:     Cervical: No cervical adenopathy.  Skin:    General: Skin is warm and dry.     Findings: No rash.  Neurological:     Mental Status: He is alert and oriented to person, place, and time.  Psychiatric:        Judgment: Judgment normal.     Comments: In good spirits today and engaged in care discussion.           Lab Results Lab Results  Component Value Date   WBC 16.7 (H) 11/09/2021   HGB 10.8 (L) 11/09/2021   HCT 34.4 (L) 11/09/2021   MCV 90.5 11/09/2021   PLT 222 11/09/2021    Lab Results  Component Value Date   CREATININE 6.50 (H) 11/09/2021   BUN 45 (H) 11/09/2021   NA 128 (L) 11/09/2021   K 4.3 11/09/2021   CL 86 (L) 11/09/2021   CO2 25 11/09/2021    Lab Results  Component Value Date   ALT 19 11/09/2021   AST 19 11/09/2021   ALKPHOS 118 11/09/2021   BILITOT 0.9 11/09/2021     Microbiology: Recent Results (from the past 240 hour(s))  Resp Panel by RT-PCR (Flu A&B, Covid) Anterior Nasal Swab     Status: None   Collection Time: 10/10/2021  3:22 PM   Specimen: Anterior Nasal Swab  Result Value Ref Range Status   SARS Coronavirus 2 by RT PCR NEGATIVE NEGATIVE Final    Comment: (NOTE) SARS-CoV-2 target nucleic acids are NOT DETECTED.  The SARS-CoV-2 RNA is generally detectable in upper respiratory specimens during the acute phase of infection. The lowest concentration of SARS-CoV-2 viral copies this assay can detect is 138 copies/mL. A negative  result does not preclude SARS-Cov-2 infection and should not be used as the sole basis for treatment or other patient management decisions. A negative result may occur with  improper specimen collection/handling, submission of specimen other than nasopharyngeal swab, presence of viral mutation(s) within the areas targeted by this assay, and inadequate number of viral copies(<138 copies/mL). A negative result must be combined with clinical  observations, patient history, and epidemiological information. The expected result is Negative.  Fact Sheet for Patients:  EntrepreneurPulse.com.au  Fact Sheet for Healthcare Providers:  IncredibleEmployment.be  This test is no t yet approved or cleared by the [place] FDA and  has been authorized for detection and/or diagnosis of SARS-CoV-2 by FDA under an Emergency Use Authorization (EUA). This EUA will remain  in effect (meaning this test can be used) for the duration of the COVID-19 declaration under Section 564(b)(1) of the Act, 21 U.S.C.section 360bbb-3(b)(1), unless the authorization is terminated  or revoked sooner.       Influenza A by PCR NEGATIVE NEGATIVE Final   Influenza B by PCR NEGATIVE NEGATIVE Final    Comment: (NOTE) The Xpert Xpress SARS-CoV-2/FLU/RSV plus assay is intended as an aid in the diagnosis of influenza from Nasopharyngeal swab specimens and should not be used as a sole basis for treatment. Nasal washings and aspirates are unacceptable for Xpert Xpress SARS-CoV-2/FLU/RSV testing.  Fact Sheet for Patients: EntrepreneurPulse.com.au  Fact Sheet for Healthcare Providers: IncredibleEmployment.be  This test is not yet approved or cleared by the [place] FDA and has been authorized for detection and/or diagnosis of SARS-CoV-2 by FDA under an Emergency Use Authorization (EUA). This EUA will remain in effect (meaning this test can be used)  for the duration of the COVID-19 declaration under Section 564(b)(1) of the Act, 21 U.S.C. section 360bbb-3(b)(1), unless the authorization is terminated or revoked.  Performed at [facility], [address] [place], [place] [address]   Blood Culture (routine x 2)     Status: None   Collection Time: 10/08/2021  3:53 PM   Specimen: BLOOD RIGHT ARM  Result Value Ref Range Status   Specimen Description BLOOD RIGHT ARM  Final   Special Requests   Final    BOTTLES DRAWN AEROBIC AND ANAEROBIC Blood Culture adequate volume   Culture   Final    NO GROWTH 5 DAYS Performed at [facility], [address], [place], [place] [address]    Report Status 11/05/2021 FINAL  Final  Blood Culture (routine x 2)     Status: Abnormal   Collection Time: 10/22/2021  3:54 PM   Specimen: Right Antecubital; Blood  Result Value Ref Range Status   Specimen Description   Final    RIGHT ANTECUBITAL Performed at [facility], [address], [place], [place] [address]    Special Requests   Final    BOTTLES DRAWN AEROBIC AND ANAEROBIC Blood Culture results may not be optimal due to an excessive volume of blood received in culture bottles Performed at [facility], [address], [place], [place] [address]    Culture  Setup Time   Final    GRAM NEGATIVE RODS Gram Stain Report Called to,Read Back By and Verified With:  J. LONG @ 1512 BY STEPHTR 11/01/21 CRITICAL RESULT CALLED TO, READ BACK BY AND VERIFIED WITH: E PREYER,RN'@0125'$  0/30/23 Martin    Culture (A)  Final    BACTEROIDES FRAGILIS BETA LACTAMASE POSITIVE Performed at [facility], [place] [address], [place], [place] [address]    Report Status 11/03/2021 FINAL  Final  Blood Culture ID Panel (Reflexed)     Status: Abnormal   Collection Time: 11/03/2021  3:54 PM  Result Value Ref Range Status   Enterococcus faecalis NOT DETECTED NOT DETECTED Final   Enterococcus Faecium NOT DETECTED NOT DETECTED Final   Listeria monocytogenes NOT DETECTED NOT DETECTED Final    Staphylococcus species NOT DETECTED  NOT DETECTED Final   Staphylococcus aureus (BCID) NOT DETECTED NOT DETECTED Final   Staphylococcus epidermidis NOT DETECTED NOT DETECTED Final   Staphylococcus lugdunensis NOT DETECTED NOT DETECTED Final   Streptococcus species NOT DETECTED NOT DETECTED Final   Streptococcus agalactiae NOT DETECTED NOT DETECTED Final   Streptococcus pneumoniae NOT DETECTED NOT DETECTED Final   Streptococcus pyogenes NOT DETECTED NOT DETECTED Final   A.calcoaceticus-baumannii NOT DETECTED NOT DETECTED Final   Bacteroides fragilis DETECTED (A) NOT DETECTED Final    Comment: CRITICAL RESULT CALLED TO, READ BACK BY AND VERIFIED WITH: E PREYER,RN'@0125'$  11/02/21 [place]    Enterobacterales NOT DETECTED NOT DETECTED Final   Enterobacter cloacae complex NOT DETECTED NOT DETECTED Final   Escherichia coli NOT DETECTED NOT DETECTED Final   Klebsiella aerogenes NOT DETECTED NOT DETECTED Final   Klebsiella oxytoca NOT DETECTED NOT DETECTED Final   Klebsiella pneumoniae NOT DETECTED NOT DETECTED Final   Proteus species NOT DETECTED NOT DETECTED Final   Salmonella species NOT DETECTED NOT DETECTED Final   Serratia marcescens NOT DETECTED NOT DETECTED Final   Haemophilus influenzae NOT DETECTED NOT DETECTED Final   Neisseria meningitidis NOT DETECTED NOT DETECTED Final   Pseudomonas aeruginosa NOT DETECTED NOT DETECTED Final   Stenotrophomonas maltophilia NOT DETECTED NOT DETECTED Final   Candida albicans NOT DETECTED NOT DETECTED Final   Candida auris NOT DETECTED NOT DETECTED Final   Candida glabrata NOT DETECTED NOT DETECTED Final   Candida krusei NOT DETECTED NOT DETECTED Final   Candida parapsilosis NOT DETECTED NOT DETECTED Final   Candida tropicalis NOT DETECTED NOT DETECTED Final   Cryptococcus neoformans/gattii NOT DETECTED NOT DETECTED Final    Comment: Performed at [facility], [place] [address], [place], [place] [address]  Culture, body fluid w Gram Stain-bottle      Status: None   Collection Time: 11/02/21  8:54 AM   Specimen: Pleura  Result Value Ref Range Status   Specimen Description PLEURAL BOTTLES DRAWN AEROBIC AND ANAEROBIC  Final   Special Requests Blood Culture adequate volume  Final   Culture   Final    NO GROWTH 5 DAYS Performed at [facility], [address], [place], [place] [address]    Report Status 11/07/2021 FINAL  Final  Gram stain     Status: None   Collection Time: 11/02/21  8:54 AM   Specimen: Pleura  Result Value Ref Range Status   Specimen Description PLEURAL  Final   Special Requests NONE  Final   Gram Stain   Final    NO ORGANISMS SEEN WBC PRESENT, PREDOMINANTLY PMN CYTOSPIN SMEAR Performed at [facility], [address] [place], [place] [address]    Report Status 11/02/2021 FINAL  Final  MRSA Next Gen by PCR, Nasal     Status: None   Collection Time: 11/09/21  3:32 AM   Specimen: Nasal Mucosa; Nasal Swab  Result Value Ref Range Status   MRSA by PCR Next Gen NOT DETECTED NOT DETECTED Final    Comment: (NOTE) The GeneXpert MRSA Assay (FDA approved for NASAL specimens only), is one component of a comprehensive MRSA colonization surveillance program. It is not intended to diagnose MRSA infection nor to guide or monitor treatment for MRSA infections. Test performance is not FDA approved in patients less than 88 years old. Performed at [facility], [place] [address], [place], [place] [address]      Janene Madeira, MSN, NP-C [facility] [place]  [email or web address] Pager: [phone] Office: [phone] [facility] Main Line: [place] Communication Welcome

## 2021-11-09 NOTE — Progress Notes (Signed)
   11/09/21 1246  Vitals  Temp 98 F (36.7 C)  Pulse Rate 83  Resp 13  BP (!) 81/46  SpO2 100 %  O2 Device Nasal Cannula  Oxygen Therapy  O2 Flow Rate (L/min) 2 L/min  Patient Activity (if Appropriate) In bed  Pulse Oximetry Type Continuous  Post Treatment  Dialyzer Clearance Lightly streaked  Duration of HD Treatment -hour(s) 3.5 hour(s)  Tolerated HD Treatment Yes  Post-Hemodialysis Comments Tolerated HD well, Pt bp low at the end of tx, but stable now  AVG/AVF Arterial Site Held (minutes) 10 minutes  AVG/AVF Venous Site Held (minutes) 10 minutes     Received patient in bed to unit.  Alert and oriented.  Informed consent signed and in chart.     Patient tolerated well.  Transported back to the room  Alert, without acute distress.  Hand-off given to patient's nurse.   Access used: LAVF Access issues: none  Total UF removed: 2.8L Medication(s) given: calcitriol, midodrine    [facility]

## 2021-11-09 NOTE — Progress Notes (Signed)
PROGRESS NOTE    Paul Gonzalez  QIH:[id number] DOB: 02-09-1965 DOA: 11/02/2021 PCP: Coral Spikes, DO   Brief Narrative:  56 y.o. male with medical history significant of Hypertension, hyperlipidemia, ESRD on HD (MWF), anemia of chronic disease, diabetic retinopathy with vision loss who presents to the emergency department due to worsening shortness of breath that has been ongoing for about 2 weeks, this was associated with nonproductive cough.  He  also complained of bilateral diabetic foot ulcers and states that he has not been able to get an appointment with a podiatrist.  He denies chest pain, fever, chills, nausea, vomiting, diarrhea or constipation.   ED Course:  In the emergency department, he was intermittently tachypneic, BP was soft at 105/52, other vital signs were within normal range.  Work-up in the ED showed leukocytosis, normocytic anemia, BMP showed sodium 135, potassium 4.9, chloride 93, bicarb 25, glucose 173, BUN 45, creatinine 5.14, albumin 3.3, AST 57, ALT 47, total bilirubin 1.6.  Anion gap 17, lactic acid 2.4 > 2.8, BNP > 4,500.  Influenza A, B, SARS coronavirus 2 was negative.   Right foot x-ray showed no fracture or dislocation of the right foot.  No radiographic findings of osteomyelitis.  Soft tissue wound over the medial great toe.  Soft tissue edema of the forefoot.  Chest x-ray showed cardiomegaly without failure.  Bilateral pleural effusion and associated airspace disease, likely atelectasis, right greater than left.  He was treated with IV ceftriaxone, vancomycin and azithromycin.  Midodrine was given.  IV hydration was provided.  Hospitalist was asked to admit patient for further evaluation and management.  Orthopedic surgeon (Dr. Erlinda Hong) was consulted and patient will be seen when he arrives at [facility].    **Interim History Orthopedic surgery evaluated and recommended ABIs and also recommended vascular evaluation.  Vascular surgery is now evaluating and plan is for  aortogram with lower extremity arteriogram.  Nephrology is also been consulted and plan is for dialysis later today.   11/30/2021.  Patient was supposed to go for a TEE today given his bacteremia but this was canceled due to his hypotension.  Vascular surgery feels that he is optimized from the left lower extremity standpoint as well as he has inline flow with only single-vessel peroneal runoff.  They are recommending discussing aspirin and Plavix as well as statin daily and recommending following up in a month.  No allergies taking the patient back to dialysis again today and he remains on IV Zosyn.  May need to broaden his antibiotics given that his WBC remains elevated but slowly trending down.   11/07/2021: Patient's WBC is trending back up again and now have asked ID to weigh in given his current condition.  Patient continues to complain of some shortness breath and the TEE is being rescheduled for Monday, 11/09/2021.  Nephrology recommending next dialysis session on Monday and has chest x-ray showing bilateral effusions and may be laying which makes the x-ray look worse.  Has a poor prognosis overall.  We will need to follow-up on orthopedic surgery evaluation and recommendations as well.   11/08/2021: Orthopedic surgery recommends no further intervention at this point in time and recommends discharging home and letting the foot declare itself for outpatient elective surgical intervention.  Patient is to go for TEE and was to be done today buit now pushed to Tuesday or Wednesday.  He remains hypotensive and a little drowsy today.  We will need to continue dialysis and watch him carefully.  ID  recommending at least 2 weeks of antibiotics for the bacteremia and today have narrowed from Zosyn to Unasyn  11/09/21: Underwent dialysis. Ortho re-evaluated and recommending improvement of cardiac status. TEE to be done Tuesday or Wednesday. ID recommending continuing current Abx. BP remains low. Given poor  progonosis will consult Palliative Care. Continues to be SOB  Assessment and Plan: Sepsis secondary to B Fragilis Bacteremia and concomitant Gangrene Right Toe with Cellulitis -Patient will likely require amputation of gangrenous tissue but Ortho feels does not need to be inpatient given that he has been optimized  -Continue antibiotics for B fragilis coverage with IV zosyn and changed to IV Unasyn by ID -Aortogram with LE arteriorgram done  -[name]'s ordered and showed and reveals Brady vessels on the right with biphasic waveforms and dampened monophasic waveforms on the left.  -Patient undergoing an aortogram with lower extremity arteriogram done given that there is tissue loss and he had a right posterior tibial artery angioplasty and right mid to distal SFV drug-coated balloon angioplasty and has been optimized in the right lower extremity after SFA and posterior tibial intervention with inline flow down the right lower extremity with via the posterior tibial; patient was loaded with Plavix and aspirin and statin and they feel that if the right foot wounds fail to heal he would be a candidate for retrograde right AT access -WBC has gone from 13.0 -> 16.6 x2 -> 15.0 -> 18.0 -> 16.4 and is now 16.7 -Orthopedic Surgery consulted and will follow up after Vascular Surgery evaluation -Vascular feels that he has been optimized from their standpoint -Awaiting Ortho recc's and recommending the patient to be D/C'd home with the foot to declare itself with outpatient Amputation however given his multiple medical comorbidities may need to have this done inpatient and per the patient orthopedic surgery reevaluated and recommending that his blood pressure be improved prior to amputation   New finding of Cardiomyopathy  Acute Systolic CHF/HFrEF Volume overload -Fluid management with hemodialysis  -Midodrine dose increased due to soft BPs limiting volume removal for HD -Echo with findings of change in EF now down  to 20-25% with severely decreased function of the left ventricle demonstrates global hypokinesis and left ventricular diastolic parameters consistent with grade 3 diastolic dysfunction and restricted as well as the right ventricular systolic function is mildly reduced; patient's aortic valve regurgitation is moderate to severe and moderate to severe aortic valve stenosis was noted with a gradient of 36 mmHg metrics despite decrease stroke-volume index -BNP was >4,500 and he remains volume overloaded  -Inpatient cardiology consultation requested -Pt may require TEE to better assess for valvular disease and cardiology recommending and planning for this later this week -Appreciate cardiology team recs; they will follow him at [facility] [address]  -Volume maintenance is via dialysis and he underwent thoracentesis of the right pleural effusion on 11/02/2021 with removal 1.5 L of fluid. -He is not on any GDMT due to hypotension requiring midodrine and ESRD -Cardiology is unclear whether cardiomyopathy secondary to progressive aortic valve disease but they would not pursue any invasive work-up at this time given his acute bacteremia; TEE canceled and rescheduled for Monday 11/09/21 however was not able to be done today so will be rescheduled for either Tuesday or Wednesday -Getting volume maintenance by dialysis if tolerated   B Fragilis Bacteremia -My colleague discussed with Pharmacist and the patient waschanged to IV Zosyn for better coverage -Patient remains on antibiotic coverage with IV Zosyn and WBC is 16.7 today -Cardiology recommending TEE  given his bacteremia and has echo that was different from the one done in March and there unable to perform it at the same time as a peripheral arteriogram so they are planning for this on Friday, 11/05/2021 however this had to be canceled due to his hypotension -Will consult ID for further evaluation and recommendations and they have de-escalated IV Zosyn to IV Unasyn and will  continue and recommending following TEE and Ortho recommendations   ESRD on HD Elevated anion gap metabolic Acidosis -He is on schedule with MWF HD treatments -Nephrology was consulted regarding inpatient HD treatments -Electrolytes are stable at this time and Na+ improved from 132 -> 137 -> 132 and further dropped to 128 but K+ is now 4.3 -Patient has a slight metabolic acidosis with a CO2 of 25, anion gap of 17, chloride level of 86 -Patient's BUN/Cr went from 63/6.21 -> 40/5.04 -> 39/4.60 -> 62/7.37 -> 33/4.58 -> 27/4.58 -> 38/5.57 and is now 45/6.50 -Nephrology team has been consulted and plan is for Dialysis again on Monday    Hypokalemia Hypomagnesemia Hyponatremia -As above -Patient's K+ is now 4.3 and magnesium level is now 1.9 -Na+ is now 128 and likley 2/2 to Hypervolemia  -Continue to Monitor and Trend and likely to be repleted in dialysis -Repeat CMP in the AM    Lactic Acidosis -Last Lactate down to 2.1 now   Bilateral Pleural Effusions R>L -Thoracentesis ordered for large right effusion, completed 10/30 with 1.5L removed -Continue supportive care and volume management with HD for now -Not currently with any symptoms of respiratory distress -Manage as per dialysis and may need a repeat thoracentesis -CXR yesterday AM done and showed "Increasing central vascular congestion. Bilateral pleural effusions which appear worsened from the prior study but may be in part due to patient positioning." -Repeat in the morning given that he is going for dialysis today   Leukocytosis  -Continue to monitor CBC/diff as response to treating infection -Follow-up blood cultures -Patient's WBC went from 15.8 -> 12.9 -> 13.0 -> 16.6 x2 -> 15.0 -> 18.0 -> 16.4 today is 16.7 -Continue to Monitor and Trend and repeat CBC in the AM  -We will get infectious disease involvement given his continued elevation in WBC   Diabetes mellitus with neurological, renal, vascular complications -Continue  very sensitive NovoLog sliding scale before meals and at bedtime SSI coverage, daily lantus 8 units for basal coverage, frequent CBG monitoring -Continue monitor CBGs per protocol and CBGs ranging from 156   Chronic Hypotension  -Had resumed Midodrine, dose increased by nephrology due to soft BPs limiting HD volume removal and also prevented him from getting a TEE -Continue to monitor blood pressures per protocol -Last blood pressure reading is on the softer side at 87/48    Normocytic Anemia -Patient's Hgb/Hct went from 11.2/35.2 -> 8.6/27.1 -> 10.9/34.7 -> 10.7/34.0 -> 9.8/31.0 -> 11.0/33.7 and is now 10.8/34.4 -Check Anemia Panel in the AM  -Continue to Monitor for S/Sx of Bleeding; No overt bleeding noted -Repeat CBC in the AM    Thrombocytopenia -Patient's Plaletet Count went from Platelet went from 126 -> 119 -> 150 -> 161 -> 152 -> 186 -> 196 and is improved to 222 -Continue to Monitor for S/Sx of Bleeding; No overt bleeding noted -Repeat CBC in the AM    Hypoalbuminemia -Patient's Albumin Level is now 2.2 yesterday and today now 2.3 -Continue to Monitor and Trend -Repeat CMP in the AM    Hyponatremia -Patient's sodium is now 132 and  dropped further to 128 -Likely to be repleted in dialysis and corrected there -Continue monitor and trend and repeat CMP in a.m. -Repeat CMP in the AM   DVT prophylaxis: SCDs Start: 11/02/2021 2114    Code Status: Full Code Family Communication: Discussed with the wife at bedside  Disposition Plan:  Level of care: Telemetry Medical Status is: Inpatient Remains inpatient appropriate because: Needs further clinical improvement and clearance by specialists as he is going to get a TEE on either Tuesday or Wednesday   Consultants:  Nephrology Cardiology Orthopedic Surgery Vascular Surgery Infectious Diseases  Palliative Care Medicine   Procedures:  As delineated as above    ECHOCARDIOGRAM IMPRESSIONS     1. Left ventricular ejection  fraction, by estimation, is 25 to 30%. The  left ventricle has severely decreased function. The left ventricle  demonstrates global hypokinesis. Left ventricular diastolic parameters are  consistent with Grade III diastolic  dysfunction (restrictive).   2. Right ventricular systolic function is mildly reduced. The right  ventricular size is severely enlarged. There is moderately elevated  pulmonary artery systolic pressure. The estimated right ventricular  systolic pressure is 71.6 mmHg.   3. Left atrial size was severely dilated.   4. Right atrial size was mildly dilated.   5. A small pericardial effusion is present. The pericardial effusion is  circumferential.   6. The mitral valve is abnormal. Mild mitral valve regurgitation. Mild  mitral stenosis. Moderate mitral annular calcification.   7. The aortic valve is calcified. Aortic valve regurgitation is moderate  to severe. Moderate to severe aortic valve stenosis. GRadient of 36 mm Hg  at max despite decreased strok volume index. DVI 0.20.   Comparison(s): Significant changes from prior. Atempting to reach primary  team.   FINDINGS   Left Ventricle: Left ventricular ejection fraction, by estimation, is 25  to 30%. The left ventricle has severely decreased function. The left  ventricle demonstrates global hypokinesis. The left ventricular internal  cavity size was normal in size. There  is no left ventricular hypertrophy. Left ventricular diastolic parameters  are consistent with Grade III diastolic dysfunction (restrictive).   Right Ventricle: The right ventricular size is severely enlarged. No  increase in right ventricular wall thickness. Right ventricular systolic  function is mildly reduced. There is moderately elevated pulmonary artery  systolic pressure. The tricuspid  regurgitant velocity is 2.82 m/s, and with an assumed right atrial  pressure of 15 mmHg, the estimated right ventricular systolic pressure is  96.7 mmHg.    Left Atrium: Left atrial size was severely dilated.   Right Atrium: Right atrial size was mildly dilated.   Pericardium: A small pericardial effusion is present. The pericardial  effusion is circumferential. Presence of epicardial fat layer.   Mitral Valve: The mitral valve is abnormal. Moderate mitral annular  calcification. Mild mitral valve regurgitation. Mild mitral valve  stenosis.   Tricuspid Valve: The tricuspid valve is normal in structure. Tricuspid  valve regurgitation is mild . No evidence of tricuspid stenosis.   Aortic Valve: The aortic valve is calcified. Aortic valve regurgitation is  moderate to severe. Aortic regurgitation PHT measures 160 msec. Moderate  to severe aortic stenosis is present. Aortic valve mean gradient measures  36.0 mmHg. Aortic valve peak  gradient measures 59.9 mmHg. Aortic valve area, by VTI measures 0.54 cm.   Pulmonic Valve: The pulmonic valve was grossly normal. Pulmonic valve  regurgitation is trivial. No evidence of pulmonic stenosis.   Aorta: The aortic root is normal  in size and structure.   IAS/Shunts: No atrial level shunt detected by color flow Doppler.     LEFT VENTRICLE  PLAX 2D  LVIDd:         5.50 cm  LVIDs:         4.70 cm  LV PW:         1.10 cm  LV IVS:        1.00 cm  LVOT diam:     1.90 cm  LV SV:         46  LV SV Index:   24  LVOT Area:     2.84 cm    LV Volumes (MOD)  LV vol d, MOD A2C: 177.0 ml  LV vol d, MOD A4C: 211.0 ml  LV vol s, MOD A2C: 116.0 ml  LV vol s, MOD A4C: 133.0 ml  LV SV MOD A2C:     61.0 ml  LV SV MOD A4C:     211.0 ml  LV SV MOD BP:      73.3 ml   RIGHT VENTRICLE  RV S prime:     7.62 cm/s  TAPSE (M-mode): 1.5 cm   LEFT ATRIUM              Index        RIGHT ATRIUM           Index  LA diam:        5.30 cm  2.75 cm/m   RA Area:     21.40 cm  LA Vol (A2C):   148.0 ml 76.76 ml/m  RA Volume:   71.10 ml  36.88 ml/m  LA Vol (A4C):   143.0 ml 74.17 ml/m  LA Biplane Vol: 148.0 ml  76.76 ml/m   AORTIC VALVE  AV Area (Vmax):    0.55 cm  AV Area (Vmean):   0.64 cm  AV Area (VTI):     0.54 cm  AV Vmax:           387.00 cm/s  AV Vmean:          232.500 cm/s  AV VTI:            0.863 m  AV Peak Grad:      59.9 mmHg  AV Mean Grad:      36.0 mmHg  LVOT Vmax:         75.70 cm/s  LVOT Vmean:        52.500 cm/s  LVOT VTI:          0.164 m  LVOT/AV VTI ratio: 0.19  AI PHT:            160 msec    AORTA  Ao Root diam: 3.10 cm   MITRAL VALVE                TRICUSPID VALVE  MV Area (PHT): 6.17 cm     TR Peak grad:   31.8 mmHg  MV Decel Time: 123 msec     TR Vmax:        282.00 cm/s  MR Peak grad: 81.0 mmHg  MR Mean grad: 49.0 mmHg     SHUNTS  MR Vmax:      450.00 cm/s   Systemic VTI:  0.16 m  MR Vmean:     322.0 cm/s    Systemic Diam: 1.90 cm  MV E velocity: 114.00 cm/s  MV A velocity: 34.70 cm/s  MV E/A ratio:  3.29  Procedure Performed: 1.  Ultrasound-guided access left common femoral artery 2.  Aortogram with catheter selection of aorta 3.  Bilateral lower extremity arteriogram with selection of third order branches in the right lower extremity 4.  Right posterior tibial artery angioplasty (2.5 mm x 150 mm [place]) 5.  Right mid to distal SFA drug-coated balloon angioplasty (5 mm x 150 mm drug-coated impact) 6.  94 minutes of monitored moderate conscious sedation time 7.  Mynx closure of the left common femoral artery  Antimicrobials:  Anti-infectives (From admission, onward)    Start     Dose/Rate Route Frequency Ordered Stop   11/08/21 1500  ampicillin-sulbactam (UNASYN) 1.5 g in sodium chloride 0.9 % 100 mL IVPB        1.5 g 200 mL/hr over 30 Minutes Intravenous Every 12 hours 11/08/21 0922     11/04/21 2300  piperacillin-tazobactam (ZOSYN) IVPB 2.25 g  Status:  Discontinued        2.25 g 100 mL/hr over 30 Minutes Intravenous Every 8 hours 11/03/21 2305 11/04/21 0004   11/04/21 0015  piperacillin-tazobactam (ZOSYN) IVPB 2.25 g  Status:   Discontinued        2.25 g 100 mL/hr over 30 Minutes Intravenous Every 8 hours 11/04/21 0005 11/08/21 0852   11/02/21 1800  ceFEPIme (MAXIPIME) 2 g in sodium chloride 0.9 % 100 mL IVPB  Status:  Discontinued        2 g 200 mL/hr over 30 Minutes Intravenous Every M-W-F (1800) 10/13/2021 2119 11/02/21 0947   11/02/21 1200  vancomycin (VANCOREADY) IVPB 750 mg/150 mL  Status:  Discontinued        750 mg 150 mL/hr over 60 Minutes Intravenous Every M-W-F (Hemodialysis) 10/18/2021 2119 11/02/21 0948   11/02/21 1200  piperacillin-tazobactam (ZOSYN) IVPB 2.25 g  Status:  Discontinued        2.25 g 100 mL/hr over 30 Minutes Intravenous Every 8 hours 11/02/21 0952 11/02/21 1014   11/02/21 1200  piperacillin-tazobactam (ZOSYN) 2.25 g in sodium chloride 0.9 % 50 mL IVPB  Status:  Discontinued        2.25 g 100 mL/hr over 30 Minutes Intravenous Every 8 hours 11/02/21 1014 11/03/21 2305   11/03/2021 2130  vancomycin (VANCOREADY) IVPB 500 mg/100 mL        500 mg 100 mL/hr over 60 Minutes Intravenous  Once 10/14/2021 2119 11/01/21 0152   10/13/2021 2100  vancomycin (VANCOREADY) IVPB 1500 mg/300 mL  Status:  Discontinued        1,500 mg 150 mL/hr over 120 Minutes Intravenous  Once 11/03/2021 2057 10/07/2021 2119   10/29/2021 2100  ceFEPIme (MAXIPIME) 2 g in sodium chloride 0.9 % 100 mL IVPB        2 g 200 mL/hr over 30 Minutes Intravenous  Once 10/21/2021 2057 11/01/21 0152   10/23/2021 1630  vancomycin (VANCOCIN) IVPB 1000 mg/200 mL premix        1,000 mg 200 mL/hr over 60 Minutes Intravenous  Once 10/12/2021 1628 10/14/2021 1818   10/14/2021 1530  cefTRIAXone (ROCEPHIN) 2 g in sodium chloride 0.9 % 100 mL IVPB  Status:  Discontinued        2 g 200 mL/hr over 30 Minutes Intravenous Every 24 hours 10/19/2021 1522 10/23/2021 2119   11/01/2021 1530  azithromycin (ZITHROMAX) 500 mg in sodium chloride 0.9 % 250 mL IVPB  Status:  Discontinued        500 mg 250 mL/hr over 60 Minutes Intravenous Every 24 hours 10/20/2021 1522 11/02/21  1610        Subjective: And examined at bedside and he was doing okay and still did not know when his TEE was.  Had dialysis today and states that he got over a liter off.  Continues to have pain in his toe and states that the orthopedic surgeon, states that his toe will need to be amputated once his blood pressure improves.  Denies any lightheadedness or dizziness.  Complaining of some buttock redness and asking for some Desitin cream.  No other concerns or complaints at this time.  Objective: Vitals:   11/09/21 1230 11/09/21 1246 11/09/21 1300 11/09/21 1304  BP: (!) 77/38 (!) 81/46 (!) 87/48   Pulse: 82 83 93 89  Resp:  13    Temp:  98 F (36.7 C)    TempSrc:  Oral    SpO2: 100% 100% 100% 100%  Weight:   84.2 kg   Height:        Intake/Output Summary (Last 24 hours) at 11/09/2021 1807 Last data filed at 11/09/2021 1637 Gross per 24 hour  Intake 1197 ml  Output 2800 ml  Net -1603 ml   Filed Weights   11/09/21 0316 11/09/21 0853 11/09/21 1300  Weight: 84.7 kg 86.1 kg 84.2 kg   Examination: Physical Exam:  Constitutional: WN/WD overweight chronically ill-appearing Caucasian male who appears a little uncomfortable but calm Respiratory: Diminished to auscultation bilaterally with coarse breath sounds, no wheezing, rales, rhonchi or crackles. Normal respiratory effort and patient is not tachypenic. No accessory muscle use.  Unlabored breathing Cardiovascular: RRR, has a loud 3 out of 6 systolic murmur.  Has some lower extremity edema Abdomen: Soft, non-tender, non-distended. Bowel sounds positive.  GU: Deferred. Musculoskeletal: No clubbing / cyanosis of digits/nails. No joint deformity upper and lower extremities.  Skin: Tissue loss noted in his lower extremities with dry gangrene specifically his big right toe Neurologic: CN 2-12 grossly intact with no focal deficits. Romberg sign and cerebellar reflexes not assessed.  Psychiatric: Normal judgment and insight.  A little  anxious  Data Reviewed: I have personally reviewed following labs and imaging studies  CBC: Recent Labs  Lab 11/05/21 1058 11/23/2021 0034 11/07/21 0054 11/08/21 0054 11/09/21 0030  WBC 16.6* 15.0* 18.0* 16.4* 16.7*  NEUTROABS 14.7* 13.7* 16.2* 14.3* 14.7*  HGB 10.7* 9.8* 11.0* 10.8* 10.8*  HCT 34.0* 31.0* 33.7* 33.2* 34.4*  MCV 89.0 90.1 87.5 88.3 90.5  PLT 161 152 186 196 960   Basic Metabolic Panel: Recent Labs  Lab 11/05/21 0045 11/30/2021 0034 11/07/21 0054 11/08/21 0054 11/09/21 0030  NA 133* 133* 132* 128* 128*  K 4.9 3.3* 3.6 3.9 4.3  CL 93* 88* 95* 87* 86*  CO2 23 21* '25 24 25  '$ GLUCOSE 217* 200* 203* 215* 183*  BUN 62* 33* 27* 38* 45*  CREATININE 7.37* 4.58* 4.58* 5.57* 6.50*  CALCIUM 9.4 7.9* 9.1 9.1 9.2  MG 2.2 1.6* 1.8 1.8 1.9  PHOS 6.5* 4.5 4.3 5.5* 6.8*   GFR: Estimated Creatinine Clearance: 12.7 mL/min (A) (by C-G formula based on SCr of 6.5 mg/dL (H)). Liver Function Tests: Recent Labs  Lab 11/05/21 0045 11/22/2021 0034 11/07/21 0054 11/08/21 0054 11/09/21 0030  AST '27 25 19 18 19  '$ ALT '29 23 23 21 19  '$ ALKPHOS 106 88 112 114 118  BILITOT 1.0 1.3* 1.2 1.2 0.9  PROT 6.1* 8.3* 6.2* 6.0* 6.2*  ALBUMIN 2.4* 5.5* 2.4* 2.2* 2.3*   No results for input(s): "LIPASE", "AMYLASE" in the last 168  hours. No results for input(s): "AMMONIA" in the last 168 hours. Coagulation Profile: No results for input(s): "INR", "PROTIME" in the last 168 hours. Cardiac Enzymes: No results for input(s): "CKTOTAL", "CKMB", "CKMBINDEX", "TROPONINI" in the last 168 hours. BNP (last 3 results) No results for input(s): "PROBNP" in the last 8760 hours. HbA1C: No results for input(s): "HGBA1C" in the last 72 hours. CBG: Recent Labs  Lab 11/08/21 1548 11/08/21 2056 11/09/21 0318 11/09/21 0556 11/09/21 1635  GLUCAP 182* 173* 177* 156* 192*   Lipid Profile: No results for input(s): "CHOL", "HDL", "LDLCALC", "TRIG", "CHOLHDL", "LDLDIRECT" in the last 72 hours. Thyroid  Function Tests: No results for input(s): "TSH", "T4TOTAL", "FREET4", "T3FREE", "THYROIDAB" in the last 72 hours. Anemia Panel: No results for input(s): "VITAMINB12", "FOLATE", "FERRITIN", "TIBC", "IRON", "RETICCTPCT" in the last 72 hours. Sepsis Labs: No results for input(s): "PROCALCITON", "LATICACIDVEN" in the last 168 hours.  Recent Results (from the past 240 hour(s))  Resp Panel by RT-PCR (Flu A&B, Covid) Anterior Nasal Swab     Status: None   Collection Time: 10/25/2021  3:22 PM   Specimen: Anterior Nasal Swab  Result Value Ref Range Status   SARS Coronavirus 2 by RT PCR NEGATIVE NEGATIVE Final    Comment: (NOTE) SARS-CoV-2 target nucleic acids are NOT DETECTED.  The SARS-CoV-2 RNA is generally detectable in upper respiratory specimens during the acute phase of infection. The lowest concentration of SARS-CoV-2 viral copies this assay can detect is 138 copies/mL. A negative result does not preclude SARS-Cov-2 infection and should not be used as the sole basis for treatment or other patient management decisions. A negative result may occur with  improper specimen collection/handling, submission of specimen other than nasopharyngeal swab, presence of viral mutation(s) within the areas targeted by this assay, and inadequate number of viral copies(<138 copies/mL). A negative result must be combined with clinical observations, patient history, and epidemiological information. The expected result is Negative.  Fact Sheet for Patients:  EntrepreneurPulse.com.au  Fact Sheet for Healthcare Providers:  IncredibleEmployment.be  This test is no t yet approved or cleared by the [place] FDA and  has been authorized for detection and/or diagnosis of SARS-CoV-2 by FDA under an Emergency Use Authorization (EUA). This EUA will remain  in effect (meaning this test can be used) for the duration of the COVID-19 declaration under Section 564(b)(1) of the  Act, 21 U.S.C.section 360bbb-3(b)(1), unless the authorization is terminated  or revoked sooner.       Influenza A by PCR NEGATIVE NEGATIVE Final   Influenza B by PCR NEGATIVE NEGATIVE Final    Comment: (NOTE) The Xpert Xpress SARS-CoV-2/FLU/RSV plus assay is intended as an aid in the diagnosis of influenza from Nasopharyngeal swab specimens and should not be used as a sole basis for treatment. Nasal washings and aspirates are unacceptable for Xpert Xpress SARS-CoV-2/FLU/RSV testing.  Fact Sheet for Patients: EntrepreneurPulse.com.au  Fact Sheet for Healthcare Providers: IncredibleEmployment.be  This test is not yet approved or cleared by the [place] FDA and has been authorized for detection and/or diagnosis of SARS-CoV-2 by FDA under an Emergency Use Authorization (EUA). This EUA will remain in effect (meaning this test can be used) for the duration of the COVID-19 declaration under Section 564(b)(1) of the Act, 21 U.S.C. section 360bbb-3(b)(1), unless the authorization is terminated or revoked.  Performed at [facility], [address] [place], [place] [address]   Blood Culture (routine x 2)     Status: None   Collection Time: 10/06/2021  3:53  PM   Specimen: BLOOD RIGHT ARM  Result Value Ref Range Status   Specimen Description BLOOD RIGHT ARM  Final   Special Requests   Final    BOTTLES DRAWN AEROBIC AND ANAEROBIC Blood Culture adequate volume   Culture   Final    NO GROWTH 5 DAYS Performed at [facility], [address], [place], [place] [address]    Report Status 11/05/2021 FINAL  Final  Blood Culture (routine x 2)     Status: Abnormal   Collection Time: 10/18/2021  3:54 PM   Specimen: Right Antecubital; Blood  Result Value Ref Range Status   Specimen Description   Final    RIGHT ANTECUBITAL Performed at [facility], [address], [place], [place] [address]    Special Requests   Final    BOTTLES DRAWN AEROBIC AND  ANAEROBIC Blood Culture results may not be optimal due to an excessive volume of blood received in culture bottles Performed at [facility], [address] [place], [place] [address]    Culture  Setup Time   Final    GRAM NEGATIVE RODS Gram Stain Report Called to,Read Back By and Verified With:  J. LONG @ 1512 BY STEPHTR 11/01/21 CRITICAL RESULT CALLED TO, READ BACK BY AND VERIFIED WITH: E PREYER,RN'@0125'$  0/30/23 [place]    Culture (A)  Final    BACTEROIDES FRAGILIS BETA LACTAMASE POSITIVE Performed at [facility], [place] [address], [place], [place] [address]    Report Status 11/03/2021 FINAL  Final  Blood Culture ID Panel (Reflexed)     Status: Abnormal   Collection Time: 10/22/2021  3:54 PM  Result Value Ref Range Status   Enterococcus faecalis NOT DETECTED NOT DETECTED Final   Enterococcus Faecium NOT DETECTED NOT DETECTED Final   Listeria monocytogenes NOT DETECTED NOT DETECTED Final   Staphylococcus species NOT DETECTED NOT DETECTED Final   Staphylococcus aureus (BCID) NOT DETECTED NOT DETECTED Final   Staphylococcus epidermidis NOT DETECTED NOT DETECTED Final   Staphylococcus lugdunensis NOT DETECTED NOT DETECTED Final   Streptococcus species NOT DETECTED NOT DETECTED Final   Streptococcus agalactiae NOT DETECTED NOT DETECTED Final   Streptococcus pneumoniae NOT DETECTED NOT DETECTED Final   Streptococcus pyogenes NOT DETECTED NOT DETECTED Final   A.calcoaceticus-baumannii NOT DETECTED NOT DETECTED Final   Bacteroides fragilis DETECTED (A) NOT DETECTED Final    Comment: CRITICAL RESULT CALLED TO, READ BACK BY AND VERIFIED WITH: E PREYER,RN'@0125'$  11/02/21 [place]    Enterobacterales NOT DETECTED NOT DETECTED Final   Enterobacter cloacae complex NOT DETECTED NOT DETECTED Final   Escherichia coli NOT DETECTED NOT DETECTED Final   Klebsiella aerogenes NOT DETECTED NOT DETECTED Final   Klebsiella oxytoca NOT DETECTED NOT DETECTED Final   Klebsiella pneumoniae NOT DETECTED NOT DETECTED  Final   Proteus species NOT DETECTED NOT DETECTED Final   Salmonella species NOT DETECTED NOT DETECTED Final   Serratia marcescens NOT DETECTED NOT DETECTED Final   Haemophilus influenzae NOT DETECTED NOT DETECTED Final   Neisseria meningitidis NOT DETECTED NOT DETECTED Final   Pseudomonas aeruginosa NOT DETECTED NOT DETECTED Final   Stenotrophomonas maltophilia NOT DETECTED NOT DETECTED Final   Candida albicans NOT DETECTED NOT DETECTED Final   Candida auris NOT DETECTED NOT DETECTED Final   Candida glabrata NOT DETECTED NOT DETECTED Final   Candida krusei NOT DETECTED NOT DETECTED Final   Candida parapsilosis NOT DETECTED NOT DETECTED Final   Candida tropicalis NOT DETECTED NOT DETECTED Final   Cryptococcus neoformans/gattii NOT DETECTED  NOT DETECTED Final    Comment: Performed at [facility], [place] [address] [place], [place] [address]  Culture, body fluid w Gram Stain-bottle     Status: None   Collection Time: 11/02/21  8:54 AM   Specimen: Pleura  Result Value Ref Range Status   Specimen Description PLEURAL BOTTLES DRAWN AEROBIC AND ANAEROBIC  Final   Special Requests Blood Culture adequate volume  Final   Culture   Final    NO GROWTH 5 DAYS Performed at [facility], [address] [place], [place] [address]    Report Status 11/07/2021 FINAL  Final  Gram stain     Status: None   Collection Time: 11/02/21  8:54 AM   Specimen: Pleura  Result Value Ref Range Status   Specimen Description PLEURAL  Final   Special Requests NONE  Final   Gram Stain   Final    NO ORGANISMS SEEN WBC PRESENT, PREDOMINANTLY PMN CYTOSPIN SMEAR Performed at [facility], [address], [place], [place] [address]    Report Status 11/02/2021 FINAL  Final  MRSA Next Gen by PCR, Nasal     Status: None   Collection Time: 11/09/21  3:32 AM   Specimen: Nasal Mucosa; Nasal Swab  Result Value Ref Range Status   MRSA by PCR Next Gen NOT DETECTED NOT DETECTED Final    Comment: (NOTE) The GeneXpert  MRSA Assay (FDA approved for NASAL specimens only), is one component of a comprehensive MRSA colonization surveillance program. It is not intended to diagnose MRSA infection nor to guide or monitor treatment for MRSA infections. Test performance is not FDA approved in patients less than 24 years old. Performed at [facility], [place] [address], [place], [place] [address]     Radiology Studies: DG CHEST PORT 1 VIEW  Result Date: 11/08/2021 CLINICAL DATA:  Shortness of breath EXAM: PORTABLE CHEST 1 VIEW COMPARISON:  11/07/2021 FINDINGS: Cardiac shadow is stable. Aortic calcifications are again seen. Increasing right-sided pleural effusion is noted although this may be in part due to semi erect positioning. Left-sided effusion is noted as well. Central vascular congestion is noted with mild edema. IMPRESSION: Increasing central vascular congestion. Bilateral pleural effusions which appear worsened from the prior study but may be in part due to patient positioning. Electronically Signed   By: Inez Catalina M.D.   On: 11/08/2021 09:45    Scheduled Meds:  aspirin EC  81 mg Oral Daily   atorvastatin  20 mg Oral Daily   calcitRIOL  0.25 mcg Oral Q M,W,F-HD   calcium acetate  667 mg Oral TID WC   Chlorhexidine Gluconate Cloth  6 each Topical Q0600   Chlorhexidine Gluconate Cloth  6 each Topical Q0600   Chlorhexidine Gluconate Cloth  6 each Topical Q0600   chlorpheniramine-HYDROcodone  5 mL Oral Once   clopidogrel  75 mg Oral Daily   feeding supplement (GLUCERNA SHAKE)  237 mL Oral TID BM   ferrous sulfate  325 mg Oral Q breakfast   insulin aspart  0-5 Units Subcutaneous QHS   insulin aspart  0-6 Units Subcutaneous TID WC   insulin glargine-yfgn  8 Units Subcutaneous QHS   liver oil-zinc oxide   Topical BID   midodrine  10 mg Oral TID WC   pantoprazole  40 mg Oral Daily   saccharomyces boulardii  250 mg Oral BID   sodium chloride flush  3 mL Intravenous Q12H   Continuous Infusions:   sodium chloride Stopped (11/08/21  4103)   ampicillin-sulbactam (UNASYN) IV Stopped (11/09/21 0131)   anticoagulant sodium citrate      LOS: 9 days   Raiford Noble, DO [facility] Available via Epic secure chat 7am-7pm After these hours, please refer to coverage provider listed on [email or web address] 11/09/2021, 6:07 PM

## 2021-11-09 NOTE — Plan of Care (Signed)

## 2021-11-09 NOTE — Progress Notes (Signed)
Patient ID: Paul Gonzalez, male   DOB: 1965-11-29, 56 y.o.   MRN: [id number]  [facility] Progress Note   Assessment/ Plan:   1.  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  Now on antiplatelet therapy with aspirin and Plavix and plans noted for right lower extremity arterial duplex/[name] in 4 to 6 weeks as an outpatient with vascular surgery. 2. ESRD: On schedule for hemodialysis on a Monday/Wednesday/Friday schedule 3. Anemia: Hemoglobin and hematocrit currently within acceptable range, continue to monitor on serial labs. 4. CKD-MBD: Marginally elevated phosphorus noted on labs yesterday morning with elevated corrected calcium level.  Continue to monitor on renal diet and will switch from calcium acetate to noncalcium-containing binder for phosphorus control.  On calcitriol for PTH control. 5. Nutrition: Continue renal diet for significant hypoalbuminemia. 6. Hypotension: Chronic hypotension on management with scheduled midodrine 10 mg 3 times a day.  Subjective:   Reports to be feeling fair and expresses frustration at postponed procedures/being kept NPO.    Objective:   BP (!) 98/52   Pulse 96   Temp 98 F (36.7 C) (Oral)   Resp 20   Ht '5\' 9"'$  (1.753 m)   Wt 84.7 kg   SpO2 100%   BMI 27.58 kg/m   Physical Exam: Gen: Chronically ill-appearing man on oxygen via nasal cannula. CVS: Pulse regular rhythm, 2/6 ejection systolic murmur audible over apex/outflow tract Resp: Clear to auscultation bilaterally, no rales/rhonchi Abd: Soft, flat, nontender, bowel sounds normal Ext: Right hallux with dry gangrene, LUA AVF cannulated at HD  Labs: BMET Recent Labs  Lab 11/03/21 0306 11/04/21 0053 11/05/21 0045 11/27/2021 0034 11/07/21 0054 11/08/21 0054 11/09/21 0030  NA 132* 137 133* 133* 132* 128* 128*  K 3.9 3.4* 4.9 3.3* 3.6 3.9 4.3  CL 95* 108 93* 88* 95* 87* 86*   CO2 27 20* 23 21* '25 24 25  '$ GLUCOSE 150* 174* 217* 200* 203* 215* 183*  BUN 40* 39* 62* 33* 27* 38* 45*  CREATININE 5.04* 4.60* 7.37* 4.58* 4.58* 5.57* 6.50*  CALCIUM 8.8* 6.8* 9.4 7.9* 9.1 9.1 9.2  PHOS 5.5* 4.2 6.5* 4.5 4.3 5.5* 6.8*   CBC Recent Labs  Lab 11/20/2021 0034 11/07/21 0054 11/08/21 0054 11/09/21 0030  WBC 15.0* 18.0* 16.4* 16.7*  NEUTROABS 13.7* 16.2* 14.3* 14.7*  HGB 9.8* 11.0* 10.8* 10.8*  HCT 31.0* 33.7* 33.2* 34.4*  MCV 90.1 87.5 88.3 90.5  PLT 152 186 196 222    Medications:     aspirin EC  81 mg Oral Daily   atorvastatin  20 mg Oral Daily   calcitRIOL  0.25 mcg Oral Q M,W,F-HD   calcium acetate  667 mg Oral TID WC   Chlorhexidine Gluconate Cloth  6 each Topical Q0600   Chlorhexidine Gluconate Cloth  6 each Topical Q0600   Chlorhexidine Gluconate Cloth  6 each Topical Q0600   chlorpheniramine-HYDROcodone  5 mL Oral Once   clopidogrel  75 mg Oral Daily   feeding supplement (GLUCERNA SHAKE)  237 mL Oral TID BM   ferrous sulfate  325 mg Oral Q breakfast   insulin aspart  0-5 Units Subcutaneous QHS   insulin aspart  0-6 Units Subcutaneous TID WC   insulin glargine-yfgn  8 Units Subcutaneous QHS   midodrine  10 mg Oral TID WC   pantoprazole  40 mg Oral Daily   saccharomyces boulardii  250 mg Oral BID   sodium  chloride flush  3 mL Intravenous Q12H   Elmarie Shiley, MD 11/09/2021, 9:03 AM

## 2021-11-09 NOTE — Progress Notes (Signed)
Recheck pt bp at 13:03 87/48(61)

## 2021-11-10 ENCOUNTER — Inpatient Hospital Stay (HOSPITAL_COMMUNITY): Payer: Medicare Other | Admitting: Anesthesiology

## 2021-11-10 ENCOUNTER — Encounter (HOSPITAL_COMMUNITY): Admission: EM | Disposition: E | Payer: Self-pay | Source: Home / Self Care | Attending: Internal Medicine

## 2021-11-10 ENCOUNTER — Encounter (HOSPITAL_COMMUNITY): Payer: Self-pay | Admitting: Internal Medicine

## 2021-11-10 ENCOUNTER — Inpatient Hospital Stay (HOSPITAL_COMMUNITY): Payer: Medicare Other

## 2021-11-10 DIAGNOSIS — I350 Nonrheumatic aortic (valve) stenosis: Secondary | ICD-10-CM

## 2021-11-10 DIAGNOSIS — E1122 Type 2 diabetes mellitus with diabetic chronic kidney disease: Secondary | ICD-10-CM

## 2021-11-10 DIAGNOSIS — Z992 Dependence on renal dialysis: Secondary | ICD-10-CM | POA: Diagnosis not present

## 2021-11-10 DIAGNOSIS — R7989 Other specified abnormal findings of blood chemistry: Secondary | ICD-10-CM | POA: Diagnosis not present

## 2021-11-10 DIAGNOSIS — N186 End stage renal disease: Secondary | ICD-10-CM

## 2021-11-10 DIAGNOSIS — I330 Acute and subacute infective endocarditis: Secondary | ICD-10-CM

## 2021-11-10 DIAGNOSIS — I120 Hypertensive chronic kidney disease with stage 5 chronic kidney disease or end stage renal disease: Secondary | ICD-10-CM | POA: Diagnosis not present

## 2021-11-10 DIAGNOSIS — D631 Anemia in chronic kidney disease: Secondary | ICD-10-CM

## 2021-11-10 DIAGNOSIS — I5021 Acute systolic (congestive) heart failure: Secondary | ICD-10-CM

## 2021-11-10 DIAGNOSIS — Z7189 Other specified counseling: Secondary | ICD-10-CM

## 2021-11-10 DIAGNOSIS — I339 Acute and subacute endocarditis, unspecified: Secondary | ICD-10-CM

## 2021-11-10 DIAGNOSIS — I351 Nonrheumatic aortic (valve) insufficiency: Secondary | ICD-10-CM

## 2021-11-10 DIAGNOSIS — J90 Pleural effusion, not elsewhere classified: Secondary | ICD-10-CM | POA: Diagnosis not present

## 2021-11-10 DIAGNOSIS — E11628 Type 2 diabetes mellitus with other skin complications: Secondary | ICD-10-CM | POA: Diagnosis not present

## 2021-11-10 DIAGNOSIS — I739 Peripheral vascular disease, unspecified: Secondary | ICD-10-CM | POA: Diagnosis not present

## 2021-11-10 HISTORY — PX: TEE WITHOUT CARDIOVERSION: SHX5443

## 2021-11-10 LAB — COMPREHENSIVE METABOLIC PANEL
ALT: 21 U/L (ref 0–44)
AST: 21 U/L (ref 15–41)
Albumin: 2.3 g/dL — ABNORMAL LOW (ref 3.5–5.0)
Alkaline Phosphatase: 125 U/L (ref 38–126)
Anion gap: 17 — ABNORMAL HIGH (ref 5–15)
BUN: 34 mg/dL — ABNORMAL HIGH (ref 6–20)
CO2: 28 mmol/L (ref 22–32)
Calcium: 9.5 mg/dL (ref 8.9–10.3)
Chloride: 87 mmol/L — ABNORMAL LOW (ref 98–111)
Creatinine, Ser: 5.34 mg/dL — ABNORMAL HIGH (ref 0.61–1.24)
GFR, Estimated: 12 mL/min — ABNORMAL LOW (ref >60)
Glucose, Bld: 182 mg/dL — ABNORMAL HIGH (ref 70–99)
Potassium: 4.6 mmol/L (ref 3.5–5.1)
Sodium: 132 mmol/L — ABNORMAL LOW (ref 135–145)
Total Bilirubin: 0.8 mg/dL (ref 0.3–1.2)
Total Protein: 6.4 g/dL — ABNORMAL LOW (ref 6.5–8.1)

## 2021-11-10 LAB — GLUCOSE, CAPILLARY
Glucose-Capillary: 197 mg/dL — ABNORMAL HIGH (ref 70–99)
Glucose-Capillary: 177 mg/dL — ABNORMAL HIGH (ref 70–99)
Glucose-Capillary: 202 mg/dL — ABNORMAL HIGH (ref 70–99)
Glucose-Capillary: 199 mg/dL — ABNORMAL HIGH (ref 70–99)
Glucose-Capillary: 262 mg/dL — ABNORMAL HIGH (ref 70–99)

## 2021-11-10 LAB — CBC WITH DIFFERENTIAL/PLATELET
Abs Immature Granulocytes: 0.1 10*3/uL — ABNORMAL HIGH (ref 0.00–0.07)
Basophils Absolute: 0.1 10*3/uL (ref 0.0–0.1)
Basophils Relative: 1 %
Eosinophils Absolute: 0.2 10*3/uL (ref 0.0–0.5)
Eosinophils Relative: 1 %
HCT: 35.7 % — ABNORMAL LOW (ref 39.0–52.0)
Hemoglobin: 10.9 g/dL — ABNORMAL LOW (ref 13.0–17.0)
Immature Granulocytes: 1 %
Lymphocytes Relative: 3 %
Lymphs Abs: 0.5 10*3/uL — ABNORMAL LOW (ref 0.7–4.0)
MCH: 28.1 pg (ref 26.0–34.0)
MCHC: 30.5 g/dL (ref 30.0–36.0)
MCV: 92 fL (ref 80.0–100.0)
Monocytes Absolute: 1 10*3/uL (ref 0.1–1.0)
Monocytes Relative: 6 %
Neutro Abs: 14.9 10*3/uL — ABNORMAL HIGH (ref 1.7–7.7)
Neutrophils Relative %: 88 %
Platelets: 237 10*3/uL (ref 150–400)
RBC: 3.88 MIL/uL — ABNORMAL LOW (ref 4.22–5.81)
RDW: 21.2 % — ABNORMAL HIGH (ref 11.5–15.5)
WBC: 16.8 10*3/uL — ABNORMAL HIGH (ref 4.0–10.5)
nRBC: 0 % (ref 0.0–0.2)

## 2021-11-10 LAB — ECHO TEE
AR max vel: 0.6 cm2
AV Area VTI: 0.56 cm2
AV Area mean vel: 0.57 cm2
AV Mean grad: 36.7 mmHg
AV Peak grad: 55.8 mmHg
Ao pk vel: 3.73 m/s
MV VTI: 2.5 cm2
P 1/2 time: 153 msec

## 2021-11-10 LAB — MAGNESIUM: Magnesium: 1.9 mg/dL (ref 1.7–2.4)

## 2021-11-10 LAB — PHOSPHORUS: Phosphorus: 5.6 mg/dL — ABNORMAL HIGH (ref 2.5–4.6)

## 2021-11-10 LAB — PROTIME-INR
INR: 1.4 — ABNORMAL HIGH (ref 0.8–1.2)
Prothrombin Time: 17.1 seconds — ABNORMAL HIGH (ref 11.4–15.2)

## 2021-11-10 SURGERY — ECHOCARDIOGRAM, TRANSESOPHAGEAL
Anesthesia: Monitor Anesthesia Care

## 2021-11-10 MED ORDER — PHENYLEPHRINE 80 MCG/ML (10ML) SYRINGE FOR IV PUSH (FOR BLOOD PRESSURE SUPPORT)
PREFILLED_SYRINGE | INTRAVENOUS | Status: DC | PRN
  Administered 2021-11-10: 160 ug via INTRAVENOUS
  Administered 2021-11-10 (×2): 80 ug via INTRAVENOUS
  Administered 2021-11-10: 160 ug via INTRAVENOUS
  Administered 2021-11-10 (×2): 80 ug via INTRAVENOUS

## 2021-11-10 MED ORDER — INSULIN GLARGINE-YFGN 100 UNIT/ML ~~LOC~~ SOLN
12.0000 [IU] | Freq: Every day | SUBCUTANEOUS | Status: DC
  Administered 2021-11-10: 12 [IU] via SUBCUTANEOUS
  Filled 2021-11-10 (×2): qty 0.12

## 2021-11-10 MED ORDER — HEPARIN SODIUM (PORCINE) 1000 UNIT/ML DIALYSIS: 40.0000 [IU]/kg | INTRAMUSCULAR | Status: DC | PRN

## 2021-11-10 MED ORDER — PROPOFOL 500 MG/50ML IV EMUL
INTRAVENOUS | Status: DC | PRN
  Administered 2021-11-10: 80 ug/kg/min via INTRAVENOUS

## 2021-11-10 MED ORDER — LIDOCAINE 2% (20 MG/ML) 5 ML SYRINGE
INTRAMUSCULAR | Status: DC | PRN
  Administered 2021-11-10: 80 mg via INTRAVENOUS

## 2021-11-10 MED ORDER — MIDODRINE HCL 5 MG PO TABS
20.0000 mg | ORAL_TABLET | Freq: Three times a day (TID) | ORAL | Status: DC
  Administered 2021-11-10 – 2021-11-11 (×5): 20 mg via ORAL
  Filled 2021-11-10 (×5): qty 4

## 2021-11-10 MED ORDER — SEVELAMER CARBONATE 800 MG PO TABS
1600.0000 mg | ORAL_TABLET | Freq: Three times a day (TID) | ORAL | Status: DC
  Administered 2021-11-10 – 2021-11-11 (×4): 1600 mg via ORAL
  Filled 2021-11-10 (×4): qty 2

## 2021-11-10 NOTE — Anesthesia Preprocedure Evaluation (Signed)
Anesthesia Evaluation  Patient identified by MRN, date of birth, ID band Patient awake    Reviewed: Allergy & Precautions, H&P , NPO status , Patient's Chart, lab work & pertinent test results  Airway Mallampati: II   Neck ROM: full    Dental   Pulmonary shortness of breath 1.5 L pleural effusion drained this admission.   breath sounds clear to auscultation       Cardiovascular hypertension, + Peripheral Vascular Disease  + Valvular Problems/Murmurs AS and AI  Rhythm:regular Rate:Normal  TTE (11/01/21): EF 25%, mod-severe AS, mod-severe AI   Neuro/Psych  PSYCHIATRIC DISORDERS  Depression    negative neurological ROS     GI/Hepatic ,GERD  ,,  Endo/Other  diabetes, Type 2    Renal/GU ESRF and DialysisRenal disease     Musculoskeletal   Abdominal   Peds  Hematology  (+) Blood dyscrasia, anemia   Anesthesia Other Findings   Reproductive/Obstetrics                             Anesthesia Physical Anesthesia Plan  ASA: 4  Anesthesia Plan: MAC   Post-op Pain Management: Minimal or no pain anticipated   Induction: Intravenous  PONV Risk Score and Plan: 1 and Propofol infusion and Treatment may vary due to age or medical condition  Airway Management Planned: Nasal Cannula and Natural Airway  Additional Equipment: None  Intra-op Plan:   Post-operative Plan:   Informed Consent: I have reviewed the patients History and Physical, chart, labs and discussed the procedure including the risks, benefits and alternatives for the proposed anesthesia with the patient or authorized representative who has indicated his/her understanding and acceptance.     Dental advisory given  Plan Discussed with: CRNA, Anesthesiologist and Surgeon  Anesthesia Plan Comments:        Anesthesia Quick Evaluation

## 2021-11-10 NOTE — H&P (View-Only) (Signed)
Cardiology Progress Note  Patient ID: Paul Gonzalez MRN: [id number] DOB: 1965/10/18 Date of Encounter: 11/12/2021  Primary Cardiologist: Larae Grooms, MD  Subjective   Chief Complaint: SOB  HPI: Remains volume up.  Still short of breath.  Plan for transesophageal echo today.  ROS:  All other ROS reviewed and negative. Pertinent positives noted in the HPI.     Inpatient Medications  Scheduled Meds:  aspirin EC  81 mg Oral Daily   atorvastatin  20 mg Oral Daily   calcitRIOL  0.25 mcg Oral Q M,W,F-HD   calcium acetate  667 mg Oral TID WC   Chlorhexidine Gluconate Cloth  6 each Topical Q0600   Chlorhexidine Gluconate Cloth  6 each Topical Q0600   Chlorhexidine Gluconate Cloth  6 each Topical Q0600   chlorpheniramine-HYDROcodone  5 mL Oral Once   clopidogrel  75 mg Oral Daily   feeding supplement (GLUCERNA SHAKE)  237 mL Oral TID BM   ferrous sulfate  325 mg Oral Q breakfast   insulin aspart  0-5 Units Subcutaneous QHS   insulin aspart  0-6 Units Subcutaneous TID WC   insulin glargine-yfgn  8 Units Subcutaneous QHS   liver oil-zinc oxide   Topical BID   midodrine  10 mg Oral TID WC   pantoprazole  40 mg Oral Daily   saccharomyces boulardii  250 mg Oral BID   sodium chloride flush  3 mL Intravenous Q12H   Continuous Infusions:  sodium chloride Stopped (11/08/21 0858)   sodium chloride     ampicillin-sulbactam (UNASYN) IV 1.5 g (11/13/2021 0801)   anticoagulant sodium citrate     PRN Meds: sodium chloride, acetaminophen **OR** acetaminophen, acetaminophen, alteplase, anticoagulant sodium citrate, diphenhydrAMINE, guaiFENesin, heparin, hydrALAZINE, HYDROmorphone (DILAUDID) injection, labetalol, lidocaine (PF), lidocaine-prilocaine, melatonin, ondansetron **OR** ondansetron (ZOFRAN) IV, ondansetron (ZOFRAN) IV, oxyCODONE, pentafluoroprop-tetrafluoroeth, sodium chloride flush   Vital Signs   Vitals:   11/09/21 1300 11/09/21 1304 11/09/21 1956 11/16/2021 0403  BP: (!) 87/48   (!) 87/61 (!) 90/55  Pulse: 93 89 87 93  Resp:   18 18  Temp:   97.8 F (36.6 C) 97.9 F (36.6 C)  TempSrc:   Oral Oral  SpO2: 100% 100% 91% 100%  Weight: 84.2 kg   83.8 kg  Height:        Intake/Output Summary (Last 24 hours) at 12/03/2021 0829 Last data filed at 11/24/2021 0358 Gross per 24 hour  Intake 960 ml  Output 2800 ml  Net -1840 ml      11/05/2021    4:03 AM 11/09/2021    1:00 PM 11/09/2021    8:53 AM  Last 3 Weights  Weight (lbs) 184 lb 11.9 oz 185 lb 10 oz 189 lb 13.1 oz  Weight (kg) 83.8 kg 84.2 kg 86.1 kg      Telemetry  Overnight telemetry shows sinus rhythm in the 90s, which I personally reviewed.   Physical Exam   Vitals:   11/09/21 1300 11/09/21 1304 11/09/21 1956 11/13/2021 0403  BP: (!) 87/48  (!) 87/61 (!) 90/55  Pulse: 93 89 87 93  Resp:   18 18  Temp:   97.8 F (36.6 C) 97.9 F (36.6 C)  TempSrc:   Oral Oral  SpO2: 100% 100% 91% 100%  Weight: 84.2 kg   83.8 kg  Height:        Intake/Output Summary (Last 24 hours) at 11/04/2021 0829 Last data filed at 11/05/2021 0358 Gross per 24 hour  Intake 960 ml  Output 2800  ml  Net -1840 ml       11/21/2021    4:03 AM 11/09/2021    1:00 PM 11/09/2021    8:53 AM  Last 3 Weights  Weight (lbs) 184 lb 11.9 oz 185 lb 10 oz 189 lb 13.1 oz  Weight (kg) 83.8 kg 84.2 kg 86.1 kg    Body mass index is 27.28 kg/m.  General: Well nourished, well developed, in no acute distress Head: Atraumatic, normal size  Eyes: PEERLA, EOMI  Neck: Supple, JVD 8 to 10 cm of water Endocrine: No thryomegaly Cardiac: Normal S1, S2; RRR; harsh 3 out of 6 systolic ejection murmur, late peaking, consistent with severe aortic stenosis Lungs: Diminished breath sounds bilaterally Abd: Distended Ext: Absent pedal pulses, right great toe gangrenous Musculoskeletal: Gangrenous right great toe Skin: Warm and dry, no rashes   Neuro: Alert and oriented to person, place, time, and situation, CNII-XII grossly intact, no focal deficits   Psych: Normal mood and affect   Labs  High Sensitivity Troponin:  No results for input(s): "TROPONINIHS" in the last 720 hours.   Cardiac EnzymesNo results for input(s): "TROPONINI" in the last 168 hours. No results for input(s): "TROPIPOC" in the last 168 hours.  Chemistry Recent Labs  Lab 11/08/21 0054 11/09/21 0030 11/16/2021 0049  NA 128* 128* 132*  K 3.9 4.3 4.6  CL 87* 86* 87*  CO2 '24 25 28  '$ GLUCOSE 215* 183* 182*  BUN 38* 45* 34*  CREATININE 5.57* 6.50* 5.34*  CALCIUM 9.1 9.2 9.5  PROT 6.0* 6.2* 6.4*  ALBUMIN 2.2* 2.3* 2.3*  AST '18 19 21  '$ ALT '21 19 21  '$ ALKPHOS 114 118 125  BILITOT 1.2 0.9 0.8  GFRNONAA 11* 9* 12*  ANIONGAP 17* 17* 17*    Hematology Recent Labs  Lab 11/08/21 0054 11/09/21 0030 12/03/2021 0049  WBC 16.4* 16.7* 16.8*  RBC 3.76* 3.80* 3.88*  HGB 10.8* 10.8* 10.9*  HCT 33.2* 34.4* 35.7*  MCV 88.3 90.5 92.0  MCH 28.7 28.4 28.1  MCHC 32.5 31.4 30.5  RDW 21.2* 20.9* 21.2*  PLT 196 222 237   BNPNo results for input(s): "BNP", "PROBNP" in the last 168 hours.  DDimer No results for input(s): "DDIMER" in the last 168 hours.   Radiology  DG CHEST PORT 1 VIEW  Result Date: 11/09/2021 CLINICAL DATA:  Shortness of breath. EXAM: PORTABLE CHEST 1 VIEW COMPARISON:  Radiograph yesterday.  CT 10/27/2021 FINDINGS: Bilateral pleural effusions are similar to exam yesterday. Associated bibasilar volume loss typical of atelectasis. Similar vascular congestion. Stable heart size, although partially obscured by adjacent density. No pneumothorax. IMPRESSION: Unchanged bilateral pleural effusions and vascular congestion. Electronically Signed   By: Keith Rake M.D.   On: 11/09/2021 20:24    Cardiac Studies  TTE 11/01/2021  1. Left ventricular ejection fraction, by estimation, is 25 to 30%. The  left ventricle has severely decreased function. The left ventricle  demonstrates global hypokinesis. Left ventricular diastolic parameters are  consistent with Grade III  diastolic  dysfunction (restrictive).   2. Right ventricular systolic function is mildly reduced. The right  ventricular size is severely enlarged. There is moderately elevated  pulmonary artery systolic pressure. The estimated right ventricular  systolic pressure is 48.1 mmHg.   3. Left atrial size was severely dilated.   4. Right atrial size was mildly dilated.   5. A small pericardial effusion is present. The pericardial effusion is  circumferential.   6. The mitral valve is abnormal. Mild mitral valve regurgitation.  Mild  mitral stenosis. Moderate mitral annular calcification.   7. The aortic valve is calcified. Aortic valve regurgitation is moderate  to severe. Moderate to severe aortic valve stenosis. GRadient of 36 mm Hg  at max despite decreased strok volume index. DVI 0.20.    Vasc Procedure 11/05/2021 1.  Ultrasound-guided access left common femoral artery 2.  Aortogram with catheter selection of aorta 3.  Bilateral lower extremity arteriogram with selection of third order branches in the right lower extremity 4.  Right posterior tibial artery angioplasty (2.5 mm x 150 mm [place]) 5.  Right mid to distal SFA drug-coated balloon angioplasty (5 mm x 150 mm drug-coated impact) 6.  94 minutes of monitored moderate conscious sedation time 7.  Mynx closure of the left common femoral artery  Patient Profile  Mason Dibiasio is a 56 y.o. male with ESRD on hemodialysis, aortic stenosis, hypertension, diabetes who was [facility] to [facility] 10[facility]/2021 with sepsis secondary to right toe gangrene.  Course complicated by bacteremia with Bacteroides species.  Cardiology was consulted for severe aortic stenosis/regurgitation with newly reduced ejection fraction of 30%.   Assessment & Plan   #Acute systolic heart failure, EF 25-30% #Severe aortic stenosis/regurgitation #Bacteremia -Admitted with sepsis and gangrenous right great toe.  Status post peripheral intervention to the  right PT and right SFA.  He has critical limb ischemia -His aortic stenosis is severe.  It has progressed.  I do have concerns he may have endocarditis.  N.p.o. for transesophageal echo. -If his TEE is negative for endocarditis he will need to be evaluated for TAVR.  I believe he may not be a great candidate.  He has a low EF with hypotension on dialysis.  He has severe PAD.  His approach may be palliative. -No digoxin given ESRD.  Not a candidate for ACE/ARB/ARNI/MRA.  No beta-blocker.  Really limited options. -Volume status per nephrology.  Again still volume up.  #PAD #Critical limb ischemia status post right PT/SFA angioplasty -Here with gangrene of right great toe.  Status post PT angioplasty and right SFA angioplasty on 11/05/2021 with vascular surgery. -Continue aspirin statin and Plavix.  #Severe coronary calcifications -Undoubtedly has CAD.  No symptoms of angina.  On aspirin statin and Plavix.  #ESRD -Per nephrology    For questions or updates, please contact [place] Please consult [email or web address] for contact info under   Signed, [place] T. Audie Box, MD, [place]  12/01/2021 8:29 AM

## 2021-11-10 NOTE — Progress Notes (Signed)
  Echocardiogram Echocardiogram Transesophageal has been performed.  Paul Gonzalez M 11/13/2021, 12:14 PM

## 2021-11-10 NOTE — Progress Notes (Signed)
Cardiology Progress Note  Patient ID: Paul Gonzalez MRN: [id number] DOB: September 06, 1965 Date of Encounter: 11/19/2021  Primary Cardiologist: Larae Grooms, MD  Subjective   Chief Complaint: SOB  HPI: Remains volume up.  Still short of breath.  Plan for transesophageal echo today.  ROS:  All other ROS reviewed and negative. Pertinent positives noted in the HPI.     Inpatient Medications  Scheduled Meds:  aspirin EC  81 mg Oral Daily   atorvastatin  20 mg Oral Daily   calcitRIOL  0.25 mcg Oral Q M,W,F-HD   calcium acetate  667 mg Oral TID WC   Chlorhexidine Gluconate Cloth  6 each Topical Q0600   Chlorhexidine Gluconate Cloth  6 each Topical Q0600   Chlorhexidine Gluconate Cloth  6 each Topical Q0600   chlorpheniramine-HYDROcodone  5 mL Oral Once   clopidogrel  75 mg Oral Daily   feeding supplement (GLUCERNA SHAKE)  237 mL Oral TID BM   ferrous sulfate  325 mg Oral Q breakfast   insulin aspart  0-5 Units Subcutaneous QHS   insulin aspart  0-6 Units Subcutaneous TID WC   insulin glargine-yfgn  8 Units Subcutaneous QHS   liver oil-zinc oxide   Topical BID   midodrine  10 mg Oral TID WC   pantoprazole  40 mg Oral Daily   saccharomyces boulardii  250 mg Oral BID   sodium chloride flush  3 mL Intravenous Q12H   Continuous Infusions:  sodium chloride Stopped (11/08/21 0858)   sodium chloride     ampicillin-sulbactam (UNASYN) IV 1.5 g (11/09/2021 0801)   anticoagulant sodium citrate     PRN Meds: sodium chloride, acetaminophen **OR** acetaminophen, acetaminophen, alteplase, anticoagulant sodium citrate, diphenhydrAMINE, guaiFENesin, heparin, hydrALAZINE, HYDROmorphone (DILAUDID) injection, labetalol, lidocaine (PF), lidocaine-prilocaine, melatonin, ondansetron **OR** ondansetron (ZOFRAN) IV, ondansetron (ZOFRAN) IV, oxyCODONE, pentafluoroprop-tetrafluoroeth, sodium chloride flush   Vital Signs   Vitals:   11/09/21 1300 11/09/21 1304 11/09/21 1956 11/09/2021 0403  BP: (!) 87/48   (!) 87/61 (!) 90/55  Pulse: 93 89 87 93  Resp:   18 18  Temp:   97.8 F (36.6 C) 97.9 F (36.6 C)  TempSrc:   Oral Oral  SpO2: 100% 100% 91% 100%  Weight: 84.2 kg   83.8 kg  Height:        Intake/Output Summary (Last 24 hours) at 12/01/2021 0829 Last data filed at 11/05/2021 0358 Gross per 24 hour  Intake 960 ml  Output 2800 ml  Net -1840 ml      11/12/2021    4:03 AM 11/09/2021    1:00 PM 11/09/2021    8:53 AM  Last 3 Weights  Weight (lbs) 184 lb 11.9 oz 185 lb 10 oz 189 lb 13.1 oz  Weight (kg) 83.8 kg 84.2 kg 86.1 kg      Telemetry  Overnight telemetry shows sinus rhythm in the 90s, which I personally reviewed.   Physical Exam   Vitals:   11/09/21 1300 11/09/21 1304 11/09/21 1956 11/09/2021 0403  BP: (!) 87/48  (!) 87/61 (!) 90/55  Pulse: 93 89 87 93  Resp:   18 18  Temp:   97.8 F (36.6 C) 97.9 F (36.6 C)  TempSrc:   Oral Oral  SpO2: 100% 100% 91% 100%  Weight: 84.2 kg   83.8 kg  Height:        Intake/Output Summary (Last 24 hours) at 11/08/2021 0829 Last data filed at 11/05/2021 0358 Gross per 24 hour  Intake 960 ml  Output 2800  ml  Net -1840 ml       12/02/2021    4:03 AM 11/09/2021    1:00 PM 11/09/2021    8:53 AM  Last 3 Weights  Weight (lbs) 184 lb 11.9 oz 185 lb 10 oz 189 lb 13.1 oz  Weight (kg) 83.8 kg 84.2 kg 86.1 kg    Body mass index is 27.28 kg/m.  General: Well nourished, well developed, in no acute distress Head: Atraumatic, normal size  Eyes: PEERLA, EOMI  Neck: Supple, JVD 8 to 10 cm of water Endocrine: No thryomegaly Cardiac: Normal S1, S2; RRR; harsh 3 out of 6 systolic ejection murmur, late peaking, consistent with severe aortic stenosis Lungs: Diminished breath sounds bilaterally Abd: Distended Ext: Absent pedal pulses, right great toe gangrenous Musculoskeletal: Gangrenous right great toe Skin: Warm and dry, no rashes   Neuro: Alert and oriented to person, place, time, and situation, CNII-XII grossly intact, no focal deficits   Psych: Normal mood and affect   Labs  High Sensitivity Troponin:  No results for input(s): "TROPONINIHS" in the last 720 hours.   Cardiac EnzymesNo results for input(s): "TROPONINI" in the last 168 hours. No results for input(s): "TROPIPOC" in the last 168 hours.  Chemistry Recent Labs  Lab 11/08/21 0054 11/09/21 0030 11/29/2021 0049  NA 128* 128* 132*  K 3.9 4.3 4.6  CL 87* 86* 87*  CO2 '24 25 28  '$ GLUCOSE 215* 183* 182*  BUN 38* 45* 34*  CREATININE 5.57* 6.50* 5.34*  CALCIUM 9.1 9.2 9.5  PROT 6.0* 6.2* 6.4*  ALBUMIN 2.2* 2.3* 2.3*  AST '18 19 21  '$ ALT '21 19 21  '$ ALKPHOS 114 118 125  BILITOT 1.2 0.9 0.8  GFRNONAA 11* 9* 12*  ANIONGAP 17* 17* 17*    Hematology Recent Labs  Lab 11/08/21 0054 11/09/21 0030 11/17/2021 0049  WBC 16.4* 16.7* 16.8*  RBC 3.76* 3.80* 3.88*  HGB 10.8* 10.8* 10.9*  HCT 33.2* 34.4* 35.7*  MCV 88.3 90.5 92.0  MCH 28.7 28.4 28.1  MCHC 32.5 31.4 30.5  RDW 21.2* 20.9* 21.2*  PLT 196 222 237   BNPNo results for input(s): "BNP", "PROBNP" in the last 168 hours.  DDimer No results for input(s): "DDIMER" in the last 168 hours.   Radiology  DG CHEST PORT 1 VIEW  Result Date: 11/09/2021 CLINICAL DATA:  Shortness of breath. EXAM: PORTABLE CHEST 1 VIEW COMPARISON:  Radiograph yesterday.  CT 10/10/2021 FINDINGS: Bilateral pleural effusions are similar to exam yesterday. Associated bibasilar volume loss typical of atelectasis. Similar vascular congestion. Stable heart size, although partially obscured by adjacent density. No pneumothorax. IMPRESSION: Unchanged bilateral pleural effusions and vascular congestion. Electronically Signed   By: Keith Rake M.D.   On: 11/09/2021 20:24    Cardiac Studies  TTE 11/01/2021  1. Left ventricular ejection fraction, by estimation, is 25 to 30%. The  left ventricle has severely decreased function. The left ventricle  demonstrates global hypokinesis. Left ventricular diastolic parameters are  consistent with Grade III  diastolic  dysfunction (restrictive).   2. Right ventricular systolic function is mildly reduced. The right  ventricular size is severely enlarged. There is moderately elevated  pulmonary artery systolic pressure. The estimated right ventricular  systolic pressure is 10.2 mmHg.   3. Left atrial size was severely dilated.   4. Right atrial size was mildly dilated.   5. A small pericardial effusion is present. The pericardial effusion is  circumferential.   6. The mitral valve is abnormal. Mild mitral valve regurgitation.  Mild  mitral stenosis. Moderate mitral annular calcification.   7. The aortic valve is calcified. Aortic valve regurgitation is moderate  to severe. Moderate to severe aortic valve stenosis. GRadient of 36 mm Hg  at max despite decreased strok volume index. DVI 0.20.    Vasc Procedure 11/05/2021 1.  Ultrasound-guided access left common femoral artery 2.  Aortogram with catheter selection of aorta 3.  Bilateral lower extremity arteriogram with selection of third order branches in the right lower extremity 4.  Right posterior tibial artery angioplasty (2.5 mm x 150 mm [place]) 5.  Right mid to distal SFA drug-coated balloon angioplasty (5 mm x 150 mm drug-coated impact) 6.  94 minutes of monitored moderate conscious sedation time 7.  Mynx closure of the left common femoral artery  Patient Profile  Keaton Beichner is a 56 y.o. male with ESRD on hemodialysis, aortic stenosis, hypertension, diabetes who was admitted to [facility] on 10/22/2021 with sepsis secondary to right toe gangrene.  Course complicated by bacteremia with Bacteroides species.  Cardiology was consulted for severe aortic stenosis/regurgitation with newly reduced ejection fraction of 30%.   Assessment & Plan   #Acute systolic heart failure, EF 25-30% #Severe aortic stenosis/regurgitation #Bacteremia -Admitted with sepsis and gangrenous right great toe.  Status post peripheral intervention to the  right PT and right SFA.  He has critical limb ischemia -His aortic stenosis is severe.  It has progressed.  I do have concerns he may have endocarditis.  N.p.o. for transesophageal echo. -If his TEE is negative for endocarditis he will need to be evaluated for TAVR.  I believe he may not be a great candidate.  He has a low EF with hypotension on dialysis.  He has severe PAD.  His approach may be palliative. -No digoxin given ESRD.  Not a candidate for ACE/ARB/ARNI/MRA.  No beta-blocker.  Really limited options. -Volume status per nephrology.  Again still volume up.  #PAD #Critical limb ischemia status post right PT/SFA angioplasty -Here with gangrene of right great toe.  Status post PT angioplasty and right SFA angioplasty on 11/05/2021 with vascular surgery. -Continue aspirin statin and Plavix.  #Severe coronary calcifications -Undoubtedly has CAD.  No symptoms of angina.  On aspirin statin and Plavix.  #ESRD -Per nephrology    For questions or updates, please contact [facility] Please consult [email or web address] for contact info under   Signed, Lake Bells T. Audie Box, MD, [place]  11/09/2021 8:29 AM

## 2021-11-10 NOTE — Progress Notes (Signed)
     Transesophageal Echocardiogram Note  Paul Gonzalez [id number] 02-03-1965  Procedure: Transesophageal Echocardiogram Indications: Aortic stenosis  Procedure Details Consent: Obtained Time Out: Verified patient identification, verified procedure, site/side was marked, verified correct patient position, special equipment/implants available, Radiology Safety Procedures followed,  medications/allergies/relevent history reviewed, required imaging and test results available.  Performed  Medications:  Pt sedated by anesthesia with diprovan 90 mg IV total.  Severe LV dysfunction; moderate LAE; no LAA thrombus; mild RAE; moderate RV dysfunction; small to moderate pericardial effusion; calcified aortic valve with severe AS (mean gradient 38 mmHg) and severe AI; oscillating density on noncoronary cusp concerning for vegetation; possible perforation of noncoronary cusp; mild to moderate MR; mild TR.   Complications: No apparent complications Patient did tolerate procedure well.  Kirk Ruths, MD

## 2021-11-10 NOTE — Anesthesia Postprocedure Evaluation (Signed)
Anesthesia Post Note  Patient: Paul Gonzalez  Procedure(s) Performed: TRANSESOPHAGEAL ECHOCARDIOGRAM (TEE)     Patient location during evaluation: PACU Anesthesia Type: MAC Level of consciousness: awake and alert Pain management: pain level controlled Vital Signs Assessment: post-procedure vital signs reviewed and stable Respiratory status: spontaneous breathing, nonlabored ventilation, respiratory function stable and patient connected to nasal cannula oxygen Cardiovascular status: stable and blood pressure returned to baseline Postop Assessment: no apparent nausea or vomiting Anesthetic complications: no   No notable events documented.  Last Vitals:  Vitals:   11/24/2021 1100 11/16/2021 1122  BP:  (!) 85/47  Pulse: 87 88  Resp: 20 18  Temp:  36.4 C  SpO2: 97% 100%    Last Pain:  Vitals:   11/15/2021 1122  TempSrc: Oral  PainSc:                  Paul Gonzalez

## 2021-11-10 NOTE — Interval H&P Note (Signed)
History and Physical Interval Note:  11/05/2021 9:47 AM  Paul Gonzalez  has presented today for surgery, with the diagnosis of severe aortic stenosis.  The various methods of treatment have been discussed with the patient and family. After consideration of risks, benefits and other options for treatment, the patient has consented to  Procedure(s): TRANSESOPHAGEAL ECHOCARDIOGRAM (TEE) (N/A) as a surgical intervention.  The patient's history has been reviewed, patient examined, no change in status, stable for surgery.  I have reviewed the patient's chart and labs.  Questions were answered to the patient's satisfaction.     Kirk Ruths

## 2021-11-10 NOTE — Progress Notes (Signed)
   11/28/2021 1006  Mobility  Activity Refused mobility   Mobility Specialist Progress Note  Pt refused mobility d/t about to have procedure. Will f/u as able.    Lucious Groves Mobility Specialist

## 2021-11-10 NOTE — Progress Notes (Signed)
Inpatient Diabetes Program Recommendations  AACE/ADA: New Consensus Statement on Inpatient Glycemic Control (2015)  Target Ranges:  Prepandial:   less than 140 mg/dL      Peak postprandial:   less than 180 mg/dL (1-2 hours)      Critically ill patients:  140 - 180 mg/dL   Lab Results  Component Value Date   GLUCAP 202 (H) 11/21/2021   HGBA1C 6.4 (H) 10/29/2021    Review of Glycemic Control  Latest Reference Range & Units 11/09/21 21:15 11/25/2021 06:16 11/20/2021 09:49 12/01/2021 11:18  Glucose-Capillary 70 - 99 mg/dL 204 (H) 197 (H) 177 (H) 202 (H)   Diabetes history: DM  Outpatient Diabetes medications:  NPH 10-25 units q HS Current orders for Inpatient glycemic control:  Novolog 0-6 units tid with meals and HS Semglee 8 units daily  Inpatient Diabetes Program Recommendations:    Consider increasing Semglee to 12 units daily.   Thanks,  Adah Perl, RN, BC-ADM Inpatient Diabetes Coordinator Pager [phone]  (8a-5p)

## 2021-11-10 NOTE — Progress Notes (Signed)
Patient ID: Paul Gonzalez, male   DOB: 10/27/1965, 56 y.o.   MRN: [id number] [facility] Progress Note   Assessment/ Plan:   1.  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  On antiplatelet therapy with aspirin and Plavix and plans noted for right lower extremity arterial duplex/[name] in 4 to 6 weeks as an outpatient with vascular surgery. 2. ESRD: Continue on MWF dialysis schedule with next treatment ordered for tomorrow.  Ultrafiltration limited by hypotension (will increase midodrine dose today). 3. Anemia: Hemoglobin and hematocrit currently within acceptable range, continue to monitor on serial labs. 4. CKD-MBD: Marginally elevated phosphorus noted on labs yesterday morning with elevated corrected calcium level.  Continue to monitor on renal diet and calcitriol for PTH control.  We will switch from calcium acetate to sevelamer. 5. Nutrition: Continue renal diet for significant hypoalbuminemia. 6. Hypotension: Chronic hypotension on management with scheduled midodrine 10 mg 3 times a day-increased today to 20 mg 3 times a day.  Subjective:   Awaiting TEE today and informs me that he is going to have surgical intervention to his right lower extremity tomorrow (I do not see anything documented).  Shortness of breath is improving however still persists with activity.   Objective:   BP (!) 90/55 (BP Location: Right Arm)   Pulse 93   Temp 97.9 F (36.6 C) (Oral)   Resp 18   Ht '5\' 9"'$  (1.753 m)   Wt 83.8 kg   SpO2 100%   BMI 27.28 kg/m   Physical Exam: Gen: Resting comfortably in bed, on oxygen via nasal cannula.  Appears chronically ill. CVS: Pulse regular rhythm, 2/6 ejection systolic murmur audible over apex/outflow tract Resp: Decreased breath sounds over both bases, no rales/rhonchi Abd: Soft, flat, nontender, bowel sounds normal Ext: Right hallux with dry gangrene, left  foot with plantar aspect areas of gangrene.  LUA AVF with poorly palpable thrill.  Labs: BMET Recent Labs  Lab 11/04/21 0053 11/05/21 0045 11/13/2021 0034 11/07/21 0054 11/08/21 0054 11/09/21 0030 11/08/2021 0049  NA 137 133* 133* 132* 128* 128* 132*  K 3.4* 4.9 3.3* 3.6 3.9 4.3 4.6  CL 108 93* 88* 95* 87* 86* 87*  CO2 20* 23 21* '25 24 25 28  '$ GLUCOSE 174* 217* 200* 203* 215* 183* 182*  BUN 39* 62* 33* 27* 38* 45* 34*  CREATININE 4.60* 7.37* 4.58* 4.58* 5.57* 6.50* 5.34*  CALCIUM 6.8* 9.4 7.9* 9.1 9.1 9.2 9.5  PHOS 4.2 6.5* 4.5 4.3 5.5* 6.8* 5.6*   CBC Recent Labs  Lab 11/07/21 0054 11/08/21 0054 11/09/21 0030 11/07/2021 0049  WBC 18.0* 16.4* 16.7* 16.8*  NEUTROABS 16.2* 14.3* 14.7* 14.9*  HGB 11.0* 10.8* 10.8* 10.9*  HCT 33.7* 33.2* 34.4* 35.7*  MCV 87.5 88.3 90.5 92.0  PLT 186 196 222 237    Medications:     aspirin EC  81 mg Oral Daily   atorvastatin  20 mg Oral Daily   calcitRIOL  0.25 mcg Oral Q M,W,F-HD   calcium acetate  667 mg Oral TID WC   Chlorhexidine Gluconate Cloth  6 each Topical Q0600   Chlorhexidine Gluconate Cloth  6 each Topical Q0600   Chlorhexidine Gluconate Cloth  6 each Topical Q0600   chlorpheniramine-HYDROcodone  5 mL Oral Once   clopidogrel  75 mg Oral Daily   feeding supplement (GLUCERNA SHAKE)  237 mL Oral TID BM   ferrous sulfate  325 mg  Oral Q breakfast   insulin aspart  0-5 Units Subcutaneous QHS   insulin aspart  0-6 Units Subcutaneous TID WC   insulin glargine-yfgn  8 Units Subcutaneous QHS   liver oil-zinc oxide   Topical BID   midodrine  10 mg Oral TID WC   pantoprazole  40 mg Oral Daily   saccharomyces boulardii  250 mg Oral BID   sodium chloride flush  3 mL Intravenous Q12H   Elmarie Shiley, MD 11/09/2021, 8:42 AM
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
## 2021-11-10 NOTE — Progress Notes (Signed)
PROGRESS NOTE    [name]  WLN:[id number] DOB: Jul 27, 1965 DOA: 10/15/2021 PCP: Coral Spikes, DO   Brief Narrative:  56 y.o. male with medical history significant of Hypertension, hyperlipidemia, ESRD on HD (MWF), anemia of chronic disease, diabetic retinopathy with vision loss who presents to the emergency department due to worsening shortness of breath that has been ongoing for about 2 weeks, this was associated with nonproductive cough.  He  also complained of bilateral diabetic foot ulcers and states that he has not been able to get an appointment with a podiatrist.  He denies chest pain, fever, chills, nausea, vomiting, diarrhea or constipation.   ED Course:  In the emergency department, he was intermittently tachypneic, BP was soft at 105/52, other vital signs were within normal range.  Work-up in the ED showed leukocytosis, normocytic anemia, BMP showed sodium 135, potassium 4.9, chloride 93, bicarb 25, glucose 173, BUN 45, creatinine 5.14, albumin 3.3, AST 57, ALT 47, total bilirubin 1.6.  Anion gap 17, lactic acid 2.4 > 2.8, BNP > 4,500.  Influenza A, B, SARS coronavirus 2 was negative.   Right foot x-ray showed no fracture or dislocation of the right foot.  No radiographic findings of osteomyelitis.  Soft tissue wound over the medial great toe.  Soft tissue edema of the forefoot.  Chest x-ray showed cardiomegaly without failure.  Bilateral pleural effusion and associated airspace disease, likely atelectasis, right greater than left.  He was treated with IV ceftriaxone, vancomycin and azithromycin.  Midodrine was given.  IV hydration was provided.  Hospitalist was asked to admit patient for further evaluation and management.  Orthopedic surgeon (Dr. Erlinda Hong) was consulted and patient will be seen when he arrives at [facility].    **Interim History Orthopedic surgery evaluated and recommended ABIs and also recommended vascular evaluation.  Vascular surgery is now evaluating and plan is for  aortogram with lower extremity arteriogram.  Nephrology is also been consulted and plan is for dialysis later today.   11/30/2021.  Patient was supposed to go for a TEE today given his bacteremia but this was canceled due to his hypotension.  Vascular surgery feels that he is optimized from the left lower extremity standpoint as well as he has inline flow with only single-vessel peroneal runoff.  They are recommending discussing aspirin and Plavix as well as statin daily and recommending following up in a month.  No allergies taking the patient back to dialysis again today and he remains on IV Zosyn.  May need to broaden his antibiotics given that his WBC remains elevated but slowly trending down.   11/07/2021: Patient's WBC is trending back up again and now have asked ID to weigh in given his current condition.  Patient continues to complain of some shortness breath and the TEE is being rescheduled for Monday, 11/09/2021.  Nephrology recommending next dialysis session on Monday and has chest x-ray showing bilateral effusions and may be laying which makes the x-ray look worse.  Has a poor prognosis overall.  We will need to follow-up on orthopedic surgery evaluation and recommendations as well.   11/08/2021: Orthopedic surgery recommends no further intervention at this point in time and recommends discharging home and letting the foot declare itself for outpatient elective surgical intervention.  Patient is to go for TEE and was to be done today buit now pushed to Tuesday or Wednesday.  He remains hypotensive and a little drowsy today.  We will need to continue dialysis and watch him carefully.  ID  recommending at least 2 weeks of antibiotics for the bacteremia and today have narrowed from Zosyn to Unasyn   11/09/21: Underwent dialysis. Ortho re-evaluated and recommending improvement of cardiac status. TEE to be done Tuesday or Wednesday. ID recommending continuing current Abx. BP remains low. Given poor  progonosis will consult Palliative Care. Continues to be SOB  11/17/2021: TEE was done today and patient was noted to have severe left ventricular dysfunction with moderate left atrial enlargement with no LAA thrombus and the patient did have some mild right atrial enlargement as well as moderate right ventricular systolic dysfunction and a small to moderate pericardial effusion.  Patient had a calcified aortic valve with severe AAS and severe AI.  An oscillating density was noted on the noncoronary cusp which was concerning for vegetation and patient was found to have endocarditis.  Nephrology recommending continuing Monday Wednesday Friday dialysis schedule and because his ultrafiltration was limited by hypotension his midodrine is being increased from 10 mg 3 times daily to 20 mg p.o. 3 times daily.  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  They feel that the patient will require cardiac cath and need to be off of Plavix for 7 days and optimized with dialysis and have sterile blood cultures for a week with antibiotics.  Palliative care has been consulted for further goals of care discussion.  The vascular surgeon also feels that the source of his bacteremia will need to be addressed and they feel that it he will need an amputation of his toe prior to an AVR   Assessment and Plan: Sepsis secondary to B Fragilis Bacteremia and concomitant Gangrene Right Toe with Cellulitis -Patient will likely require amputation of gangrenous tissue but Ortho feels does not need to be inpatient given that he has been optimized  -Continue antibiotics for B fragilis coverage with IV zosyn and changed to IV Unasyn by ID -Aortogram with LE arteriorgram done  -[name]'s ordered and showed and reveals [place]  vessels on the right with biphasic waveforms and dampened monophasic waveforms on the left.  -Patient undergoing an aortogram with lower extremity arteriogram done given that there is tissue loss and he had a right posterior tibial artery angioplasty and right mid to distal SFV drug-coated balloon angioplasty and has been optimized in the right lower extremity after SFA and posterior tibial intervention with inline flow down the right lower extremity with via the posterior tibial; patient was loaded with Plavix and aspirin and statin and they feel that if the right foot wounds fail to heal he would be a candidate for retrograde right AT access -WBC has gone from 13.0 -> 16.6 x2 -> 15.0 -> 18.0 -> 16.4 and is now 16.7 yesterday and today is now 16 point -Orthopedic Surgery consulted and will follow up after Vascular Surgery evaluation -Vascular feels that he has been optimized from their standpoint -Patient will likely need a toe amputation given his concomitant bacteremia and likely the source of his endocarditis.  Patient is at very high risk for further worsening and decompensation so palliative care has been consulted -Given the endocarditis noted on his valve the cardiothoracic surgeon feels that he may just end up needing a AVR despite him being very high risk.  The cardiothoracic surgery feels that the source of his bacteremia will  need to be taken care of and that he would need a toe amputation prior to this   New finding of Cardiomyopathy  Acute Systolic CHF/HFrEF Volume overload -Fluid management with hemodialysis  -Midodrine dose increased due to soft BPs limiting volume removal for HD -Echo with findings of change in EF now down to 20-25% with severely decreased function of the left ventricle demonstrates global hypokinesis and left ventricular diastolic parameters consistent with grade 3 diastolic dysfunction and restricted as well as the right ventricular systolic function is mildly  reduced; patient's aortic valve regurgitation is moderate to severe and moderate to severe aortic valve stenosis was noted with a gradient of 36 mmHg metrics despite decrease stroke-volume index -BNP was >4,500 and he remains volume overloaded  -Inpatient cardiology consultation requested -Pt may require TEE to better assess for valvular disease and cardiology recommending and planning for this later this week -Appreciate cardiology team recs; they will follow him at [facility]  -Volume maintenance is via dialysis and he underwent thoracentesis of the right pleural effusion on 11/02/2021 with removal 1.5 L of fluid. -He is not on any GDMT due to hypotension requiring midodrine and ESRD -Cardiology is unclear whether cardiomyopathy secondary to progressive aortic valve disease but they would not pursue any invasive work-up at this time given his acute bacteremia; TEE canceled and rescheduled for Monday 11/09/21 however was not able to be done today so will be rescheduled for either Tuesday or Wednesday -Getting volume maintenance by dialysis if tolerated -Remains volume overloaded   B Fragilis Bacteremia -My colleague discussed with Pharmacist and the patient waschanged to IV Zosyn for better coverage -Cardiology recommending TEE given his bacteremia and has echo that was different from the one done in March and there unable to perform it at the same time as a peripheral arteriogram so they are planning for this on Friday, 11/18/2021 however this had to be canceled due to his hypotension -Will consult ID for further evaluation and recommendations and they have de-escalated IV Zosyn to IV Unasyn and will continue and recommending following TEE and Ortho recommendations -TEE done and he has a bacterial endocarditis and vegetation. -Cardiothoracic surgery has been consulted and feel to have a high surgical mortality with proceeding with an AVR however the cardiothoracic surgeon felt that there may not be any other  option moving forward.  They feel that the patient will require cardiac cath and need to be off of Plavix for 7 days and optimized with dialysis and have sterile blood cultures for a week with antibiotics.  Palliative care has been consulted for further goals of care discussion.  The cardiothoracic surgeon also feels that the source of his bacteremia will need to be addressed and they feel that it he will need an amputation of his toe prior to an AVR which will need to be done by the orthopedic surgeon   ESRD on HD Elevated anion gap metabolic Acidosis -He is on schedule with MWF HD treatments -Nephrology was consulted regarding inpatient HD treatments -Electrolytes are stable at this time and Na+ improved from 132 -> 137 -> 132 and further dropped to 128 but K+ is now 4.3 -Patient has a slight metabolic acidosis with a CO2 of 25, anion gap of 17, chloride level of 86 -Patient's BUN/Cr went from 63/6.21 -> 40/5.04 -> 39/4.60 -> 62/7.37 -> 33/4.58 -> 27/4.58 -> 38/5.57 and is now 45/6.50 and today is now 34/5.34 -Nephrology team has been consulted and plan is for Dialysis again on Wednesday  Hypokalemia Hypomagnesemia Hyponatremia -As above -Patient's K+ is now 4.6 and magnesium level is now 1.9 -Na+ is now 132 and likley 2/2 to Hypervolemia  -Continue to Monitor and Trend and likely to be repleted in dialysis -Repeat CMP in the AM    Lactic Acidosis -Last Lactate down to 2.1 now   Bilateral Pleural Effusions R>L -Thoracentesis ordered for large right effusion, completed 10/30 with 1.5L removed -Continue supportive care and volume management with HD for now -Not currently with any symptoms of respiratory distress -Manage as per dialysis and may need a repeat thoracentesis -CXR yesterday AM done and showed "Increasing central vascular congestion. Bilateral pleural effusions which appear worsened from the prior study but may be in part due to patient positioning." -Repeat CXR this AM  showed "Unchanged bilateral pleural effusions and vascular congestion."   Leukocytosis  -Continue to monitor CBC/diff as response to treating infection -Follow-up blood cultures -Patient's WBC went from 15.8 -> 12.9 -> 13.0 -> 16.6 x2 -> 15.0 -> 18.0 -> 16.4 today is 16.7 and is now 16.8 -Continue to Monitor and Trend and repeat CBC in the AM  -We will get infectious disease involvement given his continued elevation in WBC   Diabetes mellitus with neurological, renal, vascular complications -Continue very sensitive NovoLog sliding scale before meals and at bedtime SSI coverage, daily lantus 8 units for basal coverage we will increase to 12 units, frequent CBG monitoring -Continue monitor CBGs per protocol and CBGs ranging from 192-204   Chronic Hypotension  -Had resumed Midodrine, dose increased by nephrology due to soft BPs limiting HD volume removal and also prevented him from getting a TEE but was able to get TEE today; nephrology is now increasing his midodrine to 20 mg 3 times daily -Continue to monitor blood pressures per protocol -Last blood pressure reading is on the softer side at 87/48    Normocytic Anemia -Patient's Hgb/Hct went from 11.2/35.2 -> 8.6/27.1 -> 10.9/34.7 -> 10.7/34.0 -> 9.8/31.0 -> 11.0/33.7 and is now 10.8/34.4 yesterday and today it is 10.9/35.7 is stable -Check Anemia Panel in the AM  -Continue to Monitor for S/Sx of Bleeding; No overt bleeding noted -Repeat CBC in the AM    Thrombocytopenia -Patient's Plaletet Count went from Platelet went from 126 -> 119 -> 150 -> 161 -> 152 -> 186 -> 196 and is improved to 222 yesterday and today is now 237 -Continue to Monitor for S/Sx of Bleeding; No overt bleeding noted -Repeat CBC in the AM    Hypoalbuminemia -Patient's Albumin Level is now 2.2 yesterday and today now 2.3 again -Continue to Monitor and Trend -Repeat CMP in the AM    Hyponatremia -Patient's sodium is now 132 and dropped further to 128 again  yesterday but is now improved back to 132 -Likely to be repleted in dialysis and corrected there -Continue monitor and trend and repeat CMP in a.m. -Repeat CMP in the AM     DVT prophylaxis: SCDs Start: 11/01/2021 2114    Code Status: DNR Family Communication: Discussed with the wife at bedside  Disposition Plan:  Level of care: Telemetry Medical Status is: Inpatient Remains inpatient appropriate because: Has multiple medical comorbidities and has multiple specialist care involved and will need further discussions about goals of care   Consultants:  Nephrology Cardiology Orthopedic Surgery Vascular Surgery Infectious Diseases  Palliative Care Medicine  Cardiothoracic Surgery  Procedures:  As delineated as above    ECHOCARDIOGRAM IMPRESSIONS     1. Left ventricular ejection fraction, by estimation,  is 25 to 30%. The  left ventricle has severely decreased function. The left ventricle  demonstrates global hypokinesis. Left ventricular diastolic parameters are  consistent with Grade III diastolic  dysfunction (restrictive).   2. Right ventricular systolic function is mildly reduced. The right  ventricular size is severely enlarged. There is moderately elevated  pulmonary artery systolic pressure. The estimated right ventricular  systolic pressure is 97.6 mmHg.   3. Left atrial size was severely dilated.   4. Right atrial size was mildly dilated.   5. A small pericardial effusion is present. The pericardial effusion is  circumferential.   6. The mitral valve is abnormal. Mild mitral valve regurgitation. Mild  mitral stenosis. Moderate mitral annular calcification.   7. The aortic valve is calcified. Aortic valve regurgitation is moderate  to severe. Moderate to severe aortic valve stenosis. GRadient of 36 mm Hg  at max despite decreased strok volume index. DVI 0.20.   Comparison(s): Significant changes from prior. Atempting to reach primary  team.   FINDINGS   Left  Ventricle: Left ventricular ejection fraction, by estimation, is 25  to 30%. The left ventricle has severely decreased function. The left  ventricle demonstrates global hypokinesis. The left ventricular internal  cavity size was normal in size. There  is no left ventricular hypertrophy. Left ventricular diastolic parameters  are consistent with Grade III diastolic dysfunction (restrictive).   Right Ventricle: The right ventricular size is severely enlarged. No  increase in right ventricular wall thickness. Right ventricular systolic  function is mildly reduced. There is moderately elevated pulmonary artery  systolic pressure. The tricuspid  regurgitant velocity is 2.82 m/s, and with an assumed right atrial  pressure of 15 mmHg, the estimated right ventricular systolic pressure is  73.4 mmHg.   Left Atrium: Left atrial size was severely dilated.   Right Atrium: Right atrial size was mildly dilated.   Pericardium: A small pericardial effusion is present. The pericardial  effusion is circumferential. Presence of epicardial fat layer.   Mitral Valve: The mitral valve is abnormal. Moderate mitral annular  calcification. Mild mitral valve regurgitation. Mild mitral valve  stenosis.   Tricuspid Valve: The tricuspid valve is normal in structure. Tricuspid  valve regurgitation is mild . No evidence of tricuspid stenosis.   Aortic Valve: The aortic valve is calcified. Aortic valve regurgitation is  moderate to severe. Aortic regurgitation PHT measures 160 msec. Moderate  to severe aortic stenosis is present. Aortic valve mean gradient measures  36.0 mmHg. Aortic valve peak  gradient measures 59.9 mmHg. Aortic valve area, by VTI measures 0.54 cm.   Pulmonic Valve: The pulmonic valve was grossly normal. Pulmonic valve  regurgitation is trivial. No evidence of pulmonic stenosis.   Aorta: The aortic root is normal in size and structure.   IAS/Shunts: No atrial level shunt detected by color  flow Doppler.     LEFT VENTRICLE  PLAX 2D  LVIDd:         5.50 cm  LVIDs:         4.70 cm  LV PW:         1.10 cm  LV IVS:        1.00 cm  LVOT diam:     1.90 cm  LV SV:         46  LV SV Index:   24  LVOT Area:     2.84 cm    LV Volumes (MOD)  LV vol d, MOD A2C: 177.0 ml  LV  vol d, MOD A4C: 211.0 ml  LV vol s, MOD A2C: 116.0 ml  LV vol s, MOD A4C: 133.0 ml  LV SV MOD A2C:     61.0 ml  LV SV MOD A4C:     211.0 ml  LV SV MOD BP:      73.3 ml   RIGHT VENTRICLE  RV S prime:     7.62 cm/s  TAPSE (M-mode): 1.5 cm   LEFT ATRIUM              Index        RIGHT ATRIUM           Index  LA diam:        5.30 cm  2.75 cm/m   RA Area:     21.40 cm  LA Vol (A2C):   148.0 ml 76.76 ml/m  RA Volume:   71.10 ml  36.88 ml/m  LA Vol (A4C):   143.0 ml 74.17 ml/m  LA Biplane Vol: 148.0 ml 76.76 ml/m   AORTIC VALVE  AV Area (Vmax):    0.55 cm  AV Area (Vmean):   0.64 cm  AV Area (VTI):     0.54 cm  AV Vmax:           387.00 cm/s  AV Vmean:          232.500 cm/s  AV VTI:            0.863 m  AV Peak Grad:      59.9 mmHg  AV Mean Grad:      36.0 mmHg  LVOT Vmax:         75.70 cm/s  LVOT Vmean:        52.500 cm/s  LVOT VTI:          0.164 m  LVOT/AV VTI ratio: 0.19  AI PHT:            160 msec    AORTA  Ao Root diam: 3.10 cm   MITRAL VALVE                TRICUSPID VALVE  MV Area (PHT): 6.17 cm     TR Peak grad:   31.8 mmHg  MV Decel Time: 123 msec     TR Vmax:        282.00 cm/s  MR Peak grad: 81.0 mmHg  MR Mean grad: 49.0 mmHg     SHUNTS  MR Vmax:      450.00 cm/s   Systemic VTI:  0.16 m  MR Vmean:     322.0 cm/s    Systemic Diam: 1.90 cm  MV E velocity: 114.00 cm/s  MV A velocity: 34.70 cm/s  MV E/A ratio:  3.29    Procedure Performed: 1.  Ultrasound-guided access left common femoral artery 2.  Aortogram with catheter selection of aorta 3.  Bilateral lower extremity arteriogram with selection of third order branches in the right lower extremity 4.  Right posterior  tibial artery angioplasty (2.5 mm x 150 mm [place]) 5.  Right mid to distal SFA drug-coated balloon angioplasty (5 mm x 150 mm drug-coated impact) 6.  94 minutes of monitored moderate conscious sedation time 7.  Mynx closure of the left common femoral artery  TRANSESOPHAGEAL ECHOCARDIOGRAM   Severe LV dysfunction; moderate LAE; no LAA thrombus; mild RAE; moderate RV dysfunction; small to moderate pericardial effusion; calcified aortic valve with severe AS (mean gradient 38 mmHg) and severe AI; oscillating density on noncoronary  cusp concerning for vegetation; possible perforation of noncoronary cusp; mild to moderate MR; mild TR.  Antimicrobials:  Anti-infectives (From admission, onward)    Start     Dose/Rate Route Frequency Ordered Stop   11/08/21 1500  ampicillin-sulbactam (UNASYN) 1.5 g in sodium chloride 0.9 % 100 mL IVPB        1.5 g 200 mL/hr over 30 Minutes Intravenous Every 12 hours 11/08/21 0922     11/04/21 2300  piperacillin-tazobactam (ZOSYN) IVPB 2.25 g  Status:  Discontinued        2.25 g 100 mL/hr over 30 Minutes Intravenous Every 8 hours 11/03/21 2305 11/04/21 0004   11/04/21 0015  piperacillin-tazobactam (ZOSYN) IVPB 2.25 g  Status:  Discontinued        2.25 g 100 mL/hr over 30 Minutes Intravenous Every 8 hours 11/04/21 0005 11/08/21 0852   11/02/21 1800  ceFEPIme (MAXIPIME) 2 g in sodium chloride 0.9 % 100 mL IVPB  Status:  Discontinued        2 g 200 mL/hr over 30 Minutes Intravenous Every M-W-F (1800) 10/23/2021 2119 11/02/21 0947   11/02/21 1200  vancomycin (VANCOREADY) IVPB 750 mg/150 mL  Status:  Discontinued        750 mg 150 mL/hr over 60 Minutes Intravenous Every M-W-F (Hemodialysis) 11/02/2021 2119 11/02/21 0948   11/02/21 1200  piperacillin-tazobactam (ZOSYN) IVPB 2.25 g  Status:  Discontinued        2.25 g 100 mL/hr over 30 Minutes Intravenous Every 8 hours 11/02/21 0952 11/02/21 1014   11/02/21 1200  piperacillin-tazobactam (ZOSYN) 2.25 g in sodium chloride  0.9 % 50 mL IVPB  Status:  Discontinued        2.25 g 100 mL/hr over 30 Minutes Intravenous Every 8 hours 11/02/21 1014 11/03/21 2305   10/10/2021 2130  vancomycin (VANCOREADY) IVPB 500 mg/100 mL        500 mg 100 mL/hr over 60 Minutes Intravenous  Once 10/19/2021 2119 11/01/21 0152   10/23/2021 2100  vancomycin (VANCOREADY) IVPB 1500 mg/300 mL  Status:  Discontinued        1,500 mg 150 mL/hr over 120 Minutes Intravenous  Once 10/05/2021 2057 10/05/2021 2119   10/08/2021 2100  ceFEPIme (MAXIPIME) 2 g in sodium chloride 0.9 % 100 mL IVPB        2 g 200 mL/hr over 30 Minutes Intravenous  Once 10/08/2021 2057 11/01/21 0152   10/08/2021 1630  vancomycin (VANCOCIN) IVPB 1000 mg/200 mL premix        1,000 mg 200 mL/hr over 60 Minutes Intravenous  Once 10/25/2021 1628 10/21/2021 1818   10/30/2021 1530  cefTRIAXone (ROCEPHIN) 2 g in sodium chloride 0.9 % 100 mL IVPB  Status:  Discontinued        2 g 200 mL/hr over 30 Minutes Intravenous Every 24 hours 10/09/2021 1522 10/19/2021 2119   10/04/2021 1530  azithromycin (ZITHROMAX) 500 mg in sodium chloride 0.9 % 250 mL IVPB  Status:  Discontinued        500 mg 250 mL/hr over 60 Minutes Intravenous Every 24 hours 10/30/2021 1522 11/02/21 0947       Subjective: Seen and examined at bedside he just come back from his TEE and requesting to be set up in the bed.  States he he is doing "okay".  Understands that he has endocarditis.  Complaining of some pain in his foot.  No other concerns or close at this time and wife is at bedside and had no questions  Objective:  Vitals:   11/13/2021 1055 11/13/2021 1100 11/05/2021 1122 11/18/2021 1918  BP: (!) 84/63  (!) 85/47 (!) 90/42  Pulse: 87 87 88 87  Resp: '17 20 18 18  '$ Temp:   97.6 F (36.4 C) (!) 97.5 F (36.4 C)  TempSrc:   Oral Oral  SpO2: 98% 97% 100% 100%  Weight:      Height:        Intake/Output Summary (Last 24 hours) at 11/08/2021 2106 Last data filed at 11/25/2021 1830 Gross per 24 hour  Intake 1287 ml  Output 0 ml  Net 1287  ml   Filed Weights   11/09/21 1300 11/19/2021 0403 12/02/2021 0949  Weight: 84.2 kg 83.8 kg 79.4 kg   Examination: Physical Exam:  Constitutional: WN/WD overweight chronically ill-appearing Caucasian male who is appearing a little uncomfortable Respiratory: Diminished to auscultation bilaterally with coarse breath sounds and some crackles noted.  No appreciable wheezing, rales, rhonchi but he does have a slightly elevated respiratory effort and wearing supplemental oxygen via nasal cannula Cardiovascular: RRR, has a mild 3 out of 6 systolic murmur and has some lower extremity edema noted.. No carotid bruits.  Abdomen: Soft, non-tender, distended second by habitus.  Bowel sounds present GU: Deferred. Musculoskeletal: No clubbing / cyanosis of digits/nails.  Skin: Tissue loss noted in his extremities especially his dry gangrene of his right big toe Neurologic: CN 2-12 grossly intact with no focal deficits. Romberg sign cerebellar reflexes not assessed.  Psychiatric: Normal judgment and insight. Alert and oriented x 3. Normal mood and sort of a flat affect  Data Reviewed: I have personally reviewed following labs and imaging studies  CBC: Recent Labs  Lab 11/28/2021 0034 11/07/21 0054 11/08/21 0054 11/09/21 0030 12/01/2021 0049  WBC 15.0* 18.0* 16.4* 16.7* 16.8*  NEUTROABS 13.7* 16.2* 14.3* 14.7* 14.9*  HGB 9.8* 11.0* 10.8* 10.8* 10.9*  HCT 31.0* 33.7* 33.2* 34.4* 35.7*  MCV 90.1 87.5 88.3 90.5 92.0  PLT 152 186 196 222 097   Basic Metabolic Panel: Recent Labs  Lab 11/27/2021 0034 11/07/21 0054 11/08/21 0054 11/09/21 0030 11/16/2021 0049  NA 133* 132* 128* 128* 132*  K 3.3* 3.6 3.9 4.3 4.6  CL 88* 95* 87* 86* 87*  CO2 21* '25 24 25 28  '$ GLUCOSE 200* 203* 215* 183* 182*  BUN 33* 27* 38* 45* 34*  CREATININE 4.58* 4.58* 5.57* 6.50* 5.34*  CALCIUM 7.9* 9.1 9.1 9.2 9.5  MG 1.6* 1.8 1.8 1.9 1.9  PHOS 4.5 4.3 5.5* 6.8* 5.6*   GFR: Estimated Creatinine Clearance: 15.4 mL/min (A) (by  C-G formula based on SCr of 5.34 mg/dL (H)). Liver Function Tests: Recent Labs  Lab 11/09/2021 0034 11/07/21 0054 11/08/21 0054 11/09/21 0030 11/28/2021 0049  AST '25 19 18 19 21  '$ ALT '23 23 21 19 21  '$ ALKPHOS 88 112 114 118 125  BILITOT 1.3* 1.2 1.2 0.9 0.8  PROT 8.3* 6.2* 6.0* 6.2* 6.4*  ALBUMIN 5.5* 2.4* 2.2* 2.3* 2.3*   No results for input(s): "LIPASE", "AMYLASE" in the last 168 hours. No results for input(s): "AMMONIA" in the last 168 hours. Coagulation Profile: Recent Labs  Lab 11/07/2021 0049  INR 1.4*   Cardiac Enzymes: No results for input(s): "CKTOTAL", "CKMB", "CKMBINDEX", "TROPONINI" in the last 168 hours. BNP (last 3 results) No results for input(s): "PROBNP" in the last 8760 hours. HbA1C: No results for input(s): "HGBA1C" in the last 72 hours. CBG: Recent Labs  Lab 11/09/21 2115 11/21/2021 0616 11/05/2021 0949 11/16/2021 1118 11/18/2021 1558  GLUCAP 204* 197* 177* 202* 199*   Lipid Profile: No results for input(s): "CHOL", "HDL", "LDLCALC", "TRIG", "CHOLHDL", "LDLDIRECT" in the last 72 hours. Thyroid Function Tests: No results for input(s): "TSH", "T4TOTAL", "FREET4", "T3FREE", "THYROIDAB" in the last 72 hours. Anemia Panel: No results for input(s): "VITAMINB12", "FOLATE", "FERRITIN", "TIBC", "IRON", "RETICCTPCT" in the last 72 hours. Sepsis Labs: No results for input(s): "PROCALCITON", "LATICACIDVEN" in the last 168 hours.  Recent Results (from the past 240 hour(s))  Culture, body fluid w Gram Stain-bottle     Status: None   Collection Time: 11/02/21  8:54 AM   Specimen: Pleura  Result Value Ref Range Status   Specimen Description PLEURAL BOTTLES DRAWN AEROBIC AND ANAEROBIC  Final   Special Requests Blood Culture adequate volume  Final   Culture   Final    NO GROWTH 5 DAYS Performed at [facility], [address], [place], [place] [address]    Report Status 11/07/2021 FINAL  Final  Gram stain     Status: None   Collection Time: 11/02/21  8:54 AM    Specimen: Pleura  Result Value Ref Range Status   Specimen Description PLEURAL  Final   Special Requests NONE  Final   Gram Stain   Final    NO ORGANISMS SEEN WBC PRESENT, PREDOMINANTLY PMN CYTOSPIN SMEAR Performed at [facility], [address] [place], [place] [address]    Report Status 11/02/2021 FINAL  Final  MRSA Next Gen by PCR, Nasal     Status: None   Collection Time: 11/09/21  3:32 AM   Specimen: Nasal Mucosa; Nasal Swab  Result Value Ref Range Status   MRSA by PCR Next Gen NOT DETECTED NOT DETECTED Final    Comment: (NOTE) The GeneXpert MRSA Assay (FDA approved for NASAL specimens only), is one component of a comprehensive MRSA colonization surveillance program. It is not intended to diagnose MRSA infection nor to guide or monitor treatment for MRSA infections. Test performance is not FDA approved in patients less than 61 years old. Performed at [facility], [place] [address] [place], [place] [address]      Radiology Studies: ECHO TEE  Result Date: 11/28/2021    TRANSESOPHOGEAL ECHO REPORT   Patient Name:   Paul Gonzalez Date of Exam: 11/30/2021 Medical Rec #:  [id number]       Height:       69.0 in Accession #:    [id number]      Weight:       184.7 lb Date of Birth:  [birth year]       BSA:          1.998 m Patient Age:    71 years        BP:           69/38 mmHg Patient Gender: M               HR:           86 bpm. Exam Location:  Inpatient Procedure: Transesophageal Echo, 3D Echo, Cardiac Doppler and Color Doppler Indications:     Nonrheumatic aortic (valve) stenosis I35.0  History:         Patient has prior history of Echocardiogram examinations, most                  recent 11/01/2021. CHF, Aortic Valve Disease; Risk                  Factors:Hypertension, Diabetes and Dyslipidemia. Chronic  kidney                  disease.  Sonographer:     Darlina Sicilian RDCS Referring Phys:  [place] Phys: Kirk Ruths MD PROCEDURE: After discussion of the  risks and benefits of a TEE, an informed consent was obtained from the patient. TEE procedure time was 17 minutes. The transesophogeal probe was passed without difficulty through the esophogus of the patient. Imaged were obtained with the patient in a left lateral decubitus position. Sedation performed by different physician. The patient was monitored while under deep sedation. Anesthestetic sedation was provided intravenously by Anesthesiology: 92.'1mg'$  of Propofol, '80mg'$  of Lidocaine. Image quality was good. The patient's vital signs; including heart rate, blood pressure, and oxygen saturation; remained stable throughout the procedure. The patient developed no complications during the procedure.  IMPRESSIONS  1. Tricuspid aortic valve with reduced cusp excursion; oscillating density on noncoronary cusp concerning for vegetation; possible perforation of noncoronary cusp; severe AS with mean gradient 37 mmHg and AVA 0.6 cm2; severe AI.  2. Left ventricular ejection fraction, by estimation, is 25 to 30%. The left ventricle has severely decreased function. The left ventricle demonstrates global hypokinesis. The left ventricular internal cavity size was mildly dilated.  3. Right ventricular systolic function is moderately reduced. The right ventricular size is normal.  4. Left atrial size was moderately dilated. No left atrial/left atrial appendage thrombus was detected.  5. Right atrial size was mildly dilated.  6. Small to moderate pericardial effusion.  7. The mitral valve is normal in structure. Mild to moderate mitral valve regurgitation.  8. The aortic valve is tricuspid. Aortic valve regurgitation is severe. Severe aortic valve stenosis.  9. There is mild (Grade II) plaque involving the descending aorta. FINDINGS  Left Ventricle: Left ventricular ejection fraction, by estimation, is 25 to 30%. The left ventricle has severely decreased function. The left ventricle demonstrates global hypokinesis. The left  ventricular internal cavity size was mildly dilated. Right Ventricle: The right ventricular size is normal. Right ventricular systolic function is moderately reduced. Left Atrium: Left atrial size was moderately dilated. No left atrial/left atrial appendage thrombus was detected. Right Atrium: Right atrial size was mildly dilated. Pericardium: Small to moderate pericardial effusion. Mitral Valve: The mitral valve is normal in structure. Mild mitral annular calcification. Mild to moderate mitral valve regurgitation. MV peak gradient, 3.9 mmHg. The mean mitral valve gradient is 1.0 mmHg. Tricuspid Valve: The tricuspid valve is normal in structure. Tricuspid valve regurgitation is mild. Aortic Valve: The aortic valve is tricuspid. Aortic valve regurgitation is severe. Aortic regurgitation PHT measures 153 msec. Severe aortic stenosis is present. Aortic valve mean gradient measures 36.7 mmHg. Aortic valve peak gradient measures 55.8 mmHg. Aortic valve area, by VTI measures 0.56 cm. Pulmonic Valve: The pulmonic valve was normal in structure. Pulmonic valve regurgitation is mild. Aorta: The aortic root is normal in size and structure. There is mild (Grade II) plaque involving the descending aorta. IAS/Shunts: No atrial level shunt detected by color flow Doppler. Additional Comments: Tricuspid aortic valve with reduced cusp excursion; oscillating density on noncoronary cusp concerning for vegetation; possible perforation of noncoronary cusp; severe AS with mean gradient 37 mmHg and AVA 0.6 cm2; severe AI. Spectral Doppler performed. LEFT VENTRICLE PLAX 2D LVOT diam:     2.00 cm LV SV:         43 LV SV Index:   22 LVOT Area:     3.14 cm  AORTIC  VALVE AV Area (Vmax):    0.60 cm AV Area (Vmean):   0.57 cm AV Area (VTI):     0.56 cm AV Vmax:           373.33 cm/s AV Vmean:          283.667 cm/s AV VTI:            0.777 m AV Peak Grad:      55.8 mmHg AV Mean Grad:      36.7 mmHg LVOT Vmax:         71.10 cm/s LVOT Vmean:         51.700 cm/s LVOT VTI:          0.138 m LVOT/AV VTI ratio: 0.18 AI PHT:            153 msec  AORTA Ao Root diam: 2.80 cm Ao Asc diam:  3.10 cm MITRAL VALVE             TRICUSPID VALVE MV Area VTI:  2.50 cm   TR Peak grad:   27.7 mmHg MV Peak grad: 3.9 mmHg   TR Vmax:        263.00 cm/s MV Mean grad: 1.0 mmHg MV Vmax:      0.99 m/s   SHUNTS MV Vmean:     50.4 cm/s  Systemic VTI:  0.14 m                          Systemic Diam: 2.00 cm Kirk Ruths MD Electronically signed by Kirk Ruths MD Signature Date/Time: 11/22/2021/1:16:06 PM    Final    DG CHEST PORT 1 VIEW  Result Date: 11/09/2021 CLINICAL DATA:  Shortness of breath. EXAM: PORTABLE CHEST 1 VIEW COMPARISON:  Radiograph yesterday.  CT 10/04/2021 FINDINGS: Bilateral pleural effusions are similar to exam yesterday. Associated bibasilar volume loss typical of atelectasis. Similar vascular congestion. Stable heart size, although partially obscured by adjacent density. No pneumothorax. IMPRESSION: Unchanged bilateral pleural effusions and vascular congestion. Electronically Signed   By: Keith Rake M.D.   On: 11/09/2021 20:24    Scheduled Meds:  aspirin EC  81 mg Oral Daily   atorvastatin  20 mg Oral Daily   calcitRIOL  0.25 mcg Oral Q M,W,F-HD   Chlorhexidine Gluconate Cloth  6 each Topical Q0600   Chlorhexidine Gluconate Cloth  6 each Topical Q0600   Chlorhexidine Gluconate Cloth  6 each Topical Q0600   chlorpheniramine-HYDROcodone  5 mL Oral Once   clopidogrel  75 mg Oral Daily   feeding supplement (GLUCERNA SHAKE)  237 mL Oral TID BM   ferrous sulfate  325 mg Oral Q breakfast   insulin aspart  0-5 Units Subcutaneous QHS   insulin aspart  0-6 Units Subcutaneous TID WC   insulin glargine-yfgn  8 Units Subcutaneous QHS   liver oil-zinc oxide   Topical BID   midodrine  20 mg Oral TID WC   pantoprazole  40 mg Oral Daily   saccharomyces boulardii  250 mg Oral BID   sevelamer carbonate  1,600 mg Oral TID WC   sodium chloride flush   3 mL Intravenous Q12H   Continuous Infusions:  sodium chloride Stopped (11/08/21 0858)   ampicillin-sulbactam (UNASYN) IV 1.5 g (12/03/2021 2031)   anticoagulant sodium citrate      LOS: 10 days   Raiford Noble, DO [facility] Available via Epic secure chat 7am-7pm After these hours, please refer to coverage provider listed  on [email or web address] 11/08/2021, 9:06 PM

## 2021-11-10 NOTE — Consult Note (Signed)
Consultation Note Date: 11/22/2021   Patient Name: Paul Gonzalez  DOB: Aug 07, 1965  MRN: [id number]  Age / Sex: 56 y.o., male  PCP: Coral Spikes, DO Referring Physician: Kerney Elbe, DO  Reason for Consultation:  [place]; Multiple Medical Comorbidities   HPI/Patient Profile: 56 y.o. male  with past medical history of HTN, DM, ESRD on HD as result of HTN and DM, MSSA bacteremia in March of this year, PAD with gangrenous R toe and L heel ulcer, diabetic retinopathy, admitted on 10/24/2021 with shortness of breath and worsening foot wounds. He was septic r/t B fragilis bacteremia- likely from gangrenous toes, also being evaluated for endocarditis. CT indicated L and R pleural effusions and ascites. ECHO indicated 25-30% EF, severe aortic stenosis. Ortho and vascular consulted for his foot wounds- has had aortogram and angioplasty, and vascular feels has been optimized. Ortho deferring amputation until his blood pressure is better controlled as he has been hypotensive. He had a TEE today and results are pending. Palliative consulted for above.   Primary Decision Maker PATIENT- with support of his designated [place]  Discussion: I have reviewed medical records including [place], progress notes from this and prior admissions, labs and imaging, discussed with RN.  On evaluation patient is awake and alert, oriented, able to participate in [place] discussion.   I introduced Palliative Medicine as specialized medical care for people living with serious illness. It focuses on providing relief from the symptoms and stress of a serious illness. The goal is to improve quality of life for both the patient and the family.  We discussed a brief life review of the patient. He is from [place], [place] and has lived there his entire life. 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     As far as functional and nutritional status - he has had ongoing decline since his hospitalization in March. He is able to ambulate to the mailbox, but no further. Completes his own ADL's. Enjoys riding the tractor and 4 wheeler with his friend.   We discussed patient's current illness and what it means in the larger context of patient's on-going co-morbidities.  Natural disease trajectory and expectations at EOL were discussed.  I attempted to elicit values and goals of care important to the patient.  Spending time with his grandchildren is most important to him and brings him great joy. His primary GOC would be to increase his functional status- he would like to be able to walk to the fishing spot on his land and enjoy fishing. He notes this is impossible currently, and on his HD days he can't do much more than go to bed after HD.  The difference between aggressive medical intervention and comfort care was considered in light of the patient's goals of care.   Advance directives, concepts specific to code status, and rehospitalization were considered and discussed. He has Advanced Directives indicating desire for natural death. He and his significant other would like to have a DNR  order understanding that to mean to continue all efforts to keep him alive, however, if he experienced cardiac and respiratory arrest- he would not want to receive CPR.   Discussed with patient/family the importance of continued conversation with family and the medical providers regarding overall plan of care and treatment options, ensuring decisions are within the context of the patient's values and GOCs.    Questions and concerns were addressed. The family was encouraged to call with questions or concerns.      SUMMARY OF RECOMMENDATIONS -Advance directives scanned to [facility] -DNR order entered -Continue current care    Code Status/Advance Care Planning: DNR   Prognosis:   Unable to determine  Discharge  Planning: To Be Determined  Primary Diagnoses: Present on Admission:  Pleural effusion, bilateral  Anemia in CKD (chronic kidney disease)   Review of Systems  Constitutional:  Positive for activity change and fatigue.    Physical Exam Vitals and nursing note reviewed.  Pulmonary:     Effort: Pulmonary effort is normal.  Abdominal:     General: There is distension.  Neurological:     General: No focal deficit present.     Mental Status: He is alert and oriented to person, place, and time.     Vital Signs: BP (!) 85/47 (BP Location: Right Arm)   Pulse 88   Temp 97.6 F (36.4 C) (Oral)   Resp 18   Ht '5\' 9"'$  (1.753 m)   Wt 79.4 kg   SpO2 100%   BMI 25.84 kg/m  Pain Scale: 0-10   Pain Score: 0-No pain   SpO2: SpO2: 100 % O2 Device:SpO2: 100 % O2 Flow Rate: .O2 Flow Rate (L/min): 2 L/min  IO: Intake/output summary:  Intake/Output Summary (Last 24 hours) at 11/29/2021 1504 Last data filed at 11/14/2021 1115 Gross per 24 hour  Intake 1540 ml  Output 0 ml  Net 1540 ml    LBM: Last BM Date : 11/09/2021 Baseline Weight: Weight: 77.1 kg Most recent weight: Weight: 79.4 kg       Thank you for this consult. Palliative medicine will continue to follow and assist as needed.  Time Total: 90 minutes Greater than 50%  of this time was spent counseling and coordinating care related to the above assessment and plan.  Signed by: Mariana Kaufman, AGNP-C Palliative Medicine    Please contact Palliative Medicine Team phone at [phone] for questions and concerns.  For individual provider: See Shea Evans

## 2021-11-10 NOTE — Anesthesia Procedure Notes (Signed)
Procedure Name: MAC Date/Time: 11/05/2021 10:06 AM  Performed by: Amadeo Garnet, CRNAPre-anesthesia Checklist: Patient identified, Emergency Drugs available, Suction available and Patient being monitored Patient Re-evaluated:Patient Re-evaluated prior to induction Oxygen Delivery Method: Nasal cannula Preoxygenation: Pre-oxygenation with 100% oxygen Induction Type: IV induction Placement Confirmation: positive ETCO2 Dental Injury: Teeth and Oropharynx as per pre-operative assessment

## 2021-11-10 NOTE — Progress Notes (Signed)
Pharmacist Heart Failure Core Measure Documentation  Assessment: Balen Woolum has an EF documented as 25% by ECHO.  Rationale: Heart failure patients with left ventricular systolic dysfunction (LVSD) and an EF < 40% should be prescribed an angiotensin converting enzyme inhibitor (ACEI) or angiotensin receptor blocker (ARB) at discharge unless a contraindication is documented in the medical record.  This patient is not currently on an ACEI or ARB for HF.  This note is being placed in the record in order to provide documentation that a contraindication to the use of these agents is present for this encounter.  ACE Inhibitor or Angiotensin Receptor Blocker is contraindicated (specify all that apply)  '[]'$   ACEI allergy AND ARB allergy '[]'$   Angioedema '[]'$   Moderate or severe aortic stenosis '[]'$   Hyperkalemia '[x]'$   Hypotension '[]'$   Renal artery stenosis '[x]'$   Worsening renal function, preexisting renal disease or dysfunction   Bonnita Nasuti Pharm.D. CPP, BCPS Clinical Pharmacist [phone] 11/25/2021 2:45 PM

## 2021-11-10 NOTE — Transfer of Care (Signed)
Immediate Anesthesia Transfer of Care Note  Patient: Paul Gonzalez  Procedure(s) Performed: TRANSESOPHAGEAL ECHOCARDIOGRAM (TEE)  Patient Location: PACU  Anesthesia Type:MAC  Level of Consciousness: drowsy and patient cooperative  Airway & Oxygen Therapy: Patient Spontanous Breathing and Patient connected to nasal cannula oxygen  Post-op Assessment: Report given to RN, Post -op Vital signs reviewed and stable, and Patient moving all extremities  Post vital signs: Reviewed and stable  Last Vitals:  Vitals Value Taken Time  BP 93/69 11/18/2021 1040  Temp 36.6 C 11/21/2021 1035  Pulse 86 11/26/2021 1040  Resp 16 11/18/2021 1040  SpO2 95 % 11/20/2021 1040  Vitals shown include unvalidated device data.  Last Pain:  Vitals:   11/24/2021 1035  TempSrc: Temporal  PainSc: Asleep         Complications: No notable events documented.

## 2021-11-11 ENCOUNTER — Inpatient Hospital Stay (HOSPITAL_COMMUNITY): Payer: Medicare Other

## 2021-11-11 DIAGNOSIS — I96 Gangrene, not elsewhere classified: Secondary | ICD-10-CM | POA: Diagnosis not present

## 2021-11-11 DIAGNOSIS — I359 Nonrheumatic aortic valve disorder, unspecified: Secondary | ICD-10-CM | POA: Diagnosis not present

## 2021-11-11 DIAGNOSIS — I358 Other nonrheumatic aortic valve disorders: Secondary | ICD-10-CM | POA: Diagnosis not present

## 2021-11-11 DIAGNOSIS — I351 Nonrheumatic aortic (valve) insufficiency: Secondary | ICD-10-CM

## 2021-11-11 DIAGNOSIS — I959 Hypotension, unspecified: Secondary | ICD-10-CM | POA: Diagnosis not present

## 2021-11-11 DIAGNOSIS — J90 Pleural effusion, not elsewhere classified: Secondary | ICD-10-CM | POA: Diagnosis not present

## 2021-11-11 DIAGNOSIS — I5043 Acute on chronic combined systolic (congestive) and diastolic (congestive) heart failure: Secondary | ICD-10-CM

## 2021-11-11 DIAGNOSIS — E11628 Type 2 diabetes mellitus with other skin complications: Secondary | ICD-10-CM | POA: Diagnosis not present

## 2021-11-11 DIAGNOSIS — I5021 Acute systolic (congestive) heart failure: Secondary | ICD-10-CM | POA: Diagnosis not present

## 2021-11-11 LAB — CBC WITH DIFFERENTIAL/PLATELET
Abs Immature Granulocytes: 0.17 10*3/uL — ABNORMAL HIGH (ref 0.00–0.07)
Basophils Absolute: 0.1 10*3/uL (ref 0.0–0.1)
Basophils Relative: 1 %
Eosinophils Absolute: 0.3 10*3/uL (ref 0.0–0.5)
Eosinophils Relative: 2 %
HCT: 35.3 % — ABNORMAL LOW (ref 39.0–52.0)
Hemoglobin: 11.7 g/dL — ABNORMAL LOW (ref 13.0–17.0)
Immature Granulocytes: 1 %
Lymphocytes Relative: 3 %
Lymphs Abs: 0.5 10*3/uL — ABNORMAL LOW (ref 0.7–4.0)
MCH: 29 pg (ref 26.0–34.0)
MCHC: 33.1 g/dL (ref 30.0–36.0)
MCV: 87.6 fL (ref 80.0–100.0)
Monocytes Absolute: 1.1 10*3/uL — ABNORMAL HIGH (ref 0.1–1.0)
Monocytes Relative: 5 %
Neutro Abs: 18.6 10*3/uL — ABNORMAL HIGH (ref 1.7–7.7)
Neutrophils Relative %: 88 %
Platelets: 253 10*3/uL (ref 150–400)
RBC: 4.03 MIL/uL — ABNORMAL LOW (ref 4.22–5.81)
RDW: 20.5 % — ABNORMAL HIGH (ref 11.5–15.5)
WBC: 20.9 10*3/uL — ABNORMAL HIGH (ref 4.0–10.5)
nRBC: 0 % (ref 0.0–0.2)

## 2021-11-11 LAB — GLUCOSE, CAPILLARY
Glucose-Capillary: 181 mg/dL — ABNORMAL HIGH (ref 70–99)
Glucose-Capillary: 177 mg/dL — ABNORMAL HIGH (ref 70–99)

## 2021-11-11 LAB — RENAL FUNCTION PANEL
Albumin: 2.4 g/dL — ABNORMAL LOW (ref 3.5–5.0)
Anion gap: 19 — ABNORMAL HIGH (ref 5–15)
BUN: 48 mg/dL — ABNORMAL HIGH (ref 6–20)
CO2: 23 mmol/L (ref 22–32)
Calcium: 9.4 mg/dL (ref 8.9–10.3)
Chloride: 87 mmol/L — ABNORMAL LOW (ref 98–111)
Creatinine, Ser: 6.16 mg/dL — ABNORMAL HIGH (ref 0.61–1.24)
GFR, Estimated: 10 mL/min — ABNORMAL LOW (ref >60)
Glucose, Bld: 173 mg/dL — ABNORMAL HIGH (ref 70–99)
Phosphorus: 6.3 mg/dL — ABNORMAL HIGH (ref 2.5–4.6)
Potassium: 4 mmol/L (ref 3.5–5.1)
Sodium: 129 mmol/L — ABNORMAL LOW (ref 135–145)

## 2021-11-11 LAB — IRON AND TIBC
Iron: 26 ug/dL — ABNORMAL LOW (ref 45–182)
Saturation Ratios: 15 % — ABNORMAL LOW (ref 17.9–39.5)
TIBC: 175 ug/dL — ABNORMAL LOW (ref 250–450)
UIBC: 149 ug/dL

## 2021-11-11 LAB — COMPREHENSIVE METABOLIC PANEL
ALT: 21 U/L (ref 0–44)
AST: 19 U/L (ref 15–41)
Albumin: 2.5 g/dL — ABNORMAL LOW (ref 3.5–5.0)
Alkaline Phosphatase: 125 U/L (ref 38–126)
Anion gap: 19 — ABNORMAL HIGH (ref 5–15)
BUN: 48 mg/dL — ABNORMAL HIGH (ref 6–20)
CO2: 23 mmol/L (ref 22–32)
Calcium: 9.5 mg/dL (ref 8.9–10.3)
Chloride: 87 mmol/L — ABNORMAL LOW (ref 98–111)
Creatinine, Ser: 6.25 mg/dL — ABNORMAL HIGH (ref 0.61–1.24)
GFR, Estimated: 10 mL/min — ABNORMAL LOW (ref >60)
Glucose, Bld: 173 mg/dL — ABNORMAL HIGH (ref 70–99)
Potassium: 4 mmol/L (ref 3.5–5.1)
Sodium: 129 mmol/L — ABNORMAL LOW (ref 135–145)
Total Bilirubin: 1 mg/dL (ref 0.3–1.2)
Total Protein: 6.6 g/dL (ref 6.5–8.1)

## 2021-11-11 LAB — RETICULOCYTES
Immature Retic Fract: 19.2 % — ABNORMAL HIGH (ref 2.3–15.9)
RBC.: 3.87 MIL/uL — ABNORMAL LOW (ref 4.22–5.81)
Retic Count, Absolute: 108.4 10*3/uL (ref 19.0–186.0)
Retic Ct Pct: 2.8 % (ref 0.4–3.1)

## 2021-11-11 LAB — PHOSPHORUS: Phosphorus: 6.2 mg/dL — ABNORMAL HIGH (ref 2.5–4.6)

## 2021-11-11 LAB — FERRITIN: Ferritin: 824 ng/mL — ABNORMAL HIGH (ref 24–336)

## 2021-11-11 LAB — VITAMIN B12: Vitamin B-12: 674 pg/mL (ref 180–914)

## 2021-11-11 LAB — MAGNESIUM: Magnesium: 1.8 mg/dL (ref 1.7–2.4)

## 2021-11-11 LAB — FOLATE: Folate: 16 ng/mL (ref >5.9)

## 2021-11-11 MED ORDER — SODIUM CHLORIDE 0.9% FLUSH: 3.0000 mL | Freq: Two times a day (BID) | INTRAVENOUS | Status: DC

## 2021-11-13 ENCOUNTER — Encounter (HOSPITAL_COMMUNITY): Payer: Self-pay | Admitting: Cardiology

## 2021-11-13 SURGERY — RIGHT/LEFT HEART CATH AND CORONARY ANGIOGRAPHY
Anesthesia: LOCAL

## 2021-11-30 MED FILL — Medication: Qty: 1 | Status: AC

## 2021-12-04 NOTE — Progress Notes (Signed)
Pharmacy Antibiotic Note  Paul Gonzalez is a 56 y.o. 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 Pt ESRD on HD MWF.  Plans noted for possible valve surgery but needs other procedures first (toe amputation and cath)    Plan: Continue Unasyn 1.5g q12hrs Monitor labs, c/s, and patient improvement   Height: '5\' 9"'$  (175.3 cm) Weight: 79.4 kg (175 lb) IBW/kg (Calculated) : 70.7  Temp (24hrs), Avg:97.9 F (36.6 C), Min:97.5 F (36.4 C), Max:98.7 F (37.1 C)  Recent Labs  Lab 11/05/2021 0034 11/07/21 0054 11/08/21 0054 11/09/21 0030 11/23/2021 0049 11-24-2021 0735  WBC 15.0* 18.0* 16.4* 16.7* 16.8* 20.9*  CREATININE 4.58* 4.58* 5.57* 6.50* 5.34*  --      Estimated Creatinine Clearance: 15.4 mL/min (A) (by C-G formula based on SCr of 5.34 mg/dL (H)).    No Known Allergies  Antimicrobials this admission: Zosyn 10/30 >> 11/5 Vanco/Cefepime 10/28 >> 10/30 CTX/azith 10/28 Unasyn 11/5 >>   Microbiology results: 10/28 blood>> bacteroides fragilis   10/30 blood x2- neg  Hildred Laser, PharmD Clinical Pharmacist **Pharmacist phone directory can now be found on [email or web address] ([email or web address]).  Listed under [place].

## 2021-12-04 NOTE — Progress Notes (Signed)
[facility]  PROGRESS NOTE  Paul Gonzalez XLK:[id number] DOB: 1965/07/25 DOA: 10/08/2021 PCP: Coral Spikes, DO   Brief HPI:   Brief Narrative:  56 y.o. male with medical history significant of Hypertension, hyperlipidemia, ESRD on HD (MWF), anemia of chronic disease, diabetic retinopathy with vision loss who presents to the emergency department due to worsening shortness of breath that has been ongoing for about 2 weeks, this was associated with nonproductive cough.  He  also complained of bilateral diabetic foot ulcers and states that he has not been able to get an appointment with a podiatrist.  In the emergency department, Right foot x-ray showed no fracture or dislocation of the right foot.  No radiographic findings of osteomyelitis.  Soft tissue wound over the medial great toe.  Soft tissue edema of the forefoot.  Chest x-ray showed cardiomegaly without failure.  Bilateral pleural effusion and associated airspace disease, likely atelectasis, right greater than left.  He was treated with IV ceftriaxone, vancomycin and azithromycin   Orthopedic surgery evaluated and recommended ABIs and also recommended vascular evaluation.    Vascular surgery saw patient in the s/p right SFA and PTA angioplasty Patient is optimized from vascular surgery standpoint with peroneal only runoff of the left lower extremity Patient was found to have B fragilis bacteremia TTE done which showed bacterial endocarditis and vegetation CT surgery was consulted, patient found to have aortic valve endocarditis CT surgery recommended amputation of left toe before aortic valve replacement   Subjective   Patient seen and examined, denies any complaints.   Assessment/Plan:    Sepsis due to B fragilis bacteremia/concomitant gangrene right toe with cellulitis -Blood cultures positive for B fragilis, started on IV Zosyn which was changed to IV Unasyn per ID -Patient had aortogram of lower extremitywith lower  extremity arteriogram done given that there is tissue loss and he had a right posterior tibial artery angioplasty and right mid to distal SFV drug-coated balloon angioplasty and has been optimized in the right lower extremity after SFA and posterior tibial intervention with inline flow down the right lower extremity with via the posterior tibial; patient was loaded with Plavix and aspirin and statin and they feel that if the right foot wounds fail to heal he would be a candidate for retrograde right AT access  -We will reconsult orthopedics for possible amputation, as infected toe will be likely source for persistent bacteremia which can affect the valve even after surgery  Aortic valve endocarditis -Transesophageal confirmed severe aortic insufficiency due to aortic valve vegetation -CT surgery for surgical AVR despite him being very high risk; but will likely need amputation before surgery for valve -Dr. Sharol Given was reconsulted today  New finding of cardiomyopathy/acute systolic CHF with HFrEF -Echocardiogram showed EF of 20 to 25% with severely decreased function of left ventricle demonstrating global hypokinesis and left ventricular diastolic parameters consistent with grade 3 diastolic dysfunction -BNP was greater than 4500; cardiology was consulted -Patient not on GDMT due to hypotension requiring midodrine and ESRD -Volume maintenance per dialysis -CHF team following  ESRD on hemodialysis -Patient on dialysis MWF -Nephrology consulted  Bilateral pleural effusion right more than left -Underwent thoracentesis for right large effusion on 10/30 with 1.5 L fluid removed -Not having symptoms of respiratory distress  Diabetes mellitus type 2 -Continue Lantus 12 units subcu daily -continue sliding scale insulin NovoLog -CBG well controlled  Chronic hypotension -Patient is on midodrine -Dose increased by nephrology due to soft BPs  Hyponatremia -Chronically low sodium, today sodium  is  129 -Patient on dialysis, nephrology following  Thrombocytopenia -Platelet count is improving     Medications     aspirin EC  81 mg Oral Daily   atorvastatin  20 mg Oral Daily   calcitRIOL  0.25 mcg Oral Q M,W,F-HD   Chlorhexidine Gluconate Cloth  6 each Topical Q0600   Chlorhexidine Gluconate Cloth  6 each Topical Q0600   Chlorhexidine Gluconate Cloth  6 each Topical Q0600   chlorpheniramine-HYDROcodone  5 mL Oral Once   clopidogrel  75 mg Oral Daily   feeding supplement (GLUCERNA SHAKE)  237 mL Oral TID BM   ferrous sulfate  325 mg Oral Q breakfast   insulin aspart  0-5 Units Subcutaneous QHS   insulin aspart  0-6 Units Subcutaneous TID WC   insulin glargine-yfgn  12 Units Subcutaneous QHS   liver oil-zinc oxide   Topical BID   midodrine  20 mg Oral TID WC   pantoprazole  40 mg Oral Daily   saccharomyces boulardii  250 mg Oral BID   sevelamer carbonate  1,600 mg Oral TID WC   sodium chloride flush  3 mL Intravenous Q12H     Data Reviewed:   CBG:  Recent Labs  Lab 11/05/2021 0949 11/20/2021 1118 11/14/2021 1558 11/13/2021 2128 18-Nov-2021 0601  GLUCAP 177* 202* 199* 262* 181*    SpO2: 96 % O2 Flow Rate (L/min): 3 L/min FiO2 (%): (!) 0 %    Vitals:   18-Nov-2021 1230 11/18/2021 1300 Nov 18, 2021 1340 11/18/2021 1408  BP: 110/60 112/69    Pulse:  (!) 58  100  Resp:  (!) 25  (!) 23  Temp:   97.6 F (36.4 C) (!) 96.8 F (36 C)  TempSrc:      SpO2:   100% 96%  Weight:    85.3 kg  Height:          Data Reviewed:  Basic Metabolic Panel: Recent Labs  Lab 11/07/21 0054 11/08/21 0054 11/09/21 0030 12/01/2021 0049 2021/11/18 0735 18-Nov-2021 0736  NA 132* 128* 128* 132* 129* 129*  K 3.6 3.9 4.3 4.6 4.0 4.0  CL 95* 87* 86* 87* 87* 87*  CO2 '25 24 25 28 23 23  '$ GLUCOSE 203* 215* 183* 182* 173* 173*  BUN 27* 38* 45* 34* 48* 48*  CREATININE 4.58* 5.57* 6.50* 5.34* 6.25* 6.16*  CALCIUM 9.1 9.1 9.2 9.5 9.5 9.4  MG 1.8 1.8 1.9 1.9 1.8  --   PHOS 4.3 5.5* 6.8* 5.6* 6.2* 6.3*     CBC: Recent Labs  Lab 11/07/21 0054 11/08/21 0054 11/09/21 0030 11/19/2021 0049 November 18, 2021 0735  WBC 18.0* 16.4* 16.7* 16.8* 20.9*  NEUTROABS 16.2* 14.3* 14.7* 14.9* 18.6*  HGB 11.0* 10.8* 10.8* 10.9* 11.7*  HCT 33.7* 33.2* 34.4* 35.7* 35.3*  MCV 87.5 88.3 90.5 92.0 87.6  PLT 186 196 222 237 253    LFT Recent Labs  Lab 11/07/21 0054 11/08/21 0054 11/09/21 0030 11/09/2021 0049 11/18/2021 0735 Nov 18, 2021 0736  AST '19 18 19 21 19  '$ --   ALT '23 21 19 21 21  '$ --   ALKPHOS 112 114 118 125 125  --   BILITOT 1.2 1.2 0.9 0.8 1.0  --   PROT 6.2* 6.0* 6.2* 6.4* 6.6  --   ALBUMIN 2.4* 2.2* 2.3* 2.3* 2.5* 2.4*     Antibiotics: Anti-infectives (From admission, onward)    Start     Dose/Rate Route Frequency Ordered Stop   11/08/21 1500  ampicillin-sulbactam (UNASYN) 1.5 g in sodium  chloride 0.9 % 100 mL IVPB        1.5 g 200 mL/hr over 30 Minutes Intravenous Every 12 hours 11/08/21 0922     11/04/21 2300  piperacillin-tazobactam (ZOSYN) IVPB 2.25 g  Status:  Discontinued        2.25 g 100 mL/hr over 30 Minutes Intravenous Every 8 hours 11/03/21 2305 11/04/21 0004   11/04/21 0015  piperacillin-tazobactam (ZOSYN) IVPB 2.25 g  Status:  Discontinued        2.25 g 100 mL/hr over 30 Minutes Intravenous Every 8 hours 11/04/21 0005 11/08/21 0852   11/02/21 1800  ceFEPIme (MAXIPIME) 2 g in sodium chloride 0.9 % 100 mL IVPB  Status:  Discontinued        2 g 200 mL/hr over 30 Minutes Intravenous Every M-W-F (1800) 10/25/2021 2119 11/02/21 0947   11/02/21 1200  vancomycin (VANCOREADY) IVPB 750 mg/150 mL  Status:  Discontinued        750 mg 150 mL/hr over 60 Minutes Intravenous Every M-W-F (Hemodialysis) 10/12/2021 2119 11/02/21 0948   11/02/21 1200  piperacillin-tazobactam (ZOSYN) IVPB 2.25 g  Status:  Discontinued        2.25 g 100 mL/hr over 30 Minutes Intravenous Every 8 hours 11/02/21 0952 11/02/21 1014   11/02/21 1200  piperacillin-tazobactam (ZOSYN) 2.25 g in sodium chloride 0.9 % 50 mL IVPB   Status:  Discontinued        2.25 g 100 mL/hr over 30 Minutes Intravenous Every 8 hours 11/02/21 1014 11/03/21 2305   10/14/2021 2130  vancomycin (VANCOREADY) IVPB 500 mg/100 mL        500 mg 100 mL/hr over 60 Minutes Intravenous  Once 10/25/2021 2119 11/01/21 0152   10/29/2021 2100  vancomycin (VANCOREADY) IVPB 1500 mg/300 mL  Status:  Discontinued        1,500 mg 150 mL/hr over 120 Minutes Intravenous  Once 10/22/2021 2057 10/13/2021 2119   10/15/2021 2100  ceFEPIme (MAXIPIME) 2 g in sodium chloride 0.9 % 100 mL IVPB        2 g 200 mL/hr over 30 Minutes Intravenous  Once 10/17/2021 2057 11/01/21 0152   10/30/2021 1630  vancomycin (VANCOCIN) IVPB 1000 mg/200 mL premix        1,000 mg 200 mL/hr over 60 Minutes Intravenous  Once 11/03/2021 1628 10/07/2021 1818   10/08/2021 1530  cefTRIAXone (ROCEPHIN) 2 g in sodium chloride 0.9 % 100 mL IVPB  Status:  Discontinued        2 g 200 mL/hr over 30 Minutes Intravenous Every 24 hours 10/30/2021 1522 11/02/2021 2119   11/01/2021 1530  azithromycin (ZITHROMAX) 500 mg in sodium chloride 0.9 % 250 mL IVPB  Status:  Discontinued        500 mg 250 mL/hr over 60 Minutes Intravenous Every 24 hours 10/20/2021 1522 11/02/21 0947        DVT prophylaxis: SCDs  Code Status: DNR  Family Communication: Discussed with patient wife at bedside   CONSULTS CT surgery, cardiology, nephrology, infectious disease, orthopedics   Objective    Physical Examination:   General: Appears in no acute distress Cardiovascular: S1-S2, regular, no murmur auscultated Respiratory: Clear to auscultation bilaterally Abdomen: Abdomen is soft, nontender, no organomegaly Extremities: No edema in the lower extremities Neurologic: Alert, oriented x3, flat affect, normal mood   Status is: Inpatient:             Oswald Hillock   [facility] If 7PM-7AM, please contact night-coverage at [email or web address], Office  [phone]   12-11-21, 4:35 PM  LOS: 11 days

## 2021-12-04 NOTE — Progress Notes (Signed)
Received patient in bed to unit.  Alert and oriented.  Informed consent signed and in chart.   Treatment initiated: 0927 Treatment completed: 1327  Patient tolerated well.  Transported back to the room  Alert, without acute distress.  Hand-off given to patient's nurse.   Access used: AVF Access issues: none  Total UF removed: 1.5 L Medication(s) given: Calcitriol 0,25 mcg PO. Benadryl 25 mg PO. Midodrine 20 mg PO Post HD VS: 110/72 P 100 R 20 Post HD weight: 85 KG   [facility]

## 2021-12-04 NOTE — Progress Notes (Signed)
This chaplain revisited the Pt. to clarify the Pt. request for a priest. The Pt. recently returned from HD. Paul Gonzalez, identified herself as the Pt. wife, is at the bedside.   The chaplain understands the Pt. is requesting spiritual care after receiving today's update of his high risk for cardiac surgery. The chaplain understands a Catholic priest is not necessary.  The chaplain listened reflectively as the Pt. described "family" as a source of support and as a reference point for living with serious illness.  The Pt. has three grand children and two great grand dogs. The chaplain understands the Pt. oldest grandson will graduate as a Therapist, sports in May. [name]'s companionship was included in all of the Pt. discussions about quality of life.   The chaplain exited as the Pt. evening meal arrived. The chaplain understands the Pt. will contact the chaplain and/or PMT as needed.  Chaplain Sallyanne Kuster [phone]

## 2021-12-04 NOTE — Procedures (Signed)
Patient seen on Hemodialysis. BP 94/73 (BP Location: Right Arm)   Pulse 94   Temp 97.6 F (36.4 C)   Resp 18   Ht '5\' 9"'$  (1.753 m)   Wt 86.4 kg   SpO2 100%   BMI 28.13 kg/m   QB 400, UF goal 2L Tolerating treatment without complaints at this time.   Elmarie Shiley MD [facility]. Office # [phone] Pager # [phone] 10:36 AM

## 2021-12-04 NOTE — Code Documentation (Signed)
Rapid Response Event Note   Reason for Call :  1815 Pt staring into space 1818 Called back unresponsive with pulse 1819 Called back, patient SBP 71/58; code blue activated as RRT arrived to unit  Initial Focused Assessment:  Pt initially with weak pulse, HR 30s, BP 71/58. Agonal respirations, requiring BVM. Patient noted to have received oxy upon retrieving narcan, patient PEA, HR 32, compressions initiated at 1820. '1mg'$  IV Epi given at 1821. Compressions continued, narcan 0.'4mg'$  given at 1824. At this time pt noted to be DNR, resuscitation event terminated at this time. Wife at bedside, aware of events.   Event Summary:  MD Notified: Georgiann Mohs MD Call Time: [place] Time: 1819 End Time: [id number]  Newman Nickels, RN

## 2021-12-04 NOTE — Consult Note (Addendum)
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 right/left heart cath.  2. Endocarditis: Aortic valve endocarditis.  B fragilis bacteremia but unusual agent for endocarditis.  Has had MSSA in past.  Discussed with ID, suspect probably MSSA endocarditis but have not proven.  - Continue Unasyn, covers B fragilis and MSSA.  - As above, high risk for seeding bioprosthetic AoV if SAVR is done unless he has BKA.  3. PAD: Patient has ulcerations on both feet, dry gangrene right great toe. S/p right PT artery angioplasty and right SFA balloon angioplasty 11/05/2021.  He denies pain.  - Currently on ASA 81, Plavix, statin.  - Will need to discuss future management with VVS/orthopedics.  For lowest risk for future bacteremia/endocarditis, would need amputation. However, would be very hard for him to rehab post-SAVR with BKA.  4. Acute systolic CHF: Echo this admission with LVEF 25%-30%, RV mod reduced, in setting of severe AS/AI.  Prior echo in 3/23 with normal EF.  Most likely ischemic cardiomyopathy though cannot rule out valvular cardiomyopathy with severe AS and  severe AI. Mild volume overload on exam.  - Has been hypotensive requiring midodrine 20 tid, unable to start GDMT.  - Manage volume via HD.  - Strong suspicion for CAD though no chest pain.  If we continue surgical consideration, will need RHC/LHC.  Tentatively arrange for Friday.  5. ESRD: HD per nephrology.  6. Blindness.   Overall, as above, very high risk for SAVR. Options are quite limited by multiple comorbidities.   Loralie Champagne 12/07/21 3:47 PM

## 2021-12-04 NOTE — Progress Notes (Signed)
This chaplain responded to the spiritual care consult for a priest visit. The chaplain is appreciative of the RN's update.  The Pt. is out of the room for HD at the time of the chaplain's visit. The chaplain will attempt a revisit for assessing the Pt. spiritual care needs.   Chaplain Sallyanne Kuster [phone]

## 2021-12-04 NOTE — Progress Notes (Signed)
Pt wife walked pt from the bath room to bed and ran out to the hallway calling for help. Pt laying in bed eyes fixated to the wall non responsive. BP 70/40sHR 30-40s RRT called, MD notified. Called Code started one round of CPR 1 epi given 1 narcan given. RRT spoke with wife and wife agreed to follow his wishes, Pt. DNR. Called time of death 79.

## 2021-12-04 NOTE — Progress Notes (Signed)
Cardiology Progress Note  Patient ID: Paul Gonzalez MRN: [id number] DOB: 06-02-1965 Date of Encounter: Nov 27, 2021  Primary Cardiologist: Larae Grooms, MD  Subjective   Chief Complaint: none.   HPI: Transesophageal echo with aortic valve vegetation.  Severe aortic insufficiency.  Will need optimization for potential cardiac surgery.  ROS:  All other ROS reviewed and negative. Pertinent positives noted in the HPI.     Inpatient Medications  Scheduled Meds:  aspirin EC  81 mg Oral Daily   atorvastatin  20 mg Oral Daily   calcitRIOL  0.25 mcg Oral Q M,W,F-HD   Chlorhexidine Gluconate Cloth  6 each Topical Q0600   Chlorhexidine Gluconate Cloth  6 each Topical Q0600   Chlorhexidine Gluconate Cloth  6 each Topical Q0600   chlorpheniramine-HYDROcodone  5 mL Oral Once   clopidogrel  75 mg Oral Daily   feeding supplement (GLUCERNA SHAKE)  237 mL Oral TID BM   ferrous sulfate  325 mg Oral Q breakfast   insulin aspart  0-5 Units Subcutaneous QHS   insulin aspart  0-6 Units Subcutaneous TID WC   insulin glargine-yfgn  12 Units Subcutaneous QHS   liver oil-zinc oxide   Topical BID   midodrine  20 mg Oral TID WC   pantoprazole  40 mg Oral Daily   saccharomyces boulardii  250 mg Oral BID   sevelamer carbonate  1,600 mg Oral TID WC   sodium chloride flush  3 mL Intravenous Q12H   Continuous Infusions:  sodium chloride Stopped (11/08/21 0858)   ampicillin-sulbactam (UNASYN) IV 1.5 g (11/05/2021 2031)   anticoagulant sodium citrate     PRN Meds: sodium chloride, acetaminophen **OR** acetaminophen, acetaminophen, alteplase, anticoagulant sodium citrate, diphenhydrAMINE, guaiFENesin, heparin, heparin, hydrALAZINE, HYDROmorphone (DILAUDID) injection, labetalol, lidocaine (PF), lidocaine-prilocaine, melatonin, ondansetron **OR** ondansetron (ZOFRAN) IV, ondansetron (ZOFRAN) IV, oxyCODONE, pentafluoroprop-tetrafluoroeth, sodium chloride flush   Vital Signs   Vitals:   11/18/2021 1122  11/24/2021 1918 November 27, 2021 0602 2021-11-27 0750  BP: (!) 85/47 (!) 90/42 (!) 98/43 95/60  Pulse: 88 87 99 100  Resp: '18 18 18   '$ Temp: 97.6 F (36.4 C) (!) 97.5 F (36.4 C) 97.9 F (36.6 C) 98.7 F (37.1 C)  TempSrc: Oral Oral Oral Oral  SpO2: 100% 100% 91%   Weight:      Height:        Intake/Output Summary (Last 24 hours) at November 27, 2021 0844 Last data filed at 2021-11-27 0000 Gross per 24 hour  Intake 1374 ml  Output --  Net 1374 ml      11/09/2021    9:49 AM 11/20/2021    4:03 AM 11/09/2021    1:00 PM  Last 3 Weights  Weight (lbs) 175 lb 184 lb 11.9 oz 185 lb 10 oz  Weight (kg) 79.379 kg 83.8 kg 84.2 kg      Telemetry  Overnight telemetry shows sinus tachycardia 100s, which I personally reviewed.     Physical Exam   Vitals:   11/17/2021 1122 11/04/2021 1918 2021-11-27 0602 11/27/21 0750  BP: (!) 85/47 (!) 90/42 (!) 98/43 95/60  Pulse: 88 87 99 100  Resp: '18 18 18   '$ Temp: 97.6 F (36.4 C) (!) 97.5 F (36.4 C) 97.9 F (36.6 C) 98.7 F (37.1 C)  TempSrc: Oral Oral Oral Oral  SpO2: 100% 100% 91%   Weight:      Height:        Intake/Output Summary (Last 24 hours) at Nov 27, 2021 0844 Last data filed at 11/27/2021 0000 Gross per 24 hour  Intake 1374 ml  Output --  Net 1374 ml       11/20/2021    9:49 AM 11/05/2021    4:03 AM 11/09/2021    1:00 PM  Last 3 Weights  Weight (lbs) 175 lb 184 lb 11.9 oz 185 lb 10 oz  Weight (kg) 79.379 kg 83.8 kg 84.2 kg    Body mass index is 25.84 kg/m.  General: Well nourished, well developed, in no acute distress Head: Atraumatic, normal size  Eyes: PEERLA, EOMI  Neck: Supple, JVD 10-12 cmH2O Endocrine: No thryomegaly Cardiac: Normal S1, S2; tachycardia, 3-6 systolic ejection murmur, late peaking Lungs: Diminished breath sounds bilaterally Abd: Soft, nontender, no hepatomegaly  Ext: Right toe gangrene, absent pulses, 1+ pitting edema Musculoskeletal: No deformities, BUE and BLE strength normal and equal Skin: Warm and dry, no rashes    Neuro: Alert and oriented to person, place, time, and situation, CNII-XII grossly intact, no focal deficits  Psych: Normal mood and affect   Labs  High Sensitivity Troponin:  No results for input(s): "TROPONINIHS" in the last 720 hours.   Cardiac EnzymesNo results for input(s): "TROPONINI" in the last 168 hours. No results for input(s): "TROPIPOC" in the last 168 hours.  Chemistry Recent Labs  Lab 11/09/21 0030 11/16/2021 0049 Nov 23, 2021 0735 2021-11-23 0736  NA 128* 132* 129* 129*  K 4.3 4.6 4.0 4.0  CL 86* 87* 87* 87*  CO2 '25 28 23 23  '$ GLUCOSE 183* 182* 173* 173*  BUN 45* 34* 48* 48*  CREATININE 6.50* 5.34* 6.25* 6.16*  CALCIUM 9.2 9.5 9.5 9.4  PROT 6.2* 6.4* 6.6  --   ALBUMIN 2.3* 2.3* 2.5* 2.4*  AST '19 21 19  '$ --   ALT '19 21 21  '$ --   ALKPHOS 118 125 125  --   BILITOT 0.9 0.8 1.0  --   GFRNONAA 9* 12* 10* 10*  ANIONGAP 17* 17* 19* 19*    Hematology Recent Labs  Lab 11/09/21 0030 11/28/2021 0049 11/23/2021 0043 Nov 23, 2021 0735  WBC 16.7* 16.8*  --  20.9*  RBC 3.80* 3.88* 3.87* 4.03*  HGB 10.8* 10.9*  --  11.7*  HCT 34.4* 35.7*  --  35.3*  MCV 90.5 92.0  --  87.6  MCH 28.4 28.1  --  29.0  MCHC 31.4 30.5  --  33.1  RDW 20.9* 21.2*  --  20.5*  PLT 222 237  --  253   BNPNo results for input(s): "BNP", "PROBNP" in the last 168 hours.  DDimer No results for input(s): "DDIMER" in the last 168 hours.   Radiology  DG CHEST PORT 1 VIEW  Result Date: 2021-11-23 CLINICAL DATA:  Shortness of breath EXAM: PORTABLE CHEST 1 VIEW COMPARISON:  Portable exam 0513 hours compared to 11/09/2021 FINDINGS: Borderline enlargement of cardiac silhouette. Atherosclerotic calcification aorta. Persistent small BILATERAL pleural effusions. Bibasilar atelectasis. Accentuated interstitial markings question mild edema. No pneumothorax or acute osseous findings. IMPRESSION: Persistent bibasilar effusions and atelectasis. Question minimal pulmonary edema. Electronically Signed   By: Lavonia Dana M.D.   On:  23-Nov-2021 08:37   ECHO TEE  Result Date: 11/12/2021    TRANSESOPHOGEAL ECHO REPORT   Patient Name:   Paul Gonzalez Date of Exam: 11/05/2021 Medical Rec #:  [id number]       Height:       69.0 in Accession #:    [id number]      Weight:       184.7 lb Date of Birth:  [birth year]  BSA:          1.998 m Patient Age:    78 years        BP:           69/38 mmHg Patient Gender: M               HR:           86 bpm. Exam Location:  Inpatient Procedure: Transesophageal Echo, 3D Echo, Cardiac Doppler and Color Doppler Indications:     Nonrheumatic aortic (valve) stenosis I35.0  History:         Patient has prior history of Echocardiogram examinations, most                  recent 11/01/2021. CHF, Aortic Valve Disease; Risk                  Factors:Hypertension, Diabetes and Dyslipidemia. Chronic kidney                  disease.  Sonographer:     Darlina Sicilian RDCS Referring Phys:  [place] Phys: Kirk Ruths MD PROCEDURE: After discussion of the risks and benefits of a TEE, an informed consent was obtained from the patient. TEE procedure time was 17 minutes. The transesophogeal probe was passed without difficulty through the esophogus of the patient. Imaged were obtained with the patient in a left lateral decubitus position. Sedation performed by different physician. The patient was monitored while under deep sedation. Anesthestetic sedation was provided intravenously by Anesthesiology: 92.'1mg'$  of Propofol, '80mg'$  of Lidocaine. Image quality was good. The patient's vital signs; including heart rate, blood pressure, and oxygen saturation; remained stable throughout the procedure. The patient developed no complications during the procedure.  IMPRESSIONS  1. Tricuspid aortic valve with reduced cusp excursion; oscillating density on noncoronary cusp concerning for vegetation; possible perforation of noncoronary cusp; severe AS with mean gradient 37 mmHg and AVA 0.6 cm2; severe AI.  2. Left  ventricular ejection fraction, by estimation, is 25 to 30%. The left ventricle has severely decreased function. The left ventricle demonstrates global hypokinesis. The left ventricular internal cavity size was mildly dilated.  3. Right ventricular systolic function is moderately reduced. The right ventricular size is normal.  4. Left atrial size was moderately dilated. No left atrial/left atrial appendage thrombus was detected.  5. Right atrial size was mildly dilated.  6. Small to moderate pericardial effusion.  7. The mitral valve is normal in structure. Mild to moderate mitral valve regurgitation.  8. The aortic valve is tricuspid. Aortic valve regurgitation is severe. Severe aortic valve stenosis.  9. There is mild (Grade II) plaque involving the descending aorta. FINDINGS  Left Ventricle: Left ventricular ejection fraction, by estimation, is 25 to 30%. The left ventricle has severely decreased function. The left ventricle demonstrates global hypokinesis. The left ventricular internal cavity size was mildly dilated. Right Ventricle: The right ventricular size is normal. Right ventricular systolic function is moderately reduced. Left Atrium: Left atrial size was moderately dilated. No left atrial/left atrial appendage thrombus was detected. Right Atrium: Right atrial size was mildly dilated. Pericardium: Small to moderate pericardial effusion. Mitral Valve: The mitral valve is normal in structure. Mild mitral annular calcification. Mild to moderate mitral valve regurgitation. MV peak gradient, 3.9 mmHg. The mean mitral valve gradient is 1.0 mmHg. Tricuspid Valve: The tricuspid valve is normal in structure. Tricuspid valve regurgitation is mild. Aortic Valve: The aortic valve is tricuspid. Aortic valve regurgitation  is severe. Aortic regurgitation PHT measures 153 msec. Severe aortic stenosis is present. Aortic valve mean gradient measures 36.7 mmHg. Aortic valve peak gradient measures 55.8 mmHg. Aortic valve  area, by VTI measures 0.56 cm. Pulmonic Valve: The pulmonic valve was normal in structure. Pulmonic valve regurgitation is mild. Aorta: The aortic root is normal in size and structure. There is mild (Grade II) plaque involving the descending aorta. IAS/Shunts: No atrial level shunt detected by color flow Doppler. Additional Comments: Tricuspid aortic valve with reduced cusp excursion; oscillating density on noncoronary cusp concerning for vegetation; possible perforation of noncoronary cusp; severe AS with mean gradient 37 mmHg and AVA 0.6 cm2; severe AI. Spectral Doppler performed. LEFT VENTRICLE PLAX 2D LVOT diam:     2.00 cm LV SV:         43 LV SV Index:   22 LVOT Area:     3.14 cm  AORTIC VALVE AV Area (Vmax):    0.60 cm AV Area (Vmean):   0.57 cm AV Area (VTI):     0.56 cm AV Vmax:           373.33 cm/s AV Vmean:          283.667 cm/s AV VTI:            0.777 m AV Peak Grad:      55.8 mmHg AV Mean Grad:      36.7 mmHg LVOT Vmax:         71.10 cm/s LVOT Vmean:        51.700 cm/s LVOT VTI:          0.138 m LVOT/AV VTI ratio: 0.18 AI PHT:            153 msec  AORTA Ao Root diam: 2.80 cm Ao Asc diam:  3.10 cm MITRAL VALVE             TRICUSPID VALVE MV Area VTI:  2.50 cm   TR Peak grad:   27.7 mmHg MV Peak grad: 3.9 mmHg   TR Vmax:        263.00 cm/s MV Mean grad: 1.0 mmHg MV Vmax:      0.99 m/s   SHUNTS MV Vmean:     50.4 cm/s  Systemic VTI:  0.14 m                          Systemic Diam: 2.00 cm Kirk Ruths MD Electronically signed by Kirk Ruths MD Signature Date/Time: 11/30/2021/1:16:06 PM    Final    DG CHEST PORT 1 VIEW  Result Date: 11/09/2021 CLINICAL DATA:  Shortness of breath. EXAM: PORTABLE CHEST 1 VIEW COMPARISON:  Radiograph yesterday.  CT 10/22/2021 FINDINGS: Bilateral pleural effusions are similar to exam yesterday. Associated bibasilar volume loss typical of atelectasis. Similar vascular congestion. Stable heart size, although partially obscured by adjacent density. No pneumothorax.  IMPRESSION: Unchanged bilateral pleural effusions and vascular congestion. Electronically Signed   By: Keith Rake M.D.   On: 11/09/2021 20:24    Cardiac Studies  TEE 11/22/2021  1. Tricuspid aortic valve with reduced cusp excursion; oscillating  density on noncoronary cusp concerning for vegetation; possible  perforation of noncoronary cusp; severe AS with mean gradient 37 mmHg and  AVA 0.6 cm2; severe AI.   2. Left ventricular ejection fraction, by estimation, is 25 to 30%. The  left ventricle has severely decreased function. The left ventricle  demonstrates global hypokinesis. The left ventricular internal cavity  size  was mildly dilated.   3. Right ventricular systolic function is moderately reduced. The right  ventricular size is normal.   4. Left atrial size was moderately dilated. No left atrial/left atrial  appendage thrombus was detected.   5. Right atrial size was mildly dilated.   6. Small to moderate pericardial effusion.   7. The mitral valve is normal in structure. Mild to moderate mitral valve  regurgitation.   8. The aortic valve is tricuspid. Aortic valve regurgitation is severe.  Severe aortic valve stenosis.   9. There is mild (Grade II) plaque involving the descending aorta.   Patient Profile  Paul Gonzalez is a 56 y.o. male with ESRD on hemodialysis, aortic stenosis, hypertension, diabetes who was admitted to [facility] on 10/10/2021 with sepsis secondary to right toe gangrene.  Course complicated by bacteremia with Bacteroides species.  Cardiology was consulted for severe aortic stenosis/regurgitation with newly reduced ejection fraction of 30%.    Assessment & Plan   #Acute systolic heart failure, EF 20-25% #Severe aortic stenosis/regurgitation #Aortic valve endocarditis -Transesophageal echo confirms he has severe aortic insufficiency due to aortic valve vegetation.  His ejection fraction is 20% on my review.  He has RV dysfunction.  He has  chronic hypotension.  He likely has severe CAD.  His blood pressure at baseline is in the 90s.  Evaluated by surgery who is considering offering him surgical repair.  I believe he is very high risk.  I do not think you will do well with surgery.  He has severe PAD.  He is blind.  He is on dialysis.  We will ask advanced heart failure team to see him to perform left and right heart catheterization for further risk ratification for potential cardiac surgery. -Palliative care following.  Suspect his options will end up be hospice.  We will complete above work-up.  #PAD #Critical limb ischemia status post right PT/SFA angioplasty -Has recently had peripheral intervention.  On aspirin Plavix and statin.  Plavix will need to be held for cardiac surgery.  We will discuss this with vascular surgery.  He will need a left heart catheterization and unclear if he is a candidate for cardiac surgery.  He will need to undergo full evaluation.  #Severe coronary calcifications -We will need left and right heart catheterization.  #ESRD -Per nephrology    For questions or updates, please contact [place] Please consult [email or web address] for contact info under     Signed, [place] T. Audie Box, MD, [place]  12/10/2021 8:44 AM

## 2021-12-04 NOTE — Progress Notes (Signed)
Brief ID Note:   We went by to speak with Ronalee Belts today but he was off the floor.   Noted referral for Heart Failure team for cardiac surgery (sternotomy) risk assessment to replace AV for severe AI.  Discussed with Dr. Aundra Dubin about this -   He is not a candidate for TAVR from my understanding and facing high risk open operation for AVR. He has expressed his desire to me more than once that he wants to keep the left foot intact and avoid amputation.   I worry greatly about any artificial valve in him should he want to pursue foot salvage with medical therapy -- with this constant nidus of infection, he has already struggled with 2 bacteremia's associated with chronic foot infections this year alone (MSSA and now bacteroides fragilis). Discussed vegetation may be more reflection of previous MSSA bacteremia that evolved earlier this year; it would be extremely rare to have b frag endocarditis. We have him on coverage for both organisms at this time with unasyn for this reason.   On the other hand, if he proceeds with amputation first for more definitive source control, would worry about his ability to properly rehab from this and a conventional sternotomy/AVR, but overall would defer that call  to CT surgery. Left foot with only peroneal run off - would also be helpful to discuss with vascular further if he would heal the amputation should he agree to one.  I think should he desire to keep foot with ongoing nidus of infection it would not be much help to place a valve that has a high chance of becoming infected.    Janene Madeira, MSN, NP-C [facility] [email or web address] Pager: [phone] Office: [phone] [facility] Main Line: [place] Communication Welcome

## 2021-12-04 NOTE — Discharge Summary (Signed)
Death Summary  Paul Gonzalez KZL:[id number] DOB: 23-Oct-1965 DOA: 11/02/2021  PCP: Coral Spikes, DO   Admit date: 11-02-21 Date of Death: 11-13-21  Final Diagnoses:  Principal Problem:   Pleural effusion, bilateral Active Problems:   Essential hypertension   Mixed hyperlipidemia   Anemia in CKD (chronic kidney disease)   ESRD (end stage renal disease) on dialysis (HCC)   Elevated brain natriuretic peptide (BNP) level   Sepsis (HCC)   Cellulitis in diabetic foot (HCC)   Gangrene of toe of right foot (HCC)   Lactic acidosis   Iron deficiency anemia   Hypotension   GERD (gastroesophageal reflux disease)   Hypoalbuminemia due to protein-calorie malnutrition (HCC)   Transaminitis   PVD (peripheral vascular disease) ([place])   Severe protein-calorie malnutrition (HCC)      History of present illness:  Brief Narrative:  56 y.o. male with medical history significant of Hypertension, hyperlipidemia, ESRD on HD (MWF), anemia of chronic disease, diabetic retinopathy with vision loss who presents to the emergency department due to worsening shortness of breath that has been ongoing for about 2 weeks, this was associated with nonproductive cough.  He  also complained of bilateral diabetic foot ulcers and states that he has not been able to get an appointment with a podiatrist.   In the emergency department, Right foot x-ray showed no fracture or dislocation of the right foot.  No radiographic findings of osteomyelitis.  Soft tissue wound over the medial great toe.  Soft tissue edema of the forefoot.  Chest x-ray showed cardiomegaly without failure.  Bilateral pleural effusion and associated airspace disease, likely atelectasis, right greater than left.  He was treated with IV ceftriaxone, vancomycin and azithromycin    Orthopedic surgery evaluated and recommended ABIs and also recommended vascular evaluation.    Vascular surgery saw patient in the s/p right SFA and PTA  angioplasty Patient is optimized from vascular surgery standpoint with peroneal only runoff of the left lower extremity Patient was found to have B fragilis bacteremia TTE done which showed bacterial endocarditis and vegetation CT surgery was consulted, patient found to have aortic valve endocarditis CT surgery recommended amputation of left toe before aortic valve replacement     Hospital course         Sepsis due to B fragilis bacteremia/concomitant gangrene right toe with cellulitis -Blood cultures positive for B fragilis, started on IV Zosyn which was changed to IV Unasyn per ID -Patient had aortogram of lower extremitywith lower extremity arteriogram done given that there is tissue loss and he had a right posterior tibial artery angioplasty and right mid to distal SFV drug-coated balloon angioplasty and has been optimized in the right lower extremity after SFA and posterior tibial intervention with inline flow down the right lower extremity with via the posterior tibial; patient was loaded with Plavix and aspirin and statin and they feel that if the right foot wounds fail to heal he would be a candidate for retrograde right AT access  -Orthopedics was consulted for possible amputation , as infected toe was  likely source for persistent bacteremia which could have affected the valve even after surgery     Aortic valve endocarditis -Transesophageal confirmed severe aortic insufficiency due to aortic valve vegetation -CT surgery for surgical AVR despite him being very high risk; but will likely need amputation before surgery for valve -Dr. Sharol Given was reconsulted for surgery   New finding of cardiomyopathy/acute systolic CHF with HFrEF -Echocardiogram showed EF of 20 to 25% with  severely decreased function of left ventricle demonstrating global hypokinesis and left ventricular diastolic parameters consistent with grade 3 diastolic dysfunction -BNP was greater than 4500; cardiology was  consulted -Patient not on GDMT due to hypotension requiring midodrine and ESRD -Volume maintenance per dialysis -CHF team was following    ESRD on hemodialysis -Patient on dialysis MWF -Nephrology was following   Bilateral pleural effusion right more than left -Underwent thoracentesis for right large effusion on 10/30 with 1.5 L fluid removed  At 1815, patient was found to be unresponsive with pulse, SBP was 71/58.  Initially had weak pulse with heart rate in 30s.  He was found to be in PEA arrest, compressions were initially started however it was noted that patient was DNR and was DNR so patient was DNR  Patient expired Time of death 1832           Time: Of death 76  Signed:  Oswald Hillock  [facility] 11/24/2021, 6:52 PM

## 2021-12-04 NOTE — Progress Notes (Signed)
Initial Nutrition Assessment  DOCUMENTATION CODES:   Not applicable  INTERVENTION:  - Continue Glucerna Shake po TID, each supplement provides 220 kcal and 10 grams of protein  NUTRITION DIAGNOSIS:   Increased nutrient needs related to catabolic illness as evidenced by estimated needs.  GOAL:   Patient will meet greater than or equal to 90% of their needs  MONITOR:   PO intake, Supplement acceptance  REASON FOR ASSESSMENT:   Malnutrition Screening Tool    ASSESSMENT:   56 y.o. male admits related to SOB. PMH includes: HTN, HLD, ESRD on HD, anemia of chronic disease, diabetic retinopathy with vision loss. Pt is now receiving medical management related to Sepsis secondary to B Fragilis Bacteremia and concomitant Gangrene Right Toe with Cellulitis.  Meds include: calcitriol, ferrous sulfate, sliding scale insulin, Semglee (12 units), florastor. Labs reviewed: Na low.   Pt was out of room at time of assessment. Per record, pt has been eating 75-100% of his meals since admission. Pt also has Glucerna TID ordered. Pt is likely meeting his needs at this time. RD will continue to monitor.   NUTRITION - FOCUSED PHYSICAL EXAM:  Attempt at follow up when pt is in room.  Diet Order:   Diet Order             Diet renal with fluid restriction Fluid restriction: 1200 mL Fluid; Room service appropriate? Yes; Fluid consistency: Thin  Diet effective now                   EDUCATION NEEDS:   Not appropriate for education at this time  Skin:  Skin Assessment: Skin Integrity Issues: Skin Integrity Issues:: Incisions Incisions: right toe; left foot  Last BM:  13-Nov-2021  Height:   Ht Readings from Last 1 Encounters:  11/17/2021 '5\' 9"'$  (1.753 m)    Weight:   Wt Readings from Last 1 Encounters:  Nov 13, 2021 86.4 kg    Ideal Body Weight:  72.7 kg  BMI:  Body mass index is 28.13 kg/m.  Estimated Nutritional Needs:   Kcal:  9702-6378 kcals  Protein:  100-120 gm  Fluid:   >/= 1.9 L  Thalia Bloodgood, RD, LDN, CNSC.

## 2021-12-04 DEATH — deceased

## 2021-12-10 ENCOUNTER — Ambulatory Visit: Payer: Self-pay | Admitting: Family Medicine

## 2022-01-26 IMAGING — DX DG CHEST 2V
2 series · 2 of 2 positions shown · non-contrast
Comparison: None.

CLINICAL DATA: Table formatting from the original note was not
included. Sore throat, chest congestion, cough since [REDACTED].
cough, chest pain, chest congestion

EXAM:
CHEST - 2 VIEW

[chest pa]
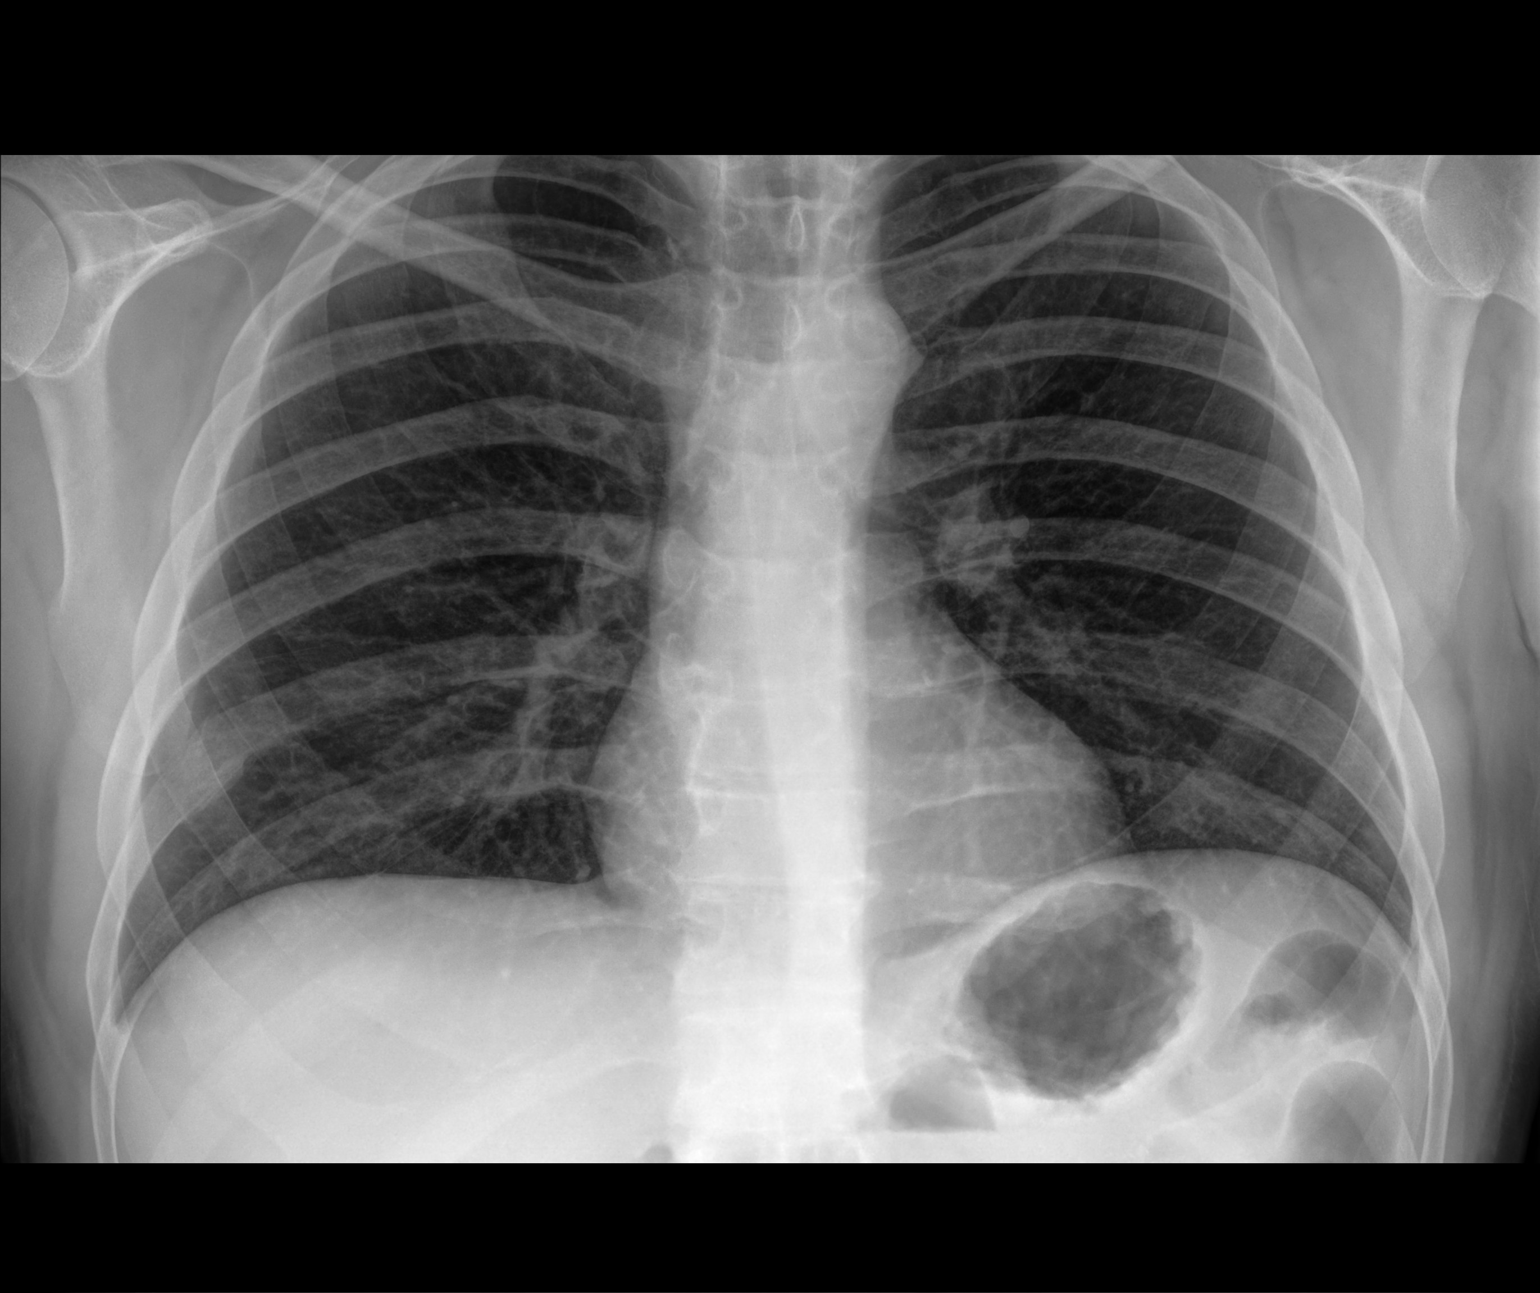

[chest lat]
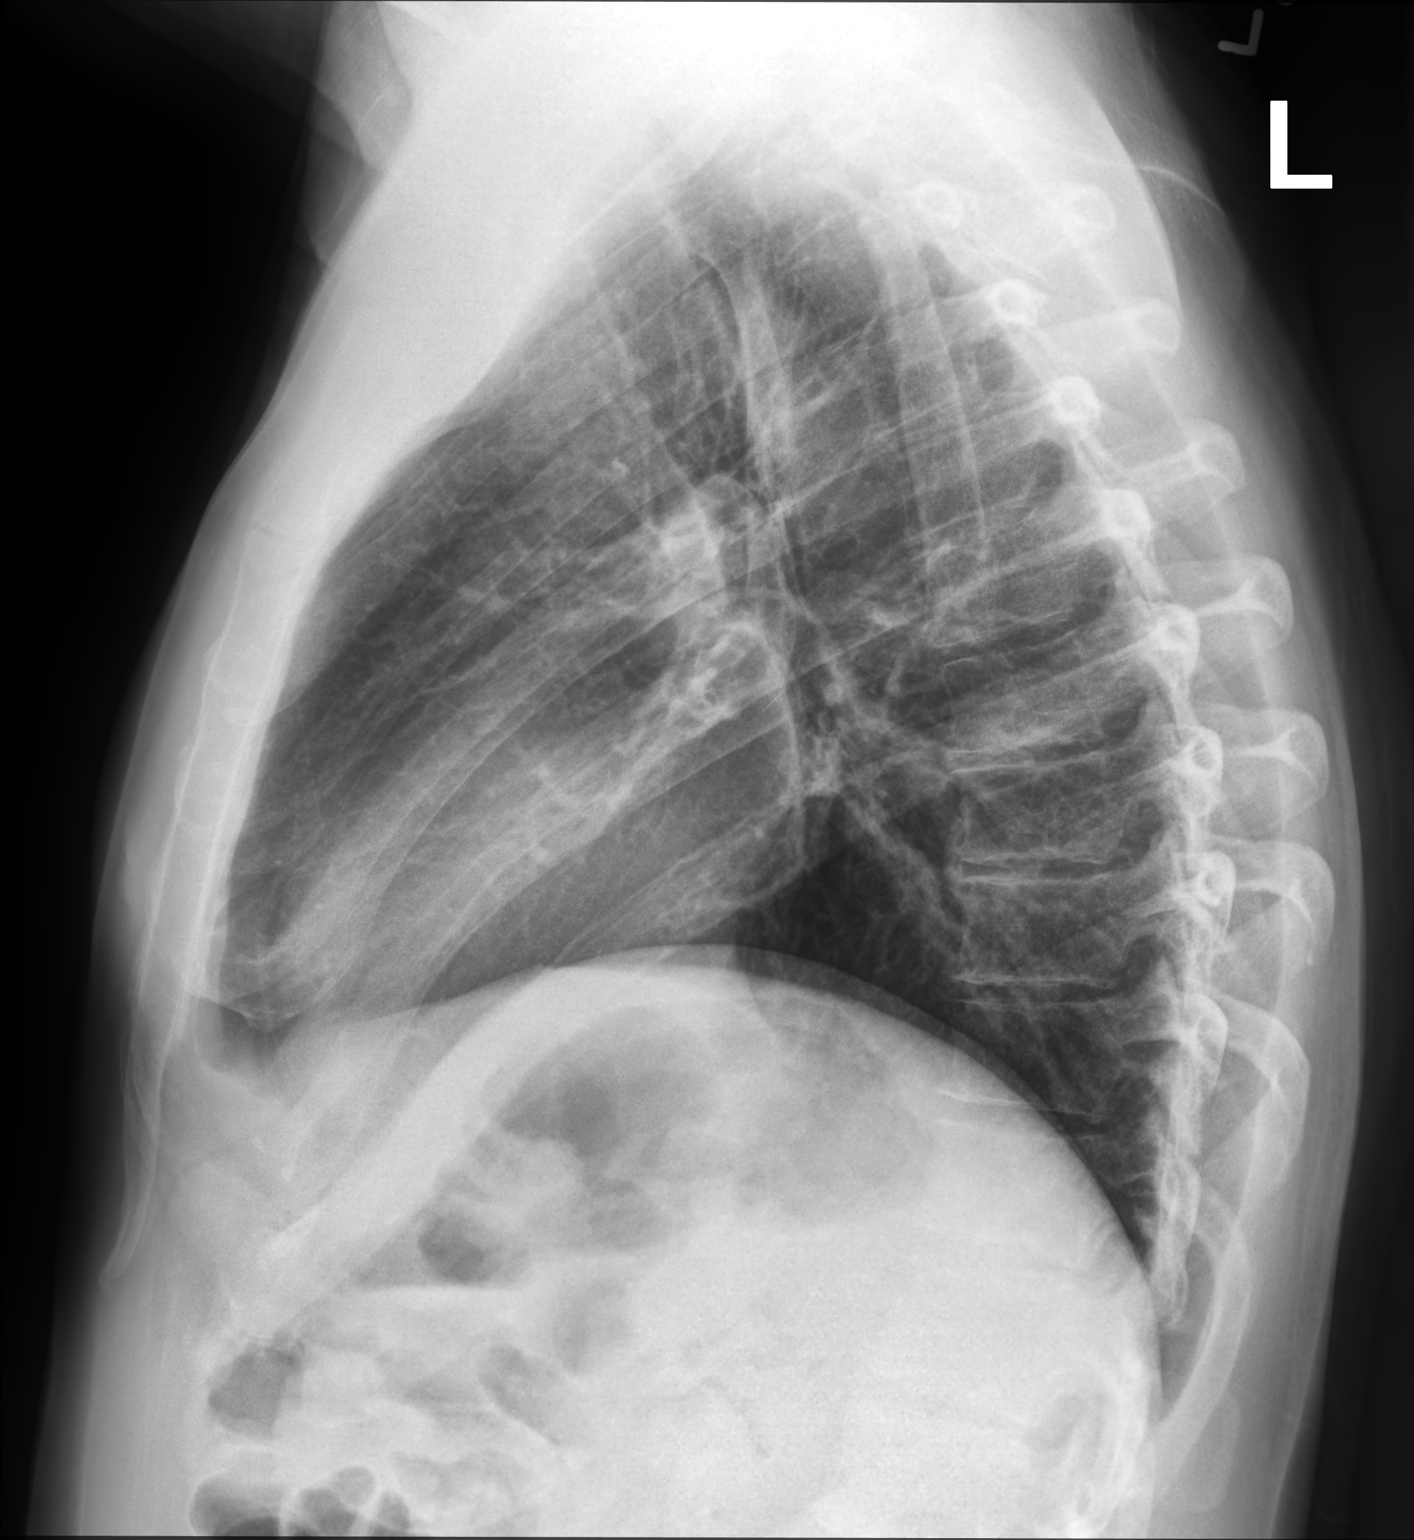

[2 of 2 positions shown; findings below may reference images not displayed]

FINDINGS: Normal mediastinum and cardiac silhouette. Normal pulmonary
vasculature. No evidence of effusion, infiltrate, or pneumothorax.
No acute bony abnormality.
IMPRESSION: No acute cardiopulmonary process.

## 2022-03-18 ENCOUNTER — Other Ambulatory Visit (HOSPITAL_COMMUNITY): Payer: Medicare Other

## 2022-04-28 IMAGING — CT CT ABD-PELV W/O CM
2 of 4 series · 15 of 46 positions shown, 17 images · non-contrast
Comparison: 03/02/2013

CLINICAL DATA: Abdominal pain. Diarrhea and vomiting since
yesterday. On dialysis.



[Series 2: axial st · axial · 0.92mm/px · z∈[+704,+1209]mm · 12 of 117 slices shown, 14 images]
[im 8/117  soft-tissue]
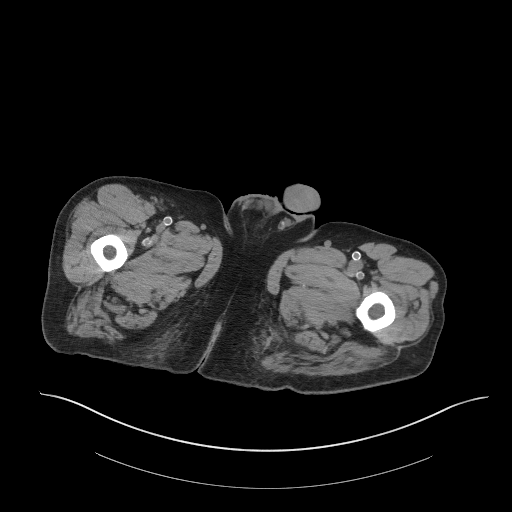
[im 8/117  bone]
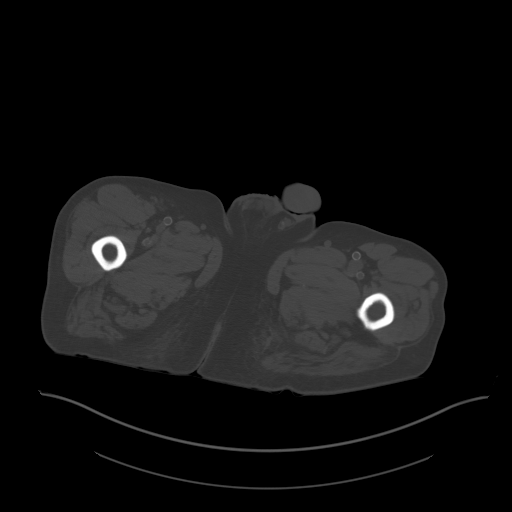
[im 15/117  soft-tissue]
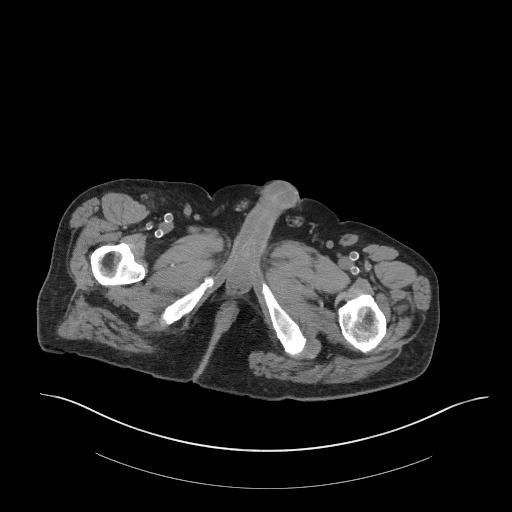
[im 30/117  soft-tissue]
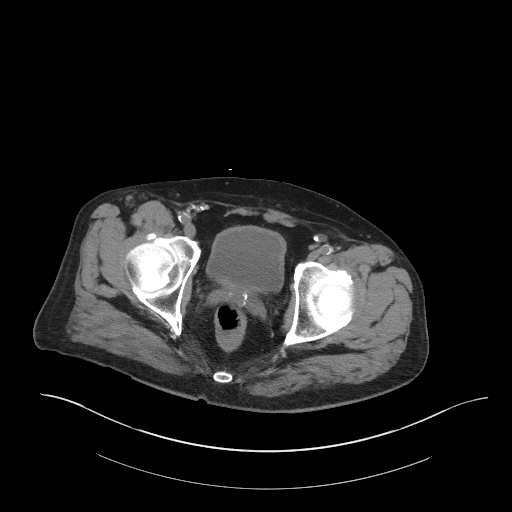
[im 37/117  soft-tissue]
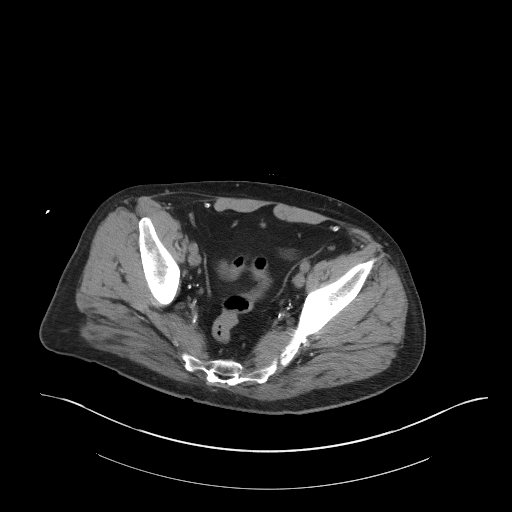
[im 44/117  soft-tissue]
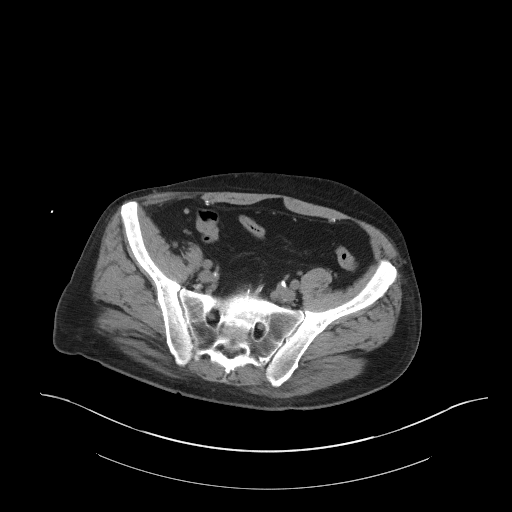
[im 51/117  soft-tissue]
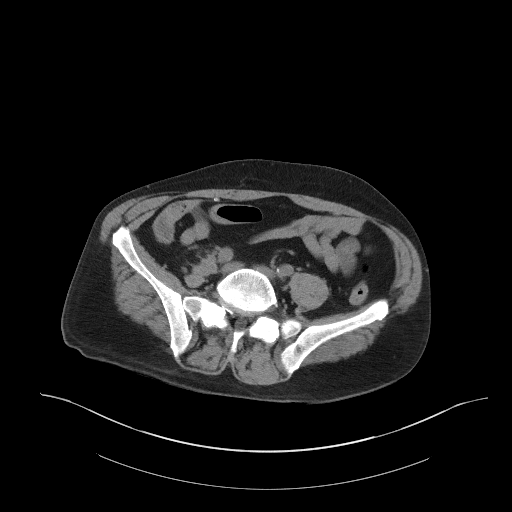
[im 66/117  soft-tissue]
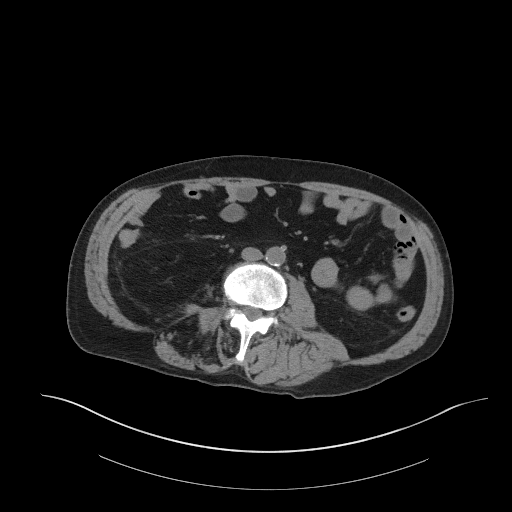
[im 73/117  soft-tissue]
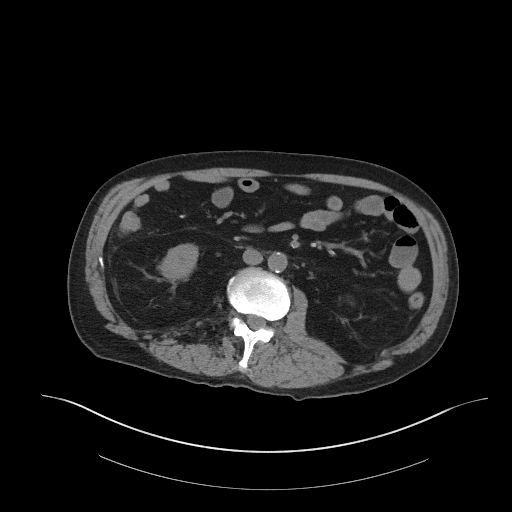
[im 80/117  soft-tissue]
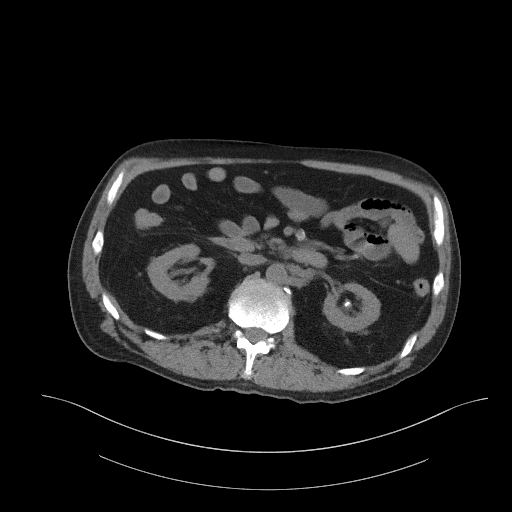
[im 80/117  bone]
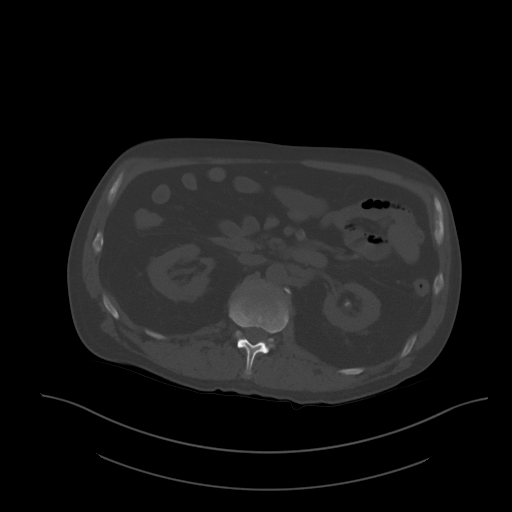
[im 88/117  soft-tissue]
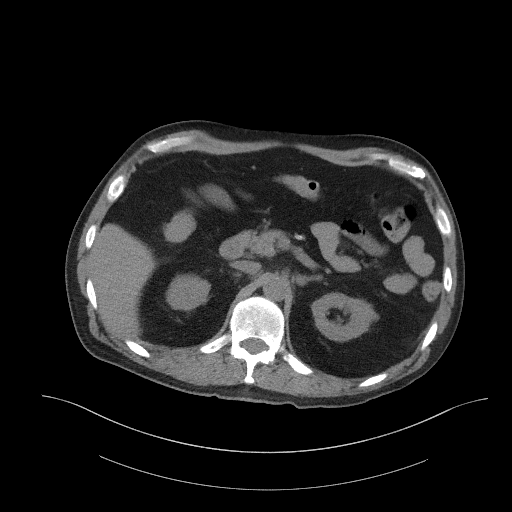
[im 102/117  soft-tissue]
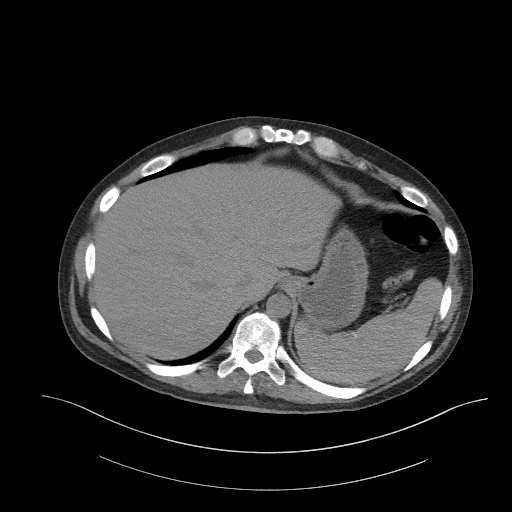
[im 109/117  soft-tissue]
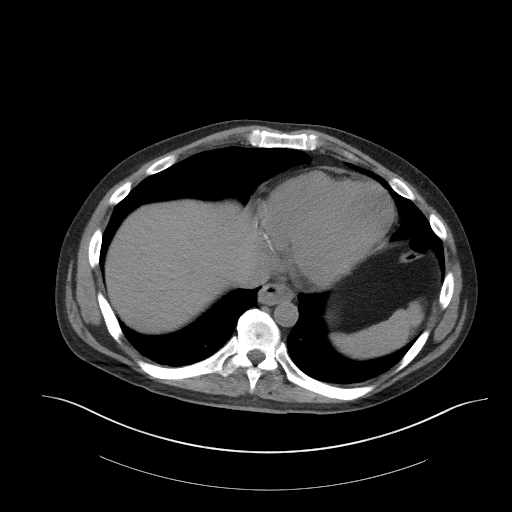

[Series 5: coronal st · coronal · 0.84mm/px · 3 of 107 slices shown]
[im 36/107  soft-tissue]
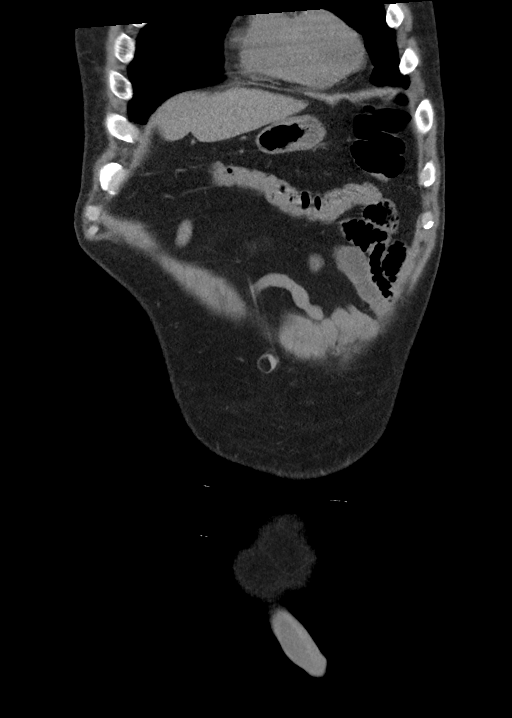
[im 48/107  soft-tissue]
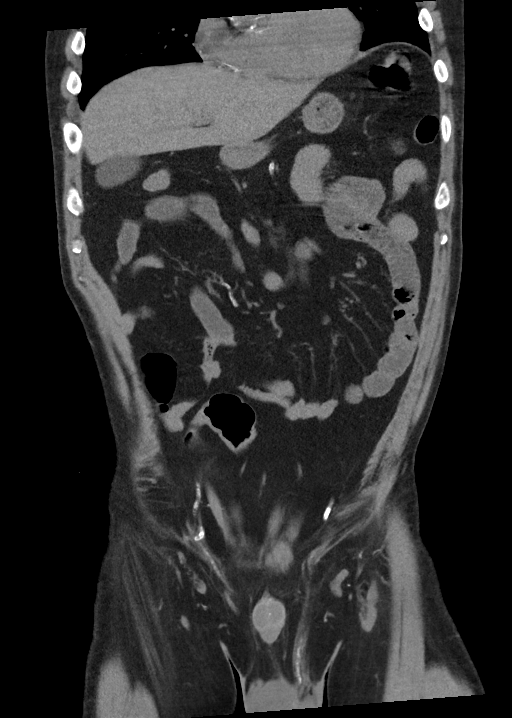
[im 59/107  soft-tissue]
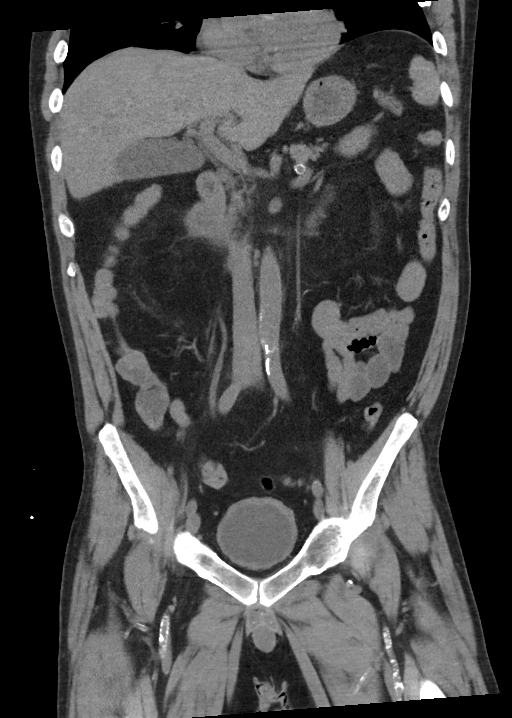

[15 of 46 positions shown; findings below may reference images not displayed]

FINDINGS: Lower chest: Clear lung bases. Normal heart size without pericardial
or pleural effusion. Right coronary artery calcification. Aortic
valve calcification.

Suspect mild distal esophageal wall thickening.

Hepatobiliary: Normal liver. Mild gallbladder distension, without
calcified stone or specific evidence of acute cholecystitis.

Pancreas: Normal, without mass or ductal dilatation.

Spleen: Normal in size, without focal abnormality.

Adrenals/Urinary Tract: Normal adrenal glands. Mild renal cortical
thinning bilaterally. Multiple bilateral renal collecting system
calculi. Maximally approximately 4 mm.

No hydronephrosis. No hydroureter or ureteric calculi. Equivocal
punctate stone at the right ureterovesicular junction on coronal
image 72 and sagittal image 65.

Stomach/Bowel: Normal stomach, without wall thickening. Normal
colon, appendix, and terminal ileum. Normal small bowel.

Vascular/Lymphatic: Aortic atherosclerosis. No abdominopelvic
adenopathy.

Reproductive: Normal prostate.

Other: No significant free fluid.  No free intraperitoneal air.

Musculoskeletal: No acute osseous abnormality.
IMPRESSION: 1.  No acute process in the abdomen or pelvis.
2. Bilateral nephrolithiasis. Equivocal punctate stone at the right
ureterovesicular junction. Correlate with right lower quadrant
symptoms and urinalysis.
3. Coronary artery atherosclerosis. Aortic Atherosclerosis
([id number]).
4. Aortic valvular calcifications. Consider echocardiography to
evaluate for valvular dysfunction.
5. Possible mild esophageal wall thickening as can be seen with
esophagitis.
